# Patient Record
Sex: Female | Born: 1955 | Race: White | Hispanic: No | Smoking: Former smoker
Health system: Southern US, Community
[De-identification: ages and names within clinical notes are randomized; demographics above are authoritative.]

## PROBLEM LIST (undated history)

## (undated) DIAGNOSIS — E079 Disorder of thyroid, unspecified: Secondary | ICD-10-CM

## (undated) DIAGNOSIS — M549 Dorsalgia, unspecified: Secondary | ICD-10-CM

## (undated) DIAGNOSIS — F319 Bipolar disorder, unspecified: Secondary | ICD-10-CM

## (undated) DIAGNOSIS — G2581 Restless legs syndrome: Secondary | ICD-10-CM

## (undated) DIAGNOSIS — F209 Schizophrenia, unspecified: Secondary | ICD-10-CM

## (undated) DIAGNOSIS — T7840XA Allergy, unspecified, initial encounter: Secondary | ICD-10-CM

## (undated) DIAGNOSIS — G8929 Other chronic pain: Secondary | ICD-10-CM

## (undated) DIAGNOSIS — Z9889 Other specified postprocedural states: Secondary | ICD-10-CM

## (undated) DIAGNOSIS — E785 Hyperlipidemia, unspecified: Secondary | ICD-10-CM

## (undated) DIAGNOSIS — G4700 Insomnia, unspecified: Secondary | ICD-10-CM

## (undated) DIAGNOSIS — G709 Myoneural disorder, unspecified: Secondary | ICD-10-CM

## (undated) DIAGNOSIS — R569 Unspecified convulsions: Secondary | ICD-10-CM

## (undated) DIAGNOSIS — F329 Major depressive disorder, single episode, unspecified: Secondary | ICD-10-CM

## (undated) DIAGNOSIS — J189 Pneumonia, unspecified organism: Secondary | ICD-10-CM

## (undated) DIAGNOSIS — I10 Essential (primary) hypertension: Secondary | ICD-10-CM

## (undated) DIAGNOSIS — M1990 Unspecified osteoarthritis, unspecified site: Secondary | ICD-10-CM

## (undated) DIAGNOSIS — R609 Edema, unspecified: Secondary | ICD-10-CM

## (undated) DIAGNOSIS — T4271XA Poisoning by unspecified antiepileptic and sedative-hypnotic drugs, accidental (unintentional), initial encounter: Secondary | ICD-10-CM

## (undated) DIAGNOSIS — F32A Depression, unspecified: Secondary | ICD-10-CM

## (undated) DIAGNOSIS — R0600 Dyspnea, unspecified: Secondary | ICD-10-CM

## (undated) DIAGNOSIS — R112 Nausea with vomiting, unspecified: Secondary | ICD-10-CM

## (undated) DIAGNOSIS — F410 Panic disorder [episodic paroxysmal anxiety] without agoraphobia: Secondary | ICD-10-CM

## (undated) DIAGNOSIS — D649 Anemia, unspecified: Secondary | ICD-10-CM

## (undated) DIAGNOSIS — G4730 Sleep apnea, unspecified: Secondary | ICD-10-CM

## (undated) HISTORY — DX: Poisoning by unspecified antiepileptic and sedative-hypnotic drugs, accidental (unintentional), initial encounter: T42.71XA

## (undated) HISTORY — PX: SPINE SURGERY: SHX786

## (undated) HISTORY — PX: FRACTURE SURGERY: SHX138

## (undated) HISTORY — DX: Hyperlipidemia, unspecified: E78.5

## (undated) HISTORY — DX: Dorsalgia, unspecified: M54.9

## (undated) HISTORY — PX: OTHER SURGICAL HISTORY: SHX169

## (undated) HISTORY — DX: Disorder of thyroid, unspecified: E07.9

## (undated) HISTORY — DX: Edema, unspecified: R60.9

## (undated) HISTORY — DX: Anemia, unspecified: D64.9

## (undated) HISTORY — DX: Morbid (severe) obesity due to excess calories: E66.01

## (undated) HISTORY — DX: Major depressive disorder, single episode, unspecified: F32.9

## (undated) HISTORY — DX: Unspecified convulsions: R56.9

## (undated) HISTORY — PX: MINOR HEMORRHOIDECTOMY: SHX6238

## (undated) HISTORY — DX: Panic disorder (episodic paroxysmal anxiety): F41.0

## (undated) HISTORY — DX: Myoneural disorder, unspecified: G70.9

## (undated) HISTORY — PX: BACK SURGERY: SHX140

## (undated) HISTORY — PX: TUBAL LIGATION: SHX77

## (undated) HISTORY — DX: Restless legs syndrome: G25.81

## (undated) HISTORY — DX: Allergy, unspecified, initial encounter: T78.40XA

## (undated) HISTORY — DX: Essential (primary) hypertension: I10

## (undated) HISTORY — DX: Insomnia, unspecified: G47.00

## (undated) HISTORY — DX: Other chronic pain: G89.29

## (undated) HISTORY — DX: Depression, unspecified: F32.A

---

## 1998-07-31 ENCOUNTER — Other Ambulatory Visit: Admission: RE | Admit: 1998-07-31 | Discharge: 1998-07-31 | Payer: Self-pay

## 2001-03-08 ENCOUNTER — Encounter: Payer: Self-pay | Admitting: Emergency Medicine

## 2001-03-08 ENCOUNTER — Emergency Department (HOSPITAL_COMMUNITY): Admission: EM | Admit: 2001-03-08 | Discharge: 2001-03-08 | Payer: Self-pay | Admitting: Emergency Medicine

## 2001-07-07 ENCOUNTER — Other Ambulatory Visit: Admission: RE | Admit: 2001-07-07 | Discharge: 2001-07-07 | Payer: Self-pay | Admitting: Family Medicine

## 2002-11-16 ENCOUNTER — Other Ambulatory Visit: Admission: RE | Admit: 2002-11-16 | Discharge: 2002-11-16 | Payer: Self-pay | Admitting: Family Medicine

## 2003-11-29 ENCOUNTER — Other Ambulatory Visit: Admission: RE | Admit: 2003-11-29 | Discharge: 2003-11-29 | Payer: Self-pay | Admitting: Family Medicine

## 2004-02-08 ENCOUNTER — Ambulatory Visit (HOSPITAL_COMMUNITY): Admission: RE | Admit: 2004-02-08 | Discharge: 2004-02-08 | Payer: Self-pay | Admitting: Gastroenterology

## 2004-02-08 ENCOUNTER — Encounter (INDEPENDENT_AMBULATORY_CARE_PROVIDER_SITE_OTHER): Payer: Self-pay | Admitting: *Deleted

## 2004-12-25 ENCOUNTER — Ambulatory Visit (HOSPITAL_COMMUNITY): Admission: RE | Admit: 2004-12-25 | Discharge: 2004-12-25 | Payer: Self-pay | Admitting: Family Medicine

## 2005-03-26 ENCOUNTER — Encounter (HOSPITAL_COMMUNITY): Admission: RE | Admit: 2005-03-26 | Discharge: 2005-04-25 | Payer: Self-pay | Admitting: Family Medicine

## 2005-04-02 ENCOUNTER — Other Ambulatory Visit: Admission: RE | Admit: 2005-04-02 | Discharge: 2005-04-02 | Payer: Self-pay | Admitting: Family Medicine

## 2005-06-12 ENCOUNTER — Ambulatory Visit: Payer: Self-pay | Admitting: Cardiology

## 2005-06-20 ENCOUNTER — Ambulatory Visit: Payer: Self-pay | Admitting: Cardiology

## 2006-01-17 ENCOUNTER — Ambulatory Visit: Payer: Self-pay | Admitting: Cardiology

## 2006-02-07 ENCOUNTER — Ambulatory Visit: Payer: Self-pay | Admitting: Cardiology

## 2006-05-27 ENCOUNTER — Ambulatory Visit: Payer: Self-pay

## 2006-05-27 ENCOUNTER — Ambulatory Visit: Payer: Self-pay | Admitting: Nurse Practitioner

## 2006-05-27 ENCOUNTER — Encounter: Payer: Self-pay | Admitting: Cardiology

## 2006-07-21 ENCOUNTER — Ambulatory Visit: Payer: Self-pay | Admitting: Oncology

## 2008-10-13 ENCOUNTER — Emergency Department (HOSPITAL_COMMUNITY): Admission: EM | Admit: 2008-10-13 | Discharge: 2008-10-13 | Payer: Self-pay | Admitting: Emergency Medicine

## 2008-10-17 ENCOUNTER — Ambulatory Visit (HOSPITAL_COMMUNITY): Admission: RE | Admit: 2008-10-17 | Discharge: 2008-10-17 | Payer: Self-pay | Admitting: Family Medicine

## 2008-10-21 ENCOUNTER — Ambulatory Visit (HOSPITAL_COMMUNITY): Admission: RE | Admit: 2008-10-21 | Discharge: 2008-10-21 | Payer: Self-pay | Admitting: Family Medicine

## 2008-10-21 ENCOUNTER — Encounter (INDEPENDENT_AMBULATORY_CARE_PROVIDER_SITE_OTHER): Payer: Self-pay | Admitting: Diagnostic Radiology

## 2008-10-26 ENCOUNTER — Ambulatory Visit (HOSPITAL_COMMUNITY): Admission: RE | Admit: 2008-10-26 | Discharge: 2008-10-26 | Payer: Self-pay | Admitting: Family Medicine

## 2008-11-16 ENCOUNTER — Encounter: Admission: RE | Admit: 2008-11-16 | Discharge: 2008-11-16 | Payer: Self-pay | Admitting: Neurosurgery

## 2009-02-04 HISTORY — PX: COLONOSCOPY: SHX174

## 2009-04-11 ENCOUNTER — Encounter (INDEPENDENT_AMBULATORY_CARE_PROVIDER_SITE_OTHER): Payer: Self-pay | Admitting: *Deleted

## 2009-06-29 ENCOUNTER — Ambulatory Visit: Payer: Self-pay | Admitting: Gastroenterology

## 2009-06-29 ENCOUNTER — Encounter (INDEPENDENT_AMBULATORY_CARE_PROVIDER_SITE_OTHER): Payer: Self-pay | Admitting: *Deleted

## 2009-06-29 DIAGNOSIS — R197 Diarrhea, unspecified: Secondary | ICD-10-CM

## 2009-06-29 DIAGNOSIS — E669 Obesity, unspecified: Secondary | ICD-10-CM

## 2009-06-29 DIAGNOSIS — Z860100 Personal history of colon polyps, unspecified: Secondary | ICD-10-CM | POA: Insufficient documentation

## 2009-07-05 HISTORY — PX: OTHER SURGICAL HISTORY: SHX169

## 2009-07-06 DIAGNOSIS — D519 Vitamin B12 deficiency anemia, unspecified: Secondary | ICD-10-CM

## 2009-07-06 LAB — CONVERTED CEMR LAB
ALT: 38 units/L — ABNORMAL HIGH (ref 0–35)
AST: 44 units/L — ABNORMAL HIGH (ref 0–37)
BUN: 20 mg/dL (ref 6–23)
Basophils Absolute: 0 10*3/uL (ref 0.0–0.1)
Basophils Relative: 0.7 % (ref 0.0–3.0)
Bilirubin, Direct: 0.1 mg/dL (ref 0.0–0.3)
Chloride: 104 meq/L (ref 96–112)
Creatinine, Ser: 0.6 mg/dL (ref 0.4–1.2)
Eosinophils Absolute: 0.1 10*3/uL (ref 0.0–0.7)
Folate: 5.7 ng/mL
GFR calc non Af Amer: 110.12 mL/min (ref >60)
Iron: 108 ug/dL (ref 42–145)
Lymphocytes Relative: 33.7 % (ref 12.0–46.0)
MCHC: 34.2 g/dL (ref 30.0–36.0)
MCV: 91.5 fL (ref 78.0–100.0)
Monocytes Absolute: 0.4 10*3/uL (ref 0.1–1.0)
Neutrophils Relative %: 56.1 % (ref 43.0–77.0)
Potassium: 3.9 meq/L (ref 3.5–5.1)
RDW: 13.3 % (ref 11.5–14.6)
TSH: 1.44 microintl units/mL (ref 0.35–5.50)
Total Bilirubin: 0.6 mg/dL (ref 0.3–1.2)
Transferrin: 226.3 mg/dL (ref 212.0–360.0)
Vitamin B-12: 126 pg/mL — ABNORMAL LOW (ref 211–911)

## 2009-07-07 ENCOUNTER — Telehealth (INDEPENDENT_AMBULATORY_CARE_PROVIDER_SITE_OTHER): Payer: Self-pay | Admitting: *Deleted

## 2009-07-10 ENCOUNTER — Telehealth: Payer: Self-pay | Admitting: Gastroenterology

## 2009-07-12 ENCOUNTER — Ambulatory Visit: Payer: Self-pay | Admitting: Gastroenterology

## 2009-07-14 ENCOUNTER — Encounter: Payer: Self-pay | Admitting: Gastroenterology

## 2010-03-06 NOTE — Procedures (Signed)
Summary: Colonoscopy  Patient: Tamara Mccullough Note: All result statuses are Final unless otherwise noted.  Tests: (1) Colonoscopy (COL)   COL Colonoscopy           DONE     [facility]     [address]     [place], [place]  [address]           COLONOSCOPY PROCEDURE REPORT           PATIENT:  [name], [name]  MR#:  [id number]     BIRTHDATE:  [birth year], 53 yrs. old  GENDER:  female     ENDOSCOPIST:  Vania Rea. Jarold Motto, MD, [facility]     REF. BY:  Paulita Cradle, N.P.     PROCEDURE DATE:  07/12/2009     PROCEDURE:  Colonoscopy with biopsy     ASA CLASS:  Class II     INDICATIONS:  surveillance, history of polyps     MEDICATIONS:   Fentanyl 100 mcg IV, Versed 10 mg IV           DESCRIPTION OF PROCEDURE:   After the risks benefits and     alternatives of the procedure were thoroughly explained, informed     consent was obtained.  Digital rectal exam was performed and     revealed no abnormalities.   The [id number] endoscope was     introduced through the anus and advanced to the cecum, which was     identified by both the appendix and ileocecal valve, without     limitations.  The quality of the prep was excellent, using     MoviPrep.  The instrument was then slowly withdrawn as the colon     was fully examined.     <<PROCEDUREIMAGES>>           FINDINGS:  A diminutive polyp was found in the rectum. COLD BIOPSY     REMOVED.  This was otherwise a normal examination of the colon.     Retroflexed views in the rectum revealed no abnormalities.    The     scope was then withdrawn from the patient and the procedure     completed.           COMPLICATIONS:  None     ENDOSCOPIC IMPRESSION:     1) Diminutive polyp in the rectum     2) Otherwise normal examination     R/O ADENOMA.     RECOMMENDATIONS:     1) Follow up colonoscopy in 5 years     REPEAT EXAM:  No           ______________________________     Vania Rea. Jarold Motto, MD, [facility]           CC:           n.  eSIGNED:   Vania Rea. Patterson at 07/12/2009 09:57 AM           Raynelle Fanning, [id number]  Note: An exclamation mark (!) indicates a result that was not dispersed into the flowsheet. Document Creation Date: 07/12/2009 9:58 AM _______________________________________________________________________  (1) Order result status: Final Collection or observation date-time: 07/12/2009 09:50 Requested date-time:  Receipt date-time:  Reported date-time:  Referring Physician:   Ordering Physician: Sheryn Bison [phone]) Specimen Source:  Source: Launa Grill Order Number: [id number] Lab site:   Appended Document: Colonoscopy followup colonoscopy in 5 years  Appended Document: Colonoscopy     Procedures Next Due Date:    Colonoscopy: 07/2014

## 2010-03-06 NOTE — Progress Notes (Signed)
Summary: Forgot to do Colon Health  Phone Note Call from Patient Call back at Home Phone [phone]   Call For: Dr Jarold Motto Reason for Call: Talk to Nurse Summary of Call: Forgot to do the [facility] colon health 2 times a day is that ok to still have Colon wed 07-12-09.   Initial call taken by: Leanor Kail [facility],  July 10, 2009 11:36 AM  Follow-up for Phone Call        Pt informed that it is OK to have colonoscopy.  She can begin [facility] after proc is done. Follow-up by: Ashok Cordia RN,  July 10, 2009 11:50 AM

## 2010-03-06 NOTE — Assessment & Plan Note (Signed)
Summary: CHANGE IN BM...AS.   History of Present Illness Visit Type: Initial Visit Primary GI MD: Sheryn Bison MD FACP FAGA Primary Provider: Paulita Cradle, MD Requesting Provider: Rudi Heap, MD Chief Complaint: changes in bowels  alternating constipation and diarrhea History of Present Illness:   Pleasant 55 year old Caucasian female with a long history of morbid obesity and recurrent colon polyps. 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 These records are not available for review. She has lost 190 pounds in weight, apparently is stable at this point. 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 She is a very irregular diet with lots of junk food, but denies any specific food intolerances at this time.  Her main complaint today is one of constipation requiring glycerin suppository use. She's had no rectal bleeding, melena, or hepatobiliary complaints. She does not think she had cholecystectomy. She denies a systemic complaints such as fever, chills, or any evidence of nutritional deficiencies, and is on multiple vitamins. She denies reflux symptoms, dysphagia, or history of pancreatitis, but apparently has fatty liver and elevated hepatic enzymes. She also suffers from hypertension, insomnia, and chronic pain syndrome.   GI Review of Systems      Denies abdominal pain, acid reflux, belching, bloating, chest pain, dysphagia with liquids, dysphagia with solids, heartburn, loss of appetite, nausea, vomiting, vomiting blood, weight loss, and  weight gain.      Reports change in bowel habits, constipation, and  diarrhea.     Denies anal fissure, black tarry stools, diverticulosis, fecal incontinence, heme positive stool, hemorrhoids, irritable bowel syndrome, jaundice, light color stool, liver problems, rectal bleeding, and  rectal pain. Preventive Screening-Counseling &  Management  Alcohol-Tobacco     Smoking Status: quit      Drug Use:  no.      Current Medications (verified): 1)  Fluoxetine Hcl 20 Mg Caps (Fluoxetine Hcl) .Marland Kitchen.. 1 By Mouth Once Daily 2)  Hydroxyzine Pamoate 50 Mg Caps (Hydroxyzine Pamoate) .... 2 By Mouth At Bedtime 3)  Zolpidem Tartrate 10 Mg Tabs (Zolpidem Tartrate) .Marland Kitchen.. 1 By Mouth At Bedtime 4)  Lisinopril-Hydrochlorothiazide 10-12.5 Mg Tabs (Lisinopril-Hydrochlorothiazide) .Marland Kitchen.. 1 By Mouth Once Daily 5)  Daily Multiple Vitamins  Tabs (Multiple Vitamin) .Marland Kitchen.. 1 By Mouth Once Daily 6)  Cal-Citrate 150 Mg Caps (Calcium Citrate) .Marland Kitchen.. 1 By Mouth Three Times A Day 7)  Tramadol Hcl 50 Mg Tabs (Tramadol Hcl) .Marland Kitchen.. 1 By Mouth Three Times A Day  Allergies (verified): 1)  ! * Alcohol 2)  ! Codeine  Past History:  Past medical, surgical, family and social histories (including risk factors) reviewed for relevance to current acute and chronic problems.  Past Medical History: Hypertension Chronic Back Pain Hypoglycemia Alcoholism Anxiety Disorder Asthma Adenomatous Colon Polyps Depression Obesity Sleep Apnea  Past Surgical History: Reviewed history from 06/26/2009 and no changes required. Back Surgery Gastric Bypass Tubal Ligation Hemorrhoidectomy  Family History: Reviewed history from 06/26/2009 and no changes required. Family History of Heart Disease: father Family History of Diabetes:  father No FH of Colon Cancer:  Social History: Reviewed history from 06/26/2009 and no changes required. Alcohol Use - no Illicit Drug Use - no Patient is a former smoker.  Retired  Divorced 1 girl Daily Caffeine Use Smoking Status:  quit  Review of Systems       The patient complains of anxiety-new, back pain, depression-new, fatigue, itching, night sweats, and sleeping problems.  The patient denies  allergy/sinus, anemia, arthritis/joint pain, blood in urine, breast changes/lumps, change in vision, confusion, cough, coughing up  blood, fever, headaches-new, hearing problems, heart murmur, heart rhythm changes, menstrual pain, muscle pains/cramps, nosebleeds, pregnancy symptoms, shortness of breath, skin rash, sore throat, swelling of feet/legs, swollen lymph glands, thirst - excessive , urination - excessive , urination changes/pain, urine leakage, vision changes, and voice change.   General:  Complains of fatigue and sleep disorder. Eyes:  Denies blurring, diplopia, irritation, discharge, vision loss, scotoma, eye pain, and photophobia. ENT:  Denies earache, ear discharge, tinnitus, decreased hearing, nasal congestion, loss of smell, nosebleeds, sore throat, hoarseness, and difficulty swallowing. CV:  Denies chest pains, angina, palpitations, syncope, dyspnea on exertion, orthopnea, PND, peripheral edema, and claudication. Resp:  Complains of dyspnea with exercise; denies dyspnea at rest, cough, sputum, wheezing, coughing up blood, and pleurisy. GI:  Complains of gas/bloating, diarrhea, and constipation; denies difficulty swallowing, pain on swallowing, nausea, indigestion/heartburn, vomiting, vomiting blood, abdominal pain, jaundice, change in bowel habits, bloody BM's, black BMs, and fecal incontinence. GU:  Denies urinary burning, blood in urine, nocturnal urination, urinary frequency, urinary incontinence, abnormal vaginal bleeding, amenorrhea, menorrhagia, vaginal discharge, pelvic pain, genital sores, painful intercourse, and decreased libido. MS:  Complains of low back pain; denies joint pain / LOM, joint swelling, joint stiffness, joint deformity, muscle weakness, muscle cramps, muscle atrophy, leg pain at night, leg pain with exertion, and shoulder pain / LOM hand / wrist pain (CTS). Derm:  Complains of itching; denies rash, dry skin, hives, moles, warts, and unhealing ulcers. Neuro:  Denies weakness, paralysis, abnormal sensation, seizures, syncope, tremors, vertigo, transient blindness, frequent falls, frequent  headaches, difficulty walking, headache, sciatica, radiculopathy other:, restless legs, memory loss, and confusion.  Vital Signs:  Patient profile:   55 year old female Height:      65 inches Weight:      196 pounds BMI:     32.73 Pulse rate:   60 / minute Pulse rhythm:   regular BP sitting:   102 / 70  (left arm)  Vitals Entered By: Milford Cage NCMA (Jun 29, 2009 9:26 AM)  Physical Exam  General:  Well developed, well nourished, no acute distress.obese.   Head:  Normocephalic and atraumatic. Eyes:  PERRLA, no icterus.exam deferred to patient's ophthalmologist.   Neck:  Supple; no masses or thyromegaly. Lungs:  Clear throughout to auscultation. Heart:  Regular rate and rhythm; no murmurs, rubs,  or bruits. Abdomen:  Soft, nontender and nondistended. No masses, hepatosplenomegaly or hernias noted. Normal bowel sounds. Rectal:  Normal exam.hemocult negative.   Msk:  Symmetrical with no gross deformities. Normal posture. Extremities:  No clubbing, cyanosis, edema or deformities noted. Neurologic:  Alert and  oriented x4;  grossly normal neurologically. Cervical Nodes:  No significant cervical adenopathy. Psych:  Alert and cooperative. Normal mood and affect.   Impression & Recommendations:  Problem # 1:  DIARRHEA (ICD-787.91) Assessment Unchanged Nonspecific bowel irregularity and obese patient status post gastric bypass and Roux-en-Y biliary-pancreatic diversion. Obviously her gut absorption and digestion is very complex related to these anatomical changes. Actually, I think she is doing extremely well at this point and all that she needs is more fiber in her diet which I have prescribed p.r.n. MiraLax use. Some screening lab parameters have been ordered and we will schedule her for followup colonoscopy because of her history of colon polyps. She appears well-nourished at this time and apparently is on multivitamins which have asked her to bring for review. Orders: TLB-CBC  Platelet - w/Differential (85025-CBCD) TLB-BMP (Basic Metabolic Panel-BMET) (80048-METABOL) TLB-Hepatic/Liver Function Pnl (80076-HEPATIC) TLB-TSH (Thyroid Stimulating Hormone) (84443-TSH) TLB-B12, Serum-Total ONLY (52841-L24) TLB-Ferritin (82728-FER) TLB-Folic Acid (Folate) (82746-FOL) TLB-IBC Pnl (Iron/FE;Transferrin) (83550-IBC) TLB-Magnesium (Mg) (83735-MG) T- * Misc. Laboratory test [id number])  Problem # 2:  OBESITY (ICD-278.00) Assessment: Improved Liver functions ordered to assess possible Nash syndrome.  Problem # 3:  PERSONAL HX COLONIC POLYPS (ICD-V12.72) Assessment: Unchanged Colonoscopy scheduled her convenience.  Patient Instructions: 1)  You are scheduled for a colonoscopy. 2)  Begin [facility] two times a day. 3)  Begin Miralax as needed. 4)  Please go to the basement for lab work. 5)  The medication list was reviewed and reconciled.  All changed / newly prescribed medications were explained.  A complete medication list was provided to the patient / caregiver. 6)  Copy sent to : Albertina Senegal 7)  Please continue current medications.  8)  Constipation and Hemorrhoids brochure given.  9)  Colonoscopy and Flexible Sigmoidoscopy brochure given.  10)  Conscious Sedation brochure given.   Appended Document: CHANGE IN BM...AS.    Clinical Lists Changes  Medications: Added new medication of MOVIPREP 100 GM  SOLR (PEG-KCL-NACL-NASULF-NA ASC-C) As per prep instructions. - Signed Added new medication of PHILLIPS COLON HEALTH  CAPS (PROBIOTIC PRODUCT) two times a day Added new medication of MIRALAX   POWD (POLYETHYLENE GLYCOL 3350) daily Rx of MOVIPREP 100 GM  SOLR (PEG-KCL-NACL-NASULF-NA ASC-C) As per prep instructions.;  #1 x 0;  Signed;  Entered by: Ashok Cordia RN;  Authorized by: Mardella Layman MD [facility];  Method used: Print then Give to Patient Orders: Added new Test order of Colonoscopy (Colon) - Signed    Prescriptions: MOVIPREP 100 GM  SOLR  (PEG-KCL-NACL-NASULF-NA ASC-C) As per prep instructions.  #1 x 0   Entered by:   Ashok Cordia RN   Authorized by:   Mardella Layman MD [facility]   Signed by:   Ashok Cordia RN on 06/29/2009   Method used:   Print then Give to Patient   RxID:   [id number]    Appended Document: CHANGE IN BM...AS.    Clinical Lists Changes  Medications: Rx of MOVIPREP 100 GM  SOLR (PEG-KCL-NACL-NASULF-NA ASC-C) As per prep instructions.;  #1 x 0;  Signed;  Entered by: Ashok Cordia RN;  Authorized by: Mardella Layman MD [facility];  Method used: Faxed to [facility], [phone] [place], [place], [place]  [address], Ph: [phone] or [phone], Fax: [phone]    Prescriptions: MOVIPREP 100 GM  SOLR (PEG-KCL-NACL-NASULF-NA ASC-C) As per prep instructions.  #1 x 0   Entered by:   Ashok Cordia RN   Authorized by:   Mardella Layman MD [facility]   Signed by:   Ashok Cordia RN on 06/29/2009   Method used:   Faxed to ...       Hospital doctor (retail)       [address]       [place], [place]  [address]       Ph: [phone] or [phone]       Fax: [phone]   RxID:   [id number]    Appended Document: CHANGE IN BM...AS. please ask her to come in and check serum copper level. Other labs were unremarkable.  Appended Document: CHANGE IN BM...AS. No answer.  See message re B12.    Appended Document: CHANGE IN BM...AS. Pt notified.  Will come by next week for lab.

## 2010-03-06 NOTE — Letter (Signed)
Summary: New Patient letter  [facility]  [address] [place], [place] [address]   Phone: [phone]  Fax: [phone]       04/11/2009 MRN: [id number]  [facility] [address] [place], [place]  [address]  Dear Ms. Mustard,  Welcome to the Gastroenterology Division at [facility].    You are scheduled to see Dr.  Jarold Motto on 05/09/2009 at 10:00AM on the 3rd floor at [facility], [address]  We ask that you try to arrive at our office 15 minutes prior to your appointment time to allow for check-in.  We would like you to complete the enclosed self-administered evaluation form prior to your visit and bring it with you on the day of your appointment.  We will review it with you.  Also, please bring a complete list of all your medications or, if you prefer, bring the medication bottles and we will list them.  Please bring your insurance card so that we may make a copy of it.  If your insurance requires a referral to see a specialist, please bring your referral form from your primary care physician.  Co-payments are due at the time of your visit and may be paid by cash, check or credit card.     Your office visit will consist of a consult with your physician (includes a physical exam), any laboratory testing he/she may order, scheduling of any necessary diagnostic testing (e.g. x-ray, ultrasound, CT-scan), and scheduling of a procedure (e.g. Endoscopy, Colonoscopy) if required.  Please allow enough time on your schedule to allow for any/all of these possibilities.    If you cannot keep your appointment, please call [phone] to cancel or reschedule prior to your appointment date.  This allows [place] the opportunity to schedule an appointment for another patient in need of care.  If you do not cancel or reschedule by 5 p.m. the business day prior to your appointment date, you will be charged a $50.00 late cancellation/no-show fee.    Thank you for choosing  [facility] for your medical needs.  We appreciate the opportunity to care for you.  Please visit [place] at our website  to learn more about our practice.                     Sincerely,                                                             The Gastroenterology Division

## 2010-03-06 NOTE — Procedures (Signed)
Summary: Colon   Colonoscopy  Procedure date:  02/08/2004  Findings:      Location:  [facility].   NAME:  Tamara Mccullough, Tamara Mccullough             ACCOUNT NO.:  [id number]   MEDICAL RECORD NO.:  [id number]          PATIENT TYPE:  AMB   LOCATION:  ENDO                         FACILITY[facility]  [facility]   PHYSICIAN:  John C. Madilyn Fireman, M.D.    DATE OF BIRTH:  [birth year]   DATE OF PROCEDURE:  02/08/2004  DATE OF DISCHARGE:                                 OPERATIVE REPORT   PROCEDURE:  Colonoscopy.   INDICATIONS FOR PROCEDURE:  Average risk colon cancer screening.   DESCRIPTION OF PROCEDURE:  The patient was placed in the left lateral  decubitus position and placed on the pulse monitor with continuous low-flow  oxygen delivered by nasal cannula.  She was sedated with 125 mcg IV fentanyl  and 10 mg IV Versed and 12.5 mg IV Phenergan.  The Olympus video colonoscope  was inserted into the rectum and advanced to the cecum, confirmed by  transillumination of McBurney's point and visualization of the ileocecal  valve and appendiceal orifice.  Prep was suboptimal.  I could not rule out  small lesions of less than 1 cm in all areas.  Otherwise the cecum,  ascending, transverse, descending, and sigmoid colon all appeared normal  with no masses, polyps, diverticula, or other mucosal abnormalities.  The  rectum likewise appeared normal and retroflexed view of the anus revealed  small internal hemorrhoids.  The scope was then withdrawn and the patient  returned to the recovery room in stable condition.  She tolerated the  procedure well and there were no immediate complications.   IMPRESSION:  Internal hemorrhoids. Otherwise normal colonoscopy.   PLAN:  The next colon screening by sigmoidoscopy in five years.      J[facility]/MEDQ  D:  02/08/2004  T:  02/08/2004  Job:  [id number]   cc:   Ernestina Penna, M.D.  [address] [place]  [place] [address]  Fax: [phone]   This report was created from the  original endoscopy report, which was reviewed and signed by the above listed endoscopist.

## 2010-03-06 NOTE — Progress Notes (Signed)
Summary: B12 injections  Phone Note Call from Patient   Caller: Patient Summary of Call: Pt calling.  Would like to get B12 shots done at [facility],  Dr. Gayleen Orem office.  This is closer to her. Initial call taken by: Ashok Cordia RN,  July 07, 2009 12:11 PM  Follow-up for Phone Call        Note faxed to [facility] re B12 injections.  Pt will have lab drawn when she comes in for procedure. Follow-up by: Ashok Cordia RN,  July 07, 2009 12:53 PM     Appended Document: B12 injections SHE CAN GET HER B12 SHOTS IN [place]...PLEASE CALL THEM....  Appended Document: B12 injections Notes have been faxed to [employer] [facility].  LM for pt to call back if she has not started on the B12 shots.

## 2010-03-06 NOTE — Letter (Signed)
Summary: [facility] Instructions  [facility]  [address] [address] [place], [place] [address]   Phone: [phone]  Fax: [phone]       Tamara Mccullough    1955-10-31    MRN: [id number]        Procedure Day Dorna Bloom: Wednesday, 07/12/09     Arrival Time: 9:00      Procedure Time: 10:00     Location of Procedure:                    _X _  [facility] (4th Floor)                        PREPARATION FOR COLONOSCOPY WITH MOVIPREP   Starting 5 days prior to your procedure 07/07/09 do not eat nuts, seeds, popcorn, corn, beans, peas,  salads, or any raw vegetables.  Do not take any fiber supplements (e.g. Metamucil, Citrucel, and Benefiber).  THE DAY BEFORE YOUR PROCEDURE         DATE: 07/11/09   DAY: Tuesday  1.  Drink clear liquids the entire day-NO SOLID FOOD  2.  Do not drink anything colored red or purple.  Avoid juices with pulp.  No orange juice.  3.  Drink at least 64 oz. (8 glasses) of fluid/clear liquids during the day to prevent dehydration and help the prep work efficiently.  CLEAR LIQUIDS INCLUDE: Water Jello Ice Popsicles Tea (sugar ok, no milk/cream) Powdered fruit flavored drinks Coffee (sugar ok, no milk/cream) Gatorade Juice: apple, white grape, white cranberry  Lemonade Clear bullion, consomm, broth Carbonated beverages (any kind) Strained chicken noodle soup Hard Candy                             4.  In the morning, mix first dose of MoviPrep solution:    Empty 1 Pouch A and 1 Pouch B into the disposable container    Add lukewarm drinking water to the top line of the container. Mix to dissolve    Refrigerate (mixed solution should be used within 24 hrs)  5.  Begin drinking the prep at 5:00 p.m. The MoviPrep container is divided by 4 marks.   Every 15 minutes drink the solution down to the next mark (approximately 8 oz) until the full liter is complete.   6.  Follow completed prep with 16 oz of clear liquid of your choice (Nothing  red or purple).  Continue to drink clear liquids until bedtime.  7.  Before going to bed, mix second dose of MoviPrep solution:    Empty 1 Pouch A and 1 Pouch B into the disposable container    Add lukewarm drinking water to the top line of the container. Mix to dissolve    Refrigerate  THE DAY OF YOUR PROCEDURE      DATE: 07/12/09   DAY: Wednesday  Beginning at 5:00 a.m. (5 hours before procedure):         1. Every 15 minutes, drink the solution down to the next mark (approx 8 oz) until the full liter is complete.  2. Follow completed prep with 16 oz. of clear liquid of your choice.    3. You may drink clear liquids until 8:00 (2 HOURS BEFORE PROCEDURE).   MEDICATION INSTRUCTIONS  Unless otherwise instructed, you should take regular prescription medications with a small sip of water   as early as possible the morning  of your procedure.                OTHER INSTRUCTIONS  You will need a responsible adult at least 57 years of age to accompany you and drive you home.   This person must remain in the waiting room during your procedure.  Wear loose fitting clothing that is easily removed.  Leave jewelry and other valuables at home.  However, you may wish to bring a book to read or  an iPod/MP3 player to listen to music as you wait for your procedure to start.  Remove all body piercing jewelry and leave at home.  Total time from sign-in until discharge is approximately 2-3 hours.  You should go home directly after your procedure and rest.  You can resume normal activities the  day after your procedure.  The day of your procedure you should not:   Drive   Make legal decisions   Operate machinery   Drink alcohol   Return to work  You will receive specific instructions about eating, activities and medications before you leave.    The above instructions have been reviewed and explained to me by   _______________________    I fully understand and can  verbalize these instructions _____________________________ Date _________

## 2010-03-06 NOTE — Letter (Signed)
Summary: Patient Notice- Polyp Results  [facility]  [address] [place], [place] [address]   Phone: [phone]  Fax: [phone]        July 14, 2009 MRN: [id number]    [facility] [address] [place], [place]  [address]    Dear Ms. Outman,  I am pleased to inform you that the colon polyp(s) removed during your recent colonoscopy was (were) found to be benign (no cancer detected) upon pathologic examination.  I recommend you have a repeat colonoscopy examination in 5_ years to look for recurrent polyps, as having colon polyps increases your risk for having recurrent polyps or even colon cancer in the future.  Should you develop new or worsening symptoms of abdominal pain, bowel habit changes or bleeding from the rectum or bowels, please schedule an evaluation with either your primary care physician or with me.  Additional information/recommendations:  __ No further action with gastroenterology is needed at this time. Please      follow-up with your primary care physician for your other healthcare      needs.  __ Please call [phone] to schedule a return visit to review your      situation.  __ Please keep your follow-up visit as already scheduled.  xx__ Continue treatment plan as outlined the day of your exam.  Please call us if you are having persistent problems or have questions about your condition that have not been fully answered at this time.  Sincerely,  Mardella Layman MD [facility]  This letter has been electronically signed by your physician.  Appended Document: Patient Notice- Polyp Results letter mailed.

## 2010-05-11 LAB — CBC
HCT: 41.6 % (ref 36.0–46.0)
MCV: 92.2 fL (ref 78.0–100.0)
Platelets: 163 10*3/uL (ref 150–400)
RDW: 12.9 % (ref 11.5–15.5)

## 2010-05-11 LAB — DIFFERENTIAL
Basophils Absolute: 0 10*3/uL (ref 0.0–0.1)
Basophils Relative: 1 % (ref 0–1)
Eosinophils Absolute: 0.1 10*3/uL (ref 0.0–0.7)
Eosinophils Relative: 2 % (ref 0–5)
Neutrophils Relative %: 69 % (ref 43–77)

## 2010-05-11 LAB — POCT I-STAT, CHEM 8
HCT: 44 % (ref 36.0–46.0)
Hemoglobin: 15 g/dL (ref 12.0–15.0)
Potassium: 3.2 mEq/L — ABNORMAL LOW (ref 3.5–5.1)
Sodium: 141 mEq/L (ref 135–145)
TCO2: 28 mmol/L (ref 0–100)

## 2010-06-22 NOTE — Assessment & Plan Note (Signed)
[facility]                          [facility] OFFICE NOTE   NAME:Tamara Mccullough, Tamara Mccullough                    MRN:          [id number]  DATE:05/27/2006                            DOB:          1955-10-22    Tamara Mccullough returns today for follow-up regarding pending of gastric  bypass to be done at [facility] in the near future.  She was last seen in  December 2007, by Tamara Mccullough who felt patient was stable from a cardiac  perspective at that time to proceed with her bariatric surgery.  Ms.  Mccullough underwent a dobutamine stress echocardiogram  in January of this  year that was negative for ischemia by clinical electrocardiographic and  echocardiographic criteria.  Baseline echo data demonstrated normal LV  size and contraction with no segmental wall motion abnormalities.  Blood  pressure stable at 125/65.  Tamara Mccullough returns today stating that she  needs to have a routine 2-D echocardiogram  done also for preoperative  clearance for her bariatric surgery.  Tamara Mccullough states she has done  quite well since we saw her in December.  No chest discomfort.  She has  chronic dyspnea on exertion which is felt to be mostly secondary to her  morbid obesity. Denies any orthopnea, PND, lightheadedness, dizziness.  She states her blood pressure has been well controlled with her  medication, tolerating medications without problems.  Her primary  complaint is of orthopedic discomfort secondary to decreased mobility  and weightbearing discomfort on her joints.  She is very excited about  the pending bariatric surgery and eager to proceed with the surgery.   PAST MEDICAL HISTORY:  1. Chronic dyspnea on exertion.  2. Well controlled hypertension.  3. Dyslipidemia with intolerance to Statin drug therapy.  4. Abnormal EKG with an incomplete right bundle branch block and left      ventricular hypertrophy unchanged.  5. Diabetes.  6. Morbid obesity.  7. Anemia which is currently  stable.  Hemoglobin noted to be stable at      12 per Tamara Mccullough report.  Patient's anemia is followed by Dr.      Cleone Mccullough.  8. Status post recent dobutamine stress echo showing normal ejection      fraction and no ischemia.   REVIEW OF SYSTEMS:  As stated above.   CURRENT MEDICATIONS:  1. Nexium 40 mg daily.  2. Aspirin 81 mg daily.  3. Lisinopril/hydrochlorothiazide 20/25 mg daily.  4. Hydroxyzine 50 mg b.i.d.  5. Cinnamon 500 mg p.o. b.i.d.  6. Crestor 10 mg daily.  7. Avandia 2 mg daily.  8. Combivent inhaler p.r.n.   ALLERGIES:  CODEINE and intolerance to STATINS.   PHYSICAL EXAMINATION:  GENERAL APPEARANCE:  Tamara Mccullough is in no acute  distress, very pleasant, cooperative 55 year old Caucasian female.  VITAL SIGNS:  Blood pressure today 118/67, heart rate 73, weight 379.4  pounds.  HEENT:  Pupils are equal, round and reactive to light.  Normocephalic  and atraumatic.  NECK:  Supple, no carotid bruits, unable to evaluate for JVD secondary  to body habitus.  LUNGS:  Clear bilaterally.  CARDIOVASCULAR:  S1 and S2, regular rate and rhythm.  ABDOMEN:  Soft and nontender, positive bowel sounds.  EXTREMITIES:  Without clubbing, cyanosis, or edema.  NEUROLOGIC:  Patient alert and oriented x3.  Cranial nerves II-XII  grossly intact.   A 12-lead EKG today shows a normal sinus rhythm with left axis  deviation, incomplete right bundle branch block, LVH, no change from  previous EKG in 2007.   PROBLEM LIST:  1. Ongoing dyspnea most likely secondary to deconditioning and morbid      obesity.  2. Hypertension, well controlled with current medical regimen.  3. Abnormal EKG but unchanged with an incomplete right bundle branch      block, status post recent stress echocardiogram  negative for      ischemia.   PLAN:  Proceed with a 2-D echocardiogram  as requested by [facility] for preoperative clearance with bariatric surgery.  Patient  has no specific cardiovascular  contraindications in reference to  bariatric surgery.      Tamara Mccullough, ACNP  Electronically Signed      Tamara Codding, MD,FACC  Electronically Signed   MB/MedQ  DD: 05/27/2006  DT: 05/27/2006  Job #: [id number]   cc:   Tamara Mccullough, M.D.  Tamara Mccullough  Tamara Mccullough

## 2010-06-22 NOTE — Op Note (Signed)
NAME:  Tamara Mccullough, Tamara Mccullough             ACCOUNT NO.:  [id number]   MEDICAL RECORD NO.:  [id number]          PATIENT TYPE:  AMB   LOCATION:  ENDO                         FACILITY:  [facility]   PHYSICIAN:  John C. Madilyn Fireman, M.D.    DATE OF BIRTH:  09-Mar-1955   DATE OF PROCEDURE:  02/08/2004  DATE OF DISCHARGE:                                 OPERATIVE REPORT   PROCEDURE:  Colonoscopy.   INDICATIONS FOR PROCEDURE:  Average risk colon cancer screening.   DESCRIPTION OF PROCEDURE:  The patient was placed in the left lateral  decubitus position and placed on the pulse monitor with continuous low-flow  oxygen delivered by nasal cannula.  She was sedated with 125 mcg IV fentanyl  and 10 mg IV Versed and 12.5 mg IV Phenergan.  The Olympus video colonoscope  was inserted into the rectum and advanced to the cecum, confirmed by  transillumination of McBurney's point and visualization of the ileocecal  valve and appendiceal orifice.  Prep was suboptimal.  I could not rule out  small lesions of less than 1 cm in all areas.  Otherwise the cecum,  ascending, transverse, descending, and sigmoid colon all appeared normal  with no masses, polyps, diverticula, or other mucosal abnormalities.  The  rectum likewise appeared normal and retroflexed view of the anus revealed  small internal hemorrhoids.  The scope was then withdrawn and the patient  returned to the recovery room in stable condition.  She tolerated the  procedure well and there were no immediate complications.   IMPRESSION:  Internal hemorrhoids. Otherwise normal colonoscopy.   PLAN:  The next colon screening by sigmoidoscopy in five years.      JCH/MEDQ  D:  02/08/2004  T:  02/08/2004  Job:  [id number]   cc:   Ernestina Penna, M.D.  [address] [place]  [place] [address]  Fax: [phone]

## 2010-06-22 NOTE — Assessment & Plan Note (Signed)
[facility]                          [facility] OFFICE NOTE   NAME:Tamara Mccullough, Tamara Mccullough                    MRN:          [id number]  DATE:01/17/2006                            DOB:          November 01, 1955    HISTORY OF PRESENT ILLNESS:  The patient is a 55 year old, divorced  female patient of Dr. Joyce Copa and Dr. Kathi Der.  The patient has a  history of dyspnea on exertion which was felt to be secondary to weight  and deconditioning.  The patient also has a history of hypertension,  dyslipidemia.  She had an echocardiogram study done in May 2007 which  showed a normal ejection fraction estimated at 65% with no wall motion  abnormalities and no valvular lesions.  She was given recommendations  regarding modification of cardiac risk factors.   The patient has now been scheduled for bariatric surgery.  She denies  any substernal chest pain.  She does still report dyspnea on minimal  exertion. She has a history of anemia for which she sees Dr. Cleone Slim.  Her  hemoglobin has not been stable at around 12 and she has been receiving  iron supplementation.  She also had an extensive prior workup according  to her history but no source of bleeding was found.   From a cardiovascular perspective, the patient appears to be stable.   MEDICATIONS:  1. Ambien 10 mg p.o. nightly.  2. Advair 250/50 b.i.d.  3. Aspirin 81 mg p.o. daily.  4. Lisinopril/hydrochlorothiazide 20/25.  5. Hydroxyzine 50 b.i.d.  6. Tramadol 50 mg 2 tablets nightly.  7. Requip 1 mg p.o. nightly.  8. __________ 5 mg p.o. b.i.d.  9. Crestor 10 mg daily.  10.Vitamin E 400 mg p.o. daily.   PHYSICAL EXAMINATION:  VITAL SIGNS:  Blood pressure is 140/70.  Heart  rate is 78 beats per minute.  Weight is 281 pounds.  NECK:  Normal carotid upstroke and no carotid bruits.  LUNGS:  Clear breath sounds bilaterally.  HEART:  Regular rate and rhythm.  Normal S1, S2.  No murmurs, rubs, or  gallops.  ABDOMEN:  Soft, nontender.  No rebound, tenderness, or guarding.  Good  bowel sounds.  EXTREMITIES:  No cyanosis, clubbing, or edema.  NEURO:  The patient is alert, oriented, and grossly nonfocal.   Twelve-lead echocardiogram normal sinus rhythm, incomplete right bundle  branch block, minimal voltage criteria for LVH, unchanged from prior  tracing.   PROBLEM LIST:  1. Dyspnea, chronic.  2. Hypertension, controlled.  3. Dyslipidemia, INTOLERANCE TO STATIN DRUG THERAPY.  4. Abnormal EKG but unchanged, incomplete right bundle branch block      and left ventricular hypertrophy.  5. Diabetes mellitus.  6. Obesity.  7. Anemia, stable.   PLAN:  1. The patient has no specific cardiovascular contraindications to      proceed with bariatric surgery.  2. The patient does have multiple risk factors and, prior to surgery,      we will proceed with dobutamine stress echocardiographic study to      rule out significant heart disease.  3. The patient will followup for review of  her stress test.     Learta Codding, MD,FACC  Electronically Signed    GED/MedQ  DD: 01/17/2006  DT: 01/17/2006  Job #: [id number]   cc:   Ernestina Penna, M.D.  Belva Bertin, MD @ [facility]

## 2010-07-19 ENCOUNTER — Encounter: Payer: Self-pay | Admitting: Nurse Practitioner

## 2011-02-01 ENCOUNTER — Other Ambulatory Visit: Payer: Self-pay | Admitting: Neurosurgery

## 2011-02-01 DIAGNOSIS — M542 Cervicalgia: Secondary | ICD-10-CM

## 2011-02-01 DIAGNOSIS — M5410 Radiculopathy, site unspecified: Secondary | ICD-10-CM

## 2011-02-01 DIAGNOSIS — M549 Dorsalgia, unspecified: Secondary | ICD-10-CM

## 2011-02-06 DIAGNOSIS — M810 Age-related osteoporosis without current pathological fracture: Secondary | ICD-10-CM | POA: Diagnosis not present

## 2011-02-06 DIAGNOSIS — Z79899 Other long term (current) drug therapy: Secondary | ICD-10-CM | POA: Diagnosis not present

## 2011-02-07 ENCOUNTER — Ambulatory Visit
Admission: RE | Admit: 2011-02-07 | Discharge: 2011-02-07 | Disposition: A | Discharge status: Home / Self Care | Payer: Medicare Other | Source: Ambulatory Visit | Attending: Neurosurgery | Admitting: Neurosurgery

## 2011-02-07 DIAGNOSIS — M8448XA Pathological fracture, other site, initial encounter for fracture: Secondary | ICD-10-CM | POA: Diagnosis not present

## 2011-02-07 DIAGNOSIS — M549 Dorsalgia, unspecified: Secondary | ICD-10-CM

## 2011-02-07 DIAGNOSIS — M5137 Other intervertebral disc degeneration, lumbosacral region: Secondary | ICD-10-CM | POA: Diagnosis not present

## 2011-02-07 DIAGNOSIS — M542 Cervicalgia: Secondary | ICD-10-CM

## 2011-02-07 DIAGNOSIS — M5410 Radiculopathy, site unspecified: Secondary | ICD-10-CM

## 2011-02-07 DIAGNOSIS — M5030 Other cervical disc degeneration, unspecified cervical region: Secondary | ICD-10-CM | POA: Diagnosis not present

## 2011-02-07 MED ORDER — ONDANSETRON HCL 4 MG/2ML IJ SOLN
4.0000 mg | Freq: Once | INTRAMUSCULAR | Status: AC
  Administered 2011-02-07: 4 mg via INTRAMUSCULAR

## 2011-02-07 MED ORDER — IOHEXOL 300 MG/ML  SOLN
10.0000 mL | Freq: Once | INTRAMUSCULAR | Status: AC | PRN
  Administered 2011-02-07: 10 mL via INTRATHECAL

## 2011-02-07 MED ORDER — MEPERIDINE HCL 100 MG/ML IJ SOLN
75.0000 mg | Freq: Once | INTRAMUSCULAR | Status: AC
  Administered 2011-02-07: 75 mg via INTRAMUSCULAR

## 2011-02-07 MED ORDER — DIAZEPAM 5 MG PO TABS
10.0000 mg | ORAL_TABLET | Freq: Once | ORAL | Status: AC
  Administered 2011-02-07: 10 mg via ORAL

## 2011-02-07 NOTE — Progress Notes (Signed)
Pt given pain meds at her request, pt asked for verification why she needed to stay off prozac and tramadol for the next 24 hours. Pt has held these 2 meds for the pass 48 hours.

## 2011-02-07 NOTE — Patient Instructions (Signed)
Myelogram  Discharge Instructions  1. Go home and rest quietly for the next 24 hours.  It is important to lie flat for the next 24 hours.  Get up only to go to the restroom.  You may lie in the bed or on a couch on your back, your stomach, your left side or your right side.  You may have one pillow under your head.  You may have pillows between your knees while you are on your side or under your knees while you are on your back.  2. DO NOT drive today.  Recline the seat as far back as it will go, while still wearing your seat belt, on the way home.  3. You may get up to go to the bathroom as needed.  You may sit up for 10 minutes to eat.  You may resume your normal diet and medications unless otherwise indicated.  4. The incidence of headache, nausea, or vomiting is about 5% (one in 20 patients).  If you develop a headache, lie flat and drink plenty of fluids until the headache goes away.  Caffeinated beverages may be helpful.  If you develop severe nausea and vomiting or a headache that does not go away with flat bed rest, call [phone].  5. You may resume normal activities after your 24 hours of bed rest is over; however, do not exert yourself strongly or do any heavy lifting tomorrow.  6. Call your physician for a follow-up appointment.  The results of your myelogram will be sent directly to your physician by the following day.  7. If you have any questions or if complications develop after you arrive home, please call [phone].  Discharge instructions have been explained to the patient.  The patient, or the person responsible for the patient, fully understands these instructions.   Resume tramadol and fluoxetine 02/08/11, after 9:30 am.

## 2011-02-07 NOTE — Progress Notes (Signed)
Patient states she has been off her Tramadol and Fluoxetine for the past two days.  jkl

## 2011-03-07 DIAGNOSIS — M545 Low back pain: Secondary | ICD-10-CM | POA: Diagnosis not present

## 2011-03-07 DIAGNOSIS — M542 Cervicalgia: Secondary | ICD-10-CM | POA: Diagnosis not present

## 2011-03-07 DIAGNOSIS — M5137 Other intervertebral disc degeneration, lumbosacral region: Secondary | ICD-10-CM | POA: Diagnosis not present

## 2011-03-07 DIAGNOSIS — M5030 Other cervical disc degeneration, unspecified cervical region: Secondary | ICD-10-CM | POA: Diagnosis not present

## 2011-03-08 DIAGNOSIS — F411 Generalized anxiety disorder: Secondary | ICD-10-CM | POA: Diagnosis not present

## 2011-03-08 DIAGNOSIS — I10 Essential (primary) hypertension: Secondary | ICD-10-CM | POA: Diagnosis not present

## 2011-03-08 DIAGNOSIS — E538 Deficiency of other specified B group vitamins: Secondary | ICD-10-CM | POA: Diagnosis not present

## 2011-03-08 DIAGNOSIS — F329 Major depressive disorder, single episode, unspecified: Secondary | ICD-10-CM | POA: Diagnosis not present

## 2011-03-08 DIAGNOSIS — E785 Hyperlipidemia, unspecified: Secondary | ICD-10-CM | POA: Diagnosis not present

## 2011-03-12 ENCOUNTER — Other Ambulatory Visit: Payer: Self-pay | Admitting: Family Medicine

## 2011-03-12 ENCOUNTER — Other Ambulatory Visit: Payer: Self-pay | Admitting: Urology

## 2011-03-12 DIAGNOSIS — Z79899 Other long term (current) drug therapy: Secondary | ICD-10-CM | POA: Diagnosis not present

## 2011-03-12 DIAGNOSIS — E079 Disorder of thyroid, unspecified: Secondary | ICD-10-CM

## 2011-03-12 DIAGNOSIS — E039 Hypothyroidism, unspecified: Secondary | ICD-10-CM | POA: Diagnosis not present

## 2011-03-12 DIAGNOSIS — R7989 Other specified abnormal findings of blood chemistry: Secondary | ICD-10-CM | POA: Diagnosis not present

## 2011-03-12 DIAGNOSIS — I10 Essential (primary) hypertension: Secondary | ICD-10-CM | POA: Diagnosis not present

## 2011-03-12 DIAGNOSIS — E785 Hyperlipidemia, unspecified: Secondary | ICD-10-CM | POA: Diagnosis not present

## 2011-03-19 ENCOUNTER — Ambulatory Visit
Admission: RE | Admit: 2011-03-19 | Discharge: 2011-03-19 | Disposition: A | Discharge status: Home / Self Care | Payer: Medicare Other | Source: Ambulatory Visit | Attending: Family Medicine | Admitting: Family Medicine

## 2011-03-19 ENCOUNTER — Other Ambulatory Visit (HOSPITAL_COMMUNITY)
Admission: RE | Admit: 2011-03-19 | Discharge: 2011-03-19 | Disposition: A | Discharge status: Home / Self Care | Payer: Medicare Other | Source: Ambulatory Visit | Attending: Interventional Radiology | Admitting: Interventional Radiology

## 2011-03-19 ENCOUNTER — Other Ambulatory Visit: Payer: Medicare Other

## 2011-03-19 DIAGNOSIS — E049 Nontoxic goiter, unspecified: Secondary | ICD-10-CM | POA: Diagnosis not present

## 2011-03-19 DIAGNOSIS — E079 Disorder of thyroid, unspecified: Secondary | ICD-10-CM

## 2011-03-19 DIAGNOSIS — E0789 Other specified disorders of thyroid: Secondary | ICD-10-CM | POA: Diagnosis not present

## 2011-04-22 DIAGNOSIS — IMO0002 Reserved for concepts with insufficient information to code with codable children: Secondary | ICD-10-CM | POA: Diagnosis not present

## 2011-04-22 DIAGNOSIS — M545 Low back pain, unspecified: Secondary | ICD-10-CM | POA: Diagnosis not present

## 2011-05-28 DIAGNOSIS — F3189 Other bipolar disorder: Secondary | ICD-10-CM | POA: Diagnosis not present

## 2011-05-28 DIAGNOSIS — F0630 Mood disorder due to known physiological condition, unspecified: Secondary | ICD-10-CM | POA: Diagnosis not present

## 2011-06-07 DIAGNOSIS — M545 Low back pain, unspecified: Secondary | ICD-10-CM | POA: Diagnosis not present

## 2011-06-07 DIAGNOSIS — IMO0002 Reserved for concepts with insufficient information to code with codable children: Secondary | ICD-10-CM | POA: Diagnosis not present

## 2011-07-02 DIAGNOSIS — E559 Vitamin D deficiency, unspecified: Secondary | ICD-10-CM | POA: Diagnosis not present

## 2011-07-02 DIAGNOSIS — D649 Anemia, unspecified: Secondary | ICD-10-CM | POA: Diagnosis not present

## 2011-07-02 DIAGNOSIS — I10 Essential (primary) hypertension: Secondary | ICD-10-CM | POA: Diagnosis not present

## 2011-07-02 DIAGNOSIS — E785 Hyperlipidemia, unspecified: Secondary | ICD-10-CM | POA: Diagnosis not present

## 2011-07-02 DIAGNOSIS — F411 Generalized anxiety disorder: Secondary | ICD-10-CM | POA: Diagnosis not present

## 2011-07-10 DIAGNOSIS — IMO0002 Reserved for concepts with insufficient information to code with codable children: Secondary | ICD-10-CM | POA: Diagnosis not present

## 2011-07-10 DIAGNOSIS — M545 Low back pain, unspecified: Secondary | ICD-10-CM | POA: Diagnosis not present

## 2011-08-12 DIAGNOSIS — M545 Low back pain: Secondary | ICD-10-CM | POA: Diagnosis not present

## 2011-08-12 DIAGNOSIS — M542 Cervicalgia: Secondary | ICD-10-CM | POA: Diagnosis not present

## 2011-08-12 DIAGNOSIS — IMO0002 Reserved for concepts with insufficient information to code with codable children: Secondary | ICD-10-CM | POA: Diagnosis not present

## 2011-08-12 DIAGNOSIS — M5412 Radiculopathy, cervical region: Secondary | ICD-10-CM | POA: Diagnosis not present

## 2011-08-14 DIAGNOSIS — E538 Deficiency of other specified B group vitamins: Secondary | ICD-10-CM | POA: Diagnosis not present

## 2011-08-26 DIAGNOSIS — J40 Bronchitis, not specified as acute or chronic: Secondary | ICD-10-CM | POA: Diagnosis not present

## 2011-10-04 DIAGNOSIS — R5383 Other fatigue: Secondary | ICD-10-CM | POA: Diagnosis not present

## 2011-10-04 DIAGNOSIS — E785 Hyperlipidemia, unspecified: Secondary | ICD-10-CM | POA: Diagnosis not present

## 2011-10-04 DIAGNOSIS — R5381 Other malaise: Secondary | ICD-10-CM | POA: Diagnosis not present

## 2011-10-04 DIAGNOSIS — M79609 Pain in unspecified limb: Secondary | ICD-10-CM | POA: Diagnosis not present

## 2011-10-04 DIAGNOSIS — I10 Essential (primary) hypertension: Secondary | ICD-10-CM | POA: Diagnosis not present

## 2011-10-04 DIAGNOSIS — E559 Vitamin D deficiency, unspecified: Secondary | ICD-10-CM | POA: Diagnosis not present

## 2011-10-15 DIAGNOSIS — M542 Cervicalgia: Secondary | ICD-10-CM | POA: Diagnosis not present

## 2011-11-07 DIAGNOSIS — Z23 Encounter for immunization: Secondary | ICD-10-CM | POA: Diagnosis not present

## 2011-11-07 DIAGNOSIS — F411 Generalized anxiety disorder: Secondary | ICD-10-CM | POA: Diagnosis not present

## 2011-11-26 DIAGNOSIS — F0630 Mood disorder due to known physiological condition, unspecified: Secondary | ICD-10-CM | POA: Diagnosis not present

## 2011-11-26 DIAGNOSIS — F3189 Other bipolar disorder: Secondary | ICD-10-CM | POA: Diagnosis not present

## 2011-12-03 DIAGNOSIS — M542 Cervicalgia: Secondary | ICD-10-CM | POA: Diagnosis not present

## 2012-01-06 DIAGNOSIS — M542 Cervicalgia: Secondary | ICD-10-CM | POA: Diagnosis not present

## 2012-01-20 DIAGNOSIS — M5412 Radiculopathy, cervical region: Secondary | ICD-10-CM | POA: Diagnosis not present

## 2012-01-20 DIAGNOSIS — M47812 Spondylosis without myelopathy or radiculopathy, cervical region: Secondary | ICD-10-CM | POA: Diagnosis not present

## 2012-01-20 DIAGNOSIS — M542 Cervicalgia: Secondary | ICD-10-CM | POA: Diagnosis not present

## 2012-01-27 DIAGNOSIS — M4802 Spinal stenosis, cervical region: Secondary | ICD-10-CM | POA: Diagnosis not present

## 2012-01-27 DIAGNOSIS — M5412 Radiculopathy, cervical region: Secondary | ICD-10-CM | POA: Diagnosis not present

## 2012-01-27 DIAGNOSIS — G5600 Carpal tunnel syndrome, unspecified upper limb: Secondary | ICD-10-CM | POA: Diagnosis not present

## 2012-02-14 DIAGNOSIS — E785 Hyperlipidemia, unspecified: Secondary | ICD-10-CM | POA: Diagnosis not present

## 2012-02-14 DIAGNOSIS — G4700 Insomnia, unspecified: Secondary | ICD-10-CM | POA: Diagnosis not present

## 2012-02-14 DIAGNOSIS — Z79899 Other long term (current) drug therapy: Secondary | ICD-10-CM | POA: Diagnosis not present

## 2012-02-14 DIAGNOSIS — I10 Essential (primary) hypertension: Secondary | ICD-10-CM | POA: Diagnosis not present

## 2012-02-14 DIAGNOSIS — F329 Major depressive disorder, single episode, unspecified: Secondary | ICD-10-CM | POA: Diagnosis not present

## 2012-02-20 DIAGNOSIS — E538 Deficiency of other specified B group vitamins: Secondary | ICD-10-CM | POA: Diagnosis not present

## 2012-02-20 DIAGNOSIS — M5412 Radiculopathy, cervical region: Secondary | ICD-10-CM | POA: Diagnosis not present

## 2012-02-27 DIAGNOSIS — E538 Deficiency of other specified B group vitamins: Secondary | ICD-10-CM | POA: Diagnosis not present

## 2012-02-28 DIAGNOSIS — G5600 Carpal tunnel syndrome, unspecified upper limb: Secondary | ICD-10-CM | POA: Diagnosis not present

## 2012-04-03 DIAGNOSIS — E538 Deficiency of other specified B group vitamins: Secondary | ICD-10-CM | POA: Diagnosis not present

## 2012-04-13 DIAGNOSIS — E538 Deficiency of other specified B group vitamins: Secondary | ICD-10-CM | POA: Diagnosis not present

## 2012-05-20 ENCOUNTER — Ambulatory Visit (INDEPENDENT_AMBULATORY_CARE_PROVIDER_SITE_OTHER): Payer: Medicare Other | Admitting: Nurse Practitioner

## 2012-05-20 ENCOUNTER — Encounter: Payer: Self-pay | Admitting: Nurse Practitioner

## 2012-05-20 VITALS — BP 99/73 | HR 70 | Temp 97.8°F | Ht 63.0 in | Wt 227.0 lb

## 2012-05-20 DIAGNOSIS — F331 Major depressive disorder, recurrent, moderate: Secondary | ICD-10-CM | POA: Diagnosis not present

## 2012-05-20 DIAGNOSIS — F411 Generalized anxiety disorder: Secondary | ICD-10-CM

## 2012-05-20 DIAGNOSIS — E538 Deficiency of other specified B group vitamins: Secondary | ICD-10-CM | POA: Diagnosis not present

## 2012-05-20 DIAGNOSIS — I10 Essential (primary) hypertension: Secondary | ICD-10-CM | POA: Insufficient documentation

## 2012-05-20 DIAGNOSIS — E785 Hyperlipidemia, unspecified: Secondary | ICD-10-CM | POA: Diagnosis not present

## 2012-05-20 DIAGNOSIS — F0630 Mood disorder due to known physiological condition, unspecified: Secondary | ICD-10-CM | POA: Insufficient documentation

## 2012-05-20 LAB — ANEMIA PANEL 7
ABS Retic: 88.2 10*3/uL (ref 19.0–186.0)
Iron: 120 ug/dL (ref 42–145)
MCV: 85.3 fL (ref 78.0–100.0)
Platelets: 224 10*3/uL (ref 150–400)
RBC: 4.9 MIL/uL (ref 3.87–5.11)
Retic Ct Pct: 1.8 % (ref 0.4–2.3)
WBC: 5 10*3/uL (ref 4.0–10.5)

## 2012-05-20 LAB — COMPLETE METABOLIC PANEL WITH GFR
AST: 22 U/L (ref 0–37)
Alkaline Phosphatase: 94 U/L (ref 39–117)
GFR, Est Non African American: 77 mL/min
Glucose, Bld: 97 mg/dL (ref 70–99)
Sodium: 137 mEq/L (ref 135–145)
Total Bilirubin: 0.4 mg/dL (ref 0.3–1.2)
Total Protein: 6.6 g/dL (ref 6.0–8.3)

## 2012-05-20 MED ORDER — ALPRAZOLAM 0.5 MG PO TABS: 0.5000 mg | ORAL_TABLET | Freq: Two times a day (BID) | ORAL | Status: DC

## 2012-05-20 MED ORDER — LISINOPRIL 10 MG PO TABS: 10.0000 mg | ORAL_TABLET | Freq: Every day | ORAL | Status: DC

## 2012-05-20 NOTE — Progress Notes (Signed)
  Subjective:    Patient ID: Tamara Mccullough, female    DOB: Feb 17, 1955, 57 y.o.   MRN: [id number]  Hypertension This is a chronic problem. The current episode started more than 1 year ago. The problem has been resolved since onset. The problem is controlled. Associated symptoms include anxiety. Pertinent negatives include no chest pain, peripheral edema or shortness of breath. Neck pain: chronic. Agents associated with hypertension include decongestants. Risk factors for coronary artery disease include dyslipidemia, obesity, post-menopausal state and sedentary lifestyle. Past treatments include ACE inhibitors. The current treatment provides moderate improvement. Compliance problems include diet.  There is no history of heart failure.  Anxiety Presents for follow-up visit. Symptoms include depressed mood, insomnia, malaise and muscle tension. Patient reports no chest pain, shortness of breath or suicidal ideas. Symptoms occur occasionally. The severity of symptoms is moderate. The quality of sleep is poor.   Compliance with medications is 76-100%.  Hyperlipidemia This is a chronic problem. The current episode started more than 1 year ago. Exacerbating diseases include obesity. She has no history of diabetes. Factors aggravating her hyperlipidemia include fatty foods. Associated symptoms include myalgias. Pertinent negatives include no chest pain or shortness of breath. She is currently on no antihyperlipidemic treatment. Compliance problems include adherence to diet.  Risk factors for coronary artery disease include post-menopausal, hypertension, dyslipidemia and obesity.      Review of Systems  HENT: Positive for ear pain, congestion and sinus pressure. Neck pain: chronic.   Respiratory: Positive for cough. Negative for shortness of breath.   Cardiovascular: Negative for chest pain.  Musculoskeletal: Positive for myalgias, back pain (chronic) and joint swelling.  Psychiatric/Behavioral:  Negative for suicidal ideas. The patient has insomnia.   All other systems reviewed and are negative.       Objective:   Physical Exam  Constitutional: She is oriented to person, place, and time. She appears well-developed and well-nourished.  HENT:  Head: Normocephalic.  Right Ear: Hearing, tympanic membrane, external ear and ear canal normal.  Left Ear: Hearing, tympanic membrane, external ear and ear canal normal.  Nose: Nose normal.  Mouth/Throat: Uvula is midline, oropharynx is clear and moist and mucous membranes are normal.  Eyes: Conjunctivae and EOM are normal. Pupils are equal, round, and reactive to light.  Neck: Normal range of motion. Neck supple. No JVD present. Erythema present. No thyromegaly present.  Cardiovascular: Normal rate, normal heart sounds and intact distal pulses.   No murmur heard. Pulmonary/Chest: Effort normal and breath sounds normal. She has no wheezes. She has no rales.  Abdominal: Soft. Bowel sounds are normal. She exhibits no mass.  Musculoskeletal: Normal range of motion.  Neurological: She is alert and oriented to person, place, and time. She has normal reflexes.  Skin: Skin is warm and dry.  Psychiatric: She has a normal mood and affect. Her behavior is normal. Judgment and thought content normal.    BP 99/73  Pulse 70  Temp(Src) 97.8 F (36.6 C) (Oral)  Ht 5\' 3"  (1.6 m)  Wt 227 lb (102.967 kg)  BMI 40.22 kg/m2       Assessment & Plan:

## 2012-05-20 NOTE — Patient Instructions (Signed)

## 2012-05-21 ENCOUNTER — Other Ambulatory Visit: Payer: Self-pay | Admitting: Nurse Practitioner

## 2012-05-21 DIAGNOSIS — E785 Hyperlipidemia, unspecified: Secondary | ICD-10-CM

## 2012-05-21 LAB — NMR LIPOPROFILE WITH LIPIDS
HDL Size: 9 nm — ABNORMAL LOW (ref >=9.2)
HDL-C: 62 mg/dL (ref >=40)
Large HDL-P: 6.4 umol/L (ref >=4.8)
Triglycerides: 96 mg/dL (ref <150)
VLDL Size: 44.1 nm (ref <=46.6)

## 2012-05-21 MED ORDER — ATORVASTATIN CALCIUM 40 MG PO TABS: 40.0000 mg | ORAL_TABLET | Freq: Every day | ORAL | Status: DC

## 2012-05-26 DIAGNOSIS — F3189 Other bipolar disorder: Secondary | ICD-10-CM | POA: Diagnosis not present

## 2012-05-26 DIAGNOSIS — F0630 Mood disorder due to known physiological condition, unspecified: Secondary | ICD-10-CM | POA: Diagnosis not present

## 2012-06-16 ENCOUNTER — Other Ambulatory Visit: Payer: Self-pay | Admitting: Nurse Practitioner

## 2012-06-17 ENCOUNTER — Other Ambulatory Visit: Payer: Self-pay | Admitting: Nurse Practitioner

## 2012-06-18 ENCOUNTER — Telehealth: Payer: Self-pay | Admitting: Nurse Practitioner

## 2012-06-19 NOTE — Telephone Encounter (Signed)
Mmm to address 

## 2012-06-19 NOTE — Telephone Encounter (Signed)
Patient aware.

## 2012-06-19 NOTE — Telephone Encounter (Signed)
Who ever called in meds only called in 60 pills- Refilled today but only for a month suply at a time. I called in #60 with 1 refill.

## 2012-08-04 ENCOUNTER — Other Ambulatory Visit: Payer: Self-pay | Admitting: Nurse Practitioner

## 2012-08-04 NOTE — Telephone Encounter (Signed)
Please call in rx for xanax with 2 refills 

## 2012-08-04 NOTE — Telephone Encounter (Signed)
Last seen 05/20/12   MMM  If approved phone in and have nurse call patient

## 2012-08-05 NOTE — Telephone Encounter (Signed)
Called in.

## 2012-08-17 ENCOUNTER — Other Ambulatory Visit: Payer: Self-pay | Admitting: *Deleted

## 2012-08-17 DIAGNOSIS — E785 Hyperlipidemia, unspecified: Secondary | ICD-10-CM

## 2012-08-17 MED ORDER — ATORVASTATIN CALCIUM 40 MG PO TABS: 40.0000 mg | ORAL_TABLET | Freq: Every day | ORAL | Status: DC

## 2012-08-31 ENCOUNTER — Ambulatory Visit (INDEPENDENT_AMBULATORY_CARE_PROVIDER_SITE_OTHER): Payer: Medicare Other | Admitting: Nurse Practitioner

## 2012-08-31 ENCOUNTER — Ambulatory Visit (INDEPENDENT_AMBULATORY_CARE_PROVIDER_SITE_OTHER): Payer: Medicare Other

## 2012-08-31 ENCOUNTER — Encounter: Payer: Self-pay | Admitting: Nurse Practitioner

## 2012-08-31 VITALS — BP 108/61 | HR 57 | Temp 97.3°F | Ht 66.0 in | Wt 226.0 lb

## 2012-08-31 DIAGNOSIS — R7309 Other abnormal glucose: Secondary | ICD-10-CM

## 2012-08-31 DIAGNOSIS — R5383 Other fatigue: Secondary | ICD-10-CM

## 2012-08-31 DIAGNOSIS — E538 Deficiency of other specified B group vitamins: Secondary | ICD-10-CM

## 2012-08-31 DIAGNOSIS — M533 Sacrococcygeal disorders, not elsewhere classified: Secondary | ICD-10-CM | POA: Diagnosis not present

## 2012-08-31 DIAGNOSIS — E785 Hyperlipidemia, unspecified: Secondary | ICD-10-CM | POA: Diagnosis not present

## 2012-08-31 DIAGNOSIS — I10 Essential (primary) hypertension: Secondary | ICD-10-CM

## 2012-08-31 DIAGNOSIS — D510 Vitamin B12 deficiency anemia due to intrinsic factor deficiency: Secondary | ICD-10-CM

## 2012-08-31 DIAGNOSIS — E559 Vitamin D deficiency, unspecified: Secondary | ICD-10-CM

## 2012-08-31 DIAGNOSIS — G4700 Insomnia, unspecified: Secondary | ICD-10-CM

## 2012-08-31 LAB — POCT GLYCOSYLATED HEMOGLOBIN (HGB A1C): Hemoglobin A1C: 5.5

## 2012-08-31 MED ORDER — ZOLPIDEM TARTRATE 10 MG PO TABS: 10.0000 mg | ORAL_TABLET | Freq: Every evening | ORAL | Status: DC | PRN

## 2012-08-31 MED ORDER — LISINOPRIL-HYDROCHLOROTHIAZIDE 10-12.5 MG PO TABS: 1.0000 | ORAL_TABLET | Freq: Every day | ORAL | Status: DC

## 2012-08-31 MED ORDER — ATORVASTATIN CALCIUM 40 MG PO TABS: 40.0000 mg | ORAL_TABLET | Freq: Every day | ORAL | Status: DC

## 2012-08-31 NOTE — Patient Instructions (Signed)

## 2012-08-31 NOTE — Progress Notes (Signed)
Subjective:    Patient ID: Tamara Mccullough, female    DOB: November 11, 1955, 57 y.o.   MRN: [id number]  Hyperlipidemia This is a chronic problem. The current episode started more than 1 year ago. The problem is uncontrolled. Recent lipid tests were reviewed and are high. Exacerbating diseases include diabetes and obesity. Associated symptoms include leg pain. Pertinent negatives include no chest pain, myalgias or shortness of breath. Current antihyperlipidemic treatment includes statins. Risk factors for coronary artery disease include post-menopausal, hypertension, dyslipidemia, diabetes mellitus, family history and obesity.  Hypertension This is a chronic problem. The current episode started more than 1 year ago. The problem has been resolved since onset. The problem is controlled. Associated symptoms include anxiety. Pertinent negatives include no blurred vision, chest pain, palpitations or shortness of breath. Risk factors for coronary artery disease include diabetes mellitus, dyslipidemia, family history, obesity and post-menopausal state. Past treatments include ACE inhibitors and diuretics. The current treatment provides significant improvement. There is no history of a thyroid problem. Identifiable causes of hypertension include sleep apnea.  Anxiety Presents for follow-up visit. Symptoms include nervous/anxious behavior and panic. Patient reports no chest pain, palpitations or shortness of breath. Symptoms occur occasionally. The most recent episode lasted 20 minutes. The severity of symptoms is moderate. The quality of sleep is poor. Nighttime awakenings: several.     * Patient c/o tail bone pain when she is sitting in certain position- denies any injury   Review of Systems  Eyes: Negative for blurred vision.  Respiratory: Negative for shortness of breath.   Cardiovascular: Negative for chest pain and palpitations.  Musculoskeletal: Negative for myalgias.  Psychiatric/Behavioral: The  patient is nervous/anxious.   All other systems reviewed and are negative.       Objective:   Physical Exam  Constitutional: She is oriented to person, place, and time. She appears well-developed and well-nourished.  HENT:  Head: Normocephalic.  Right Ear: External ear normal.  Left Ear: External ear normal.  Nose: Nose normal.  Mouth/Throat: Oropharynx is clear and moist.  Eyes: Conjunctivae are normal. Pupils are equal, round, and reactive to light.  Neck: Normal range of motion. Neck supple. No thyromegaly present.  Cardiovascular: Normal rate, regular rhythm, normal heart sounds and intact distal pulses.   Pulmonary/Chest: Effort normal and breath sounds normal.  Abdominal: Soft. Bowel sounds are normal. She exhibits no distension. There is no tenderness.  Musculoskeletal: Normal range of motion. She exhibits no edema and no tenderness.  Neurological: She is alert and oriented to person, place, and time.  Skin: Skin is warm and dry.  Psychiatric: She has a normal mood and affect. Her behavior is normal. Judgment and thought content normal.    BP 108/61  Pulse 57  Temp(Src) 97.3 F (36.3 C) (Oral)  Ht 5\' 6"  (1.676 m)  Wt 226 lb (102.513 kg)  BMI 36.49 kg/m2   Results for orders placed in visit on 08/31/12  POCT GLYCOSYLATED HEMOGLOBIN (HGB A1C)      Result Value Range   Hemoglobin A1C 5.5%     Pelvic xray- No acute problems no fracture-Preliminary reading by Paulene Floor, FNP  [facility]     Assessment & Plan:  1. Coccyx pain Moist heat Pillow to sit on Take tramadol as rx - DG Pelvis 1-2 Views; Future  2. Hyperlipidemia LDL goal < 100 Low fat diet and exercise - atorvastatin (LIPITOR) 40 MG tablet; Take 1 tablet (40 mg total) by mouth daily.  Dispense: 90 tablet; Refill: 1 - NMR,  lipoprofile  3. Hypertension Low NA+ diet - lisinopril-hydrochlorothiazide (PRINZIDE,ZESTORETIC) 10-12.5 MG per tablet; Take 1 tablet by mouth daily.  Dispense: 90 tablet; Refill: 1 -  CMP14+EGFR  4. Insomnia Bedtime ritual - zolpidem (AMBIEN) 10 MG tablet; Take 1 tablet (10 mg total) by mouth at bedtime as needed.  Dispense: 30 tablet; Refill: 1   Mary-Margaret Daphine Deutscher, FNP

## 2012-09-02 LAB — NMR, LIPOPROFILE

## 2012-09-02 LAB — CMP14+EGFR
Albumin/Globulin Ratio: 1.7 (ref 1.1–2.5)
Albumin: 4.2 g/dL (ref 3.5–5.5)
BUN: 18 mg/dL (ref 6–24)
Creatinine, Ser: 0.86 mg/dL (ref 0.57–1.00)
GFR calc Af Amer: 86 mL/min/{1.73_m2} (ref >59)
GFR calc non Af Amer: 75 mL/min/{1.73_m2} (ref >59)
Glucose: 128 mg/dL — ABNORMAL HIGH (ref 65–99)
Total Bilirubin: 0.3 mg/dL (ref 0.0–1.2)
Total Protein: 6.7 g/dL (ref 6.0–8.5)

## 2012-09-09 LAB — LIPID PANEL
Chol/HDL Ratio: 2.1 ratio units (ref 0.0–4.4)
VLDL Cholesterol Cal: 13 mg/dL (ref 5–40)

## 2012-09-09 LAB — SPECIMEN STATUS REPORT

## 2012-10-25 ENCOUNTER — Other Ambulatory Visit: Payer: Self-pay | Admitting: Nurse Practitioner

## 2012-10-27 NOTE — Telephone Encounter (Signed)
Pt is requesting a 90 day supply, looks like it is not due till 11/04/12? Call into [facility] if approved

## 2012-10-27 NOTE — Telephone Encounter (Signed)
Too soon- cannot do 90 day supply either- only 1 month at a time

## 2012-11-09 ENCOUNTER — Other Ambulatory Visit: Payer: Self-pay | Admitting: Nurse Practitioner

## 2012-11-10 NOTE — Telephone Encounter (Signed)
Please call in xanax rx with 1 refill 

## 2012-11-10 NOTE — Telephone Encounter (Signed)
Last seen 08/31/12  MMM  If approved route to nurse to phone into [facility]

## 2012-11-11 ENCOUNTER — Other Ambulatory Visit: Payer: Self-pay | Admitting: Nurse Practitioner

## 2012-11-11 NOTE — Telephone Encounter (Signed)
Called in.

## 2012-11-17 ENCOUNTER — Other Ambulatory Visit: Payer: Medicare Other | Admitting: Nurse Practitioner

## 2012-11-24 DIAGNOSIS — F3189 Other bipolar disorder: Secondary | ICD-10-CM | POA: Diagnosis not present

## 2012-12-01 ENCOUNTER — Ambulatory Visit (INDEPENDENT_AMBULATORY_CARE_PROVIDER_SITE_OTHER): Payer: Medicare Other | Admitting: Nurse Practitioner

## 2012-12-01 ENCOUNTER — Encounter: Payer: Self-pay | Admitting: Nurse Practitioner

## 2012-12-01 VITALS — BP 83/48 | HR 67 | Temp 98.5°F | Ht 66.0 in | Wt 225.0 lb

## 2012-12-01 DIAGNOSIS — E785 Hyperlipidemia, unspecified: Secondary | ICD-10-CM

## 2012-12-01 DIAGNOSIS — F331 Major depressive disorder, recurrent, moderate: Secondary | ICD-10-CM

## 2012-12-01 DIAGNOSIS — Z23 Encounter for immunization: Secondary | ICD-10-CM | POA: Diagnosis not present

## 2012-12-01 DIAGNOSIS — I10 Essential (primary) hypertension: Secondary | ICD-10-CM | POA: Diagnosis not present

## 2012-12-01 DIAGNOSIS — F411 Generalized anxiety disorder: Secondary | ICD-10-CM

## 2012-12-01 DIAGNOSIS — E669 Obesity, unspecified: Secondary | ICD-10-CM

## 2012-12-01 MED ORDER — ROSUVASTATIN CALCIUM 20 MG PO TABS: 10.0000 mg | ORAL_TABLET | Freq: Every day | ORAL | Status: DC

## 2012-12-01 MED ORDER — LISINOPRIL-HYDROCHLOROTHIAZIDE 10-12.5 MG PO TABS: 1.0000 | ORAL_TABLET | Freq: Every day | ORAL | Status: DC

## 2012-12-01 NOTE — Patient Instructions (Signed)

## 2012-12-01 NOTE — Progress Notes (Signed)
Subjective:    Patient ID: Tamara Mccullough, female    DOB: 03/17/1955, 56 y.o.   MRN: [id number]  Hypertension This is a chronic problem. The current episode started more than 1 year ago. The problem has been resolved since onset. The problem is controlled. Associated symptoms include anxiety. Pertinent negatives include no chest pain, peripheral edema or shortness of breath. Neck pain: chronic. Agents associated with hypertension include decongestants. Risk factors for coronary artery disease include dyslipidemia, obesity, post-menopausal state and sedentary lifestyle. Past treatments include ACE inhibitors (stopped taking lipitor because it made her hurt.). The current treatment provides moderate improvement. Compliance problems include diet.  There is no history of heart failure.  Anxiety Presents for follow-up visit. Symptoms include depressed mood, insomnia, malaise and muscle tension. Patient reports no chest pain, shortness of breath or suicidal ideas. Symptoms occur occasionally. The severity of symptoms is moderate. The quality of sleep is poor.   Compliance with medications is 76-100%.  Hyperlipidemia This is a chronic problem. The current episode started more than 1 year ago. Exacerbating diseases include obesity. She has no history of diabetes. Factors aggravating her hyperlipidemia include fatty foods. Associated symptoms include myalgias. Pertinent negatives include no chest pain or shortness of breath. She is currently on no antihyperlipidemic treatment. Compliance problems include adherence to diet.  Risk factors for coronary artery disease include post-menopausal, hypertension, dyslipidemia and obesity.  depression lamictal and prozac- combination working well for her- keeps her mood steady- Sees Dr. Betti Cruz Insomnia ambien nightly- bets from Dr Betti Cruz  Review of Systems  HENT: Positive for congestion, ear pain and sinus pressure.   Respiratory: Positive for cough. Negative for  shortness of breath.   Cardiovascular: Negative for chest pain.  Musculoskeletal: Positive for back pain (chronic), joint swelling and myalgias. Neck pain: chronic.  Psychiatric/Behavioral: Negative for suicidal ideas. The patient has insomnia.   All other systems reviewed and are negative.       Objective:   Physical Exam  Constitutional: She is oriented to person, place, and time. She appears well-developed and well-nourished.  HENT:  Head: Normocephalic.  Right Ear: Hearing, tympanic membrane, external ear and ear canal normal.  Left Ear: Hearing, tympanic membrane, external ear and ear canal normal.  Nose: Nose normal.  Mouth/Throat: Uvula is midline, oropharynx is clear and moist and mucous membranes are normal.  Eyes: Conjunctivae and EOM are normal. Pupils are equal, round, and reactive to light.  Neck: Normal range of motion. Neck supple. No JVD present. Erythema present. No thyromegaly present.  Cardiovascular: Normal rate, normal heart sounds and intact distal pulses.   No murmur heard. Pulmonary/Chest: Effort normal and breath sounds normal. She has no wheezes. She has no rales.  Abdominal: Soft. Bowel sounds are normal. She exhibits no mass.  Musculoskeletal: Normal range of motion.  Neurological: She is alert and oriented to person, place, and time. She has normal reflexes.  Skin: Skin is warm and dry.  Psychiatric: She has a normal mood and affect. Her behavior is normal. Judgment and thought content normal.    BP 83/48  Pulse 67  Temp(Src) 98.5 F (36.9 C) (Oral)  Ht 5\' 6"  (1.676 m)  Wt 225 lb (102.059 kg)  BMI 36.33 kg/m2       Assessment & Plan:   1. Other and unspecified hyperlipidemia   2. OBESITY   3. Major depressive disorder, recurrent episode, moderate   4. GAD (generalized anxiety disorder)   5. Hypertension    Orders Placed This  Encounter  Procedures  . CMP14+EGFR  . NMR, lipoprofile   Meds ordered this encounter  Medications  .  lisinopril-hydrochlorothiazide (PRINZIDE,ZESTORETIC) 10-12.5 MG per tablet    Sig: Take 1 tablet by mouth daily.    Dispense:  90 tablet    Refill:  1    Order Specific Question:  Supervising Provider    Answer:  Ernestina Penna [[id number]]  . rosuvastatin (CRESTOR) 20 MG tablet    Sig: Take 0.5 tablets (10 mg total) by mouth daily.    Dispense:  30 tablet    Refill:  0    Order Specific Question:  Supervising Provider    Answer:  Deborra Medina   Changed patinet from lipitor to crestor Continue all meds Labs pending Diet and exercise encouraged Health maintenance reviewed Follow up in 3 months Flu shot today  Mary-Margaret Daphine Deutscher, FNP

## 2012-12-03 LAB — CMP14+EGFR
ALT: 12 IU/L (ref 0–32)
Albumin/Globulin Ratio: 2.4 (ref 1.1–2.5)
CO2: 23 mmol/L (ref 18–29)
Calcium: 9.2 mg/dL (ref 8.7–10.2)
Creatinine, Ser: 0.78 mg/dL (ref 0.57–1.00)
GFR calc non Af Amer: 84 mL/min/{1.73_m2} (ref >59)
Glucose: 111 mg/dL — ABNORMAL HIGH (ref 65–99)
Potassium: 5 mmol/L (ref 3.5–5.2)
Total Protein: 6.4 g/dL (ref 6.0–8.5)

## 2012-12-03 LAB — NMR, LIPOPROFILE
HDL Cholesterol by NMR: 66 mg/dL (ref >=40)
LDL Particle Number: 1701 nmol/L — ABNORMAL HIGH (ref <1000)
LDLC SERPL CALC-MCNC: 135 mg/dL — ABNORMAL HIGH (ref <100)
Small LDL Particle Number: 538 nmol/L — ABNORMAL HIGH (ref <=527)
Triglycerides by NMR: 85 mg/dL (ref <150)

## 2012-12-09 ENCOUNTER — Encounter: Payer: Self-pay | Admitting: *Deleted

## 2013-01-05 ENCOUNTER — Other Ambulatory Visit: Payer: Self-pay | Admitting: Nurse Practitioner

## 2013-01-07 NOTE — Telephone Encounter (Signed)
Last filled 11/09/12, last seen 12/01/12, [facility]

## 2013-01-08 ENCOUNTER — Telehealth: Payer: Self-pay | Admitting: Nurse Practitioner

## 2013-01-08 NOTE — Telephone Encounter (Signed)
[facility] into [facility]

## 2013-01-08 NOTE — Telephone Encounter (Signed)
Please call in xanax rx 

## 2013-01-11 ENCOUNTER — Telehealth: Payer: Self-pay | Admitting: Nurse Practitioner

## 2013-01-12 NOTE — Telephone Encounter (Signed)
Samples ready.

## 2013-03-03 ENCOUNTER — Other Ambulatory Visit: Payer: Self-pay | Admitting: Nurse Practitioner

## 2013-03-04 ENCOUNTER — Encounter (HOSPITAL_COMMUNITY): Payer: Self-pay | Admitting: Emergency Medicine

## 2013-03-04 ENCOUNTER — Emergency Department (HOSPITAL_COMMUNITY)
Admission: EM | Admit: 2013-03-04 | Discharge: 2013-03-04 | Disposition: A | Discharge status: Home / Self Care | Payer: Medicare Other | Attending: Emergency Medicine | Admitting: Emergency Medicine

## 2013-03-04 DIAGNOSIS — J45909 Unspecified asthma, uncomplicated: Secondary | ICD-10-CM | POA: Diagnosis not present

## 2013-03-04 DIAGNOSIS — Z79899 Other long term (current) drug therapy: Secondary | ICD-10-CM | POA: Insufficient documentation

## 2013-03-04 DIAGNOSIS — D649 Anemia, unspecified: Secondary | ICD-10-CM | POA: Insufficient documentation

## 2013-03-04 DIAGNOSIS — G40909 Epilepsy, unspecified, not intractable, without status epilepticus: Secondary | ICD-10-CM | POA: Insufficient documentation

## 2013-03-04 DIAGNOSIS — F410 Panic disorder [episodic paroxysmal anxiety] without agoraphobia: Secondary | ICD-10-CM | POA: Diagnosis not present

## 2013-03-04 DIAGNOSIS — Z87891 Personal history of nicotine dependence: Secondary | ICD-10-CM | POA: Diagnosis not present

## 2013-03-04 DIAGNOSIS — G4700 Insomnia, unspecified: Secondary | ICD-10-CM | POA: Diagnosis not present

## 2013-03-04 DIAGNOSIS — R569 Unspecified convulsions: Secondary | ICD-10-CM | POA: Diagnosis not present

## 2013-03-04 DIAGNOSIS — I10 Essential (primary) hypertension: Secondary | ICD-10-CM | POA: Insufficient documentation

## 2013-03-04 DIAGNOSIS — Z9884 Bariatric surgery status: Secondary | ICD-10-CM | POA: Insufficient documentation

## 2013-03-04 DIAGNOSIS — F329 Major depressive disorder, single episode, unspecified: Secondary | ICD-10-CM | POA: Insufficient documentation

## 2013-03-04 DIAGNOSIS — S6990XA Unspecified injury of unspecified wrist, hand and finger(s), initial encounter: Secondary | ICD-10-CM | POA: Diagnosis not present

## 2013-03-04 DIAGNOSIS — W1809XA Striking against other object with subsequent fall, initial encounter: Secondary | ICD-10-CM | POA: Insufficient documentation

## 2013-03-04 DIAGNOSIS — G8929 Other chronic pain: Secondary | ICD-10-CM | POA: Diagnosis not present

## 2013-03-04 DIAGNOSIS — E785 Hyperlipidemia, unspecified: Secondary | ICD-10-CM | POA: Diagnosis not present

## 2013-03-04 DIAGNOSIS — Y9389 Activity, other specified: Secondary | ICD-10-CM | POA: Insufficient documentation

## 2013-03-04 DIAGNOSIS — G2581 Restless legs syndrome: Secondary | ICD-10-CM | POA: Diagnosis not present

## 2013-03-04 DIAGNOSIS — F3289 Other specified depressive episodes: Secondary | ICD-10-CM | POA: Diagnosis not present

## 2013-03-04 DIAGNOSIS — T4271XA Poisoning by unspecified antiepileptic and sedative-hypnotic drugs, accidental (unintentional), initial encounter: Secondary | ICD-10-CM | POA: Diagnosis not present

## 2013-03-04 DIAGNOSIS — T50901A Poisoning by unspecified drugs, medicaments and biological substances, accidental (unintentional), initial encounter: Secondary | ICD-10-CM

## 2013-03-04 DIAGNOSIS — S6980XA Other specified injuries of unspecified wrist, hand and finger(s), initial encounter: Secondary | ICD-10-CM | POA: Diagnosis not present

## 2013-03-04 DIAGNOSIS — T426X1A Poisoning by other antiepileptic and sedative-hypnotic drugs, accidental (unintentional), initial encounter: Secondary | ICD-10-CM | POA: Diagnosis not present

## 2013-03-04 DIAGNOSIS — Y929 Unspecified place or not applicable: Secondary | ICD-10-CM | POA: Insufficient documentation

## 2013-03-04 LAB — URINALYSIS, ROUTINE W REFLEX MICROSCOPIC
Bilirubin Urine: NEGATIVE
Glucose, UA: NEGATIVE mg/dL
HGB URINE DIPSTICK: NEGATIVE
Ketones, ur: NEGATIVE mg/dL
LEUKOCYTES UA: NEGATIVE
Nitrite: NEGATIVE
PH: 5.5 (ref 5.0–8.0)
Protein, ur: NEGATIVE mg/dL
Specific Gravity, Urine: >1.03 — ABNORMAL HIGH (ref 1.005–1.030)
Urobilinogen, UA: 0.2 mg/dL (ref 0.0–1.0)

## 2013-03-04 LAB — CBC WITH DIFFERENTIAL/PLATELET
Basophils Absolute: 0 10*3/uL (ref 0.0–0.1)
Basophils Relative: 1 % (ref 0–1)
EOS ABS: 0.1 10*3/uL (ref 0.0–0.7)
Eosinophils Relative: 1 % (ref 0–5)
HCT: 39.4 % (ref 36.0–46.0)
HEMOGLOBIN: 13 g/dL (ref 12.0–15.0)
Lymphocytes Relative: 20 % (ref 12–46)
Lymphs Abs: 1.2 10*3/uL (ref 0.7–4.0)
MCH: 28.7 pg (ref 26.0–34.0)
MCHC: 33 g/dL (ref 30.0–36.0)
MCV: 87 fL (ref 78.0–100.0)
MONO ABS: 0.4 10*3/uL (ref 0.1–1.0)
MONOS PCT: 7 % (ref 3–12)
NEUTROS PCT: 72 % (ref 43–77)
Neutro Abs: 4.3 10*3/uL (ref 1.7–7.7)
Platelets: 182 10*3/uL (ref 150–400)
RBC: 4.53 MIL/uL (ref 3.87–5.11)
RDW: 14.4 % (ref 11.5–15.5)
WBC: 6 10*3/uL (ref 4.0–10.5)

## 2013-03-04 LAB — COMPREHENSIVE METABOLIC PANEL
ALT: 59 U/L — ABNORMAL HIGH (ref 0–35)
AST: 46 U/L — AB (ref 0–37)
Albumin: 3.8 g/dL (ref 3.5–5.2)
Alkaline Phosphatase: 99 U/L (ref 39–117)
BUN: 20 mg/dL (ref 6–23)
CO2: 26 mEq/L (ref 19–32)
CREATININE: 0.93 mg/dL (ref 0.50–1.10)
Calcium: 8.7 mg/dL (ref 8.4–10.5)
Chloride: 99 mEq/L (ref 96–112)
GFR calc Af Amer: 76 mL/min — ABNORMAL LOW (ref >90)
GFR calc non Af Amer: 66 mL/min — ABNORMAL LOW (ref >90)
Glucose, Bld: 148 mg/dL — ABNORMAL HIGH (ref 70–99)
POTASSIUM: 4 meq/L (ref 3.7–5.3)
Sodium: 139 mEq/L (ref 137–147)
TOTAL PROTEIN: 6.8 g/dL (ref 6.0–8.3)
Total Bilirubin: 0.4 mg/dL (ref 0.3–1.2)

## 2013-03-04 LAB — RAPID URINE DRUG SCREEN, HOSP PERFORMED
AMPHETAMINES: NOT DETECTED
BARBITURATES: NOT DETECTED
Benzodiazepines: NOT DETECTED
Cocaine: NOT DETECTED
OPIATES: NOT DETECTED
Tetrahydrocannabinol: NOT DETECTED

## 2013-03-04 LAB — ETHANOL: Alcohol, Ethyl (B): <11 mg/dL (ref 0–11)

## 2013-03-04 LAB — SALICYLATE LEVEL: Salicylate Lvl: <2 mg/dL — ABNORMAL LOW (ref 2.8–20.0)

## 2013-03-04 LAB — ACETAMINOPHEN LEVEL

## 2013-03-04 MED ORDER — SODIUM CHLORIDE 0.9 % IV SOLN
1000.0000 mL | INTRAVENOUS | Status: DC
  Administered 2013-03-04 (×2): 1000 mL via INTRAVENOUS

## 2013-03-04 MED ORDER — LORAZEPAM 2 MG/ML IJ SOLN
1.0000 mg | Freq: Once | INTRAMUSCULAR | Status: AC
  Administered 2013-03-04: 1 mg via INTRAVENOUS
  Filled 2013-03-04: qty 1

## 2013-03-04 MED ORDER — ONDANSETRON HCL 4 MG/2ML IJ SOLN
4.0000 mg | Freq: Once | INTRAMUSCULAR | Status: AC
  Administered 2013-03-04: 4 mg via INTRAVENOUS

## 2013-03-04 MED ORDER — SODIUM CHLORIDE 0.9 % IV SOLN: 1000.0000 mL | Freq: Once | INTRAVENOUS | Status: DC

## 2013-03-04 MED ORDER — ONDANSETRON HCL 4 MG/2ML IJ SOLN
INTRAMUSCULAR | Status: AC
  Filled 2013-03-04: qty 2

## 2013-03-04 NOTE — Telephone Encounter (Signed)
Patient last seen in office on 12-01-12. Rx last written on 01-05-13 with 1 refill. Please advise. If approved please route to Pool B so nurse can phone in to pharmacy

## 2013-03-04 NOTE — ED Notes (Signed)
Patient placed on continuous cardiac monitoring, continuous pulse 0x monitoring. Seizure pads in place. Lab at the bedside.

## 2013-03-04 NOTE — ED Notes (Signed)
Anxious, tearful at times. States she wants "something to help her with the [place] withdrawal" talked with her at length re ambien abuse. Pt seems to understand but still asking for some type of "nerve medicine"

## 2013-03-04 NOTE — ED Notes (Signed)
Pt does not remember, states she was talking to sister in law, depressed. Family states she passed out and had a seizure.

## 2013-03-04 NOTE — Telephone Encounter (Signed)
Please review refill 

## 2013-03-04 NOTE — ED Notes (Signed)
MD at the bedside, talking with pt and family.

## 2013-03-04 NOTE — Telephone Encounter (Signed)
Patient last seen in office on

## 2013-03-04 NOTE — Discharge Instructions (Signed)
As discussed, your presentation today is likely due to overuse of your medication, and possible medication interactions.  It is very important that you followup with your physician tomorrow, to initiate the resources provided to you to obtain assistance with substance abuse.  Return here for concerning changes in your condition.    Emergency Department Resource Guide 1) Find a Doctor and Pay Out of Pocket Although you won't have to find out who is covered by your insurance plan, it is a good idea to ask around and get recommendations. You will then need to call the office and see if the doctor you have chosen will accept you as a new patient and what types of options they offer for patients who are self-pay. Some doctors offer discounts or will set up payment plans for their patients who do not have insurance, but you will need to ask so you aren't surprised when you get to your appointment.  2) Contact Your Local Health Department Not all health departments have doctors that can see patients for sick visits, but many do, so it is worth a call to see if yours does. If you don't know where your local health department is, you can check in your phone book. The CDC also has a tool to help you locate your state's health department, and many state websites also have listings of all of their local health departments.  3) Find a [facility] If your illness is not likely to be very severe or complicated, you may want to try a walk in clinic. These are popping up all over the country in pharmacies, drugstores, and shopping centers. They're usually staffed by nurse practitioners or physician assistants that have been trained to treat common illnesses and complaints. They're usually fairly quick and inexpensive. However, if you have serious medical issues or chronic medical problems, these are probably not your best option.  No Primary Care Doctor: - Call Health Connect at  [phone] - they can help you locate  a primary care doctor that  accepts your insurance, provides certain services, etc. - Physician Referral Service- [phone]  Chronic Pain Problems: Organization         Address  Phone   Notes  [facility]  [phone] Patients need to be referred by their primary care doctor.   Medication Assistance: Organization         Address  Phone   Notes  [facility] Medication [facility] [address] [place], [place] [address] [phone] --Must be a resident of [facility] -- Must have NO insurance coverage whatsoever (no Medicaid/ Medicare, etc.) -- The pt. MUST have a primary care doctor that directs their care regularly and follows them in the community   [facility]  [phone]   [employer]  [phone]    Agencies that provide inexpensive medical care: Organization         Address  Phone   Notes  [place]  [phone]   [facility]    [phone]   [facility] [place], [place] [address] [phone]   [place] [address], [place] [phone]   Planned Parenthood    [phone]   [facility]    [phone]   [place] and [place] [address], [place] Phone:  [phone], Fax:  [phone] Hours of Operation:  9 am - 6 pm, M-F.  Also accepts Medicaid/Medicare and self-pay.  [facility] for [address], [address], [place] Phone: [phone], Fax: [phone]. Hours of Operation:  8:30 am - 5:30 pm, M-F.  Also accepts Medicaid and self-pay.  [facility] [place] [address], [place] Phone: [phone]   [place], [place], [place] [phone], Ext. [phone] Mondays & Thursdays: 7-9 AM.  First 15 patients are seen on a first come, first serve basis.    [place]  Providers:  Organization         Address  Phone   Notes  [facility] [address], [place] [phone] Also accepts self-pay patients.  [facility] [address], [place]  [phone]   [place], [address], [place] [phone]   [facility] [address] [place] [phone]   Lucianne Lei [address], [place]   [phone] Only accepts [place] Access [place] patients after they have their name applied to their card.   Self-Pay (no insurance) in [facility]:  Organization         Address  Phone   Notes  Sickle Cell Patients, [facility] [place] [phone]   [facility] [place] [phone]   [facility] [place]  [address], [place], [place] [phone]   [facility]/Dr. Osei-Bonsu  [address], [place] or [address], [place] [phone] Phone number for both [place] and [place] locations is the same.  [facility] [address] [place] [phone]   [facility] [address], [place] or [address] [phone]   [facility] [address], [place] [phone], phone; [phone], fax Sees patients 1st and 3rd Saturday of every month.  Must not qualify for public or private insurance (i.e. Medicaid, Medicare, [place] Health Choice, Veterans' Benefits)  Household income should be no more than 200% of the poverty level The clinic cannot treat you if you are pregnant or think you are pregnant  Sexually transmitted diseases are not treated at the clinic.   Dental Care: Organization         Address  Phone  Notes  [facility] [phone] Accepts children up to age 54 who are enrolled in [place] or [place]; pregnant women with a Medicaid card; and children who have applied for Medicaid or [place] Health Choice, but were declined, whose parents can pay a reduced fee at time of service.  [facility] Department of [facility]  [address], [place] [phone] Accepts children up to age 28 who are enrolled in [place] or [place]; pregnant women with a Medicaid card; and children who have applied for Medicaid or [place] Health Choice, but were declined, whose parents can pay a reduced fee at time of service.  [place] Adult Dental Access PROGRAM  [place] [phone] Patients are seen by appointment only. Walk-ins are not accepted. [place] will see patients 18 years of  age and older. Monday - Tuesday (8am-5pm) Most Wednesdays (8:30-5pm) $30 per visit, cash only  [facility] Adult Dental Access PROGRAM  [address], [facility] [phone] Patients are seen by appointment only. Walk-ins are not accepted. [place] will see patients 67 years of age and older. One Wednesday Evening (Monthly: Volunteer Based).  $30 per visit, cash only  [place]  [phone] for adults; Children under age 56, call Graduate Pediatric Dentistry at [phone]. Children aged 3-14, please call [phone] to request a pediatric application.  Dental services are provided in all areas of dental care including fillings, crowns and bridges, complete and partial dentures, implants, gum treatment, root canals, and extractions. Preventive care is also provided. Treatment is provided to both adults and children. Patients are selected via a lottery and there is often a waiting list.   [facility] [address] [place]  [phone] [email or web address]   [facility]  [address] [place], [place] [phone], Ext. [phone] Second and Fourth Thursday of each month, opens at 6:30 AM; Clinic ends at 9 AM.  Patients are seen on a first-come first-served basis, and a limited number are seen during each clinic.   [facility]  [address] Hillard Danker [place], [place] [phone]   Eligibility Requirements You must have lived in [place], [place], or [place] counties for at least the last three months.   You cannot be eligible for state or federal sponsored [employer], including [employer], [place], or [employer].   You generally cannot be eligible for healthcare insurance through your employer.    How to apply: Eligibility screenings are held every Tuesday and Wednesday afternoon from 1:00 pm until 4:00 pm. You do not need an appointment for the interview!  [facility] [address] [place], [place]   [place]  [place] Department  [place]  [phone]    Behavioral Health Resources in the Community: Intensive Outpatient Programs Organization         Address  Phone  Notes  [place]. [address], [place], [place] [phone]   [facility] Outpatient [address], [place], [place]   ADS: Alcohol & Drug Svcs [address] [place], [place]   [place] [address],  [place], [place] or [phone]   Substance Abuse Resources Organization         Address  Phone  Notes  Alcohol and Drug Services  [phone]   [place]  [phone]   [facility] [place]   Chinita Pester  [phone]   Residential & Outpatient Substance Abuse Program  [phone]   Psychological Services Organization         Address  Phone  Notes  [facility]  [employer]  [phone]   [place] [address], [place] or [phone]    Mobile Crisis Teams Organization         Address  Phone  Notes  [facility], Mobile Crisis Care Unit  [phone]   [facility]  [address] [place], [place]   [facility] [address] [place] [phone]    Self-Help/Support Groups Organization  Address  Phone             Notes  [facility]ental Health Assoc. of [place] - variety of support groups  [phone]- [phone] Call for more information  Narcotics Anonymous (NA), [facility] [address], [place]  2 meetings at this location   Statisticianesidential Treatment Programs Organization         Address  Phone  Notes  [facility] [address],    [place]  [phone]   [facility]  [address], [place] [phone], [place], [place] [phone]   [facility] [address], [place] [phone] Admissions: 8am-3pm M-F  [facility] [address]    [place], [place] [phone]   [facility] [address], [place], [place] [phone]   [facility] [address]  [place], [place] [phone]   [facility] - Intensive Outpatient [address], [place], [place] [phone]   [facility] (Addiction Recovery Care [facility]oc[facility] [address]  [place], [place] [phone] or [phone]   [facility] Services (RTS) [address] [place], [place] [phone] Accepts Medicaid  [facility] [address]  [place] [phone] Substance Abuse/Addiction Treatment   [facility] Behavioral Health Resources Organization         Address  Phone  Notes  [facility]  [phone]) [phone]   Angie FavaJulie Brannon, PhD [address], [address] [place], [place]   [phone] or [phone]) [phone]    [facility]   [address] [place], [place] [phone]) [phone]   [facility] [address], [place], [place] [phone](336) [phone] Insurance/Medicaid/sponsorship through [facility]  [facility]ith and Families [address]                                    [place], [place] [phone](336) [phone] Therapy/tele-psych/case  [facility] [address].   [place], [place] [phone](336) [phone]    Dr. Lolly MustacheArfeen  [phone]   [facility] [place]  [employer] [facility] 1) [address], [place] 2) [address], [place] 3)  [address], [place] [phone](336) [phone]([phone]  [phone](336) [phone]   [facility] [place] Child Abuse Hotline ([phone](336) [phone] or [phone](336) [phone] (After Hours)

## 2013-03-04 NOTE — ED Notes (Signed)
Pt up to bedside commode, had large bowel movement

## 2013-03-04 NOTE — ED Provider Notes (Signed)
CSN: [id number]     Arrival date & time 03/04/13  1535 History  This chart was scribed for Carmin Muskrat, MD by Rolanda Lundborg, ED Scribe. This patient was seen in room APA14/APA14 and the patient's care was started at 3:35 PM.    Chief Complaint  Patient presents with  . Seizures   The history is provided by the patient. No language interpreter was used.   HPI Comments: Tamara Mccullough is a 58 y.o. female who presents to the Emergency Department complaining of having a seizure and falling on the floor. Pt reports she had 180 Ambien filled on 02/13/13 and ran out 2 days ago on 03/02/13. Pt reports she takes more Ambien than is prescribed because she is depressed and wants to sleep. Pt reports feeling overwhelming depression this morning so she called her sister-in-law who came and talked to her. Pt reports right little finger pain from hitting her finger when she fell. She reports nausea currently. She is seeing a psychiatrist but has never been in the hospital for depression. She denies h/o seizures. She denies SI or HI, hitting her head or LOC. She denies new medications.    Past Medical History  Diagnosis Date  . Anemia   . Depression   . Diabetes mellitus, type 2   . Asthma   . Depression   . Morbid obesity   . Edema   . Hypertension   . Chronic back pain   . Peripheral neuropathy   . Seizures   . Hyperlipidemia   . Panic attacks   . Insomnia   . Restless leg syndrome    Past Surgical History  Procedure Laterality Date  . 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 Tubal ligation     Family History  Problem Relation Age of Onset  . Congestive Heart Failure Mother   . Diabetes Mother   . Cancer Mother   . Congestive Heart Failure Father   . Emphysema Father    History  Substance Use Topics  . Smoking status: Former Research scientist (life sciences)  . Smokeless tobacco: Not on file  . Alcohol Use: Not on file   OB History   Grav Para Term Preterm Abortions TAB SAB Ect Mult Living                 Review  of Systems  Constitutional:       Per HPI, otherwise negative  HENT:       Per HPI, otherwise negative  Respiratory:       Per HPI, otherwise negative  Cardiovascular:       Per HPI, otherwise negative  Gastrointestinal: Negative for vomiting.  Endocrine:       Negative aside from HPI  Genitourinary:       Neg aside from HPI   Musculoskeletal:       Per HPI, otherwise negative  Skin: Negative.   Neurological: Negative for syncope.    Allergies  Alcohol-sulfur; Eszopiclone; Lansoprazole; Codeine; Motrin; and Prilosec  Home Medications   Current Outpatient Rx  Name  Route  Sig  Dispense  Refill  . ALPRAZolam (XANAX) 0.5 MG tablet      TAKE 1 TABLET BY MOUTH TWICE DAILY   60 tablet   1   . Cholecalciferol (VITAMIN D-3) 1000 UNITS CAPS   Oral   Take by mouth.         . Cyanocobalamin (B-12 PO)   Oral   Take by mouth.           Marland Kitchen  FLUoxetine (PROZAC) 20 MG tablet   Oral   Take 20 mg by mouth 3 (three) times daily.           . hydrOXYzine (ATARAX) 50 MG tablet   Oral   Take 50 mg by mouth. As needed in the am          . lamoTRIgine (LAMICTAL) 100 MG tablet   Oral   Take 100 mg by mouth daily.         Marland Kitchen lisinopril-hydrochlorothiazide (PRINZIDE,ZESTORETIC) 10-12.5 MG per tablet   Oral   Take 1 tablet by mouth daily.   90 tablet   1   . rosuvastatin (CRESTOR) 20 MG tablet   Oral   Take 0.5 tablets (10 mg total) by mouth daily.   30 tablet   0   . traMADol (ULTRAM) 50 MG tablet   Oral   Take 50 mg by mouth every 6 (six) hours as needed for pain.         Marland Kitchen zolpidem (AMBIEN) 10 MG tablet   Oral   Take 1 tablet (10 mg total) by mouth at bedtime as needed.   30 tablet   1    BP 103/55  Pulse 71  Temp(Src) 98 F (36.7 C) (Oral)  Resp 20  Ht 5\' 4"  (1.626 m)  Wt 230 lb (104.327 kg)  BMI 39.46 kg/m2  SpO2 97% Physical Exam  Nursing note and vitals reviewed. Constitutional: She is oriented to person, place, and time. She appears  well-developed and well-nourished. No distress.  HENT:  Head: Normocephalic and atraumatic.  Eyes: Conjunctivae and EOM are normal.  Cardiovascular: Normal rate and regular rhythm.   Pulmonary/Chest: Effort normal and breath sounds normal. No stridor. No respiratory distress.  Abdominal: She exhibits no distension.  Musculoskeletal: She exhibits no edema.  TTP at the MCP of the 5th digit on the right hand.  Neurological: She is alert and oriented to person, place, and time. No cranial nerve deficit.  Skin: Skin is warm and dry.  Psychiatric: She has a normal mood and affect.    ED Course  Procedures (including critical care time) Medications - No data to display  COORDINATION OF CARE: 3:45 PM- Discussed treatment plan with pt which includes blood work, IV fluids, EKG. Pt agrees to plan.  Update: Patient somnolent  I reviewed the tox info on Ambien overdose and seizures.  Update: Patient anxious - I discussed her presentation with her family She will pursue outpatient addiction counseling.  Labs Review Labs Reviewed  COMPREHENSIVE METABOLIC PANEL - Abnormal; Notable for the following:    Glucose, Bld 148 (*)    AST 46 (*)    ALT 59 (*)    GFR calc non Af Amer 66 (*)    GFR calc Af Amer 76 (*)    All other components within normal limits  SALICYLATE LEVEL - Abnormal; Notable for the following:    Salicylate Lvl <1.6 (*)    All other components within normal limits  ACETAMINOPHEN LEVEL  CBC WITH DIFFERENTIAL  ETHANOL  URINE RAPID DRUG SCREEN (HOSP PERFORMED)  URINALYSIS, ROUTINE W REFLEX MICROSCOPIC   Imaging Review No results found.  EKG Interpretation    Date/Time:  Thursday March 04 2013 16:16:25 EST Ventricular Rate:  73 PR Interval:  172 QRS Duration: 114 QT Interval:  454 QTC Calculation: 500 R Axis:   -38 Text Interpretation:  Normal sinus rhythm Left axis deviation Incomplete right bundle branch block Moderate voltage criteria for LVH,  may be normal  variant Prolonged QT Abnormal ECG No previous ECGs available Sinus rhythm Left axis deviation QT prolonged Abnormal ekg Confirmed by Carmin Muskrat  MD [phone]) on 03/04/2013 5:25:11 PM            Repeat ECG w no new changes  Date: 03/05/2013  Rate: 65  Rhythm: normal sinus rhythm  QRS Axis: left  Intervals: QT prolonged  ST/T Wave abnormalities: nonspecific T wave changes  Conduction Disutrbances:right bundle branch block  Narrative Interpretation:   Old EKG Reviewed: unchanged abnormal   6:34 PM Patient sitting upright, nauseous, but in no distress.  Rhythm strip unremarkable.   MDM   1. Overdose     I personally performed the services described in this documentation, which was scribed in my presence. The recorded information has been reviewed and is accurate.  Patient presents after ingestion of substantial amounts of Ambien with likely seizure, fall. Initially the patient is recovered from her episode, though over the course of her emergency department evaluation she recovers to baseline. With concern for toxic ingestion, patient had labs, empiric fluids, EKG, monitoring. With fluid resuscitation, antiemetics, patient improved substantially.  After approximately 6 hours of monitoring, with no EKG changes throughout, no evidence of distress, no decompensation, she was appropriate for discharge. Patient was discharged into the care of her family members who will assist her with placement into rehabilitation promptly.  CRITICAL CARE Performed by: Carmin Muskrat Total critical care time: 35 Critical care time was exclusive of separately billable procedures and treating other patients. Critical care was necessary to treat or prevent imminent or life-threatening deterioration. Critical care was time spent personally by me on the following activities: development of treatment plan with patient and/or surrogate as well as nursing, discussions with consultants, evaluation of  patient's response to treatment, examination of patient, obtaining history from patient or surrogate, ordering and performing treatments and interventions, ordering and review of laboratory studies, ordering and review of radiographic studies, pulse oximetry and re-evaluation of patient's condition.    Carmin Muskrat, MD 03/05/13 [phone]

## 2013-03-04 NOTE — Telephone Encounter (Signed)
ntbs for xanax refill

## 2013-03-04 NOTE — ED Notes (Signed)
Pt states "I'm so weak I don't know if I can walk" wants to go to bathroom. Trial ambulation with escort. Pt did fine. Was able to ambulate without assistance, gait steady.

## 2013-03-04 NOTE — ED Notes (Signed)
EMS reports pt had seizure today and fell on carpeted floor.  CBG 142. C/o r pinky pain.  Pt reports had 180 ambien filled on Jan 10 and ran out 2 days ago.  Pt states, " I just want to sleep all of the time."  Pt denies SI or HI.

## 2013-03-04 NOTE — ED Notes (Signed)
Pt has bruise and swelling to right hand, pt fell to the floor,

## 2013-03-09 ENCOUNTER — Encounter (HOSPITAL_COMMUNITY): Payer: Self-pay | Admitting: Psychology

## 2013-03-10 ENCOUNTER — Encounter: Payer: Self-pay | Admitting: Nurse Practitioner

## 2013-03-10 ENCOUNTER — Other Ambulatory Visit (HOSPITAL_COMMUNITY): Payer: Medicare Other | Attending: Psychiatry | Admitting: Psychology

## 2013-03-10 ENCOUNTER — Ambulatory Visit (INDEPENDENT_AMBULATORY_CARE_PROVIDER_SITE_OTHER): Payer: Medicare Other | Admitting: Nurse Practitioner

## 2013-03-10 VITALS — BP 115/66 | HR 66 | Temp 97.4°F | Ht 64.0 in | Wt 225.0 lb

## 2013-03-10 DIAGNOSIS — J45909 Unspecified asthma, uncomplicated: Secondary | ICD-10-CM | POA: Insufficient documentation

## 2013-03-10 DIAGNOSIS — E785 Hyperlipidemia, unspecified: Secondary | ICD-10-CM

## 2013-03-10 DIAGNOSIS — T4271XA Poisoning by unspecified antiepileptic and sedative-hypnotic drugs, accidental (unintentional), initial encounter: Secondary | ICD-10-CM

## 2013-03-10 DIAGNOSIS — F3289 Other specified depressive episodes: Secondary | ICD-10-CM | POA: Insufficient documentation

## 2013-03-10 DIAGNOSIS — F410 Panic disorder [episodic paroxysmal anxiety] without agoraphobia: Secondary | ICD-10-CM | POA: Insufficient documentation

## 2013-03-10 DIAGNOSIS — F1020 Alcohol dependence, uncomplicated: Secondary | ICD-10-CM | POA: Diagnosis present

## 2013-03-10 DIAGNOSIS — F1320 Sedative, hypnotic or anxiolytic dependence, uncomplicated: Secondary | ICD-10-CM | POA: Diagnosis not present

## 2013-03-10 DIAGNOSIS — F411 Generalized anxiety disorder: Secondary | ICD-10-CM | POA: Diagnosis not present

## 2013-03-10 DIAGNOSIS — F10220 Alcohol dependence with intoxication, uncomplicated: Secondary | ICD-10-CM

## 2013-03-10 DIAGNOSIS — I10 Essential (primary) hypertension: Secondary | ICD-10-CM | POA: Diagnosis not present

## 2013-03-10 DIAGNOSIS — E119 Type 2 diabetes mellitus without complications: Secondary | ICD-10-CM | POA: Diagnosis not present

## 2013-03-10 DIAGNOSIS — E669 Obesity, unspecified: Secondary | ICD-10-CM

## 2013-03-10 DIAGNOSIS — G2581 Restless legs syndrome: Secondary | ICD-10-CM | POA: Diagnosis not present

## 2013-03-10 DIAGNOSIS — T426X4A Poisoning by other antiepileptic and sedative-hypnotic drugs, undetermined, initial encounter: Secondary | ICD-10-CM

## 2013-03-10 DIAGNOSIS — F331 Major depressive disorder, recurrent, moderate: Secondary | ICD-10-CM | POA: Diagnosis not present

## 2013-03-10 DIAGNOSIS — F329 Major depressive disorder, single episode, unspecified: Secondary | ICD-10-CM | POA: Insufficient documentation

## 2013-03-10 DIAGNOSIS — G8929 Other chronic pain: Secondary | ICD-10-CM | POA: Insufficient documentation

## 2013-03-10 DIAGNOSIS — E538 Deficiency of other specified B group vitamins: Secondary | ICD-10-CM

## 2013-03-10 HISTORY — DX: Poisoning by unspecified antiepileptic and sedative-hypnotic drugs, accidental (unintentional), initial encounter: T42.71XA

## 2013-03-10 MED ORDER — LISINOPRIL-HYDROCHLOROTHIAZIDE 10-12.5 MG PO TABS: 1.0000 | ORAL_TABLET | Freq: Every day | ORAL | Status: DC

## 2013-03-10 NOTE — Progress Notes (Signed)
Subjective:    Patient ID: Tamara Mccullough, female    DOB: [birth year], 58 y.o.   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 SHe has appointment with Dr. Lin Landsman on February 9,2015. Now going to [facility] and she was told cannot take xanax or pain meds right now- She says that she needs something because she is very anxious and jittery.   Hypertension This is a chronic problem. The current episode started more than 1 year ago. The problem has been resolved since onset. The problem is controlled. Associated symptoms include anxiety. Pertinent negatives include no chest pain, peripheral edema or shortness of breath. Neck pain: chronic. Agents associated with hypertension include decongestants. Risk factors for coronary artery disease include dyslipidemia, obesity, post-menopausal state and sedentary lifestyle. Past treatments include ACE inhibitors. The current treatment provides moderate improvement. Compliance problems include diet.  There is no history of heart failure.  Anxiety Presents for follow-up visit. Symptoms include depressed mood, insomnia, malaise and muscle tension. Patient reports no chest pain, shortness of breath or suicidal ideas. Symptoms occur occasionally. The severity of symptoms is moderate. The quality of sleep is poor.   Compliance with medications is 76-100%.  Hyperlipidemia This is a chronic problem. The current episode started more than 1 year ago. Exacerbating diseases include obesity. She has no history of diabetes. Factors aggravating her hyperlipidemia include fatty foods. Associated symptoms  include myalgias. Pertinent negatives include no chest pain or shortness of breath. She is currently on no antihyperlipidemic treatment. Compliance problems include adherence to diet.  Risk factors for coronary artery disease include post-menopausal, hypertension, dyslipidemia and obesity.      Review of Systems  HENT: Positive for congestion, ear pain and sinus pressure.   Respiratory: Positive for cough. Negative for shortness of breath.   Cardiovascular: Negative for chest pain.  Musculoskeletal: Positive for back pain (chronic), joint swelling and myalgias. Neck pain: chronic.  Psychiatric/Behavioral: Negative for suicidal ideas. The patient has insomnia.   All other systems reviewed and are negative.       Objective:   Physical Exam  Constitutional: She is oriented to person, place, and time. She appears well-developed and well-nourished.  HENT:  Head: Normocephalic.  Right Ear: Hearing, tympanic membrane, external ear and ear canal normal.  Left Ear: Hearing, tympanic membrane, external ear and ear canal normal.  Nose: Nose normal.  Mouth/Throat: Uvula is midline, oropharynx is clear and moist and mucous membranes are normal.  Eyes: Conjunctivae and EOM are normal. Pupils are equal, round, and reactive to light.  Neck: Normal range of motion. Neck supple. No JVD present. Erythema present. No thyromegaly present.  Cardiovascular: Normal rate, normal heart sounds and intact distal pulses.   No murmur heard. Pulmonary/Chest: Effort normal and breath sounds normal. She has no wheezes. She has no rales.  Abdominal: Soft. Bowel sounds are normal. She exhibits no mass.  Musculoskeletal: Normal range of motion.  Neurological: She is alert and oriented to person, place, and time. She has normal reflexes.  Skin: Skin is warm  and dry.  Psychiatric: Her behavior is normal. Judgment and thought content normal. Her mood appears anxious.    BP 115/66  Pulse 66  Temp(Src) 97.4 F (36.3  C) (Oral)  Ht _0  (1.626 m)  Wt 225 lb (102.059 kg)  BMI 38.60 kg/m2       Assessment & Plan:   1. Other and unspecified hyperlipidemia   2. OBESITY   3. Major depressive disorder, recurrent episode, moderate   4. GAD (generalized anxiety disorder)   5. Essential hypertension, benign   6. B12 DEFICIENCY   7. Overdose of sleeping tabs   8. Hypertension    Orders Placed This Encounter  Procedures  . CMP14+EGFR  . NMR, lipoprofile     Meds ordered this encounter  Medications  . lisinopril-hydrochlorothiazide (PRINZIDE,ZESTORETIC) 10-12.5 MG per tablet    Sig: Take 1 tablet by mouth daily.    Dispense:  90 tablet    Refill:  1    Order Specific Question:  Supervising Provider    Answer:  Chipper Herb [[id number]]   Keep follow up with Dr. Lin Landsman and with [facility] NO NARCOTICS! Labs pending Health maintenance reviewed Diet and exercise encouraged Continue all meds Follow up  In 3 months   Gardner, FNP

## 2013-03-10 NOTE — Patient Instructions (Signed)

## 2013-03-11 NOTE — Progress Notes (Unsigned)
Patient ID: Tamara Mccullough, female   DOB: [birth year], 58 y.o.   MRN: [id number] CD-IOP: Orientation. The patient is a 58 yo divorced, Caucasian, female seeking treatment to address her chemical dependency. The patient lives by herself in [place], [place]. On Thursday, January 22nd, the patient was taken to [facility] ED after suffering a seizure at her home in [place], [place]. The patient admitted she had taken 180 Ambien over the course of about 20 days in addition to over 30 Xanax. She was also taking Ultram for back pain. The patient reported she had taken all of the pills and was out of everything by the 15th. She had managed to secure a bottle of wine and was drinking that over the course of the next few days. Finally, approximately 7 days after the last of the pills, she suffered a seizure. Fortunately, her sister-in-law had stopped by to check on her and had called EMS. The patient was closely monitored at the hospital, but released by the next day. Her daughter, who lives in [place], had contacted me and collected the information about the program. The patient appeared on Tuesday, the 3rd, for the orientation.  She reported a long history of drug use that began in her teens. She used alcohol, cannabis, shot cocaine, used narcotics, KSA, MDA, and benzodiazepines. The patient reported she had been sober for two years after she got pregnant and delivered a healthy baby girl. She eventually relapsed and continued to use. The patient reported she finally attained sobriety for 24 years. She was very active in the [employer], had sponsees', started 12-step programs, and was well-known throughout [facility]. However, during this time of sobriety from alcohol, she was being prescribed Xanax and various sleep medications. She reported she had been on Ambien for at least 7 years.  She was insistent that prior to December of 2014, she had never abused her medications. 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 She suffered injuries through the years and now has disability due to her mood disorder along with 5 back surgeries. The patient's father was an alcoholic and died from cirrhosis. Her 3 brothers are also alcoholics. The patient has been in 3 residential treatment centers and noted that prior to her relapse in January, her sobriety date had been July 15, 1988. In addition to her chemical dependency, the patient suffers from hypertension, high cholesterol, and Bipolar disorder. She had been diabetic, but after a gastric bypass 3 years ago, she lost over 150 lbs. and the diabetes is no longer detectable. The patient has been receiving treatment for her Bipolar disorder for many years and her medication is managed by Dr. Terrilyn Saver at the [facility] here in [place]. She is no longer being prescribed the medications she had overdosed on, but is currently taking Prozac, Vistaril, and Lamictal. The patient was pleasant and very matter-of-fact about her addiction. The documentation was reviewed, signatures obtained and the orientation completed successfully. She will return tomorrow afternoon and begin the CD-IOP.

## 2013-03-12 ENCOUNTER — Other Ambulatory Visit (HOSPITAL_COMMUNITY): Payer: Medicare Other | Attending: Psychiatry | Admitting: Psychology

## 2013-03-12 DIAGNOSIS — J45909 Unspecified asthma, uncomplicated: Secondary | ICD-10-CM | POA: Insufficient documentation

## 2013-03-12 DIAGNOSIS — E785 Hyperlipidemia, unspecified: Secondary | ICD-10-CM | POA: Insufficient documentation

## 2013-03-12 DIAGNOSIS — G609 Hereditary and idiopathic neuropathy, unspecified: Secondary | ICD-10-CM | POA: Insufficient documentation

## 2013-03-12 DIAGNOSIS — F3289 Other specified depressive episodes: Secondary | ICD-10-CM | POA: Insufficient documentation

## 2013-03-12 DIAGNOSIS — G4700 Insomnia, unspecified: Secondary | ICD-10-CM | POA: Insufficient documentation

## 2013-03-12 DIAGNOSIS — F410 Panic disorder [episodic paroxysmal anxiety] without agoraphobia: Secondary | ICD-10-CM | POA: Insufficient documentation

## 2013-03-12 DIAGNOSIS — F1320 Sedative, hypnotic or anxiolytic dependence, uncomplicated: Secondary | ICD-10-CM | POA: Insufficient documentation

## 2013-03-12 DIAGNOSIS — G2581 Restless legs syndrome: Secondary | ICD-10-CM | POA: Insufficient documentation

## 2013-03-12 DIAGNOSIS — E663 Overweight: Secondary | ICD-10-CM | POA: Insufficient documentation

## 2013-03-12 DIAGNOSIS — F329 Major depressive disorder, single episode, unspecified: Secondary | ICD-10-CM | POA: Insufficient documentation

## 2013-03-12 DIAGNOSIS — E119 Type 2 diabetes mellitus without complications: Secondary | ICD-10-CM | POA: Insufficient documentation

## 2013-03-12 DIAGNOSIS — F1020 Alcohol dependence, uncomplicated: Secondary | ICD-10-CM | POA: Insufficient documentation

## 2013-03-12 DIAGNOSIS — I10 Essential (primary) hypertension: Secondary | ICD-10-CM | POA: Insufficient documentation

## 2013-03-12 LAB — CMP14+EGFR
A/G RATIO: 2.4 (ref 1.1–2.5)
ALT: 26 IU/L (ref 0–32)
AST: 22 IU/L (ref 0–40)
Albumin: 4.5 g/dL (ref 3.5–5.5)
Alkaline Phosphatase: 96 IU/L (ref 39–117)
BILIRUBIN TOTAL: 0.5 mg/dL (ref 0.0–1.2)
BUN/Creatinine Ratio: 21 (ref 9–23)
BUN: 18 mg/dL (ref 6–24)
CALCIUM: 9.2 mg/dL (ref 8.7–10.2)
CO2: 25 mmol/L (ref 18–29)
CREATININE: 0.84 mg/dL (ref 0.57–1.00)
Chloride: 99 mmol/L (ref 97–108)
GFR, EST AFRICAN AMERICAN: 88 mL/min/{1.73_m2} (ref >59)
GFR, EST NON AFRICAN AMERICAN: 76 mL/min/{1.73_m2} (ref >59)
GLOBULIN, TOTAL: 1.9 g/dL (ref 1.5–4.5)
Glucose: 108 mg/dL — ABNORMAL HIGH (ref 65–99)
Potassium: 4.3 mmol/L (ref 3.5–5.2)
SODIUM: 141 mmol/L (ref 134–144)
Total Protein: 6.4 g/dL (ref 6.0–8.5)

## 2013-03-12 LAB — NMR, LIPOPROFILE
Cholesterol: 141 mg/dL (ref <200)
HDL Cholesterol by NMR: 67 mg/dL (ref >=40)
HDL PARTICLE NUMBER: 45.3 umol/L (ref >=30.5)
LDL Particle Number: 673 nmol/L (ref <1000)
LDL Size: 20.4 nm — ABNORMAL LOW (ref >20.5)
LDLC SERPL CALC-MCNC: 61 mg/dL (ref <100)
LP-IR Score: 37 (ref <=45)
SMALL LDL PARTICLE NUMBER: 433 nmol/L (ref <=527)
Triglycerides by NMR: 65 mg/dL (ref <150)

## 2013-03-12 NOTE — Progress Notes (Signed)
    Daily Group Progress Note  Program: CD-IOP   Group Time: 1:00-2:30pm  Participation Level: Active  Behavioral Response: Appropriate and Sharing  Type of Therapy: Process Group  Topic: Group members checked in by sharing their names and sobriety dates.  Counseling intern led 5 minutes of relaxation time using [employer].  Counselor then helped the two group members who relapsed process what led to the relapses and how they could have prevented them.  Two new group members shared their stories.  Other group members shared how they were doing and discussed issues and concerns about early recovery, including having difficulty saying "no" to requests from others.     Group Time: 2:45-4:00pm  Participation Level: Active  Behavioral Response: Appropriate  Type of Therapy: Psycho-education Group  Topic:  Counseling intern presented information about the 4 phases of recovery: bottoming out, ambivalence, commitment, and integration.  She also explained the reasons it is important to understand what to expect during each phase. Group members discussed their own experiences with phases of recovery and completed a checklist to help them determine which phase they are in.  They also discussed what they learned about themselves and their recovery from completing the checklist.   Summary: Patient initially reported a sobriety date of 1/23, but then admitted she had used yesterday and amended it to 2/4.  She reported that she took a Xanax that her brother offered her because she was feeling sick and anxious.  She then shared her story with the group, and the group members expressed concern for her and thanked her for sharing.  During the psychoeducation session, she shared about her previous and current experiences with the phases of recovery.   Family Program: Family present? No   Name of family member(s): n/a  UDS collected: Yes Results: positive for benzodiazepines, prescription drugs and  Ambien  AA/NA attended?: Yes Monday  Sponsor?: No   [employer]
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
## 2013-03-15 ENCOUNTER — Telehealth (HOSPITAL_COMMUNITY): Payer: Self-pay | Admitting: Psychology

## 2013-03-15 ENCOUNTER — Encounter (HOSPITAL_COMMUNITY): Payer: Self-pay | Admitting: Psychology

## 2013-03-15 ENCOUNTER — Other Ambulatory Visit (HOSPITAL_COMMUNITY): Payer: Medicare Other

## 2013-03-15 DIAGNOSIS — F1021 Alcohol dependence, in remission: Secondary | ICD-10-CM | POA: Insufficient documentation

## 2013-03-15 NOTE — Progress Notes (Signed)
    Daily Group Progress Note  Program: CD-IOP   Group Time: 1-2:30 pm  Participation Level: Minimal  Behavioral Response: Appropriate  Type of Therapy: Psycho-education Group  Topic: Guest Speaker: Pharmacist. The first part of group was spent with a guest speaker. Einar Grad, the pharmacist upstairs in the [facility] spent time with the group. She discussed drugs that contribute to great harm and how they affect the body and mind. She also provided education about mediations that are typically prescribed to those in early recovery and what they do to help ease distress and instability so often reported. There was excellent feedback and questions from members. During this time the medical director, Darlyne Russian, PA, met with 2 new patients and current members who were needed to discuss current medications. The group seemed very pleased with the information this visitor had provided to them.    Group Time: 2:45 - 4pm  Participation Level: Minimal  Behavioral Response: Sharing  Type of Therapy: Process Group  Topic: Group Process: The second half of group was spent in process. Members shared about current issues and concerns. One of the newer members shared about what she had done to remain drug-free since the last session. Another member explained what he was going to do differently this time in early recovery. The importance of developing a daily routine that one consistently follows was emphasized during this session. As the session neared an end, members shared about plans for the upcoming weekend.   Summary: the patient met with the medical director for much of the first half of group. When she returned to the session, she pointed out that while Trazadone helps most people, it just makes her wired. The patient admitted she is not sleeping and it is a source of great stress for her. When asked about last night, she admitted that she had stayed at home. [place] but  reported she has no fear of being alone. Her biggest concern is if she were to find some Ambien or Xanax in the house. To her knowledge, there isn't any because her daughter came to her house and went through everything. The patient reported she had gone to an [place] meeting and turned her 24 year sobriety chip in and picked up a starter chip. Everyone had been very accepting towards her and she agreed she will be plugging back into daily meetings. Her sobriety date is 2/4. We will continue to follow closely in the days ahead   Family Program: Family present? No   Name of family member(s):   UDS collected: No Results:  AA/NA attended?: YesThursday  Sponsor?: No   Tamara Mccullough, LCAS

## 2013-03-16 DIAGNOSIS — M79609 Pain in unspecified limb: Secondary | ICD-10-CM | POA: Diagnosis not present

## 2013-03-16 DIAGNOSIS — S6990XA Unspecified injury of unspecified wrist, hand and finger(s), initial encounter: Secondary | ICD-10-CM | POA: Diagnosis not present

## 2013-03-17 ENCOUNTER — Other Ambulatory Visit (HOSPITAL_COMMUNITY): Payer: Medicare Other | Admitting: Psychology

## 2013-03-17 DIAGNOSIS — F1020 Alcohol dependence, uncomplicated: Secondary | ICD-10-CM

## 2013-03-19 ENCOUNTER — Other Ambulatory Visit (HOSPITAL_COMMUNITY): Payer: Medicare Other | Admitting: Psychology

## 2013-03-19 DIAGNOSIS — F1020 Alcohol dependence, uncomplicated: Secondary | ICD-10-CM

## 2013-03-22 ENCOUNTER — Other Ambulatory Visit (HOSPITAL_COMMUNITY): Payer: Medicare Other

## 2013-03-22 ENCOUNTER — Encounter (HOSPITAL_COMMUNITY): Payer: Self-pay | Admitting: Psychology

## 2013-03-22 NOTE — Progress Notes (Signed)
    Daily Group Progress Note  Program: CD-IOP   Group Time: 1-2:30 pm  Participation Level: Active  Behavioral Response: Sharing  Type of Therapy: Process Group  Topic:  Group members checked in by sharing their names and sobriety dates.  Counselors led 5 minutes of HeartMath to promote focus and relaxation.  Several group members shared about difficulties they were experiencing in early recovery, including sleep issues, relationship problems, and selecting sponsors.  Chaplain Jeanella Craze then led a lesson on developing spiritual practices, involving the group members in discussions of their current practices and leading them through examples.    Group Time: 2:45- 4pm  Participation Level: Active  Behavioral Response: Appropriate and Sharing  Type of Therapy: Psycho-education Group  Topic: Counselors led a discussion on the 10 most common dangers in early recovery and had group members complete a worksheet on situations that are triggering for them.  Counselors reviewed each of the common dangers and had the group members discuss the reasons that each one can trigger cravings.  Group members also shared about other situations that are dangerous for them in early recovery, as well as situations that are becoming less dangerous over time as they develop better coping skills.  Summary: Patient's sobriety date remains 2/4.  She reported that she had met with her psychiatrist on Monday and asked him to cancel her refill of Ambien for her own safety.  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  She also participated during the psychoeducation session, sharing that use of prescribed medications led her to relapse.  Patient is making progress in her recovery and shows willingness to share in groups.  Family Program: Family present? No   Name of family member(s):   UDS collected: No  Results:   AA/NA attended?: [place]  Sponsor?: No, but she is looking for a temporary sponsor   Novali Vollman, LCAS

## 2013-03-24 ENCOUNTER — Other Ambulatory Visit (HOSPITAL_COMMUNITY): Payer: Medicare Other

## 2013-03-25 ENCOUNTER — Encounter (HOSPITAL_COMMUNITY): Payer: Self-pay | Admitting: Psychology

## 2013-03-25 NOTE — Progress Notes (Signed)
    Daily Group Progress Note  Program: CD-IOP   Group Time: 1-2:30 pm  Participation Level: Active  Behavioral Response: Appropriate and Sharing  Type of Therapy: Process Group  Topic: Group Process. The first part of group was spent in process. Members shared about the past few days and any obstacles or challenges to their sobriety. A new member was present and he was asked to introduce himself. During this time, the medical director was meeting with new patients and current group members who wanted to discuss any concerns with their medications.   Group Time: 2:45- 4pm  Participation Level: Active  Behavioral Response: Sharing  Type of Therapy: Activity Group  Topic: Group Activity: "Getting to Know You". The second part of group was spent in an activity that would contribute to members knowing more about each other and do it in a funny and pleasurable way. Members were asked to write 3 things about themselves, with only one of the 3 statements actually true. They're notes were placed in a basket and members drew them out of the basket, read them, and tried to guess who the Pryor Curia was. The session proved to be a light-hearted, but very revealing session.    Summary: The patient reported she had had a bad experience at a meeting last night. Some old man had told her she wasn't really an addict and minimized her drug use. She had gotten a little flustered, but let it go. The patient reported she was sleeping better and very relieved about that. She is also 'feeling like my old self" and feeling more hopeful. She reached out to the new group member and could understand some of his struggles. The patient was noted for her 'poker face' and she laughed as members tried to identify which one she was. 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 The patient was funny and a good sport during this session. She is very new in  recovery this time, despite having attained 24 years of sobriety from alcohol. All the while she was taking Xanax and Ambien, but considered these drugs acceptable because they were prescribed. Her sobriety date remains 2/4.    Family Program: Family present? No   Name of family member(s):   UDS collected: No Results:   AA/NA attended?: YesThursday  Sponsor?: No, but she is looking to secure a sponsor   Nanda Bittick, LCAS

## 2013-03-26 ENCOUNTER — Other Ambulatory Visit (HOSPITAL_COMMUNITY): Payer: Medicare Other

## 2013-03-28 ENCOUNTER — Telehealth (HOSPITAL_COMMUNITY): Payer: Self-pay | Admitting: Psychology

## 2013-03-29 ENCOUNTER — Other Ambulatory Visit (HOSPITAL_COMMUNITY): Payer: Medicare Other | Admitting: Psychology

## 2013-03-29 DIAGNOSIS — F1020 Alcohol dependence, uncomplicated: Secondary | ICD-10-CM

## 2013-03-30 ENCOUNTER — Encounter (HOSPITAL_COMMUNITY): Payer: Self-pay | Admitting: Psychology
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
## 2013-03-31 ENCOUNTER — Other Ambulatory Visit (HOSPITAL_COMMUNITY): Payer: Medicare Other

## 2013-03-31 DIAGNOSIS — F1020 Alcohol dependence, uncomplicated: Secondary | ICD-10-CM

## 2013-03-31 NOTE — Progress Notes (Unsigned)
   THERAPIST PROGRESS NOTE  Session Time: ***  Participation Level: {BHH PARTICIPATION LEVEL:22264}  Behavioral Response: {Appearance:22683}{BHH LEVEL OF CONSCIOUSNESS:22305}{BHH MOOD:22306}  Type of Therapy: {CHL AMB BH Type of Therapy:21022741}  Treatment Goals addressed: {CHL AMB BH Treatment Goals Addressed:21022754}  Interventions: {CHL AMB BH Type of Intervention:21022753}  Summary: Tamara Mccullough is a 58 y.o. female who presents with ***.   Suicidal/Homicidal: {BHH YES OR NO:22294}{yes/no/with/without intent/plan:22693}  Therapist Response: ***  Plan: Return again in *** weeks.  Diagnosis: Axis I: {psych axis 1:31909}    Axis II: {psych axis 2:31910}    B[facility] Chem 03/31/2013

## 2013-04-02 ENCOUNTER — Other Ambulatory Visit (HOSPITAL_COMMUNITY): Payer: Medicare Other

## 2013-04-02 NOTE — Progress Notes (Unsigned)
    Daily Group Progress Note  Program: CD-IOP   Group Time: 1:00-2:30pm  Participation Level: Active  Behavioral Response: Agitated and Minimizing  Type of Therapy: Process Group  Topic: The first part of group was spent in process.  Group members shared their names and sobriety dates, and then counselor led 5 minutes of relaxation with HeartMath.  The group then discussed issues related to early recovery, including finding 12-step meetings that meet their needs.  Several group members shared concerns about the results of their recent drug tests, and one group member admitted to a relapse this past Sunday.  The group helped her process the relapse and shared their own experiences with relapse.     Group Time: 2:45-4:00pm  Participation Level: Active  Behavioral Response: Appropriate  Type of Therapy: Psycho-education Group  Topic: Counselor asked the group to revisit the Core Beliefs handout from the previous group session, explained the concept of core beliefs, and where they come from.  Counselor also shared a handout on "Ten Ways to Untwist your Thinking" and reviewed the first several points.  The group discussed methods to challenge negative and unhealthy core beliefs, for example by using compassionate self-talk and examining evidence for and against the core belief.  The session ended with a graduation ceremony for a member who has completed the program.   Summary: Patient initially reported a sobriety date of 2/4, which she later amended to 2/23 after she admitted to a relapse on Sunday, 2/22.  She admitted to the relapse after she was given the results of her recent drug test, which was positive, saying that she had taken a Xanax that her brother offered her.  She became defensive, rationalized her use and made numerous excuses about why she had not attended meetings or attempted to build up a support network.  Group members provided her with feedback and encouraged her to  try different meetings in order to find one that will be helpful to her.  Patient was quiet throughout the second half of the session, but appeared to be more comfortable and less defensive.   Family Program: Family present? No   Name of family member(s): n/a  UDS collected: No Results: n/a  AA/NA attended?: No  Sponsor?: No   [facility]

## 2013-04-04 ENCOUNTER — Encounter (HOSPITAL_COMMUNITY): Payer: Self-pay

## 2013-04-05 ENCOUNTER — Other Ambulatory Visit (HOSPITAL_COMMUNITY): Payer: Medicare Other

## 2013-04-05 ENCOUNTER — Telehealth (HOSPITAL_COMMUNITY): Payer: Self-pay | Admitting: Psychology

## 2013-04-07 ENCOUNTER — Other Ambulatory Visit (HOSPITAL_COMMUNITY): Payer: Medicare Other

## 2013-04-09 ENCOUNTER — Other Ambulatory Visit (HOSPITAL_COMMUNITY): Payer: Medicare Other

## 2013-04-12 ENCOUNTER — Other Ambulatory Visit (HOSPITAL_COMMUNITY): Payer: Medicare Other

## 2013-05-03 ENCOUNTER — Encounter: Payer: Self-pay | Admitting: Psychiatry

## 2013-05-12 ENCOUNTER — Ambulatory Visit (INDEPENDENT_AMBULATORY_CARE_PROVIDER_SITE_OTHER): Payer: Medicare Other | Admitting: Nurse Practitioner

## 2013-05-12 ENCOUNTER — Encounter: Payer: Self-pay | Admitting: Nurse Practitioner

## 2013-05-12 VITALS — BP 84/53 | HR 68 | Temp 98.1°F | Ht 64.0 in | Wt 232.6 lb

## 2013-05-12 DIAGNOSIS — G2581 Restless legs syndrome: Secondary | ICD-10-CM

## 2013-05-12 DIAGNOSIS — F411 Generalized anxiety disorder: Secondary | ICD-10-CM | POA: Diagnosis not present

## 2013-05-12 MED ORDER — PRAMIPEXOLE DIHYDROCHLORIDE 0.5 MG PO TABS: 0.5000 mg | ORAL_TABLET | Freq: Every day | ORAL | Status: DC

## 2013-05-12 NOTE — Patient Instructions (Signed)
Restless Legs Syndrome Restless legs syndrome is a movement disorder. It may also be called a sensori-motor disorder.  CAUSES  No one knows what specifically causes restless legs syndrome, but it tends to run in families. It is also more common in people with low iron, in pregnancy, in people who need dialysis, and those with nerve damage (neuropathy).Some medications may make restless legs syndrome worse.Those medications include drugs to treat high blood pressure, some heart conditions, nausea, colds, allergies, and depression. SYMPTOMS Symptoms include uncomfortable sensations in the legs. These leg sensations are worse during periods of inactivity or rest. They are also worse while sitting or lying down. Individuals that have the disorder describe sensations in the legs that feel like:  Pulling.  Drawing.  Crawling.  Worming.  Boring.  Tingling.  Pins and needles.  Prickling.  Pain. The sensations are usually accompanied by an overwhelming urge to move the legs. Sudden muscle jerks may also occur. Movement provides temporary relief from the discomfort. In rare cases, the arms may also be affected. Symptoms may interfere with going to sleep (sleep onset insomnia). Restless legs syndrome may also be related to periodic limb movement disorder (PLMD). PLMD is another more common motor disorder. It also causes interrupted sleep. The symptoms from PLMD usually occur most often when you are awake. TREATMENT  Treatment for restless legs syndrome is symptomatic. This means that the symptoms are treated.   Massage and cold compresses may provide temporary relief.  Walk, stretch, or take a cold or hot bath.  Get regular exercise and a good night's sleep.  Avoid caffeine, alcohol, nicotine, and medications that can make it worse.  Do activities that provide mental stimulation like discussions, needlework, and video games. These may be helpful if you are not able to walk or  stretch. Some medications are effective in relieving the symptoms. However, many of these medications have side effects. Ask your caregiver about medications that may help your symptoms. Correcting iron deficiency may improve symptoms for some patients. Document Released: 01/11/2002 Document Revised: 04/15/2011 Document Reviewed: 04/19/2010 Exit[employer] Patient Information 2014 [employer].  

## 2013-05-12 NOTE — Progress Notes (Signed)
   Subjective:    Patient ID: Tamara Mccullough, female    DOB: November 12, 1955, 58 y.o.   MRN: [id number]  HPI Patient in today to have disability papers filled out- She is also c/o anxiety- She is currently on Seroquel 150mg  daily- [facility] also on xanax 0.5 mg 2 po qhs to help with muscle twitching all over. Says that seroquel has caused RLS * she overdosed on ambien several months ago and had to be hospitalized.    Review of Systems  Constitutional: Negative.   Respiratory: Negative.   Cardiovascular: Negative.   Psychiatric/Behavioral: Negative.   All other systems reviewed and are negative.      Objective:   Physical Exam  Constitutional: She is oriented to person, place, and time. She appears well-developed and well-nourished.  Cardiovascular: Normal rate, regular rhythm and normal heart sounds.   Pulmonary/Chest: Effort normal and breath sounds normal.  Neurological: She is alert and oriented to person, place, and time.  Skin: Skin is warm.  Psychiatric: She has a normal mood and affect. Her behavior is normal. Judgment and thought content normal.   BP 84/53  Pulse 68  Temp(Src) 98.1 F (36.7 C) (Oral)  Ht 5\' 4"  (1.626 m)  Wt 232 lb 9.6 oz (105.507 kg)  BMI 39.91 kg/m2        Assessment & Plan:   1. GAD (generalized anxiety disorder)   2. RLS (restless legs syndrome)    Meds ordered this encounter  Medications  . SEROQUEL XR 150 MG 24 hr tablet    Sig:   . pramipexole (MIRAPEX) 0.5 MG tablet    Sig: Take 1 tablet (0.5 mg total) by mouth at bedtime.    Dispense:  30 tablet    Refill:  2    Order Specific Question:  Supervising Provider    Answer:  Joycelyn Man   Refused to give patient xanax rx due to hx of overdose of Lorrin Mais- was told she would have to discuss with Dr. Lin Landsman. Follow up prn  Mary-Margaret Hassell Done, FNP

## 2013-05-13 DIAGNOSIS — M4802 Spinal stenosis, cervical region: Secondary | ICD-10-CM | POA: Diagnosis not present

## 2013-05-13 DIAGNOSIS — Z6836 Body mass index (BMI) 36.0-36.9, adult: Secondary | ICD-10-CM | POA: Diagnosis not present

## 2013-05-14 ENCOUNTER — Telehealth (INDEPENDENT_AMBULATORY_CARE_PROVIDER_SITE_OTHER): Payer: Self-pay

## 2013-05-14 NOTE — Telephone Encounter (Signed)
Pt being seen for thyroid goiter by Dr. Zella Richer.  Have questions.  Please ask for Kem Parcher.

## 2013-05-25 DIAGNOSIS — F0630 Mood disorder due to known physiological condition, unspecified: Secondary | ICD-10-CM | POA: Diagnosis not present

## 2013-05-25 DIAGNOSIS — F3189 Other bipolar disorder: Secondary | ICD-10-CM | POA: Diagnosis not present

## 2013-05-31 ENCOUNTER — Telehealth: Payer: Self-pay | Admitting: Nurse Practitioner

## 2013-06-04 ENCOUNTER — Encounter: Payer: Self-pay | Admitting: *Deleted

## 2013-06-07 ENCOUNTER — Ambulatory Visit: Payer: Medicare Other | Admitting: Nurse Practitioner

## 2013-06-09 ENCOUNTER — Encounter (INDEPENDENT_AMBULATORY_CARE_PROVIDER_SITE_OTHER): Payer: Self-pay | Admitting: General Surgery

## 2013-06-09 ENCOUNTER — Other Ambulatory Visit (INDEPENDENT_AMBULATORY_CARE_PROVIDER_SITE_OTHER): Payer: Medicare Other

## 2013-06-09 ENCOUNTER — Ambulatory Visit (INDEPENDENT_AMBULATORY_CARE_PROVIDER_SITE_OTHER): Payer: Medicare Other | Admitting: General Surgery

## 2013-06-09 VITALS — BP 120/70 | HR 80 | Temp 97.4°F | Resp 14 | Ht 65.0 in | Wt 234.2 lb

## 2013-06-09 DIAGNOSIS — E049 Nontoxic goiter, unspecified: Secondary | ICD-10-CM

## 2013-06-09 DIAGNOSIS — E042 Nontoxic multinodular goiter: Secondary | ICD-10-CM | POA: Diagnosis not present

## 2013-06-09 NOTE — Progress Notes (Signed)
Patient came in for labs only.

## 2013-06-09 NOTE — Progress Notes (Signed)
Patient ID: Tamara Mccullough, female   DOB: Aug 14, 1955, 58 y.o.   MRN: [id number]  Chief Complaint  Patient presents with  . New Evaluation    eval goiter    HPI Tamara Mccullough is a 58 y.o. female.   HPI  She is referred by Dr. Jeral Fruit because of a thyroid goiter. This was noted 2 years ago. It is a multinodular goiter. There was a change in the size of one of the nodules. This was biopsied in 2013 and was benign. She's had previous neck surgery for a posterior approach and needs repeat neck surgery. She tells me he wants to do an anterior approach but the goiter is in the way.  She is here to discuss thyroid surgery because of that. She does have some dysphagia at times. No specific compression symptoms except for that. No family history of thyroid disease or cancer. No exposure to ionizing radiation.  Past Medical History  Diagnosis Date  . Anemia   . Depression   . Diabetes mellitus, type 2   . Asthma   . Depression   . Morbid obesity   . Edema   . Hypertension   . Chronic back pain   . Peripheral neuropathy   . Seizures   . Hyperlipidemia   . Panic attacks   . Insomnia   . Restless leg syndrome     Past Surgical History  Procedure Laterality Date  . 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 Tubal ligation    . Back surgery      Family History  Problem Relation Age of Onset  . Congestive Heart Failure Mother   . Diabetes Mother   . Cancer Mother   . Congestive Heart Failure Father   . Emphysema Father   . Alcohol abuse Father   . Alcohol abuse Sister   . Alcohol abuse Brother   . Alcohol abuse Paternal Grandfather   . Alcohol abuse Brother   . Alcohol abuse Brother     Social History History  Substance Use Topics  . Smoking status: Former Games developer  . Smokeless tobacco: Not on file  . Alcohol Use: 7.2 oz/week    12 Cans of beer per week    Allergies  Allergen Reactions  . Alcohol-Sulfur [Sulfur] Other (See Comments)    Recovering alcoholic; takes no meds with alcohol  in it.  . Eszopiclone   . Trazodone And Nefazodone Hypertension  . Codeine Itching, Nausea And Vomiting and Rash  . Lansoprazole Palpitations  . Motrin [Ibuprofen] Nausea And Vomiting  . Prilosec [Omeprazole] Palpitations and Other (See Comments)    Makes heart beat fast.    Current Outpatient Prescriptions  Medication Sig Dispense Refill  . FLUoxetine (PROZAC) 20 MG tablet Take 60 mg by mouth daily.       Marland Kitchen gabapentin (NEURONTIN) 100 MG capsule Take 300 mg by mouth 3 (three) times daily.      . hydrOXYzine (ATARAX) 50 MG tablet Take 25 mg by mouth 4 (four) times daily as needed.       Marland Kitchen lisinopril-hydrochlorothiazide (PRINZIDE,ZESTORETIC) 10-12.5 MG per tablet Take 1 tablet by mouth daily.  90 tablet  1  . rosuvastatin (CRESTOR) 20 MG tablet Take 0.5 tablets (10 mg total) by mouth daily.  30 tablet  0  . temazepam (RESTORIL) 30 MG capsule Take 30 mg by mouth at bedtime.      . traMADol (ULTRAM) 50 MG tablet Take 50 mg by mouth every 6 (six) hours as  needed for pain.      Marland Kitchen lamoTRIgine (LAMICTAL) 100 MG tablet Take 150 mg by mouth daily.       . pramipexole (MIRAPEX) 0.5 MG tablet Take 1 tablet (0.5 mg total) by mouth at bedtime.  30 tablet  2   No current facility-administered medications for this visit.    Review of Systems Review of Systems  Constitutional:       Weight gain since starting gabapentin  Respiratory: Negative.   Cardiovascular: Negative.   Gastrointestinal:       Dumping-type symptoms after eating a lot of sugar  Psychiatric/Behavioral: The patient is nervous/anxious.     Blood pressure 120/70, pulse 80, temperature 97.4 F (36.3 C), temperature source Temporal, resp. rate 14, height 5\' 5"  (1.651 m), weight 234 lb 3.2 oz (106.232 kg).  Physical Exam Physical Exam  Constitutional: No distress.  Morbidly obese  HENT:  Head: Normocephalic and atraumatic.  Eyes: EOM are normal. No scleral icterus.  No exophthalmus  Neck: Thyromegaly (right >>left) present.   Cardiovascular: Normal rate and regular rhythm.   Pulmonary/Chest: Effort normal and breath sounds normal.  Abdominal: Soft. She exhibits no mass. There is no tenderness.  Obese  Musculoskeletal: She exhibits no edema.  Lymphadenopathy:    She has no cervical adenopathy.  Neurological: She is alert.  Skin: Skin is warm and dry.  Psychiatric: She has a normal mood and affect. Her behavior is normal.    Data Reviewed THYROID ULTRASOUND  Technique: Ultrasound examination of the thyroid gland and adjacent  soft tissues was performed.  Comparison: 02/07/2011 and earlier studies  Findings:  Right thyroid lobe: 27 x 32 x 57 mm, inhomogeneous  Left thyroid lobe: 17 x 21 x 52 mm  Isthmus: 3 mm in thickness  Focal nodules: 23 x 36 x 41 mm complex thick-walled cystic, mid-  right (23 x 26 x 37 at time of previous biopsy)  7 x 10 x 12 mm solid, right isthmus  8 x 9 x 10 mm solid, superior left  12 x 15 x 27 mm solid, mid-left  7 x 8 x 9 mm hypoechoic solid, deep inferior left  7 x 7 x 10 mm solid, inferior left  Lymphadenopathy: None visualized.  IMPRESSION:  1. Multiple bilateral thyroid lesions as above.  Biopsy of dominant right nodule in 2013 was benign     Assessment    Multinodular goiter. Previous biopsy in 2013 was benign. She is an anterior approach for cervical spine surgery in the goiter appears to be obstructing that approach     Plan    We'll check TSH level. We'll also discuss this with Dr. Jeral Fruit. We did talk about partial or total thyroidectomy. We went over the procedure, risks, side effects, and need for potential lifelong thyroid replacement treatment. We'll wait for the TSH results and conversation with Dr. Jeral Fruit before making final recommendations  The procedure and risks of thyroid surgery have been discussed.  Risks include but not limited to bleeding, infection, wound healing problems, presence of scar, anesthesia, injury to recurrent laryngeal nerve and  permanent hoarseness, permanent hypocalcemia, voice changes, dysphagia.  She seems to understand.        Jim Desanctis Carman Essick 06/09/2013, 10:21 AM

## 2013-06-09 NOTE — Patient Instructions (Signed)
We will call you regarding your blood test and after we speak with Dr. Joya Salm.

## 2013-06-10 LAB — TSH: TSH: 2.47 u[IU]/mL (ref 0.450–4.500)

## 2013-06-16 ENCOUNTER — Telehealth (INDEPENDENT_AMBULATORY_CARE_PROVIDER_SITE_OTHER): Payer: Self-pay

## 2013-06-16 NOTE — Telephone Encounter (Signed)
Pt made aware of normal TSH.  Dr. Zella Richer has spoken with Dr. Joya Salm about the patient, and will f/u with her with instructions for moving forward.

## 2013-06-23 ENCOUNTER — Other Ambulatory Visit (INDEPENDENT_AMBULATORY_CARE_PROVIDER_SITE_OTHER): Payer: Self-pay | Admitting: General Surgery

## 2013-06-23 DIAGNOSIS — E042 Nontoxic multinodular goiter: Secondary | ICD-10-CM

## 2013-06-30 ENCOUNTER — Telehealth (INDEPENDENT_AMBULATORY_CARE_PROVIDER_SITE_OTHER): Payer: Self-pay | Admitting: *Deleted

## 2013-06-30 ENCOUNTER — Ambulatory Visit
Admission: RE | Admit: 2013-06-30 | Discharge: 2013-06-30 | Disposition: A | Discharge status: Home / Self Care | Payer: 59 | Source: Ambulatory Visit | Attending: General Surgery | Admitting: General Surgery

## 2013-06-30 DIAGNOSIS — E042 Nontoxic multinodular goiter: Secondary | ICD-10-CM

## 2013-06-30 NOTE — Telephone Encounter (Signed)
Error

## 2013-07-01 ENCOUNTER — Telehealth: Payer: Self-pay | Admitting: Nurse Practitioner

## 2013-07-01 MED ORDER — ROSUVASTATIN CALCIUM 20 MG PO TABS: 10.0000 mg | ORAL_TABLET | Freq: Every day | ORAL | Status: DC

## 2013-07-01 NOTE — Telephone Encounter (Signed)
rx sent to pharmacy

## 2013-07-01 NOTE — Telephone Encounter (Signed)
Needs crestor sent to [facility] we are out of samples

## 2013-07-02 ENCOUNTER — Encounter (INDEPENDENT_AMBULATORY_CARE_PROVIDER_SITE_OTHER): Payer: Self-pay | Admitting: General Surgery

## 2013-07-02 ENCOUNTER — Other Ambulatory Visit (INDEPENDENT_AMBULATORY_CARE_PROVIDER_SITE_OTHER): Payer: Self-pay | Admitting: General Surgery

## 2013-07-02 DIAGNOSIS — E041 Nontoxic single thyroid nodule: Secondary | ICD-10-CM

## 2013-07-02 NOTE — Progress Notes (Unsigned)
Patient ID: Tamara Mccullough, female   DOB: 1955/11/05, 58 y.o.   MRN: [id number] FINDINGS:  Right thyroid lobe  Measurements: 6.5 x 3.2 x 3.0 cm. Right mid complex solid and cystic  nodule measuring 4.1 x 2.3 x 3.6 cm. Previously, this measured 2.3 x  3.6 x 4.1 cm. Adjacent medial small nodule is solid with  calcifications measuring 1.1 x 0.6 x 1.1 cm. Previously, this  measured 0.7 x 1.0 x 1.2 cm.  Left thyroid lobe  Measurements: 4.9 x 2.1 x 1.8 cm. Multiple left-sided nodules are  present and most are less than 1.0 cm. Solid left mid nodule  measures 2.7 x 1.5 x 1.4 cm and previously was measured at 2.7 x 1.5  x 1.2 cm  Isthmus  Thickness: 2 mm. No nodules visualized.  Lymphadenopathy  None visualized.  IMPRESSION:  Stable bilateral nodules. Stability over 2 years supports benign  etiology.    The results of her thyroid ultrasound are above. I discussed this with Dr. Joya Salm. I would like to only do a partial thyroidectomy in her so that she would not have to take thyroid hormone replacement and would have a decreased risk of hypocalcemia. Thus we will schedule a fine needle aspiration biopsy of a solid left mid thyroid nodule. If this is benign, would plan on doing a right thyroid lobectomy.  If it is atypical or worrisome, then we will proceed with a total thyroidectomy.  I discussed this with Ms. Berline Lopes and she is in agreement.

## 2013-07-07 ENCOUNTER — Encounter (INDEPENDENT_AMBULATORY_CARE_PROVIDER_SITE_OTHER): Payer: Self-pay

## 2013-07-14 ENCOUNTER — Other Ambulatory Visit (HOSPITAL_COMMUNITY)
Admission: RE | Admit: 2013-07-14 | Discharge: 2013-07-14 | Disposition: A | Discharge status: Home / Self Care | Payer: Medicare Other | Source: Ambulatory Visit | Attending: Interventional Radiology | Admitting: Interventional Radiology

## 2013-07-14 ENCOUNTER — Ambulatory Visit
Admission: RE | Admit: 2013-07-14 | Discharge: 2013-07-14 | Disposition: A | Discharge status: Home / Self Care | Payer: 59 | Source: Ambulatory Visit | Attending: General Surgery | Admitting: General Surgery

## 2013-07-14 DIAGNOSIS — E042 Nontoxic multinodular goiter: Secondary | ICD-10-CM | POA: Diagnosis not present

## 2013-07-14 DIAGNOSIS — E041 Nontoxic single thyroid nodule: Secondary | ICD-10-CM | POA: Insufficient documentation

## 2013-07-16 ENCOUNTER — Encounter (INDEPENDENT_AMBULATORY_CARE_PROVIDER_SITE_OTHER): Payer: Self-pay | Admitting: General Surgery

## 2013-07-16 NOTE — Progress Notes (Signed)
Patient ID: Tamara Mccullough, female   DOB: September 11, 1955, 58 y.o.   MRN: [id number] [facility] pathology on left thyroid lobe nodule is consistent with a non-neoplastic goiter.  Discussed this with her.  Will proceed with scheduling a right thyroid lobectomy.

## 2013-07-26 NOTE — Patient Instructions (Signed)
HELIA HAESE  07/26/2013   Your procedure is scheduled on:  08/12/2013  1123am-1253pm  Report to [facility].  Follow the Signs to [place] at [phone]       am  Call this number if you have problems the morning of surgery: [phone]   Remember:   Do not eat food or drink liquids after midnight.   Take these medicines the morning of surgery with A SIP OF WATER:    Do not wear jewelry, make-up or nail polish.  Do not wear lotions, powders, or perfumes, deodorant .  Do not shave 48 hours prior to surgery.   Do not bring valuables to the hospital.  Contacts, dentures or bridgework may not be worn into surgery.  Leave suitcase in the car. After surgery it may be brought to your room.  For patients admitted to the hospital, checkout time is 11:00 AM the day of  discharge.        Please read over the following fact sheets that you were given: [facility] - Preparing for Surgery Before surgery, you can play an important role.  Because skin is not sterile, your skin needs to be as free of germs as possible.  You can reduce the number of germs on your skin by washing with CHG (chlorahexidine gluconate) soap before surgery.  CHG is an antiseptic cleaner which kills germs and bonds with the skin to continue killing germs even after washing. Please DO NOT use if you have an allergy to CHG or antibacterial soaps.  If your skin becomes reddened/irritated stop using the CHG and inform your nurse when you arrive at Short Stay. Do not shave (including legs and underarms) for at least 48 hours prior to the first CHG shower.  You may shave your face/neck. Please follow these instructions carefully:  1.  Shower with CHG Soap the night before surgery and the  morning of Surgery.  2.  If you choose to wash your hair, wash your hair first as usual with your  normal  shampoo.  3.  After you shampoo, rinse your hair and body thoroughly to remove the  shampoo.                           4.   Use CHG as you would any other liquid soap.  You can apply chg directly  to the skin and wash                       Gently with a scrungie or clean washcloth.  5.  Apply the CHG Soap to your body ONLY FROM THE NECK DOWN.   Do not use on face/ open                           Wound or open sores. Avoid contact with eyes, ears mouth and genitals (private parts).                       Wash face,  Genitals (private parts) with your normal soap.             6.  Wash thoroughly, paying special attention to the area where your surgery  will be performed.  7.  Thoroughly rinse your body with warm water from the neck down.  8.  DO NOT shower/wash with your normal soap after  using and rinsing off  the CHG Soap.                9.  Pat yourself dry with a clean towel.            10.  Wear clean pajamas.            11.  Place clean sheets on your bed the night of your first shower and do not  sleep with pets. Day of Surgery : Do not apply any lotions/deodorants the morning of surgery.  Please wear clean clothes to the hospital/surgery center.  FAILURE TO FOLLOW THESE INSTRUCTIONS MAY RESULT IN THE CANCELLATION OF YOUR SURGERY PATIENT SIGNATURE_________________________________  NURSE SIGNATURE__________________________________  ________________________________________________________________________  coughing and deep breathing exercises, leg exercises

## 2013-07-27 ENCOUNTER — Encounter (HOSPITAL_COMMUNITY): Payer: Self-pay | Admitting: Pharmacy Technician

## 2013-07-28 ENCOUNTER — Ambulatory Visit (HOSPITAL_COMMUNITY)
Admission: RE | Admit: 2013-07-28 | Discharge: 2013-07-28 | Disposition: A | Discharge status: Home / Self Care | Payer: Medicare Other | Source: Ambulatory Visit | Attending: Anesthesiology | Admitting: Anesthesiology

## 2013-07-28 ENCOUNTER — Encounter (HOSPITAL_COMMUNITY)
Admission: RE | Admit: 2013-07-28 | Discharge: 2013-07-28 | Disposition: A | Discharge status: Home / Self Care | Payer: Medicare Other | Source: Ambulatory Visit | Attending: General Surgery | Admitting: General Surgery

## 2013-07-28 ENCOUNTER — Encounter (HOSPITAL_COMMUNITY): Payer: Self-pay

## 2013-07-28 DIAGNOSIS — Z01818 Encounter for other preprocedural examination: Secondary | ICD-10-CM | POA: Diagnosis not present

## 2013-07-28 DIAGNOSIS — Z01812 Encounter for preprocedural laboratory examination: Secondary | ICD-10-CM | POA: Diagnosis not present

## 2013-07-28 HISTORY — DX: Nausea with vomiting, unspecified: R11.2

## 2013-07-28 HISTORY — DX: Unspecified osteoarthritis, unspecified site: M19.90

## 2013-07-28 HISTORY — DX: Other specified postprocedural states: Z98.890

## 2013-07-28 HISTORY — DX: Sleep apnea, unspecified: G47.30

## 2013-07-28 LAB — CBC WITH DIFFERENTIAL/PLATELET
BASOS ABS: 0 10*3/uL (ref 0.0–0.1)
BASOS PCT: 1 % (ref 0–1)
EOS PCT: 4 % (ref 0–5)
Eosinophils Absolute: 0.3 10*3/uL (ref 0.0–0.7)
HEMATOCRIT: 36.6 % (ref 36.0–46.0)
Hemoglobin: 11.5 g/dL — ABNORMAL LOW (ref 12.0–15.0)
Lymphocytes Relative: 37 % (ref 12–46)
Lymphs Abs: 2.4 10*3/uL (ref 0.7–4.0)
MCH: 27.1 pg (ref 26.0–34.0)
MCHC: 31.4 g/dL (ref 30.0–36.0)
MCV: 86.3 fL (ref 78.0–100.0)
MONO ABS: 0.5 10*3/uL (ref 0.1–1.0)
Monocytes Relative: 8 % (ref 3–12)
Neutro Abs: 3.3 10*3/uL (ref 1.7–7.7)
Neutrophils Relative %: 50 % (ref 43–77)
PLATELETS: 224 10*3/uL (ref 150–400)
RBC: 4.24 MIL/uL (ref 3.87–5.11)
RDW: 14.7 % (ref 11.5–15.5)
WBC: 6.5 10*3/uL (ref 4.0–10.5)

## 2013-07-28 LAB — COMPREHENSIVE METABOLIC PANEL
ALBUMIN: 3.7 g/dL (ref 3.5–5.2)
ALT: 27 U/L (ref 0–35)
AST: 31 U/L (ref 0–37)
Alkaline Phosphatase: 98 U/L (ref 39–117)
BUN: 17 mg/dL (ref 6–23)
CALCIUM: 8.7 mg/dL (ref 8.4–10.5)
CO2: 29 mEq/L (ref 19–32)
CREATININE: 0.88 mg/dL (ref 0.50–1.10)
Chloride: 102 mEq/L (ref 96–112)
GFR calc Af Amer: 82 mL/min — ABNORMAL LOW (ref >90)
GFR calc non Af Amer: 71 mL/min — ABNORMAL LOW (ref >90)
Glucose, Bld: 88 mg/dL (ref 70–99)
Potassium: 4.5 mEq/L (ref 3.7–5.3)
SODIUM: 141 meq/L (ref 137–147)
Total Bilirubin: 0.3 mg/dL (ref 0.3–1.2)
Total Protein: 6.5 g/dL (ref 6.0–8.3)

## 2013-07-28 LAB — PROTIME-INR
INR: 0.97 (ref 0.00–1.49)
Prothrombin Time: 12.9 seconds (ref 11.6–15.2)

## 2013-07-28 NOTE — Progress Notes (Signed)
EKG 03/05/13 EPIC

## 2013-07-30 ENCOUNTER — Other Ambulatory Visit: Payer: Self-pay | Admitting: *Deleted

## 2013-07-30 MED ORDER — PRAMIPEXOLE DIHYDROCHLORIDE 0.5 MG PO TABS: 0.5000 mg | ORAL_TABLET | Freq: Every day | ORAL | Status: DC

## 2013-08-09 ENCOUNTER — Ambulatory Visit (INDEPENDENT_AMBULATORY_CARE_PROVIDER_SITE_OTHER): Payer: Medicare Other | Admitting: Nurse Practitioner

## 2013-08-09 ENCOUNTER — Encounter: Payer: Self-pay | Admitting: Nurse Practitioner

## 2013-08-09 VITALS — BP 122/84 | HR 68 | Temp 98.1°F | Ht 64.0 in | Wt 239.0 lb

## 2013-08-09 DIAGNOSIS — F411 Generalized anxiety disorder: Secondary | ICD-10-CM

## 2013-08-09 DIAGNOSIS — E669 Obesity, unspecified: Secondary | ICD-10-CM

## 2013-08-09 DIAGNOSIS — F1020 Alcohol dependence, uncomplicated: Secondary | ICD-10-CM

## 2013-08-09 DIAGNOSIS — E538 Deficiency of other specified B group vitamins: Secondary | ICD-10-CM

## 2013-08-09 DIAGNOSIS — E785 Hyperlipidemia, unspecified: Secondary | ICD-10-CM

## 2013-08-09 DIAGNOSIS — I10 Essential (primary) hypertension: Secondary | ICD-10-CM | POA: Diagnosis not present

## 2013-08-09 DIAGNOSIS — R635 Abnormal weight gain: Secondary | ICD-10-CM

## 2013-08-09 DIAGNOSIS — G2581 Restless legs syndrome: Secondary | ICD-10-CM

## 2013-08-09 LAB — POCT CBC
GRANULOCYTE PERCENT: 67.1 % (ref 37–80)
HCT, POC: 38.4 % (ref 37.7–47.9)
Hemoglobin: 12.2 g/dL (ref 12.2–16.2)
LYMPH, POC: 1.9 (ref 0.6–3.4)
MCH, POC: 26.5 pg — AB (ref 27–31.2)
MCHC: 31.8 g/dL (ref 31.8–35.4)
MCV: 83.3 fL (ref 80–97)
MPV: 9.7 fL (ref 0–99.8)
PLATELET COUNT, POC: 206 10*3/uL (ref 142–424)
POC Granulocyte: 4.1 (ref 2–6.9)
POC LYMPH PERCENT: 31.5 %L (ref 10–50)
RBC: 4.6 M/uL (ref 4.04–5.48)
RDW, POC: 14.9 %
WBC: 6.1 10*3/uL (ref 4.6–10.2)

## 2013-08-09 MED ORDER — PRAMIPEXOLE DIHYDROCHLORIDE 0.5 MG PO TABS: 0.5000 mg | ORAL_TABLET | Freq: Every day | ORAL | Status: DC

## 2013-08-09 NOTE — Progress Notes (Signed)
Subjective:    Patient ID: Tamara Mccullough, female    DOB: Aug 16, 1955, 58 y.o.   MRN: [id number]\  Patient here today for follow up of chronic medical problems.  Hypertension This is a chronic problem. The current episode started more than 1 year ago. The problem has been resolved since onset. The problem is controlled. Associated symptoms include anxiety. Pertinent negatives include no chest pain, peripheral edema or shortness of breath. Neck pain: chronic. Agents associated with hypertension include decongestants. Risk factors for coronary artery disease include dyslipidemia, obesity, post-menopausal state and sedentary lifestyle. Past treatments include ACE inhibitors. The current treatment provides moderate improvement. Compliance problems include diet.  There is no history of heart failure.  Anxiety Presents for follow-up visit. Symptoms include depressed mood, insomnia, malaise and muscle tension. Patient reports no chest pain, shortness of breath or suicidal ideas. Symptoms occur occasionally. The severity of symptoms is moderate. The quality of sleep is poor.   Compliance with medications is 76-100%.  Hyperlipidemia This is a chronic problem. The current episode started more than 1 year ago. Exacerbating diseases include obesity. She has no history of diabetes. Factors aggravating her hyperlipidemia include fatty foods. Associated symptoms include myalgias. Pertinent negatives include no chest pain or shortness of breath. She is currently on no antihyperlipidemic treatment. Compliance problems include adherence to diet.  Risk factors for coronary artery disease include post-menopausal, hypertension, dyslipidemia and obesity.  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 RLS Ptatient on pramipexole- helps some  *Patient having surgery this Thursday for removal of goiter- after that she is going to have a cervical fusion.  Review of Systems  HENT: Positive for congestion, ear  pain and sinus pressure.   Respiratory: Positive for cough. Negative for shortness of breath.   Cardiovascular: Negative for chest pain.  Musculoskeletal: Positive for back pain (chronic), joint swelling and myalgias. Neck pain: chronic.  Psychiatric/Behavioral: Negative for suicidal ideas. The patient has insomnia.   All other systems reviewed and are negative.      Objective:   Physical Exam  Constitutional: She is oriented to person, place, and time. She appears well-developed and well-nourished.  HENT:  Head: Normocephalic.  Right Ear: Hearing, tympanic membrane, external ear and ear canal normal.  Left Ear: Hearing, tympanic membrane, external ear and ear canal normal.  Nose: Nose normal.  Mouth/Throat: Uvula is midline, oropharynx is clear and moist and mucous membranes are normal.  Eyes: Conjunctivae and EOM are normal. Pupils are equal, round, and reactive to light.  Neck: Normal range of motion. Neck supple. No JVD present. Erythema present. No thyromegaly present.  Cardiovascular: Normal rate, normal heart sounds and intact distal pulses.   No murmur heard. Pulmonary/Chest: Effort normal and breath sounds normal. She has no wheezes. She has no rales.  Abdominal: Soft. Bowel sounds are normal. She exhibits no mass.  Musculoskeletal: Normal range of motion.  Neurological: She is alert and oriented to person, place, and time. She has normal reflexes.  Skin: Skin is warm and dry.  Psychiatric: She has a normal mood and affect. Her behavior is normal. Judgment and thought content normal.    BP 122/84  Pulse 68  Temp(Src) 98.1 F (36.7 C) (Oral)  Ht '5\' 4"'  (1.626 m)  Wt 239 lb (108.41 kg)  BMI 41.00 kg/m2       Assessment & Plan:   1. Hyperlipidemia with target LDL less than 100   2. GAD (generalized anxiety disorder)   3. Essential hypertension, benign  4. B12 DEFICIENCY   5. Uncomplicated alcohol dependence   6. OBESITY   7. RLS (restless legs syndrome)     Orders Placed This Encounter  Procedures  . CMP14+EGFR  . NMR, lipoprofile   Meds ordered this encounter  Medications  . pramipexole (MIRAPEX) 0.5 MG tablet    Sig: Take 1 tablet (0.5 mg total) by mouth at bedtime.    Dispense:  90 tablet    Refill:  1    Order Specific Question:  Supervising Provider    Answer:  Chipper Herb [[id number]]   Patient encouraged to get mammo Labs pending Health maintenance reviewed Diet and exercise encouraged Continue all meds Follow up  In 3 months   Nice, FNP

## 2013-08-09 NOTE — Patient Instructions (Signed)

## 2013-08-10 LAB — CMP14+EGFR
A/G RATIO: 2.4 (ref 1.1–2.5)
ALT: 19 IU/L (ref 0–32)
AST: 23 IU/L (ref 0–40)
Albumin: 4.5 g/dL (ref 3.5–5.5)
Alkaline Phosphatase: 92 IU/L (ref 39–117)
BUN/Creatinine Ratio: 22 (ref 9–23)
BUN: 17 mg/dL (ref 6–24)
CO2: 22 mmol/L (ref 18–29)
CREATININE: 0.77 mg/dL (ref 0.57–1.00)
Calcium: 9.1 mg/dL (ref 8.7–10.2)
Chloride: 100 mmol/L (ref 97–108)
GFR calc Af Amer: 98 mL/min/{1.73_m2} (ref >59)
GFR, EST NON AFRICAN AMERICAN: 85 mL/min/{1.73_m2} (ref >59)
GLOBULIN, TOTAL: 1.9 g/dL (ref 1.5–4.5)
GLUCOSE: 100 mg/dL — AB (ref 65–99)
Potassium: 4.3 mmol/L (ref 3.5–5.2)
Sodium: 138 mmol/L (ref 134–144)
TOTAL PROTEIN: 6.4 g/dL (ref 6.0–8.5)
Total Bilirubin: 0.4 mg/dL (ref 0.0–1.2)

## 2013-08-10 LAB — NMR, LIPOPROFILE
Cholesterol: 145 mg/dL (ref 100–199)
HDL CHOLESTEROL BY NMR: 68 mg/dL (ref >39)
HDL PARTICLE NUMBER: 44.4 umol/L (ref >=30.5)
LDL Particle Number: 981 nmol/L (ref <1000)
LDL Size: 20.7 nm (ref >20.5)
LDLC SERPL CALC-MCNC: 61 mg/dL (ref 0–99)
LP-IR Score: <25 (ref <=45)
SMALL LDL PARTICLE NUMBER: 547 nmol/L — AB (ref <=527)
Triglycerides by NMR: 79 mg/dL (ref 0–149)

## 2013-08-12 ENCOUNTER — Encounter (HOSPITAL_COMMUNITY): Payer: Medicare Other | Admitting: Anesthesiology

## 2013-08-12 ENCOUNTER — Encounter (HOSPITAL_COMMUNITY)
Admission: RE | Disposition: A | Discharge status: Home / Self Care | Payer: Self-pay | Source: Ambulatory Visit | Attending: General Surgery

## 2013-08-12 ENCOUNTER — Ambulatory Visit (HOSPITAL_COMMUNITY)
Admission: RE | Admit: 2013-08-12 | Discharge: 2013-08-13 | Disposition: A | Discharge status: Home / Self Care | Payer: Medicare Other | Source: Ambulatory Visit | Attending: General Surgery | Admitting: General Surgery

## 2013-08-12 ENCOUNTER — Encounter (HOSPITAL_COMMUNITY): Payer: Self-pay | Admitting: *Deleted

## 2013-08-12 ENCOUNTER — Ambulatory Visit (HOSPITAL_COMMUNITY): Payer: Medicare Other | Admitting: Anesthesiology

## 2013-08-12 DIAGNOSIS — F1410 Cocaine abuse, uncomplicated: Secondary | ICD-10-CM | POA: Insufficient documentation

## 2013-08-12 DIAGNOSIS — E042 Nontoxic multinodular goiter: Secondary | ICD-10-CM | POA: Diagnosis not present

## 2013-08-12 DIAGNOSIS — Z87891 Personal history of nicotine dependence: Secondary | ICD-10-CM | POA: Insufficient documentation

## 2013-08-12 DIAGNOSIS — E785 Hyperlipidemia, unspecified: Secondary | ICD-10-CM | POA: Diagnosis not present

## 2013-08-12 DIAGNOSIS — I10 Essential (primary) hypertension: Secondary | ICD-10-CM | POA: Insufficient documentation

## 2013-08-12 DIAGNOSIS — D34 Benign neoplasm of thyroid gland: Secondary | ICD-10-CM | POA: Diagnosis not present

## 2013-08-12 DIAGNOSIS — E041 Nontoxic single thyroid nodule: Secondary | ICD-10-CM | POA: Diagnosis not present

## 2013-08-12 DIAGNOSIS — Z9884 Bariatric surgery status: Secondary | ICD-10-CM | POA: Insufficient documentation

## 2013-08-12 DIAGNOSIS — E049 Nontoxic goiter, unspecified: Secondary | ICD-10-CM | POA: Diagnosis present

## 2013-08-12 DIAGNOSIS — M1990 Unspecified osteoarthritis, unspecified site: Secondary | ICD-10-CM | POA: Diagnosis not present

## 2013-08-12 DIAGNOSIS — G4730 Sleep apnea, unspecified: Secondary | ICD-10-CM | POA: Insufficient documentation

## 2013-08-12 DIAGNOSIS — M549 Dorsalgia, unspecified: Secondary | ICD-10-CM | POA: Diagnosis not present

## 2013-08-12 DIAGNOSIS — G40909 Epilepsy, unspecified, not intractable, without status epilepticus: Secondary | ICD-10-CM | POA: Insufficient documentation

## 2013-08-12 DIAGNOSIS — Z79899 Other long term (current) drug therapy: Secondary | ICD-10-CM | POA: Insufficient documentation

## 2013-08-12 HISTORY — PX: THYROID LOBECTOMY: SHX420

## 2013-08-12 SURGERY — LOBECTOMY, THYROID
Anesthesia: General | Site: Neck | Laterality: Right

## 2013-08-12 MED ORDER — MIDAZOLAM HCL 5 MG/5ML IJ SOLN
INTRAMUSCULAR | Status: DC | PRN
  Administered 2013-08-12: 2 mg via INTRAVENOUS

## 2013-08-12 MED ORDER — KETOROLAC TROMETHAMINE 30 MG/ML IJ SOLN
30.0000 mg | Freq: Once | INTRAMUSCULAR | Status: AC
  Administered 2013-08-12: 30 mg via INTRAVENOUS

## 2013-08-12 MED ORDER — HYDROMORPHONE HCL PF 2 MG/ML IJ SOLN
INTRAMUSCULAR | Status: AC
  Filled 2013-08-12: qty 1

## 2013-08-12 MED ORDER — FENTANYL CITRATE 0.05 MG/ML IJ SOLN
25.0000 ug | INTRAMUSCULAR | Status: DC | PRN
Start: 2013-08-12 — End: 2013-08-12
  Administered 2013-08-12: 50 ug via INTRAVENOUS

## 2013-08-12 MED ORDER — KETOROLAC TROMETHAMINE 30 MG/ML IJ SOLN
INTRAMUSCULAR | Status: AC
  Filled 2013-08-12: qty 1

## 2013-08-12 MED ORDER — ONDANSETRON HCL 4 MG/2ML IJ SOLN
INTRAMUSCULAR | Status: AC
  Filled 2013-08-12: qty 2

## 2013-08-12 MED ORDER — DOCUSATE SODIUM 50 MG PO CAPS: 150.0000 mg | ORAL_CAPSULE | Freq: Every day | ORAL | Status: DC

## 2013-08-12 MED ORDER — HYDROMORPHONE HCL PF 1 MG/ML IJ SOLN
INTRAMUSCULAR | Status: DC | PRN
  Administered 2013-08-12: 0.5 mg via INTRAVENOUS

## 2013-08-12 MED ORDER — LIDOCAINE HCL 4 % MT SOLN
OROMUCOSAL | Status: DC | PRN
  Administered 2013-08-12: 4 mL via TOPICAL

## 2013-08-12 MED ORDER — LACTATED RINGERS IV SOLN
INTRAVENOUS | Status: DC
Start: 2013-08-12 — End: 2013-08-12
  Administered 2013-08-12: 1000 mL via INTRAVENOUS
  Administered 2013-08-12: 13:00:00 via INTRAVENOUS

## 2013-08-12 MED ORDER — CEFAZOLIN SODIUM-DEXTROSE 2-3 GM-% IV SOLR
2.0000 g | INTRAVENOUS | Status: AC
  Administered 2013-08-12: 2 g via INTRAVENOUS

## 2013-08-12 MED ORDER — DOCUSATE SODIUM 100 MG PO CAPS
100.0000 mg | ORAL_CAPSULE | Freq: Every day | ORAL | Status: DC
  Administered 2013-08-12: 100 mg via ORAL
  Filled 2013-08-12 (×2): qty 1

## 2013-08-12 MED ORDER — PROPOFOL 10 MG/ML IV BOLUS
INTRAVENOUS | Status: DC | PRN
  Administered 2013-08-12: 200 mg via INTRAVENOUS

## 2013-08-12 MED ORDER — FLUOXETINE HCL 20 MG PO TABS
60.0000 mg | ORAL_TABLET | Freq: Every morning | ORAL | Status: DC
  Filled 2013-08-12: qty 3

## 2013-08-12 MED ORDER — LIDOCAINE HCL (CARDIAC) 20 MG/ML IV SOLN
INTRAVENOUS | Status: DC | PRN
  Administered 2013-08-12: 40 mg via INTRAVENOUS

## 2013-08-12 MED ORDER — GLYCOPYRROLATE 0.2 MG/ML IJ SOLN
INTRAMUSCULAR | Status: DC | PRN
  Administered 2013-08-12: 0.6 mg via INTRAVENOUS

## 2013-08-12 MED ORDER — ONDANSETRON HCL 4 MG PO TABS: 4.0000 mg | ORAL_TABLET | Freq: Four times a day (QID) | ORAL | Status: DC | PRN

## 2013-08-12 MED ORDER — FENTANYL CITRATE 0.05 MG/ML IJ SOLN
INTRAMUSCULAR | Status: AC
  Filled 2013-08-12: qty 2

## 2013-08-12 MED ORDER — ACETAMINOPHEN 10 MG/ML IV SOLN
1000.0000 mg | Freq: Once | INTRAVENOUS | Status: AC
  Administered 2013-08-12: 1000 mg via INTRAVENOUS
  Filled 2013-08-12: qty 100

## 2013-08-12 MED ORDER — LABETALOL HCL 5 MG/ML IV SOLN
INTRAVENOUS | Status: DC | PRN
  Administered 2013-08-12: 5 mg via INTRAVENOUS

## 2013-08-12 MED ORDER — NEOSTIGMINE METHYLSULFATE 10 MG/10ML IV SOLN
INTRAVENOUS | Status: DC | PRN
  Administered 2013-08-12: 4 mg via INTRAVENOUS

## 2013-08-12 MED ORDER — TEMAZEPAM 15 MG PO CAPS
30.0000 mg | ORAL_CAPSULE | Freq: Every day | ORAL | Status: DC
  Administered 2013-08-12: 30 mg via ORAL
  Filled 2013-08-12: qty 2

## 2013-08-12 MED ORDER — ROCURONIUM BROMIDE 100 MG/10ML IV SOLN
INTRAVENOUS | Status: DC | PRN
  Administered 2013-08-12: 50 mg via INTRAVENOUS

## 2013-08-12 MED ORDER — KCL-LACTATED RINGERS-D5W 20 MEQ/L IV SOLN
INTRAVENOUS | Status: DC
  Administered 2013-08-12: 16:00:00 via INTRAVENOUS
  Administered 2013-08-13: 75 mL via INTRAVENOUS
  Filled 2013-08-12 (×3): qty 1000

## 2013-08-12 MED ORDER — OXYCODONE HCL 5 MG PO TABS
5.0000 mg | ORAL_TABLET | ORAL | Status: DC | PRN
  Administered 2013-08-12: 5 mg via ORAL
  Administered 2013-08-12: 10 mg via ORAL
  Filled 2013-08-12: qty 2
  Filled 2013-08-12: qty 1

## 2013-08-12 MED ORDER — LIDOCAINE HCL (CARDIAC) 20 MG/ML IV SOLN
INTRAVENOUS | Status: AC
  Filled 2013-08-12: qty 5

## 2013-08-12 MED ORDER — ONDANSETRON HCL 4 MG/2ML IJ SOLN
INTRAMUSCULAR | Status: DC | PRN
  Administered 2013-08-12: 4 mg via INTRAVENOUS

## 2013-08-12 MED ORDER — FENTANYL CITRATE 0.05 MG/ML IJ SOLN
INTRAMUSCULAR | Status: DC | PRN
  Administered 2013-08-12: 100 ug via INTRAVENOUS
  Administered 2013-08-12 (×3): 50 ug via INTRAVENOUS

## 2013-08-12 MED ORDER — HYDROMORPHONE HCL PF 1 MG/ML IJ SOLN
0.5000 mg | INTRAMUSCULAR | Status: DC | PRN
  Administered 2013-08-12 – 2013-08-13 (×5): 1 mg via INTRAVENOUS
  Filled 2013-08-12 (×5): qty 1

## 2013-08-12 MED ORDER — DEXAMETHASONE SODIUM PHOSPHATE 10 MG/ML IJ SOLN
INTRAMUSCULAR | Status: DC | PRN
  Administered 2013-08-12: 10 mg via INTRAVENOUS

## 2013-08-12 MED ORDER — ONDANSETRON HCL 4 MG/2ML IJ SOLN: 4.0000 mg | INTRAMUSCULAR | Status: DC | PRN

## 2013-08-12 MED ORDER — CEFAZOLIN SODIUM-DEXTROSE 2-3 GM-% IV SOLR
INTRAVENOUS | Status: AC
  Filled 2013-08-12: qty 50

## 2013-08-12 MED ORDER — FENTANYL CITRATE 0.05 MG/ML IJ SOLN
INTRAMUSCULAR | Status: AC
  Filled 2013-08-12: qty 5

## 2013-08-12 MED ORDER — PROMETHAZINE HCL 25 MG/ML IJ SOLN: 6.2500 mg | INTRAMUSCULAR | Status: DC | PRN

## 2013-08-12 MED ORDER — PROPOFOL 10 MG/ML IV BOLUS
INTRAVENOUS | Status: AC
  Filled 2013-08-12: qty 20

## 2013-08-12 MED ORDER — MIDAZOLAM HCL 2 MG/2ML IJ SOLN
INTRAMUSCULAR | Status: AC
  Filled 2013-08-12: qty 2

## 2013-08-12 MED ORDER — HYDROMORPHONE HCL PF 1 MG/ML IJ SOLN
0.2500 mg | INTRAMUSCULAR | Status: DC | PRN
  Administered 2013-08-12: 0.25 mg via INTRAVENOUS
  Administered 2013-08-12 (×3): 0.5 mg via INTRAVENOUS
  Administered 2013-08-12: 0.25 mg via INTRAVENOUS

## 2013-08-12 MED ORDER — HYDROXYZINE HCL 10 MG PO TABS
10.0000 mg | ORAL_TABLET | Freq: Three times a day (TID) | ORAL | Status: DC
  Administered 2013-08-12 (×2): 10 mg via ORAL
  Filled 2013-08-12 (×5): qty 1

## 2013-08-12 MED ORDER — HYDROMORPHONE HCL PF 1 MG/ML IJ SOLN
INTRAMUSCULAR | Status: AC
  Filled 2013-08-12: qty 1

## 2013-08-12 MED ORDER — LAMOTRIGINE 150 MG PO TABS
150.0000 mg | ORAL_TABLET | Freq: Every morning | ORAL | Status: DC
  Filled 2013-08-12: qty 1

## 2013-08-12 SURGICAL SUPPLY — 42 items
ATTRACTOMAT 16X20 MAGNETIC DRP (DRAPES) ×3 IMPLANT
BENZOIN TINCTURE PRP APPL 2/3 (GAUZE/BANDAGES/DRESSINGS) ×3 IMPLANT
BLADE HEX COATED 2.75 (ELECTRODE) ×3 IMPLANT
BLADE SURG 15 STRL LF DISP TIS (BLADE) ×2 IMPLANT
BLADE SURG 15 STRL SS (BLADE) ×4
CANISTER SUCTION 2500CC (MISCELLANEOUS) ×3 IMPLANT
CLIP TI MEDIUM 6 (CLIP) ×9 IMPLANT
CLIP TI WIDE RED SMALL 6 (CLIP) ×9 IMPLANT
CLOSURE WOUND 1/2 X4 (GAUZE/BANDAGES/DRESSINGS) ×1
DISSECTOR ROUND CHERRY 3/8 STR (MISCELLANEOUS) ×3 IMPLANT
DRAIN PENROSE 18X1/4 LTX STRL (WOUND CARE) IMPLANT
DRAPE PED LAPAROTOMY (DRAPES) ×3 IMPLANT
ELECT REM PT RETURN 9FT ADLT (ELECTROSURGICAL) ×3
ELECTRODE REM PT RTRN 9FT ADLT (ELECTROSURGICAL) ×1 IMPLANT
GAUZE SPONGE 4X4 12PLY STRL (GAUZE/BANDAGES/DRESSINGS) ×3 IMPLANT
GAUZE SPONGE 4X4 16PLY XRAY LF (GAUZE/BANDAGES/DRESSINGS) ×6 IMPLANT
GLOVE BIOGEL PI IND STRL 7.0 (GLOVE) ×2 IMPLANT
GLOVE BIOGEL PI INDICATOR 7.0 (GLOVE) ×4
GLOVE ECLIPSE 8.0 STRL XLNG CF (GLOVE) ×3 IMPLANT
GLOVE INDICATOR 8.0 STRL GRN (GLOVE) ×6 IMPLANT
GOWN STRL REUS W/TWL LRG LVL3 (GOWN DISPOSABLE) ×3 IMPLANT
GOWN STRL REUS W/TWL XL LVL3 (GOWN DISPOSABLE) ×6 IMPLANT
HEMOSTAT SURGICEL 2X14 (HEMOSTASIS) IMPLANT
HEMOSTAT SURGICEL 2X4 FIBR (HEMOSTASIS) ×3 IMPLANT
KIT BASIN OR (CUSTOM PROCEDURE TRAY) ×3 IMPLANT
NS IRRIG 1000ML POUR BTL (IV SOLUTION) ×3 IMPLANT
PACK BASIC VI WITH GOWN DISP (CUSTOM PROCEDURE TRAY) ×3 IMPLANT
PENCIL BUTTON HOLSTER BLD 10FT (ELECTRODE) ×3 IMPLANT
SHEARS HARMONIC 9CM CVD (BLADE) IMPLANT
STAPLER VISISTAT 35W (STAPLE) ×3 IMPLANT
STRIP CLOSURE SKIN 1/2X4 (GAUZE/BANDAGES/DRESSINGS) ×2 IMPLANT
SUT MNCRL AB 4-0 PS2 18 (SUTURE) ×3 IMPLANT
SUT SILK 2 0 (SUTURE) ×2
SUT SILK 2-0 18XBRD TIE 12 (SUTURE) ×1 IMPLANT
SUT SILK 3 0 (SUTURE) ×2
SUT SILK 3-0 18XBRD TIE 12 (SUTURE) ×1 IMPLANT
SUT VIC AB 3-0 SH 18 (SUTURE) ×6 IMPLANT
SUT VICRYL 3 0 BR 18  UND (SUTURE)
SUT VICRYL 3 0 BR 18 UND (SUTURE) IMPLANT
SYR BULB IRRIGATION 50ML (SYRINGE) ×3 IMPLANT
TOWEL OR 17X26 10 PK STRL BLUE (TOWEL DISPOSABLE) ×3 IMPLANT
YANKAUER SUCT BULB TIP 10FT TU (MISCELLANEOUS) ×3 IMPLANT

## 2013-08-12 NOTE — H&P (Addendum)
Tamara Mccullough is an 58 y.o. female.   Chief Complaint:   Here for elective partial thyroidectomy HPI:   She has a multinodular goiter with the largest nodule being on the right side.  She has some mild compression type symptoms.  FNA of the large right nodule in 2013 demonstrated benign findings.  This nodule has enlarged.  There are also multiple left sided nodules the largest of which demonstrated benign findings on a recent FNA.  She also needs to undergo and redo cervical spine procedure via an anterior approach and the right sided goiter area is in the way of that approach.    Past Medical History  Diagnosis Date  . Anemia   . Depression   . Depression   . Morbid obesity   . Edema   . Hypertension   . Chronic back pain   . Peripheral neuropathy   . Hyperlipidemia   . Panic attacks   . Insomnia   . Restless leg syndrome   . PONV (postoperative nausea and vomiting)   . Sleep apnea     has not used CPAP in 3 years   . Asthma     slight  . Seizures     hx of seizure 05/2013   . Arthritis     back     Past Surgical History  Procedure Laterality Date  . 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 Tubal ligation    . Back surgery      Family History  Problem Relation Age of Onset  . Congestive Heart Failure Mother   . Diabetes Mother   . Cancer Mother   . Congestive Heart Failure Father   . Emphysema Father   . Alcohol abuse Father   . Alcohol abuse Sister   . Alcohol abuse Brother   . Alcohol abuse Paternal Grandfather   . Alcohol abuse Brother   . Alcohol abuse Brother    Social History:  reports that she quit smoking about 17 years ago. She has never used smokeless tobacco. She reports that she drinks about 7.2 ounces of alcohol per week. 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  Allergies:  Allergies  Allergen Reactions  . 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  .  Gabapentin Other (See Comments)    Blisters in mouth   . Trazodone And Nefazodone Hypertension  . Codeine Itching, Nausea And Vomiting and Rash  . Eszopiclone Rash  . Lansoprazole Palpitations  . Motrin [Ibuprofen] Nausea And Vomiting  . Prilosec [Omeprazole] Palpitations and Other (See Comments)    Makes heart beat fast.    Medications Prior to Admission  Medication Sig Dispense Refill  . docusate sodium (COLACE) 50 MG capsule Take 150 mg by mouth daily.      Marland Kitchen FLUoxetine (PROZAC) 20 MG tablet Take 60 mg by mouth every morning.       . hydrOXYzine (ATARAX/VISTARIL) 10 MG tablet Take 10 mg by mouth 3 (three) times daily.      Marland Kitchen lamoTRIgine (LAMICTAL) 150 MG tablet Take 150 mg by mouth every morning.      Marland Kitchen lisinopril-hydrochlorothiazide (PRINZIDE,ZESTORETIC) 10-12.5 MG per tablet Take 1 tablet by mouth every morning.      . Multiple Vitamin (MULTIVITAMIN WITH MINERALS) TABS tablet Take 1 tablet by mouth daily.      . rosuvastatin (CRESTOR) 20 MG tablet Take 0.5 tablets (10 mg total) by mouth  daily.  30 tablet  2  . temazepam (RESTORIL) 30 MG capsule Take 30 mg by mouth at bedtime.      . traMADol (ULTRAM) 50 MG tablet Take 100 mg by mouth 3 (three) times daily as needed (Pain).       . vitamin B-12 (CYANOCOBALAMIN) 1000 MCG tablet Take 2,000 mcg by mouth every morning.      . pramipexole (MIRAPEX) 0.5 MG tablet Take 1 tablet (0.5 mg total) by mouth at bedtime.  90 tablet  1    No results found for this or any previous visit (from the past 48 hour(s)). No results found.  Review of Systems  Constitutional: Negative.   Respiratory: Negative.   Cardiovascular: Negative.   Gastrointestinal: Negative.     Blood pressure 123/64, pulse 58, temperature 97.7 F (36.5 C), temperature source Oral, resp. rate 18, height 5\' 4"  (1.626 m), weight 239 lb (108.41 kg), SpO2 99.00%. Physical Exam  Constitutional: No distress.  Overweight   HENT:  Head: Normocephalic and atraumatic.  Eyes: No  scleral icterus.  Neck: Thyromegaly (Right side) present.  Cardiovascular: Normal rate and regular rhythm.   Respiratory: Effort normal and breath sounds normal.  GI: Soft. There is no tenderness.  Lymphadenopathy:    She has no cervical adenopathy.  Neurological: She is alert.  Skin: Skin is warm and dry.  Psychiatric: She has a normal mood and affect. Her behavior is normal.     Assessment/Plan Thyroid goiter with enlarging right sided nodule which is also obstructing an anterior approach for redo cervical spine surgery.  Plan:  Right thyroid lobectomy.  Procedure and risks have been discussed with her.  Tamara Mccullough J 08/12/2013, 10:44 AM

## 2013-08-12 NOTE — Transfer of Care (Signed)
Immediate Anesthesia Transfer of Care Note  Patient: Tamara Mccullough  Procedure(s) Performed: Procedure(s): RIGHT THYROID LOBECTOMY (Right)  Patient Location: PACU  Anesthesia Type:General  Level of Consciousness: awake, alert  and oriented  Airway & Oxygen Therapy: Patient Spontanous Breathing and Patient connected to face mask oxygen  Post-op Assessment: Report given to PACU RN and Post -op Vital signs reviewed and stable  Post vital signs: Reviewed and stable  Complications: No apparent anesthesia complications

## 2013-08-12 NOTE — Anesthesia Postprocedure Evaluation (Signed)
  Anesthesia Post-op Note  Patient: Tamara Mccullough  Procedure(s) Performed: Procedure(s) (LRB): RIGHT THYROID LOBECTOMY (Right)  Patient Location: PACU  Anesthesia Type: General  Level of Consciousness: awake and alert   Airway and Oxygen Therapy: Patient Spontanous Breathing  Post-op Pain: mild  Post-op Assessment: Post-op Vital signs reviewed, Patient's Cardiovascular Status Stable, Respiratory Function Stable, Patent Airway and No signs of Nausea or vomiting  Last Vitals:  Filed Vitals:   08/12/13 1330  BP: 145/72  Pulse: 68  Temp:   Resp: 12    Post-op Vital Signs: stable   Complications: No apparent anesthesia complications

## 2013-08-12 NOTE — Op Note (Signed)
Operative Note  Tamara Mccullough female 58 y.o. 08/12/2013  PREOPERATIVE DX:  Thyroid goiter at the enlarging right-sided nodule  POSTOPERATIVE DX:  Same  PROCEDURE:  Right thyroid lobectomy         Surgeon: Odis Hollingshead   Assistants: Armandina Gemma M.D.  Anesthesia: General endotracheal anesthesia  Indications: this is a 58 year old female with a nodular thyroid artery. She has a right-sided goiter that has been enlarging. It was biopsied 2013 is benign. She also has a smaller left-sided nodule that was recently biopsied and is benign. Also, she has cervical spine disease and needs an anterior approach for redo cervical spine operation. The right-sided goiter area is in the way. She now presents for right thyroidectomy lobectomy    Procedure Detail:  She was seen in the holding area in the right lower neck mark my initials. She was brought to the operating room placed supine on the operating table and general anesthetic was given. Her head was placed in slight extension. The neck and upper chest areas were sterilely prepped and draped.  A transverse lower neck incision was made through the skin, subcutaneous tissues, and platysma muscle. Subplatysmal flaps were raised superiorly to the thyroid cartilage and inferiorly to the suprasternal notch. The fascia between the strap muscles was divided. The strap muscles were dissected free of the right lobe of the thyroid gland. Beginning at the superior pole and using dissection on the thyroid capsule, the superior pole vessels were identified, isolated, clipped, and divided with the harmonic scalpel. This allowed for mobilization of the superior pole. Next, the inferior pole was approached. Staying on the thyroid capsule, vessels of the inferior pole of the thyroid gland were identified. They were isolated, clipped, and divided with the harmonic scalpel.  The middle portion of the thyroid gland was then approached. The thyroid gland was rolled  up into the wound. He was carefully mobilized. There was a tubercle extending down toward the esophagus. Using blunt dissection this was carefully brought up toward the thyroid gland. The underlying recurrent laryngeal nerve and the parathyroid glands were identified. These were both preserved. Once this tubercle and the medial aspect of the thyroid gland was mobilized, it was then dissected free from the trachea. The isthmus was then divided with a Harmonic Scalpel. The superior pole of the right lobe of the thyroid gland was marked with a suture. The speciman was passed off the field.  There was some bleeding from a small vessel which is controlled with small clips. The parathyroid gland and the recurrent laryngeal nerve were again identified and were intact.  The wound was then irrigated and there is no evidence of bleeding. Surgicel was then placed in the wound. The strap muscles were reapproximated with interrupted 3-0 Vicryl sutures. The platysma muscle was approximated with interrupted 3-0 Vicryl sutures. The skin was closed with a running 4 Monocryl subcuticular stitch. Steri-Strips and a sterile dressing were applied.  She tolerated the procedure well without any apparent complications and was taken to recovery in satisfactory condition.   Estimated Blood Loss:  150 ml         Drains: none  Blood Given: none          Specimens: right lobe of thyroid gland and part of isthmus.        Complications:  * No complications entered in OR log *         Disposition: PACU - hemodynamically stable.  Condition: stable

## 2013-08-12 NOTE — Discharge Instructions (Addendum)
[facility], [place] [phone]  THYROID/ PARATHYROID SURGERY: POST OP INSTRUCTIONS  Always review your discharge instruction sheet given to you by the facility where your surgery was performed.  IF YOU HAVE DISABILITY OR FAMILY LEAVE FORMS, YOU MUST BRING THEM TO THE OFFICE FOR PROCESSING.  PLEASE DO NOT GIVE THEM TO YOUR DOCTOR.  1. A prescription for pain medication may be given to you upon discharge.  Take your pain medication as prescribed, if needed.  If narcotic pain medicine is not needed, then you may take acetaminophen (Tylenol) or ibuprofen (Advil) as needed. 2. Take your usually prescribed medications unless otherwise directed. 3. If you need a refill on your pain medication, please contact your pharmacy. They will contact our office to request authorization.  Prescriptions will not be filled after 5pm or on week-ends. 4. You should follow a light diet the first 24 hours after arrival home, such as soup and crackers, etc.  Be sure to include lots of fluids daily.  Resume your normal diet the day after surgery. 5. Most patients will experience some swelling and bruising on the chest and neck area.  Ice packs will help.  Swelling and bruising can take several days to resolve.  6. It is common to experience some constipation if taking pain medication after surgery.  Increasing fluid intake and taking a stool softener will usually help or prevent this problem from occurring.  A mild laxative (Milk of Magnesia or Miralax) should be taken according to package directions if there are no bowel movements after 48 hours. 7. Unless discharge instructions indicate otherwise, you may remove your bandages 48 hours after surgery, and you may shower at that time.  You may have steri-strips (small skin tapes) in place directly over the incision.  These strips should be left on the skin for.  If your surgeon used skin glue on the incision, you may shower in 24 hours.  The glue will flake  off over the next 2-3 weeks.  Any sutures or staples will be removed at the office during your follow-up visit. 8. ACTIVITIES:  You may resume regular (light) daily activities beginning the next day--such as daily self-care, walking, climbing stairs--gradually increasing activities as tolerated.  You may have sexual intercourse when it is comfortable.  Refrain from any heavy lifting or straining for one week. a. You may drive when you no longer are taking prescription pain medication, you can comfortably wear a seatbelt, and you can safely maneuver your car and apply brakes b. RETURN TO WORK:  __________________________________________________________ 9. You should see your doctor in the office for a follow-up appointment approximately two weeks after your surgery.  Make sure that you call for this appointment within a day or two after you arrive home to insure a convenient appointment time. 10. OTHER INSTRUCTIONS: ____________________________________________________________________________ _________________________________________________________________________________________________________________ _________________________________________________________________________________________________________________   WHEN TO CALL YOUR DOCTOR: 1. Fever over 101.0 2. Inability to urinate 3. Nausea and/or vomiting 4. Extreme swelling or bruising 5. Continued bleeding from incision. 6. Increased pain, redness, or drainage from the incision. 7. Difficulty swallowing or breathing 8. Muscle cramping or spasms. 9. Numbness or tingling in hands or feet or around lips.  The clinic staff is available to answer your questions during regular business hours.  Please dont hesitate to call and ask to speak to one of the nurses if you have concerns.  For further questions, please visit [email or web address]

## 2013-08-12 NOTE — Anesthesia Preprocedure Evaluation (Addendum)
Anesthesia Evaluation  Patient identified by MRN, date of birth, ID band Patient awake    Reviewed: Allergy & Precautions, H&P , NPO status , Patient's Chart, lab work & pertinent test results  Airway Mallampati: III TM Distance: <3 FB Neck ROM: Full    Dental no notable dental hx.    Pulmonary sleep apnea , former smoker,  breath sounds clear to auscultation  Pulmonary exam normal       Cardiovascular hypertension, Rhythm:Regular Rate:Normal     Neuro/Psych Seizures -,  negative psych ROS   GI/Hepatic negative GI ROS, (+)     substance abuse  alcohol use and cocaine use,   Endo/Other  Morbid obesity  Renal/GU negative Renal ROS  negative genitourinary   Musculoskeletal negative musculoskeletal ROS (+)   Abdominal   Peds negative pediatric ROS (+)  Hematology negative hematology ROS (+)   Anesthesia Other Findings   Reproductive/Obstetrics negative OB ROS                         Anesthesia Physical Anesthesia Plan  ASA: III  Anesthesia Plan: General   Post-op Pain Management:    Induction: Intravenous  Airway Management Planned: Oral ETT  Additional Equipment:   Intra-op Plan:   Post-operative Plan: Extubation in OR  Informed Consent: I have reviewed the patients History and Physical, chart, labs and discussed the procedure including the risks, benefits and alternatives for the proposed anesthesia with the patient or authorized representative who has indicated his/her understanding and acceptance.   Dental advisory given  Plan Discussed with: CRNA and Surgeon  Anesthesia Plan Comments:         Anesthesia Quick Evaluation

## 2013-08-13 ENCOUNTER — Telehealth (INDEPENDENT_AMBULATORY_CARE_PROVIDER_SITE_OTHER): Payer: Self-pay | Admitting: General Surgery

## 2013-08-13 ENCOUNTER — Encounter (HOSPITAL_COMMUNITY): Payer: Self-pay | Admitting: General Surgery

## 2013-08-13 MED ORDER — HYDROMORPHONE HCL 2 MG PO TABS: 2.0000 mg | ORAL_TABLET | ORAL | Status: DC | PRN

## 2013-08-13 MED ORDER — DIPHENHYDRAMINE HCL 25 MG PO CAPS
25.0000 mg | ORAL_CAPSULE | ORAL | Status: DC | PRN
  Administered 2013-08-13 (×2): 25 mg via ORAL
  Filled 2013-08-13 (×2): qty 1

## 2013-08-13 MED ORDER — HYDROMORPHONE HCL 2 MG PO TABS
2.0000 mg | ORAL_TABLET | Freq: Once | ORAL | Status: AC
  Administered 2013-08-13: 2 mg via ORAL
  Filled 2013-08-13: qty 1

## 2013-08-13 NOTE — Progress Notes (Signed)
Patient itching and scratching and states she thinks  it's the oxy ir that's making her itch and states I don't want that oxy any more,but does want to continue th iv dilaudid.Brien Mates, MD notified and orders received.

## 2013-08-13 NOTE — Progress Notes (Signed)
Notified MD Rosenbower that patient would like something for pain prior to be discharged, order given for dilaudid 2 mg once, order entered Neta Mends RN 08-13-2013 8:41am

## 2013-08-13 NOTE — Progress Notes (Addendum)
1 Day Post-Op  Subjective: Doing well.  Swallowing well.  Ready to go home.  Oxy IR caused itching.  Objective: Vital signs in last 24 hours: Temp:  [97.4 F (36.3 C)-98.5 F (36.9 C)] 97.9 F (36.6 C) (07/10 0556) Pulse Rate:  [58-82] 62 (07/10 0556) Resp:  [12-18] 18 (07/10 0556) BP: (109-153)/(50-80) 109/72 mmHg (07/10 0556) SpO2:  [95 %-100 %] 96 % (07/10 0500) Weight:  [239 lb (108.41 kg)] 239 lb (108.41 kg) (07/09 0944) Last BM Date: 08/12/13  Intake/Output from previous day: 07/09 0701 - 07/10 0700 In: 4867.5 [P.O.:2400; I.V.:2467.5] Out: 1655 [Urine:1650; Blood:5] Intake/Output this shift:    PE: General- In NAD Neck-dressing dry, no significant swelling.  Voice strong  Lab Results:  No results found for this basename: WBC, HGB, HCT, PLT,  in the last 72 hours BMET No results found for this basename: NA, K, CL, CO2, GLUCOSE, BUN, CREATININE, CALCIUM,  in the last 72 hours PT/INR No results found for this basename: LABPROT, INR,  in the last 72 hours Comprehensive Metabolic Panel:    Component Value Date/Time   NA 138 08/09/2013 1243   NA 141 07/28/2013 1120   NA 141 03/10/2013 0912   NA 139 03/04/2013 1552   K 4.3 08/09/2013 1243   K 4.5 07/28/2013 1120   CL 100 08/09/2013 1243   CL 102 07/28/2013 1120   CO2 22 08/09/2013 1243   CO2 29 07/28/2013 1120   BUN 17 08/09/2013 1243   BUN 17 07/28/2013 1120   BUN 18 03/10/2013 0912   BUN 20 03/04/2013 1552   CREATININE 0.77 08/09/2013 1243   CREATININE 0.88 07/28/2013 1120   CREATININE 0.84 05/20/2012 1113   GLUCOSE 100* 08/09/2013 1243   GLUCOSE 88 07/28/2013 1120   GLUCOSE 108* 03/10/2013 0912   GLUCOSE 148* 03/04/2013 1552   CALCIUM 9.1 08/09/2013 1243   CALCIUM 8.7 07/28/2013 1120   AST 23 08/09/2013 1243   AST 31 07/28/2013 1120   ALT 19 08/09/2013 1243   ALT 27 07/28/2013 1120   ALKPHOS 92 08/09/2013 1243   ALKPHOS 98 07/28/2013 1120   BILITOT 0.4 08/09/2013 1243   BILITOT 0.3 07/28/2013 1120   PROT 6.4 08/09/2013 1243   PROT 6.5  07/28/2013 1120   PROT 6.4 03/10/2013 0912   PROT 6.8 03/04/2013 1552   ALBUMIN 3.7 07/28/2013 1120   ALBUMIN 3.8 03/04/2013 1552     Studies/Results: No results found.  Anti-infectives: Anti-infectives   Start     Dose/Rate Route Frequency Ordered Stop   08/12/13 0914  ceFAZolin (ANCEF) IVPB 2 g/50 mL premix     2 g 100 mL/hr over 30 Minutes Intravenous On call to O.R. 08/12/13 0914 08/12/13 1139      Assessment Principal Problem:   Thyroid goiter s/p right thyroid lobectomy-doing well    LOS: 1 day   Plan: Discharge today.  Instructions given.   Tamara Mccullough J 08/13/2013

## 2013-08-13 NOTE — Telephone Encounter (Signed)
Patient called in to let us know that she just got a new Rx from her psychiatrist.  She wanted to make sure that the new medication was not going to interfere with her dilaudid due to her just being d/c from the hospital from her surgery.  Informed her that her new medication, buspirone, does not interact with the dilaudid but it could make some of the side effects enhanced; including:  Dizziness, drowsiness, etc.  Patient verbalized understanding.

## 2013-08-23 ENCOUNTER — Encounter (INDEPENDENT_AMBULATORY_CARE_PROVIDER_SITE_OTHER): Payer: Self-pay | Admitting: General Surgery

## 2013-08-23 ENCOUNTER — Ambulatory Visit (INDEPENDENT_AMBULATORY_CARE_PROVIDER_SITE_OTHER): Payer: Medicare Other | Admitting: General Surgery

## 2013-08-23 VITALS — BP 114/68 | HR 72 | Temp 97.8°F | Resp 16 | Ht 67.0 in | Wt 236.4 lb

## 2013-08-23 DIAGNOSIS — Z4889 Encounter for other specified surgical aftercare: Secondary | ICD-10-CM

## 2013-08-23 NOTE — Progress Notes (Signed)
Procedure:  Right thyroid lobectomy  Date:  08/12/2013  Pathology:  Benign hyperplastic nodules  History:  She is here for her first postoperative visit. She is having minimal pain. Swallowing is okay.  Exam: General- Is in NAD. Neck-incision is clean and intact. There is minimal swelling.  Assessment:  Doing well following right thyroid lobectomy. Still has a nodule on the left side that will need to be followed over time and we discussed this.  Plan:  May drive. Activities as tolerated. Continue soft diet. May use Mederma on incision. Return visit 4 weeks.

## 2013-08-23 NOTE — Patient Instructions (Signed)
May drive.  May use Mederma on incision.  Keep incision out of direct sunlight for 3 months.  Activities as tolerated.  Continue soft diet.

## 2013-08-24 DIAGNOSIS — F39 Unspecified mood [affective] disorder: Secondary | ICD-10-CM | POA: Diagnosis not present

## 2013-09-16 ENCOUNTER — Encounter (INDEPENDENT_AMBULATORY_CARE_PROVIDER_SITE_OTHER): Payer: Medicare Other | Admitting: General Surgery

## 2013-10-04 ENCOUNTER — Other Ambulatory Visit: Payer: Self-pay | Admitting: *Deleted

## 2013-10-04 MED ORDER — ROSUVASTATIN CALCIUM 10 MG PO TABS: 10.0000 mg | ORAL_TABLET | Freq: Every day | ORAL | Status: DC

## 2013-10-04 NOTE — Telephone Encounter (Signed)
Last lipids 7/15. Epic had Crestor 20mg  take .5 daily and fax from [facility] was for 10mg  qd. I called [facility] and Crestor 10mg  was called in on 07/07/13.

## 2013-10-14 DIAGNOSIS — M4802 Spinal stenosis, cervical region: Secondary | ICD-10-CM | POA: Diagnosis not present

## 2013-10-14 DIAGNOSIS — Z6838 Body mass index (BMI) 38.0-38.9, adult: Secondary | ICD-10-CM | POA: Diagnosis not present

## 2013-10-20 DIAGNOSIS — Z9889 Other specified postprocedural states: Secondary | ICD-10-CM | POA: Diagnosis not present

## 2013-10-23 DIAGNOSIS — M542 Cervicalgia: Secondary | ICD-10-CM | POA: Diagnosis not present

## 2013-10-23 DIAGNOSIS — M4802 Spinal stenosis, cervical region: Secondary | ICD-10-CM | POA: Diagnosis not present

## 2013-10-26 ENCOUNTER — Other Ambulatory Visit: Payer: Self-pay | Admitting: Neurosurgery

## 2013-10-26 DIAGNOSIS — M4802 Spinal stenosis, cervical region: Secondary | ICD-10-CM | POA: Diagnosis not present

## 2013-10-26 DIAGNOSIS — Z6839 Body mass index (BMI) 39.0-39.9, adult: Secondary | ICD-10-CM | POA: Diagnosis not present

## 2013-10-27 ENCOUNTER — Telehealth: Payer: Self-pay | Admitting: Nurse Practitioner

## 2013-10-27 NOTE — Telephone Encounter (Signed)
appt rescheduled.

## 2013-10-29 ENCOUNTER — Encounter (HOSPITAL_COMMUNITY): Payer: Self-pay

## 2013-10-30 NOTE — Pre-Procedure Instructions (Signed)
AHLIYA GLATT  10/30/2013   Your procedure is scheduled on:  Tues, Oct 6 @ 1:30 PM  Report to [facility]  at 10:30 AM.  Call this number if you have problems the morning of surgery: [phone]   Remember:   Do not eat food or drink liquids after midnight.   Take these medicines the morning of surgery with A SIP OF WATER: Buspar(Buspirone),Prozac(Fluoxetine),Lamictal(Lamotrigine),and Tramadol(Ultram-if needed)              No Goody's,BC's,Aleve,Aspirin,Ibuprofen,Fish Oil,or any Herbal Medications   Do not wear jewelry, make-up or nail polish.  Do not wear lotions, powders, or perfumes. You may wear deodorant.  Do not shave 48 hours prior to surgery.   Do not bring valuables to the hospital.  [facility] is not responsible                  for any belongings or valuables.               Contacts, dentures or bridgework may not be worn into surgery.  Leave suitcase in the car. After surgery it may be brought to your room.  For patients admitted to the hospital, discharge time is determined by your                treatment team.                 Special Instructions:  [facility] - Preparing for Surgery  Before surgery, you can play an important role.  Because skin is not sterile, your skin needs to be as free of germs as possible.  You can reduce the number of germs on you skin by washing with CHG (chlorahexidine gluconate) soap before surgery.  CHG is an antiseptic cleaner which kills germs and bonds with the skin to continue killing germs even after washing.  Please DO NOT use if you have an allergy to CHG or antibacterial soaps.  If your skin becomes reddened/irritated stop using the CHG and inform your nurse when you arrive at Short Stay.  Do not shave (including legs and underarms) for at least 48 hours prior to the first CHG shower.  You may shave your face.  Please follow these instructions carefully:   1.  Shower with CHG Soap the night before surgery and the                                 morning of Surgery.  2.  If you choose to wash your hair, wash your hair first as usual with your       normal shampoo.  3.  After you shampoo, rinse your hair and body thoroughly to remove the                      Shampoo.  4.  Use CHG as you would any other liquid soap.  You can apply chg directly       to the skin and wash gently with scrungie or a clean washcloth.  5.  Apply the CHG Soap to your body ONLY FROM THE NECK DOWN.        Do not use on open wounds or open sores.  Avoid contact with your eyes,       ears, mouth and genitals (private parts).  Wash genitals (private parts)       with your normal soap.  6.  Wash thoroughly, paying special attention to the area where your surgery        will be performed.  7.  Thoroughly rinse your body with warm water from the neck down.  8.  DO NOT shower/wash with your normal soap after using and rinsing off       the CHG Soap.  9.  Pat yourself dry with a clean towel.            10.  Wear clean pajamas.            11.  Place clean sheets on your bed the night of your first shower and do not        sleep with pets.  Day of Surgery  Do not apply any lotions/deoderants the morning of surgery.  Please wear clean clothes to the hospital/surgery center.     Please read over the following fact sheets that you were given: Pain Booklet, Coughing and Deep Breathing, MRSA Information and Surgical Site Infection Prevention

## 2013-11-01 ENCOUNTER — Encounter (HOSPITAL_COMMUNITY)
Admission: RE | Admit: 2013-11-01 | Discharge: 2013-11-01 | Disposition: A | Discharge status: Home / Self Care | Payer: Medicare Other | Source: Ambulatory Visit | Attending: Neurosurgery | Admitting: Neurosurgery

## 2013-11-01 ENCOUNTER — Encounter (HOSPITAL_COMMUNITY): Payer: Self-pay

## 2013-11-01 DIAGNOSIS — M4802 Spinal stenosis, cervical region: Secondary | ICD-10-CM | POA: Insufficient documentation

## 2013-11-01 DIAGNOSIS — Z01812 Encounter for preprocedural laboratory examination: Secondary | ICD-10-CM | POA: Diagnosis not present

## 2013-11-01 LAB — CBC
HEMATOCRIT: 37.5 % (ref 36.0–46.0)
Hemoglobin: 12 g/dL (ref 12.0–15.0)
MCH: 25.8 pg — ABNORMAL LOW (ref 26.0–34.0)
MCHC: 32 g/dL (ref 30.0–36.0)
MCV: 80.6 fL (ref 78.0–100.0)
Platelets: 232 10*3/uL (ref 150–400)
RBC: 4.65 MIL/uL (ref 3.87–5.11)
RDW: 16.3 % — ABNORMAL HIGH (ref 11.5–15.5)
WBC: 7 10*3/uL (ref 4.0–10.5)

## 2013-11-01 LAB — SURGICAL PCR SCREEN
MRSA, PCR: NEGATIVE
Staphylococcus aureus: NEGATIVE

## 2013-11-01 LAB — BASIC METABOLIC PANEL
Anion gap: 13 (ref 5–15)
BUN: 15 mg/dL (ref 6–23)
CALCIUM: 8.4 mg/dL (ref 8.4–10.5)
CO2: 24 mEq/L (ref 19–32)
Chloride: 99 mEq/L (ref 96–112)
Creatinine, Ser: 0.71 mg/dL (ref 0.50–1.10)
GFR calc Af Amer: >90 mL/min (ref >90)
GFR calc non Af Amer: >90 mL/min (ref >90)
GLUCOSE: 90 mg/dL (ref 70–99)
POTASSIUM: 3.9 meq/L (ref 3.7–5.3)
Sodium: 136 mEq/L — ABNORMAL LOW (ref 137–147)

## 2013-11-01 NOTE — Progress Notes (Addendum)
DENIES ANY CARDIAC ISSUES.  Did have some heart tests done prior to her gastric bypass, which were normal.  DA

## 2013-11-02 ENCOUNTER — Other Ambulatory Visit (HOSPITAL_COMMUNITY): Payer: Self-pay

## 2013-11-05 ENCOUNTER — Other Ambulatory Visit: Payer: Self-pay | Admitting: Nurse Practitioner

## 2013-11-08 MED ORDER — CEFAZOLIN SODIUM-DEXTROSE 2-3 GM-% IV SOLR
2.0000 g | INTRAVENOUS | Status: AC
  Administered 2013-11-09: 2 g via INTRAVENOUS
  Filled 2013-11-08: qty 50

## 2013-11-08 NOTE — H&P (Signed)
Tamara Mccullough is an 58 y.o. female.   Chief Complaint: neck and arm pain HPI: patien who has been complaining of neck and arm pain associated with sensory changes. It was found she had a large thyroid which was taken care surgically. The arm pain and weakness is getting worse.   Past Medical History  Diagnosis Date  . Anemia   . Depression   . Depression   . Morbid obesity   . Edema   . Hypertension   . Chronic back pain   . Peripheral neuropathy   . Hyperlipidemia   . Panic attacks   . Insomnia   . Restless leg syndrome   . PONV (postoperative nausea and vomiting)   . Sleep apnea     has not used CPAP in 3 years   . Asthma     slight  . Arthritis     back   . Seizures     hx of seizure 05/2013     Past Surgical History  Procedure Laterality Date  . 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 Tubal ligation    . Back surgery    . Thyroid lobectomy Right 08/12/2013    Procedure: RIGHT THYROID LOBECTOMY;  Surgeon: Odis Hollingshead, MD;  Location: [facility];  Service: General;  Laterality: Right;  . Minor hemorrhoidectomy    . Ctr      Family History  Problem Relation Age of Onset  . Congestive Heart Failure Mother   . Diabetes Mother   . Cancer Mother     cervical  . Congestive Heart Failure Father   . Emphysema Father   . Alcohol abuse Father   . Alcohol abuse Sister   . Alcohol abuse Brother   . Alcohol abuse Paternal Grandfather   . Alcohol abuse Brother   . Alcohol abuse Brother    Social History:  reports that she quit smoking about 17 years ago. She has never used smokeless tobacco. She reports that she drinks about 7.2 ounces of alcohol per week. She reports that she uses illicit drugs (Benzodiazepines).  Allergies:  Allergies  Allergen Reactions  . 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  . Gabapentin Other (See Comments)    Blisters in mouth   . Trazodone And Nefazodone Hypertension  . Codeine Itching,  Nausea And Vomiting and Rash  . Eszopiclone Rash  . Lansoprazole Palpitations  . Motrin [Ibuprofen] Nausea And Vomiting  . Prilosec [Omeprazole] Palpitations and Other (See Comments)    Makes heart beat fast.    No prescriptions prior to admission    No results found for this or any previous visit (from the past 48 hour(s)). No results found.  Review of Systems  Constitutional: Negative.   Eyes: Negative.   Respiratory: Negative.   Cardiovascular: Negative.   Genitourinary: Negative.   Musculoskeletal: Positive for neck pain.  Skin: Negative.   Neurological: Positive for tingling, sensory change and focal weakness.  Endo/Heme/Allergies: Negative.   Psychiatric/Behavioral: Negative.     There were no vitals taken for this visit. Physical Exam hent, nl. Neck,anterior scar for thyroid removal. The mass was on the left. Cv, nl. Lungs, clear. Abdomen, soft. Extremities, scar in wrist for CTS. Neuro weakness of deltoids and biceps. dtr one plus. Cervical spine mri shows stenosis at 4-5,5-6,6-7.  Assessment/Plan Patient was seen by ENT  Whom did not find vocal chord palsy. Patient to go ahead with decompression and  fusion from c4 to c7. She is aware of risks and benefits including the possibility of vocal cord damage. i witl try to go from the left side  Tamara Mccullough M 11/08/2013, 5:05 PM

## 2013-11-09 ENCOUNTER — Encounter (HOSPITAL_COMMUNITY): Payer: Medicare Other | Admitting: Certified Registered Nurse Anesthetist

## 2013-11-09 ENCOUNTER — Inpatient Hospital Stay (HOSPITAL_COMMUNITY): Payer: Medicare Other

## 2013-11-09 ENCOUNTER — Inpatient Hospital Stay (HOSPITAL_COMMUNITY): Payer: Medicare Other | Admitting: Certified Registered Nurse Anesthetist

## 2013-11-09 ENCOUNTER — Encounter (HOSPITAL_COMMUNITY): Payer: Self-pay | Admitting: Certified Registered Nurse Anesthetist

## 2013-11-09 ENCOUNTER — Encounter (HOSPITAL_COMMUNITY)
Admission: RE | Disposition: A | Discharge status: Home / Self Care | Payer: Self-pay | Source: Ambulatory Visit | Attending: Neurosurgery

## 2013-11-09 ENCOUNTER — Inpatient Hospital Stay (HOSPITAL_COMMUNITY)
Admission: RE | Admit: 2013-11-09 | Discharge: 2013-11-11 | DRG: 472 | Disposition: A | Discharge status: Home / Self Care | Payer: Medicare Other | Source: Ambulatory Visit | Attending: Neurosurgery | Admitting: Neurosurgery

## 2013-11-09 DIAGNOSIS — F329 Major depressive disorder, single episode, unspecified: Secondary | ICD-10-CM | POA: Diagnosis present

## 2013-11-09 DIAGNOSIS — E785 Hyperlipidemia, unspecified: Secondary | ICD-10-CM | POA: Diagnosis present

## 2013-11-09 DIAGNOSIS — I10 Essential (primary) hypertension: Secondary | ICD-10-CM | POA: Diagnosis present

## 2013-11-09 DIAGNOSIS — G2581 Restless legs syndrome: Secondary | ICD-10-CM | POA: Diagnosis present

## 2013-11-09 DIAGNOSIS — M549 Dorsalgia, unspecified: Secondary | ICD-10-CM | POA: Diagnosis not present

## 2013-11-09 DIAGNOSIS — Z6841 Body Mass Index (BMI) 40.0 and over, adult: Secondary | ICD-10-CM

## 2013-11-09 DIAGNOSIS — M5412 Radiculopathy, cervical region: Secondary | ICD-10-CM | POA: Diagnosis present

## 2013-11-09 DIAGNOSIS — Z87891 Personal history of nicotine dependence: Secondary | ICD-10-CM

## 2013-11-09 DIAGNOSIS — M4802 Spinal stenosis, cervical region: Principal | ICD-10-CM | POA: Diagnosis present

## 2013-11-09 DIAGNOSIS — G4730 Sleep apnea, unspecified: Secondary | ICD-10-CM | POA: Diagnosis present

## 2013-11-09 DIAGNOSIS — D649 Anemia, unspecified: Secondary | ICD-10-CM | POA: Diagnosis not present

## 2013-11-09 DIAGNOSIS — M1990 Unspecified osteoarthritis, unspecified site: Secondary | ICD-10-CM | POA: Diagnosis not present

## 2013-11-09 DIAGNOSIS — M4322 Fusion of spine, cervical region: Secondary | ICD-10-CM | POA: Diagnosis not present

## 2013-11-09 HISTORY — PX: ANTERIOR CERVICAL DECOMP/DISCECTOMY FUSION: SHX1161

## 2013-11-09 SURGERY — ANTERIOR CERVICAL DECOMPRESSION/DISCECTOMY FUSION 3 LEVELS
Anesthesia: General

## 2013-11-09 MED ORDER — GLYCOPYRROLATE 0.2 MG/ML IJ SOLN
INTRAMUSCULAR | Status: AC
  Filled 2013-11-09: qty 3

## 2013-11-09 MED ORDER — GLYCOPYRROLATE 0.2 MG/ML IJ SOLN
INTRAMUSCULAR | Status: DC | PRN
  Administered 2013-11-09: 0.6 mg via INTRAVENOUS

## 2013-11-09 MED ORDER — DEXAMETHASONE SODIUM PHOSPHATE 10 MG/ML IJ SOLN
INTRAMUSCULAR | Status: DC | PRN
  Administered 2013-11-09: 10 mg via INTRAVENOUS

## 2013-11-09 MED ORDER — OXYCODONE HCL 5 MG/5ML PO SOLN: 5.0000 mg | Freq: Once | ORAL | Status: DC | PRN

## 2013-11-09 MED ORDER — MIDAZOLAM HCL 5 MG/5ML IJ SOLN
INTRAMUSCULAR | Status: DC | PRN
  Administered 2013-11-09: 2 mg via INTRAVENOUS

## 2013-11-09 MED ORDER — HYDROXYZINE HCL 10 MG PO TABS
10.0000 mg | ORAL_TABLET | Freq: Three times a day (TID) | ORAL | Status: DC
  Administered 2013-11-09 – 2013-11-11 (×3): 10 mg via ORAL
  Filled 2013-11-09 (×7): qty 1

## 2013-11-09 MED ORDER — PROMETHAZINE HCL 25 MG/ML IJ SOLN
INTRAMUSCULAR | Status: AC
  Filled 2013-11-09: qty 1

## 2013-11-09 MED ORDER — ONDANSETRON HCL 4 MG/2ML IJ SOLN: 4.0000 mg | INTRAMUSCULAR | Status: DC | PRN

## 2013-11-09 MED ORDER — SCOPOLAMINE 1 MG/3DAYS TD PT72
MEDICATED_PATCH | TRANSDERMAL | Status: AC
  Filled 2013-11-09: qty 1

## 2013-11-09 MED ORDER — DEXAMETHASONE SODIUM PHOSPHATE 4 MG/ML IJ SOLN
4.0000 mg | Freq: Four times a day (QID) | INTRAMUSCULAR | Status: DC
  Filled 2013-11-09 (×4): qty 1

## 2013-11-09 MED ORDER — VECURONIUM BROMIDE 10 MG IV SOLR
INTRAVENOUS | Status: DC | PRN
  Administered 2013-11-09: 3 mg via INTRAVENOUS
  Administered 2013-11-09: 1 mg via INTRAVENOUS

## 2013-11-09 MED ORDER — ZOLPIDEM TARTRATE 5 MG PO TABS
5.0000 mg | ORAL_TABLET | Freq: Every evening | ORAL | Status: DC | PRN
  Administered 2013-11-09 – 2013-11-10 (×2): 5 mg via ORAL
  Filled 2013-11-09 (×2): qty 1

## 2013-11-09 MED ORDER — BUSPIRONE HCL 10 MG PO TABS
10.0000 mg | ORAL_TABLET | Freq: Three times a day (TID) | ORAL | Status: DC
  Administered 2013-11-09 – 2013-11-11 (×5): 10 mg via ORAL
  Filled 2013-11-09 (×7): qty 1

## 2013-11-09 MED ORDER — LACTATED RINGERS IV SOLN
INTRAVENOUS | Status: DC
  Administered 2013-11-09: 11:00:00 via INTRAVENOUS

## 2013-11-09 MED ORDER — ACETAMINOPHEN 650 MG RE SUPP: 650.0000 mg | RECTAL | Status: DC | PRN

## 2013-11-09 MED ORDER — LAMOTRIGINE 150 MG PO TABS
150.0000 mg | ORAL_TABLET | Freq: Every morning | ORAL | Status: DC
  Administered 2013-11-10 – 2013-11-11 (×2): 150 mg via ORAL
  Filled 2013-11-09 (×2): qty 1

## 2013-11-09 MED ORDER — ROCURONIUM BROMIDE 100 MG/10ML IV SOLN
INTRAVENOUS | Status: DC | PRN
  Administered 2013-11-09: 50 mg via INTRAVENOUS

## 2013-11-09 MED ORDER — CEFAZOLIN SODIUM 1-5 GM-% IV SOLN
1.0000 g | Freq: Three times a day (TID) | INTRAVENOUS | Status: AC
  Administered 2013-11-09 – 2013-11-10 (×2): 1 g via INTRAVENOUS
  Filled 2013-11-09 (×2): qty 50

## 2013-11-09 MED ORDER — LISINOPRIL-HYDROCHLOROTHIAZIDE 10-12.5 MG PO TABS: 1.0000 | ORAL_TABLET | Freq: Every day | ORAL | Status: DC

## 2013-11-09 MED ORDER — LACTATED RINGERS IV SOLN
INTRAVENOUS | Status: DC | PRN
  Administered 2013-11-09 (×2): via INTRAVENOUS

## 2013-11-09 MED ORDER — MORPHINE SULFATE 2 MG/ML IJ SOLN
1.0000 mg | INTRAMUSCULAR | Status: DC | PRN
  Administered 2013-11-09: 2 mg via INTRAVENOUS
  Administered 2013-11-10: 4 mg via INTRAVENOUS
  Filled 2013-11-09: qty 2
  Filled 2013-11-09: qty 1

## 2013-11-09 MED ORDER — INFLUENZA VAC SPLIT QUAD 0.5 ML IM SUSY
0.5000 mL | PREFILLED_SYRINGE | INTRAMUSCULAR | Status: AC
  Administered 2013-11-10: 0.5 mL via INTRAMUSCULAR
  Filled 2013-11-09: qty 0.5

## 2013-11-09 MED ORDER — PROMETHAZINE HCL 25 MG/ML IJ SOLN
6.2500 mg | INTRAMUSCULAR | Status: DC | PRN
  Administered 2013-11-09: 6.25 mg via INTRAVENOUS

## 2013-11-09 MED ORDER — PROPOFOL 10 MG/ML IV BOLUS
INTRAVENOUS | Status: AC
  Filled 2013-11-09: qty 20

## 2013-11-09 MED ORDER — VECURONIUM BROMIDE 10 MG IV SOLR
INTRAVENOUS | Status: AC
  Filled 2013-11-09: qty 10

## 2013-11-09 MED ORDER — SODIUM CHLORIDE 0.9 % IJ SOLN
3.0000 mL | Freq: Two times a day (BID) | INTRAMUSCULAR | Status: DC
  Administered 2013-11-10 (×2): 3 mL via INTRAVENOUS

## 2013-11-09 MED ORDER — ONDANSETRON HCL 4 MG/2ML IJ SOLN
INTRAMUSCULAR | Status: DC | PRN
Start: 2013-11-09 — End: 2013-11-09
  Administered 2013-11-09: 4 mg via INTRAVENOUS

## 2013-11-09 MED ORDER — PHENOL 1.4 % MT LIQD: 1.0000 | OROMUCOSAL | Status: DC | PRN

## 2013-11-09 MED ORDER — DEXAMETHASONE 4 MG PO TABS
4.0000 mg | ORAL_TABLET | Freq: Four times a day (QID) | ORAL | Status: DC
  Administered 2013-11-09 – 2013-11-10 (×3): 4 mg via ORAL
  Filled 2013-11-09 (×5): qty 1

## 2013-11-09 MED ORDER — DIAZEPAM 5 MG PO TABS
ORAL_TABLET | ORAL | Status: AC
  Filled 2013-11-09: qty 1

## 2013-11-09 MED ORDER — THROMBIN 20000 UNITS EX SOLR
CUTANEOUS | Status: DC | PRN
  Administered 2013-11-09: 15:00:00 via TOPICAL

## 2013-11-09 MED ORDER — PRAMIPEXOLE DIHYDROCHLORIDE 0.25 MG PO TABS
0.5000 mg | ORAL_TABLET | Freq: Every day | ORAL | Status: DC
  Administered 2013-11-09 – 2013-11-10 (×2): 0.5 mg via ORAL
  Filled 2013-11-09 (×3): qty 2

## 2013-11-09 MED ORDER — HYDROMORPHONE HCL 1 MG/ML IJ SOLN
0.2500 mg | INTRAMUSCULAR | Status: DC | PRN
  Administered 2013-11-09 (×2): 0.5 mg via INTRAVENOUS

## 2013-11-09 MED ORDER — ROSUVASTATIN CALCIUM 10 MG PO TABS
10.0000 mg | ORAL_TABLET | Freq: Every day | ORAL | Status: DC
  Administered 2013-11-10: 10 mg via ORAL
  Filled 2013-11-09 (×2): qty 1

## 2013-11-09 MED ORDER — FENTANYL CITRATE 0.05 MG/ML IJ SOLN
INTRAMUSCULAR | Status: AC
  Filled 2013-11-09: qty 5

## 2013-11-09 MED ORDER — NEOSTIGMINE METHYLSULFATE 10 MG/10ML IV SOLN
INTRAVENOUS | Status: DC | PRN
  Administered 2013-11-09: 4 mg via INTRAVENOUS

## 2013-11-09 MED ORDER — DEXAMETHASONE SODIUM PHOSPHATE 10 MG/ML IJ SOLN
INTRAMUSCULAR | Status: AC
  Filled 2013-11-09: qty 1

## 2013-11-09 MED ORDER — OXYCODONE-ACETAMINOPHEN 5-325 MG PO TABS
1.0000 | ORAL_TABLET | ORAL | Status: DC | PRN
  Administered 2013-11-09 – 2013-11-11 (×9): 2 via ORAL
  Filled 2013-11-09 (×9): qty 2

## 2013-11-09 MED ORDER — SODIUM CHLORIDE 0.9 % IV SOLN: INTRAVENOUS | Status: DC

## 2013-11-09 MED ORDER — MIDAZOLAM HCL 2 MG/2ML IJ SOLN
INTRAMUSCULAR | Status: AC
  Filled 2013-11-09: qty 2

## 2013-11-09 MED ORDER — LISINOPRIL-HYDROCHLOROTHIAZIDE 10-12.5 MG PO TABS: 1.0000 | ORAL_TABLET | Freq: Every morning | ORAL | Status: DC

## 2013-11-09 MED ORDER — PROPOFOL 10 MG/ML IV BOLUS
INTRAVENOUS | Status: DC | PRN
  Administered 2013-11-09: 10 mg via INTRAVENOUS
  Administered 2013-11-09: 20 mg via INTRAVENOUS
  Administered 2013-11-09: 150 mg via INTRAVENOUS
  Administered 2013-11-09: 10 mg via INTRAVENOUS
  Administered 2013-11-09: 20 mg via INTRAVENOUS

## 2013-11-09 MED ORDER — NEOSTIGMINE METHYLSULFATE 10 MG/10ML IV SOLN
INTRAVENOUS | Status: AC
Start: 2013-11-09 — End: 2013-11-09
  Filled 2013-11-09: qty 1

## 2013-11-09 MED ORDER — ROCURONIUM BROMIDE 50 MG/5ML IV SOLN
INTRAVENOUS | Status: AC
  Filled 2013-11-09: qty 1

## 2013-11-09 MED ORDER — ARTIFICIAL TEARS OP OINT
TOPICAL_OINTMENT | OPHTHALMIC | Status: AC
  Filled 2013-11-09: qty 3.5

## 2013-11-09 MED ORDER — THROMBIN 5000 UNITS EX SOLR
OROMUCOSAL | Status: DC | PRN
  Administered 2013-11-09: 15:00:00 via TOPICAL

## 2013-11-09 MED ORDER — MENTHOL 3 MG MT LOZG: 1.0000 | LOZENGE | OROMUCOSAL | Status: DC | PRN

## 2013-11-09 MED ORDER — HYDROCHLOROTHIAZIDE 12.5 MG PO CAPS
12.5000 mg | ORAL_CAPSULE | Freq: Every day | ORAL | Status: DC
  Administered 2013-11-09 – 2013-11-11 (×2): 12.5 mg via ORAL
  Filled 2013-11-09 (×3): qty 1

## 2013-11-09 MED ORDER — HYDROMORPHONE HCL 1 MG/ML IJ SOLN
INTRAMUSCULAR | Status: AC
  Filled 2013-11-09: qty 1

## 2013-11-09 MED ORDER — ACETAMINOPHEN 325 MG PO TABS: 650.0000 mg | ORAL_TABLET | ORAL | Status: DC | PRN

## 2013-11-09 MED ORDER — LIDOCAINE HCL (CARDIAC) 20 MG/ML IV SOLN
INTRAVENOUS | Status: AC
  Filled 2013-11-09: qty 5

## 2013-11-09 MED ORDER — SCOPOLAMINE 1 MG/3DAYS TD PT72
1.0000 | MEDICATED_PATCH | TRANSDERMAL | Status: DC
  Administered 2013-11-09 (×2): 1.5 mg via TRANSDERMAL

## 2013-11-09 MED ORDER — OXYCODONE HCL 5 MG PO TABS: 5.0000 mg | ORAL_TABLET | Freq: Once | ORAL | Status: DC | PRN

## 2013-11-09 MED ORDER — DIAZEPAM 5 MG PO TABS
5.0000 mg | ORAL_TABLET | Freq: Four times a day (QID) | ORAL | Status: DC | PRN
  Administered 2013-11-09 – 2013-11-10 (×3): 5 mg via ORAL
  Filled 2013-11-09 (×2): qty 1

## 2013-11-09 MED ORDER — LISINOPRIL 10 MG PO TABS
10.0000 mg | ORAL_TABLET | Freq: Every day | ORAL | Status: DC
  Administered 2013-11-09 – 2013-11-11 (×2): 10 mg via ORAL
  Filled 2013-11-09 (×3): qty 1

## 2013-11-09 MED ORDER — ARTIFICIAL TEARS OP OINT
TOPICAL_OINTMENT | OPHTHALMIC | Status: DC | PRN
  Administered 2013-11-09: 1 via OPHTHALMIC

## 2013-11-09 MED ORDER — SODIUM CHLORIDE 0.9 % IV SOLN: 250.0000 mL | INTRAVENOUS | Status: DC

## 2013-11-09 MED ORDER — LIDOCAINE HCL (CARDIAC) 20 MG/ML IV SOLN
INTRAVENOUS | Status: DC | PRN
  Administered 2013-11-09: 80 mg via INTRAVENOUS
  Administered 2013-11-09: 10 mg via INTRAVENOUS

## 2013-11-09 MED ORDER — SODIUM CHLORIDE 0.9 % IJ SOLN: 3.0000 mL | INTRAMUSCULAR | Status: DC | PRN

## 2013-11-09 MED ORDER — 0.9 % SODIUM CHLORIDE (POUR BTL) OPTIME
TOPICAL | Status: DC | PRN
  Administered 2013-11-09: 1000 mL

## 2013-11-09 MED ORDER — STERILE WATER FOR INJECTION IJ SOLN
INTRAMUSCULAR | Status: AC
  Filled 2013-11-09: qty 10

## 2013-11-09 MED ORDER — ONDANSETRON HCL 4 MG/2ML IJ SOLN
INTRAMUSCULAR | Status: AC
  Filled 2013-11-09: qty 2

## 2013-11-09 MED ORDER — FLUOXETINE HCL 20 MG PO TABS
60.0000 mg | ORAL_TABLET | Freq: Every morning | ORAL | Status: DC
  Administered 2013-11-10 – 2013-11-11 (×2): 60 mg via ORAL
  Filled 2013-11-09 (×2): qty 3

## 2013-11-09 MED ORDER — FENTANYL CITRATE 0.05 MG/ML IJ SOLN
INTRAMUSCULAR | Status: DC | PRN
  Administered 2013-11-09: 100 ug via INTRAVENOUS
  Administered 2013-11-09 (×4): 50 ug via INTRAVENOUS
  Administered 2013-11-09 (×2): 100 ug via INTRAVENOUS
  Administered 2013-11-09: 50 ug via INTRAVENOUS

## 2013-11-09 SURGICAL SUPPLY — 75 items
BENZOIN TINCTURE PRP APPL 2/3 (GAUZE/BANDAGES/DRESSINGS) ×3 IMPLANT
BIT DRILL SM SPINE QC 12 (BIT) ×3 IMPLANT
BLADE ULTRA TIP 2M (BLADE) ×3 IMPLANT
BNDG GAUZE ELAST 4 BULKY (GAUZE/BANDAGES/DRESSINGS) ×6 IMPLANT
BUR BARREL STRAIGHT FLUTE 4.0 (BURR) IMPLANT
BUR MATCHSTICK NEURO 3.0 LAGG (BURR) ×3 IMPLANT
CANISTER SUCT 3000ML (MISCELLANEOUS) ×3 IMPLANT
CLOSURE WOUND 1/2 X4 (GAUZE/BANDAGES/DRESSINGS) ×1
CONT SPEC 4OZ CLIKSEAL STRL BL (MISCELLANEOUS) ×6 IMPLANT
COVER MAYO STAND STRL (DRAPES) ×3 IMPLANT
DRAIN JACKSON PRATT 10MM FLAT (MISCELLANEOUS) ×3 IMPLANT
DRAPE C-ARM 42X72 X-RAY (DRAPES) IMPLANT
DRAPE LAPAROTOMY 100X72 PEDS (DRAPES) ×3 IMPLANT
DRAPE MICROSCOPE LEICA (MISCELLANEOUS) ×3 IMPLANT
DRAPE POUCH INSTRU U-SHP 10X18 (DRAPES) ×3 IMPLANT
DRAPE PROXIMA HALF (DRAPES) IMPLANT
DRSG OPSITE POSTOP 4X6 (GAUZE/BANDAGES/DRESSINGS) ×3 IMPLANT
DURAPREP 6ML APPLICATOR 50/CS (WOUND CARE) ×3 IMPLANT
ELECT REM PT RETURN 9FT ADLT (ELECTROSURGICAL) ×3
ELECTRODE REM PT RTRN 9FT ADLT (ELECTROSURGICAL) ×1 IMPLANT
EVACUATOR SILICONE 100CC (DRAIN) ×3 IMPLANT
GAUZE SPONGE 4X4 12PLY STRL (GAUZE/BANDAGES/DRESSINGS) ×3 IMPLANT
GAUZE SPONGE 4X4 16PLY XRAY LF (GAUZE/BANDAGES/DRESSINGS) IMPLANT
GLOVE BIO SURGEON STRL SZ 6.5 (GLOVE) IMPLANT
GLOVE BIO SURGEON STRL SZ7 (GLOVE) IMPLANT
GLOVE BIO SURGEON STRL SZ7.5 (GLOVE) IMPLANT
GLOVE BIO SURGEON STRL SZ8 (GLOVE) ×3 IMPLANT
GLOVE BIO SURGEON STRL SZ8.5 (GLOVE) IMPLANT
GLOVE BIO SURGEONS STRL SZ 6.5 (GLOVE)
GLOVE BIOGEL M 8.0 STRL (GLOVE) ×3 IMPLANT
GLOVE ECLIPSE 6.5 STRL STRAW (GLOVE) IMPLANT
GLOVE ECLIPSE 7.0 STRL STRAW (GLOVE) IMPLANT
GLOVE ECLIPSE 7.5 STRL STRAW (GLOVE) ×9 IMPLANT
GLOVE ECLIPSE 8.0 STRL XLNG CF (GLOVE) IMPLANT
GLOVE ECLIPSE 8.5 STRL (GLOVE) IMPLANT
GLOVE EXAM NITRILE LRG STRL (GLOVE) IMPLANT
GLOVE EXAM NITRILE MD LF STRL (GLOVE) IMPLANT
GLOVE EXAM NITRILE XL STR (GLOVE) IMPLANT
GLOVE EXAM NITRILE XS STR PU (GLOVE) IMPLANT
GLOVE INDICATOR 6.5 STRL GRN (GLOVE) IMPLANT
GLOVE INDICATOR 7.0 STRL GRN (GLOVE) IMPLANT
GLOVE INDICATOR 7.5 STRL GRN (GLOVE) ×3 IMPLANT
GLOVE INDICATOR 8.0 STRL GRN (GLOVE) IMPLANT
GLOVE INDICATOR 8.5 STRL (GLOVE) ×3 IMPLANT
GLOVE OPTIFIT SS 8.0 STRL (GLOVE) IMPLANT
GLOVE SURG SS PI 6.5 STRL IVOR (GLOVE) IMPLANT
GOWN STRL REUS W/ TWL LRG LVL3 (GOWN DISPOSABLE) ×1 IMPLANT
GOWN STRL REUS W/ TWL XL LVL3 (GOWN DISPOSABLE) ×2 IMPLANT
GOWN STRL REUS W/TWL 2XL LVL3 (GOWN DISPOSABLE) IMPLANT
GOWN STRL REUS W/TWL LRG LVL3 (GOWN DISPOSABLE) ×2
GOWN STRL REUS W/TWL XL LVL3 (GOWN DISPOSABLE) ×4
HALTER HD/CHIN CERV TRACTION D (MISCELLANEOUS) ×3 IMPLANT
HEMOSTAT POWDER KIT SURGIFOAM (HEMOSTASIS) ×3 IMPLANT
KIT BASIN OR (CUSTOM PROCEDURE TRAY) ×3 IMPLANT
KIT ROOM TURNOVER OR (KITS) ×3 IMPLANT
NEEDLE SPNL 22GX3.5 QUINCKE BK (NEEDLE) ×3 IMPLANT
NS IRRIG 1000ML POUR BTL (IV SOLUTION) ×3 IMPLANT
PACK LAMINECTOMY NEURO (CUSTOM PROCEDURE TRAY) ×3 IMPLANT
PATTIES SURGICAL .5 X1 (DISPOSABLE) ×3 IMPLANT
PLATE ANT CERV XTEND 3 LV 48 (Plate) ×3 IMPLANT
PUTTY DBX 1CC (Putty) ×3 IMPLANT
PUTTY DBX 1CC DEPUY (Putty) ×1 IMPLANT
RUBBERBAND STERILE (MISCELLANEOUS) ×6 IMPLANT
SCREW XTD VAR 4.2 SELF TAP 12 (Screw) ×24 IMPLANT
SPACER ACDF SM LORDOTIC 7 (Spacer) ×3 IMPLANT
SPACER COLONIAL SMALL 8MM (Spacer) ×3 IMPLANT
SPACER COLONIAL SZ 6-7 (Spacer) ×3 IMPLANT
SPONGE INTESTINAL PEANUT (DISPOSABLE) ×6 IMPLANT
SPONGE SURGIFOAM ABS GEL 100 (HEMOSTASIS) ×3 IMPLANT
STRIP CLOSURE SKIN 1/2X4 (GAUZE/BANDAGES/DRESSINGS) ×2 IMPLANT
SUT VIC AB 3-0 SH 8-18 (SUTURE) ×3 IMPLANT
SYR 20ML ECCENTRIC (SYRINGE) ×3 IMPLANT
TOWEL OR 17X24 6PK STRL BLUE (TOWEL DISPOSABLE) ×3 IMPLANT
TOWEL OR 17X26 10 PK STRL BLUE (TOWEL DISPOSABLE) ×3 IMPLANT
WATER STERILE IRR 1000ML POUR (IV SOLUTION) ×3 IMPLANT

## 2013-11-09 NOTE — Progress Notes (Signed)
Tamara Mccullough in neuro OR notified that pt unable to obtain neck brace prior to admission due to no order.

## 2013-11-09 NOTE — Progress Notes (Signed)
Orthopedic Tech Progress Note Patient Details:  Tamara Mccullough 1955-04-02 [id number]  Ortho Devices Type of Ortho Device: Soft collar Ortho Device/Splint Interventions: Criss Alvine 11/09/2013, 5:04 PM

## 2013-11-09 NOTE — Anesthesia Preprocedure Evaluation (Addendum)
Anesthesia Evaluation  Patient identified by MRN, date of birth, ID band Patient awake    History of Anesthesia Complications (+) PONV  Airway Mallampati: I  Neck ROM: Full    Dental  (+) Teeth Intact   Pulmonary asthma , sleep apnea , former smoker,  breath sounds clear to auscultation        Cardiovascular hypertension, Rate:Normal     Neuro/Psych Seizures -,  Anxiety    GI/Hepatic Gastric bypass 3 years ago, lost 160 lbs   Endo/Other  Morbid obesity  Renal/GU      Musculoskeletal  (+) Arthritis -,   Abdominal (+) + obese,   Peds  Hematology   Anesthesia Other Findings   Reproductive/Obstetrics                         Anesthesia Physical Anesthesia Plan  ASA: II  Anesthesia Plan: General   Post-op Pain Management:    Induction: Intravenous  Airway Management Planned: Oral ETT  Additional Equipment:   Intra-op Plan:   Post-operative Plan: Extubation in OR  Informed Consent: I have reviewed the patients History and Physical, chart, labs and discussed the procedure including the risks, benefits and alternatives for the proposed anesthesia with the patient or authorized representative who has indicated his/her understanding and acceptance.   Dental advisory given  Plan Discussed with: CRNA and Surgeon  Anesthesia Plan Comments:         Anesthesia Quick Evaluation

## 2013-11-09 NOTE — Progress Notes (Signed)
No changes in the last 17 hours. For acdf c4 to c7

## 2013-11-09 NOTE — Transfer of Care (Signed)
Immediate Anesthesia Transfer of Care Note  Patient: Tamara Mccullough  Procedure(s) Performed: Procedure(s):  Anterior cervical decompression/diskectomy/fusion cervical four-five ,cervical five-six,cervical six-seven (N/A)  Patient Location: PACU  Anesthesia Type:General  Level of Consciousness: awake, oriented, patient cooperative and responds to stimulation  Airway & Oxygen Therapy: Patient Spontanous Breathing and Patient connected to nasal cannula oxygen  Post-op Assessment: Report given to PACU RN, Post -op Vital signs reviewed and stable and Patient moving all extremities X 4  Post vital signs: Reviewed and stable  Complications: No apparent anesthesia complications

## 2013-11-09 NOTE — Progress Notes (Signed)
Pt. Removed first scop patch, applied another one

## 2013-11-10 ENCOUNTER — Ambulatory Visit: Payer: Medicare Other | Admitting: Nurse Practitioner

## 2013-11-10 DIAGNOSIS — M4802 Spinal stenosis, cervical region: Secondary | ICD-10-CM | POA: Diagnosis not present

## 2013-11-10 MED ORDER — DOCUSATE SODIUM 100 MG PO CAPS
100.0000 mg | ORAL_CAPSULE | Freq: Two times a day (BID) | ORAL | Status: DC
  Administered 2013-11-10 – 2013-11-11 (×3): 100 mg via ORAL
  Filled 2013-11-10 (×3): qty 1

## 2013-11-10 MED ORDER — DIAZEPAM 5 MG PO TABS
5.0000 mg | ORAL_TABLET | Freq: Four times a day (QID) | ORAL | Status: DC | PRN
  Administered 2013-11-10 – 2013-11-11 (×3): 10 mg via ORAL
  Filled 2013-11-10 (×3): qty 2

## 2013-11-10 NOTE — Progress Notes (Signed)
Patient ID: Tamara Mccullough, female   DOB: [birth year], 58 y.o.   MRN: [id number] Stable, no weakness. C/o pain left ear. Decrease of drainage. See orders

## 2013-11-10 NOTE — Progress Notes (Signed)
Utilization review completed.  

## 2013-11-10 NOTE — Progress Notes (Signed)
PT Cancellation Note  Patient Details Name: Tamara Mccullough MRN: [id number] DOB: 1955/02/13   Cancelled Treatment:    Reason Eval/Treat Not Completed: PT screened, no needs identified, will sign off. 11/10/2013  Donnella Sham, [place] [phone]  (pager)   Christopher Glasscock, Tessie Fass 11/10/2013, 12:09 PM

## 2013-11-10 NOTE — Op Note (Signed)
NAME:  RENAD, JENNIGES NO.:  [id number]  MEDICAL RECORD NO.:  [id number]  LOCATION:  3C05C                        FACILITY:  [facility]  PHYSICIAN:  Leeroy Cha, M.D.   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  PROCEDURE:  Anterior 4-5, 5-6, 6-7 diskectomy; decompression of spinal cord; bilateral foraminotomy; interbody fusion with cages; plate microscope.  SURGEON:  Leeroy Cha, M.D.  ASSISTANT:  Marchia Meiers. Vertell Limber, M.D.  CLINICAL HISTORY:  Ms. Reddix is a lady who had been followed by me because of neck pain ___with radiation o both arms_______ extremity.  Clinically, she had weakness of the biceps, deltoid, and triceps.  She also __has some________ burning pain in the hands.  X-ray showed stenosis at the L4-5, 5-6, 6-7.  Surgery was advised after failure with conservative treatment__________ .  Patient knew the risk of the surgery including possibility of hematoma.  This lady, several months ago, underwent thyroidectomy after I found that she has a large thyroid.  PROCEDURE IN DETAIL:  Patient was taken to the OR.  After intubation, we inspected the vocal cord.  This was done prior to intubation. Nevertheless, ENT has seen her before and the vocal cords were normal. Because of that, we decided to proceed with the left side.  The left side of the neck was cleaned with DuraPrep.  Drapes were applied. Transverse incision was made through the skin, subcutaneous tissue.  We found quite a bit of adhesion.  Lysis was accomplished.  We opened the platysma and we dissected all the way posterior to the cervical area. The x-ray showed that we were right at the level of  L4-5.  Patient had quite a bit of narrowing, and we had to bring the microscope and started using the drill to be able to remove the calcified anterior ligament. We entered disk space.  Diskectomy was achieved.  Patient had quite a bit of degenerative changes in the disk.  The posterior ligament which was calcified was opened and decompressed medially and laterally, was done to decompress the C5 nerve root.  The same procedure was done at L5- 6 with the same finding.  At the level of C6-7, it was quite more difficulty.  Patient had a large osteophyte with calcification of the ligament.  We had to dissect all the way posterior and decompress the spinal cord and the C7 nerve root.  Then, the endplate were drilled at those 3 levels.  A cage of 8 mm lordotic was inserted at the level of C6- 7, at L5-6 it was 7 mm height, and at the level of 4-5 was 6.  We had DBX and autograft inside the cage.  This was followed with a plate using 8 screws.  Lateral cervical spine showed good position of the plate in the upper part.  Because of the shoulder, we were unable to see the lower part.  The area was irrigated.  Hemostasis was achieved again with the bipolar.  At this time i waited__________ 20 minutes just to be sure that we had good hemostasis. Nevertheless, a drain was left in the precervical area and the wound was closed with Vicryl and Steri-Strips.          ______________________________ Leeroy Cha, M.D.     EB/MEDQ  D:  11/09/2013  T:  11/10/2013  Job:  [id number]

## 2013-11-10 NOTE — Evaluation (Signed)
Occupational Therapy Evaluation Patient Details Name: Tamara Mccullough MRN: [id number] DOB: 02/19/1955 Today's Date: 11/10/2013    History of Present Illness 58 y.o. s/p Anterior 4-5, 5-6, 6-7 diskectomy; decompression of spinal cord; bilateral foraminotomy; interbody fusion with cages.   Clinical Impression   Pt s/p above. Education provided during session and no further OT needs. Theraputty given to pt and handout.     Follow Up Recommendations  No OT follow up    Equipment Recommendations  Other (comment) (pt to work out Civil engineer, contracting)    Recommendations for Other Services       Precautions / Restrictions Precautions Precautions: Cervical Precaution Comments: educated on precautions Required Braces or Orthoses: Cervical Brace Cervical Brace: Soft collar Restrictions Weight Bearing Restrictions: No      Mobility Bed Mobility Overal bed mobility: Needs Assistance Bed Mobility: Rolling;Sidelying to Sit;Sit to Sidelying Rolling: Supervision Sidelying to sit: Supervision     Sit to sidelying: Supervision General bed mobility comments: cues for technique.  Transfers Overall transfer level: Needs assistance   Transfers: Sit to/from Stand Sit to Stand: Supervision         General transfer comment: cues for technique.    Balance                                            ADL Overall ADL's : Needs assistance/impaired                     Lower Body Dressing: Set up;Supervision/safety;Sit to/from stand   Toilet Transfer: Supervision/safety;Ambulation;Modified Independent;Regular Toilet;Grab bars (supervision for transfer; Mod I for ambulation)           Functional mobility during ADLs: Modified independent General ADL Comments: Educated on what pt could use for toilet aide for hygiene as she reports this is difficult. Also, discussed what she could use to wash bottom. Educated on use of cup/straw. Reviewed technique for LB ADLs.  Discussed functional activities and ways to try to maintain cervical precautions. Discussed UB clothing.  Performed theraputty exercises with hands. Recommended having daughter with her for shower transfer.     Vision                     Perception     Praxis      Pertinent Vitals/Pain Pain Assessment: 0-10 Pain Score: 5  Pain Location: neck Pain Intervention(s): Monitored during session     Hand Dominance Right   Extremity/Trunk Assessment Upper Extremity Assessment Upper Extremity Assessment: RUE deficits/detail;LUE deficits/detail RUE Deficits / Details: weak grasp; h/o carpal tunnel LUE Deficits / Details: weak grasp   Lower Extremity Assessment Lower Extremity Assessment: Overall WFL for tasks assessed       Communication Communication Communication: No difficulties   Cognition Arousal/Alertness: Awake/alert Behavior During Therapy: WFL for tasks assessed/performed Overall Cognitive Status: Within Functional Limits for tasks assessed                     General Comments       Exercises Exercises: Other exercises Other Exercises Other Exercises: theraputty exercises for hands   Shoulder Instructions      Home Living Family/patient expects to be discharged to:: Private residence Living Arrangements: Alone Available Help at Discharge: Family;Available 24 hours/day (daughter staying with her for a few days) Type of Home: Mobile home Home Access: Stairs to  enter Entrance Stairs-Number of Steps: 3 Entrance Stairs-Rails:  (neighbor putting up rail) Home Layout: One level     Bathroom Shower/Tub: Occupational psychologist: Standard (sink close)     Home Equipment: Hand held shower head;Adaptive equipment (thinks she can get shower chair) Adaptive Equipment: Reacher        Prior Functioning/Environment               OT Diagnosis: Acute pain   OT Problem List:     OT Treatment/Interventions:      OT Goals(Current  goals can be found in the care plan section)    OT Frequency:     Barriers to D/C:            Co-evaluation              End of Session Equipment Utilized During Treatment: Cervical collar  Activity Tolerance: Patient tolerated treatment well Patient left: in bed   Time:  ([phone] and [phone])- time to go get AE and theraputty OT Time Calculation (min): 32 min Charges:  OT General Charges $OT Visit: 1 Procedure OT Evaluation $Initial OT Evaluation Tier I: 1 Procedure OT Treatments $Self Care/Home Management : 8-22 mins G-CodesBenito Mccreedy OTR/L [phone] 11/10/2013, 9:50 AM

## 2013-11-11 ENCOUNTER — Encounter (HOSPITAL_COMMUNITY): Payer: Self-pay | Admitting: Neurosurgery

## 2013-11-11 MED ORDER — BISACODYL 10 MG RE SUPP
10.0000 mg | Freq: Every day | RECTAL | Status: DC | PRN
  Administered 2013-11-11: 10 mg via RECTAL
  Filled 2013-11-11: qty 1

## 2013-11-11 MED ORDER — POLYETHYLENE GLYCOL 3350 17 G PO PACK
17.0000 g | PACK | Freq: Every day | ORAL | Status: DC | PRN
  Filled 2013-11-11: qty 1

## 2013-11-11 NOTE — Progress Notes (Signed)
Patient alert and oriented, mae's well, voiding adequate amount of urine, swallowing without difficulty, no c/o pain. Patient discharged home with family. Script and discharged instructions given to patient. Patient and family stated understanding of d/c instructions given and has an appointment with MD. 

## 2013-11-11 NOTE — Discharge Summary (Signed)
Physician Discharge Summary  Patient ID: Tamara Mccullough MRN: [id number] DOB/AGE: [birth year] 58 y.o.  Admit date: 11/09/2013 Discharge date: 11/11/2013  Admission Diagnoses:cervical stenosis  Discharge Diagnoses:  Active Problems:   Cervical stenosis of spinal canal   Discharged Condition: ambulating  Hospital Course: surgery  Consults: none  Significant Diagnostic Studies: myelogram  Treatments: cervical fusion  Discharge Exam: Blood pressure 101/67, pulse 68, temperature 97.9 F (36.6 C), temperature source Oral, resp. rate 18, height 5' 1.81" (1.57 m), weight 111.131 kg (245 lb), SpO2 97.00%. No weakness  Disposition: home . To see me in 3 weeks    Medication List    ASK your doctor about these medications       busPIRone 10 MG tablet  Commonly known as:  BUSPAR  Take 10 mg by mouth 3 (three) times daily.     diclofenac sodium 1 % Gel  Commonly known as:  VOLTAREN  Apply 2 g topically 3 (three) times daily.     docusate sodium 50 MG capsule  Commonly known as:  COLACE  Take 150 mg by mouth daily.     FLUoxetine 20 MG tablet  Commonly known as:  PROZAC  Take 60 mg by mouth every morning.     hydrOXYzine 10 MG tablet  Commonly known as:  ATARAX/VISTARIL  Take 10 mg by mouth 3 (three) times daily.     lamoTRIgine 150 MG tablet  Commonly known as:  LAMICTAL  Take 150 mg by mouth every morning.     lisinopril-hydrochlorothiazide 10-12.5 MG per tablet  Commonly known as:  PRINZIDE,ZESTORETIC  Take 1 tablet by mouth every morning.     lisinopril-hydrochlorothiazide 10-12.5 MG per tablet  Commonly known as:  PRINZIDE,ZESTORETIC  TAKE 1 TABLET BY MOUTH DAILY.     multivitamin with minerals Tabs tablet  Take 1 tablet by mouth daily.     pramipexole 0.5 MG tablet  Commonly known as:  MIRAPEX  Take 1 tablet (0.5 mg total) by mouth at bedtime.     rosuvastatin 10 MG tablet  Commonly known as:  CRESTOR  Take 1 tablet (10 mg total) by mouth daily.      temazepam 30 MG capsule  Commonly known as:  RESTORIL  Take 30 mg by mouth at bedtime.     traMADol 50 MG tablet  Commonly known as:  ULTRAM  Take 100 mg by mouth 3 (three) times daily as needed (Pain).     vitamin B-12 1000 MCG tablet  Commonly known as:  CYANOCOBALAMIN  Take 2,000 mcg by mouth every morning.     zolpidem 10 MG tablet  Commonly known as:  AMBIEN  Take 10 mg by mouth at bedtime as needed for sleep.         Signed: Floyce Stakes 11/11/2013, 9:58 AM

## 2013-11-11 NOTE — Discharge Instructions (Signed)
Wound Care Leave incision open to air after 3 days. Do not scrub directly on incision.  Leave steri-strips on neck.  They will fall off themselves. Do not put any creams, lotions, or ointments on incision. Activity Walk each and every day, increasing distance each day. No lifting greater than 5 lbs.  Avoid excessive neck motion. No driving for 2 weeks; may ride as a passenger locally. Wear neck brace at all times except when showering.  If provided soft collar, may wear for comfort unless otherwise instructed. Diet Resume your normal diet.  Return to Work Will be discussed at you follow up appointment. Call Your Doctor If Any of These Occur Redness, drainage, or swelling at the wound.  Temperature greater than 101 degrees. Severe pain not relieved by pain medication. Increased difficulty swallowing. Incision starts to come apart. Follow Up Appt Call today for appointment in 3-4 weeks ([phone]) or for problems.  If you have any hardware placed in your spine, you will need an x-ray before your appointment.

## 2013-11-12 NOTE — Anesthesia Postprocedure Evaluation (Signed)
  Anesthesia Post-op Note  Patient: Tamara Mccullough  Procedure(s) Performed: Procedure(s):  Anterior cervical decompression/diskectomy/fusion cervical four-five ,cervical five-six,cervical six-seven (N/A)  Patient Location: PACU  Anesthesia Type:General  Level of Consciousness: awake and alert   Airway and Oxygen Therapy: Patient Spontanous Breathing  Post-op Pain: mild  Post-op Assessment: Post-op Vital signs reviewed  Post-op Vital Signs: stable  Last Vitals:  Filed Vitals:   11/11/13 1159  BP: 125/56  Pulse: 76  Temp: 36.7 C  Resp: 20    Complications: No apparent anesthesia complications

## 2013-12-02 DIAGNOSIS — M4802 Spinal stenosis, cervical region: Secondary | ICD-10-CM | POA: Diagnosis not present

## 2013-12-02 DIAGNOSIS — Z6839 Body mass index (BMI) 39.0-39.9, adult: Secondary | ICD-10-CM | POA: Diagnosis not present

## 2013-12-13 ENCOUNTER — Ambulatory Visit: Payer: Medicare Other | Admitting: Nurse Practitioner

## 2013-12-20 ENCOUNTER — Ambulatory Visit (INDEPENDENT_AMBULATORY_CARE_PROVIDER_SITE_OTHER): Payer: Medicare Other | Admitting: Nurse Practitioner

## 2013-12-20 ENCOUNTER — Encounter: Payer: Self-pay | Admitting: Nurse Practitioner

## 2013-12-20 VITALS — BP 134/88 | HR 70 | Temp 98.1°F | Ht 62.0 in | Wt 244.0 lb

## 2013-12-20 DIAGNOSIS — I10 Essential (primary) hypertension: Secondary | ICD-10-CM

## 2013-12-20 DIAGNOSIS — F411 Generalized anxiety disorder: Secondary | ICD-10-CM | POA: Diagnosis not present

## 2013-12-20 DIAGNOSIS — F5105 Insomnia due to other mental disorder: Secondary | ICD-10-CM | POA: Diagnosis not present

## 2013-12-20 DIAGNOSIS — F409 Phobic anxiety disorder, unspecified: Secondary | ICD-10-CM

## 2013-12-20 DIAGNOSIS — G2581 Restless legs syndrome: Secondary | ICD-10-CM

## 2013-12-20 DIAGNOSIS — E785 Hyperlipidemia, unspecified: Secondary | ICD-10-CM

## 2013-12-20 DIAGNOSIS — E669 Obesity, unspecified: Secondary | ICD-10-CM

## 2013-12-20 DIAGNOSIS — F331 Major depressive disorder, recurrent, moderate: Secondary | ICD-10-CM

## 2013-12-20 MED ORDER — LISINOPRIL-HYDROCHLOROTHIAZIDE 10-12.5 MG PO TABS: 1.0000 | ORAL_TABLET | Freq: Every morning | ORAL | Status: DC

## 2013-12-20 NOTE — Progress Notes (Signed)
Subjective:    Patient ID: Tamara Mccullough, female    DOB: 1955/09/23, 58 y.o.   MRN: [id number]\  Patient here today for follow up of chronic medical problems.  Hypertension This is a chronic problem. The current episode started more than 1 year ago. The problem is controlled. Neck pain: chronic. Risk factors for coronary artery disease include dyslipidemia, family history, obesity, post-menopausal state and stress. Past treatments include ACE inhibitors and diuretics. The current treatment provides significant improvement. Compliance problems include diet and exercise.   Hyperlipidemia This is a chronic problem. The current episode started more than 1 year ago. The problem is controlled. Recent lipid tests were reviewed and are normal. Exacerbating diseases include obesity. She has no history of diabetes or hypothyroidism. Associated symptoms include myalgias. Current antihyperlipidemic treatment includes statins. The current treatment provides moderate improvement of lipids. Compliance problems include adherence to diet and adherence to exercise.  Risk factors for coronary artery disease include dyslipidemia, family history, hypertension, obesity and post-menopausal.  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 SHe is also on prozac daily without side effects. bipolar See's dr. redding and is on lamictal daily- she is doing quite well right now.  * Had cervical fusion Oct 6,2015 and had goiter removed august 2015. Review of Systems  HENT: Positive for congestion, ear pain and sinus pressure.   Respiratory: Positive for cough.   Musculoskeletal: Positive for myalgias, back pain (chronic) and joint swelling. Neck pain: chronic.  All other systems reviewed and are negative.      Objective:   Physical Exam   Constitutional: She is oriented to person, place, and time. She appears well-developed and well-nourished.  HENT:  Nose: Nose normal.  Mouth/Throat: Oropharynx is clear and moist.  Eyes: EOM are normal.  Neck: Trachea normal, normal range of motion and full passive range of motion without pain. Neck supple. No JVD present. Carotid bruit is not present. No thyromegaly present.  Cardiovascular: Normal rate, regular rhythm, normal heart sounds and intact distal pulses.  Exam reveals no gallop and no friction rub.   No murmur heard. Pulmonary/Chest: Effort normal and breath sounds normal.  Abdominal: Soft. Bowel sounds are normal. She exhibits no distension and no mass. There is no tenderness.  Musculoskeletal: Normal range of motion.  Soft cervical collar in place  Lymphadenopathy:    She has no cervical adenopathy.  Neurological: She is alert and oriented to person, place, and time. She has normal reflexes.  Skin: Skin is warm and dry.  Psychiatric: She has a normal mood and affect. Her behavior is normal. Judgment and thought content normal.    BP 134/88 mmHg  Pulse 70  Temp(Src) 98.1 F (36.7 C) (Oral)  Ht '5\' 2"'  (1.575 m)  Wt 244 lb (110.678 kg)  BMI 44.62 kg/m2       Assessment & Plan:   1. Essential hypertension, benign Low NA diet - lisinopril-hydrochlorothiazide (PRINZIDE,ZESTORETIC) 10-12.5 MG per tablet; Take 1 tablet by mouth every morning.  Dispense: 90 tablet; Refill: 1 - CMP14+EGFR  2. GAD (generalized anxiety disorder) Continue to see DR. Redding  3. Major depressive disorder, recurrent episode, moderate Continue to see Dr. Lin Landsman  4. Obesity Discussed diet and exercise for person with BMI >25 Will recheck weight in 3-6 months   5. RLS (restless legs syndrome) Keeps legs warm at night  6. Insomnia due to anxiety and fear Bedtime ritual  7. Hyperlipidemia with target LDL less than 100 Low fat diet - NMR, lipoprofile   Patient to schedule  mammo Labs pending Health maintenance reviewed Diet and exercise encouraged Continue all meds Follow up  In 3 months   [place], FNP

## 2013-12-20 NOTE — Patient Instructions (Signed)

## 2013-12-21 LAB — CMP14+EGFR
A/G RATIO: 2 (ref 1.1–2.5)
ALK PHOS: 117 IU/L (ref 39–117)
ALT: 15 IU/L (ref 0–32)
AST: 20 IU/L (ref 0–40)
Albumin: 4.7 g/dL (ref 3.5–5.5)
BILIRUBIN TOTAL: 0.4 mg/dL (ref 0.0–1.2)
BUN/Creatinine Ratio: 15 (ref 9–23)
BUN: 15 mg/dL (ref 6–24)
CO2: 21 mmol/L (ref 18–29)
CREATININE: 1.03 mg/dL — AB (ref 0.57–1.00)
Calcium: 8.7 mg/dL (ref 8.7–10.2)
Chloride: 102 mmol/L (ref 97–108)
GFR calc Af Amer: 69 mL/min/{1.73_m2} (ref >59)
GFR, EST NON AFRICAN AMERICAN: 60 mL/min/{1.73_m2} (ref >59)
GLOBULIN, TOTAL: 2.4 g/dL (ref 1.5–4.5)
Glucose: 102 mg/dL — ABNORMAL HIGH (ref 65–99)
Potassium: 4.7 mmol/L (ref 3.5–5.2)
SODIUM: 143 mmol/L (ref 134–144)
Total Protein: 7.1 g/dL (ref 6.0–8.5)

## 2013-12-21 LAB — NMR, LIPOPROFILE
Cholesterol: 159 mg/dL (ref 100–199)
HDL Cholesterol by NMR: 72 mg/dL (ref >39)
HDL Particle Number: 44.1 umol/L (ref >=30.5)
LDL Particle Number: 1020 nmol/L — ABNORMAL HIGH (ref <1000)
LDL SIZE: 21.2 nm (ref >20.5)
LDL-C: 73 mg/dL (ref 0–99)
SMALL LDL PARTICLE NUMBER: 474 nmol/L (ref <=527)
Triglycerides by NMR: 70 mg/dL (ref 0–149)

## 2013-12-24 ENCOUNTER — Encounter: Payer: Self-pay | Admitting: *Deleted

## 2013-12-28 ENCOUNTER — Other Ambulatory Visit: Payer: Self-pay | Admitting: Nurse Practitioner

## 2013-12-29 MED ORDER — ROSUVASTATIN CALCIUM 10 MG PO TABS: 10.0000 mg | ORAL_TABLET | Freq: Every day | ORAL | Status: DC

## 2013-12-29 NOTE — Telephone Encounter (Signed)
done

## 2014-01-13 ENCOUNTER — Other Ambulatory Visit: Payer: Self-pay | Admitting: Family Medicine

## 2014-01-13 DIAGNOSIS — Z6837 Body mass index (BMI) 37.0-37.9, adult: Secondary | ICD-10-CM | POA: Diagnosis not present

## 2014-01-13 DIAGNOSIS — F3181 Bipolar II disorder: Secondary | ICD-10-CM | POA: Diagnosis not present

## 2014-01-13 DIAGNOSIS — M4802 Spinal stenosis, cervical region: Secondary | ICD-10-CM | POA: Diagnosis not present

## 2014-01-18 ENCOUNTER — Ambulatory Visit (INDEPENDENT_AMBULATORY_CARE_PROVIDER_SITE_OTHER): Payer: Medicare Other | Admitting: Family Medicine

## 2014-01-18 ENCOUNTER — Telehealth: Payer: Self-pay | Admitting: Nurse Practitioner

## 2014-01-18 ENCOUNTER — Encounter: Payer: Self-pay | Admitting: Family Medicine

## 2014-01-18 VITALS — BP 130/68 | HR 75 | Temp 97.9°F | Ht 62.0 in | Wt 243.0 lb

## 2014-01-18 DIAGNOSIS — K5732 Diverticulitis of large intestine without perforation or abscess without bleeding: Secondary | ICD-10-CM | POA: Diagnosis not present

## 2014-01-18 MED ORDER — CIPROFLOXACIN HCL 500 MG PO TABS: 500.0000 mg | ORAL_TABLET | Freq: Two times a day (BID) | ORAL | Status: DC

## 2014-01-18 NOTE — Progress Notes (Signed)
   Subjective:    Patient ID: Tamara Mccullough, female    DOB: May 04, 1955, 58 y.o.   MRN: [id number]  HPI Patient is having some LUQ abdominal pain and she is concerned it could be shingles.  She does not have rash or pain in the dermatone area but she has persistent LUQ abdominal discomfort for 2 days.  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  She has hx of IDA and she states she had cbc recently that was normal.  Review of Systems  Constitutional: Negative for fever.  HENT: Negative for ear pain.   Eyes: Negative for discharge.  Respiratory: Negative for cough.   Cardiovascular: Negative for chest pain.  Gastrointestinal: Positive for abdominal pain. Negative for abdominal distention.  Endocrine: Negative for polyuria.  Genitourinary: Negative for difficulty urinating.  Musculoskeletal: Negative for gait problem and neck pain.  Skin: Negative for color change and rash.  Neurological: Negative for speech difficulty and headaches.  Psychiatric/Behavioral: Negative for agitation.       Objective:    BP 130/68 mmHg  Pulse 75  Temp(Src) 97.9 F (36.6 C) (Oral)  Ht 5\' 2"  (1.575 m)  Wt 243 lb (110.224 kg)  BMI 44.43 kg/m2   Physical Exam  Constitutional: She is oriented to person, place, and time. She appears well-developed and well-nourished.  HENT:  Head: Normocephalic and atraumatic.  Mouth/Throat: Oropharynx is clear and moist.  Eyes: Pupils are equal, round, and reactive to light.  Neck: Normal range of motion. Neck supple.  Cardiovascular: Normal rate and regular rhythm.   No murmur heard. Pulmonary/Chest: Effort normal and breath sounds normal.  Abdominal: Soft. Bowel sounds are normal. There is tenderness.  TTP left upper quadrant of abdomen w/o guarding.  Neurological: She is alert and oriented to person, place, and time.  Skin: Skin is warm and dry.  Psychiatric: She has a normal mood and affect.          Assessment & Plan:     ICD-9-CM ICD-10-CM   1.  Diverticulitis of colon 562.11 K57.32 ciprofloxacin (CIPRO) 500 MG tablet     No Follow-up on file.  Lysbeth Penner FNP

## 2014-01-18 NOTE — Telephone Encounter (Signed)
Appointment given for today at 5:30 with Tamara Mccullough.

## 2014-01-31 ENCOUNTER — Ambulatory Visit: Payer: Medicare Other | Admitting: Family Medicine

## 2014-02-16 ENCOUNTER — Encounter: Payer: Self-pay | Admitting: Nurse Practitioner

## 2014-02-16 ENCOUNTER — Ambulatory Visit (INDEPENDENT_AMBULATORY_CARE_PROVIDER_SITE_OTHER): Payer: Medicare Other | Admitting: Nurse Practitioner

## 2014-02-16 VITALS — BP 143/84 | HR 72 | Temp 97.1°F | Ht 62.0 in | Wt 239.0 lb

## 2014-02-16 DIAGNOSIS — B372 Candidiasis of skin and nail: Secondary | ICD-10-CM | POA: Diagnosis not present

## 2014-02-16 DIAGNOSIS — M25521 Pain in right elbow: Secondary | ICD-10-CM | POA: Diagnosis not present

## 2014-02-16 MED ORDER — METHYLPREDNISOLONE ACETATE 80 MG/ML IJ SUSP
80.0000 mg | Freq: Once | INTRAMUSCULAR | Status: AC
  Administered 2014-02-16: 80 mg via INTRAMUSCULAR

## 2014-02-16 MED ORDER — NYSTATIN 100000 UNIT/GM EX CREA: 1.0000 "application " | TOPICAL_CREAM | Freq: Two times a day (BID) | CUTANEOUS | Status: DC

## 2014-02-16 NOTE — Progress Notes (Signed)
   Subjective:    Patient ID: Tamara Mccullough, female    DOB: 09/23/1955, 59 y.o.   MRN: [id number]  HPI Patient in today c/o rash in between legs- started about 2 weeks ago and is very itchy- Has tried vitamin e oil and corn starch- no better. She is also c/o right elbow pain that is causing numbness in right 4th and fifth finger. This started about the same time as the rash showed up. Rates pain 5/10 . Tylenol OC no relief.    Review of Systems  Constitutional: Negative.   HENT: Negative.   Respiratory: Negative.   Cardiovascular: Negative.   Genitourinary: Negative.   Psychiatric/Behavioral: Negative.   All other systems reviewed and are negative.      Objective:   Physical Exam  Constitutional: She appears well-developed and well-nourished.  Cardiovascular: Normal rate, regular rhythm and normal heart sounds.   Pulmonary/Chest: Effort normal and breath sounds normal.  Musculoskeletal:  Pain on palpation with shock sensation down to fifth finger when palpating medial elbow. Grips equal bil sensation intact all fingers on right  Skin:  Erythematous moist appearing rash under pendulous abdomen at top of bil thighs   BP 143/84 mmHg  Pulse 72  Temp(Src) 97.1 F (36.2 C) (Oral)  Ht 5\' 2"  (1.575 m)  Wt 239 lb (108.41 kg)  BMI 43.70 kg/m2        Assessment & Plan:  1. Cutaneous candidiasis Keep area as dry as possible Do  Not scratch or rub - nystatin cream (MYCOSTATIN); Apply 1 application topically 2 (two) times daily.  Dispense: 30 g; Refill: 11  2. Right elbow pain Rest elbow Moist heat if helps Tylenol OTC as needed Rto if not improving - methylPREDNISolone acetate (DEPO-MEDROL) injection 80 mg; Inject 1 mL (80 mg total) into the muscle once.   Mary-Margaret Hassell Done, FNP

## 2014-02-16 NOTE — Patient Instructions (Signed)
Medial Epicondylitis (Golfer's Elbow) with Rehab Medial epicondylitis involves inflammation and pain around the inner (medial) portion of the elbow. This pain is caused by inflammation of the tendons in the forearm that flex (bring down) the wrist. Medial epicondylitis is also called golfer's elbow, because it is common among golfers. However, it may occur in any individual who flexes the wrist regularly. If medial epicondylitis is left untreated, it may become a chronic problem. SYMPTOMS   Pain, tenderness, or inflammation over the inner (medial) side of the elbow.  Pain or weakness with gripping activities.  Pain that increases with wrist twisting motions (using a screwdriver, playing golf, bowling). CAUSES  Medial epicondylitis is caused by inflammation of the tendons that flex the wrist. Causes of injury may include:  Chronic, repetitive stress and strain to the tendons that run from the wrist and forearm to the elbow.  Sudden strain on the forearm, including wrist snap when serving balls with racquet sports, or throwing a baseball. RISK INCREASES WITH:  Sports or occupations that require repetitive and/or strenuous forearm and wrist movements (pitching a baseball, golfing, carpentry).  Poor wrist and forearm strength and flexibility.  Failure to warm up properly before activity.  Resuming activity before healing, rehabilitation, and conditioning are complete. PREVENTION   Warm up and stretch properly before activity.  Maintain physical fitness:  Strength, flexibility, and endurance.  Cardiovascular fitness.  Wear and use properly fitted equipment.  Learn and use proper technique and have a coach correct improper technique.  Wear a tennis elbow (counterforce) brace. PROGNOSIS  The course of this condition depends on the degree of the injury. If treated properly, acute cases (symptoms lasting less than 4 weeks) are often resolved in 2 to 6 weeks. Chronic (longer lasting  cases) often resolve in 3 to 6 months, but may require physical therapy. RELATED COMPLICATIONS   Frequently recurring symptoms, resulting in a chronic problem. Properly treating the problem the first time decreases frequency of recurrence.  Chronic inflammation, scarring, and partial tendon tear, requiring surgery.  Delayed healing or resolution of symptoms. TREATMENT  Treatment first involves the use of ice and medicine, to reduce pain and inflammation. Strengthening and stretching exercises may reduce discomfort, if performed regularly. These exercises may be performed at home, if the condition is an acute injury. Chronic cases may require a referral to a physical therapist for evaluation and treatment. Your caregiver may advise a corticosteroid injection to help reduce inflammation. Rarely, surgery is needed. MEDICATION  If pain medicine is needed, nonsteroidal anti-inflammatory medicines (aspirin and ibuprofen), or other minor pain relievers (acetaminophen), are often advised.  Do not take pain medicine for 7 days before surgery.  Prescription pain relievers may be given, if your caregiver thinks they are needed. Use only as directed and only as much as you need.  Corticosteroid injections may be recommended. These injections should be reserved only for the most severe cases, because they can only be given a certain number of times. HEAT AND COLD  Cold treatment (icing) should be applied for 10 to 15 minutes every 2 to 3 hours for inflammation and pain, and immediately after activity that aggravates your symptoms. Use ice packs or an ice massage.  Heat treatment may be used before performing stretching and strengthening activities prescribed by your caregiver, physical therapist, or athletic trainer. Use a heat pack or a warm water soak. SEEK MEDICAL CARE IF: Symptoms get worse or do not improve in 2 weeks, despite treatment. EXERCISES  RANGE OF MOTION (  ROM) AND STRETCHING EXERCISES -  Epicondylitis, Medial (Golfer's Elbow) These exercises may help you when beginning to rehabilitate your injury. Your symptoms may go away with or without further involvement from your physician, physical therapist or athletic trainer. While completing these exercises, remember:   Restoring tissue flexibility helps normal motion to return to the joints. This allows healthier, less painful movement and activity.  An effective stretch should be held for at least 30 seconds.  A stretch should never be painful. You should only feel a gentle lengthening or release in the stretched tissue. RANGE OF MOTION - Wrist Flexion, Active-Assisted  Extend your right / left elbow with your fingers pointing down.*  Gently pull the back of your hand towards you, until you feel a gentle stretch on the top of your forearm.  Hold this position for __________ seconds. Repeat __________ times. Complete this exercise __________ times per day.  *If directed by your physician, physical therapist or athletic trainer, complete this stretch with your elbow bent, rather than extended. RANGE OF MOTION - Wrist Extension, Active-Assisted  Extend your right / left elbow and turn your palm upwards.*  Gently pull your palm and fingertips back, so your wrist extends and your fingers point more toward the ground.  You should feel a gentle stretch on the inside of your forearm.  Hold this position for __________ seconds. Repeat __________ times. Complete this exercise __________ times per day. *If directed by your physician, physical therapist or athletic trainer, complete this stretch with your elbow bent, rather than extended. STRETCH - Wrist Extension   Place your right / left fingertips on a tabletop leaving your elbow slightly bent. Your fingers should point backwards.  Gently press your fingers and palm down onto the table, by straightening your elbow. You should feel a stretch on the inside of your forearm.  Hold  this position for __________ seconds. Repeat __________ times. Complete this stretch __________ times per day.  STRENGTHENING EXERCISES - Epicondylitis, Medial (Golfer's Elbow) These exercises may help you when beginning to rehabilitate your injury. They may resolve your symptoms with or without further involvement from your physician, physical therapist or athletic trainer. While completing these exercises, remember:   Muscles can gain both the endurance and the strength needed for everyday activities through controlled exercises.  Complete these exercises as instructed by your physician, physical therapist or athletic trainer. Increase the resistance and repetitions only as guided.  You may experience muscle soreness or fatigue, but the pain or discomfort you are trying to eliminate should never worsen during these exercises. If this pain does get worse, stop and make sure you are following the directions exactly. If the pain is still present after adjustments, discontinue the exercise until you can discuss the trouble with your caregiver. STRENGTH - Wrist Flexors  Sit with your right / left forearm palm-up, and fully supported on a table or countertop. Your elbow should be resting below the height of your shoulder. Allow your wrist to extend over the edge of the surface.  Loosely holding a __________ weight, or a piece of rubber exercise band or tubing, slowly curl your hand up toward your forearm.  Hold this position for __________ seconds. Slowly lower the wrist back to the starting position in a controlled manner. Repeat __________ times. Complete this exercise __________ times per day.  STRENGTH - Wrist Extensors  Sit with your right / left forearm palm-down and fully supported. Your elbow should be resting below the height of your shoulder.   Allow your wrist to extend over the edge of the surface.  Loosely holding a __________ weight, or a piece of rubber exercise band or tubing, slowly  curl your hand up toward your forearm.  Hold this position for __________ seconds. Slowly lower the wrist back to the starting position in a controlled manner. Repeat __________ times. Complete this exercise __________ times per day.  STRENGTH - Ulnar Deviators  Stand with a ____________________ weight in your right / left hand, or sit while holding a rubber exercise band or tubing, with your healthy arm supported on a table or countertop.  Move your wrist so that your pinkie travels toward your forearm and your thumb moves away from your forearm.  Hold this position for __________ seconds and then slowly lower the wrist back to the starting position. Repeat __________ times. Complete this exercise __________ times per day STRENGTH - Grip   Grasp a tennis ball, a dense sponge, or a large, rolled sock in your hand.  Squeeze as hard as you can, without increasing any pain.  Hold this position for __________ seconds. Release your grip slowly. Repeat __________ times. Complete this exercise __________ times per day.  STRENGTH - Forearm Supinators   Sit with your right / left forearm supported on a table, keeping your elbow below shoulder height. Rest your hand over the edge, palm down.  Gently grip a hammer or a soup ladle.  Without moving your elbow, slowly turn your palm and hand upward to a "thumbs-up" position.  Hold this position for __________ seconds. Slowly return to the starting position. Repeat __________ times. Complete this exercise __________ times per day.  STRENGTH - Forearm Pronators  Sit with your right / left forearm supported on a table, keeping your elbow below shoulder height. Rest your hand over the edge, palm up.  Gently grip a hammer or a soup ladle.  Without moving your elbow, slowly turn your palm and hand upward to a "thumbs-up" position.  Hold this position for __________ seconds. Slowly return to the starting position. Repeat __________ times. Complete  this exercise __________ times per day.  Document Released: 01/21/2005 Document Revised: 04/15/2011 Document Reviewed: 05/05/2008 [employer] Patient Information 2015 [employer]. This information is not intended to replace advice given to you by your health care provider. Make sure you discuss any questions you have with your health care provider.  

## 2014-02-23 ENCOUNTER — Telehealth: Payer: Self-pay | Admitting: Nurse Practitioner

## 2014-02-23 ENCOUNTER — Encounter: Payer: Self-pay | Admitting: Family Medicine

## 2014-02-23 ENCOUNTER — Ambulatory Visit (INDEPENDENT_AMBULATORY_CARE_PROVIDER_SITE_OTHER): Payer: Medicare Other | Admitting: Family Medicine

## 2014-02-23 ENCOUNTER — Ambulatory Visit (INDEPENDENT_AMBULATORY_CARE_PROVIDER_SITE_OTHER): Payer: Medicare Other

## 2014-02-23 VITALS — BP 81/46 | HR 50 | Temp 96.6°F

## 2014-02-23 DIAGNOSIS — M25521 Pain in right elbow: Secondary | ICD-10-CM | POA: Diagnosis not present

## 2014-02-23 DIAGNOSIS — R001 Bradycardia, unspecified: Secondary | ICD-10-CM | POA: Diagnosis not present

## 2014-02-23 NOTE — Telephone Encounter (Signed)
Pt given appt today with Bill Oxford at 2:30.

## 2014-02-23 NOTE — Progress Notes (Signed)
Subjective:    Patient ID: Tamara Mccullough, female    DOB: 09-26-1955, 59 y.o.   MRN: [id number]  HPI Patient has right medial olecranon bursitis and right 3rd, 4th, 5th finger numbness.  She states she saw her neurosurgeon to follow up with her DDD of the cervical spine and she states she was told that the numbness was from the right elbow. She had an injection of her right elbow and she states she has been eating a lot. She has low heart rate and she states she is not tired and gets around and does ADL's with SOB or fatigue.    Review of Systems  Constitutional: Negative for fever.  HENT: Negative for ear pain.   Eyes: Negative for discharge.  Respiratory: Negative for cough.   Cardiovascular: Negative for chest pain.  Gastrointestinal: Negative for abdominal distention.  Endocrine: Negative for polyuria.  Genitourinary: Negative for difficulty urinating.  Musculoskeletal: Negative for gait problem and neck pain.  Skin: Negative for color change and rash.  Neurological: Negative for speech difficulty and headaches.  Psychiatric/Behavioral: Negative for agitation.       Objective:    BP 81/46 mmHg  Pulse 50  Temp(Src) 96.6 F (35.9 C) (Oral)  Wt  Physical Exam MS Right elbow - Medial epicondyle tenderness Right hand - Weakness in 3rd, 4th, and 5th fingers and numbness.  Neg Phalens or Tinnels. Equal strength bilateral arms.  Right grips are weak.   Xray right elbow - no fracture Prelimnary reading by Chrissie Noa Rector [place]onshire,FNP  EKG - SB rate 48.     Assessment & Plan:     ICD-9-CM ICD-10-CM   1. Right elbow pain 719.42 M25.521 DG Elbow 2 Views Right     EKG 12-Lead   2. Bradycardia - Discussed she may need cardiology consult and need 24 hour holter monitor.  She wants to wait for now.  Discussed F/u ASAP with SOB or fatigue or go to ED if worse.   Return if symptoms worsen or fail to improve.  Deatra Canter FNP

## 2014-03-01 ENCOUNTER — Telehealth: Payer: Self-pay | Admitting: Nurse Practitioner

## 2014-03-01 DIAGNOSIS — M7702 Medial epicondylitis, left elbow: Secondary | ICD-10-CM

## 2014-03-01 NOTE — Telephone Encounter (Signed)
Need to know what needs PT for.

## 2014-03-01 NOTE — Telephone Encounter (Signed)
Patient has been doing exercises as directed and she says that they are not helping and wants to know if she can be referred for physical therapy next door.

## 2014-03-01 NOTE — Telephone Encounter (Signed)
Referral made for PT

## 2014-03-01 NOTE — Telephone Encounter (Signed)
For Golfers elbow

## 2014-03-02 NOTE — Telephone Encounter (Signed)
Patient aware that referral has been made.  

## 2014-03-07 ENCOUNTER — Ambulatory Visit (INDEPENDENT_AMBULATORY_CARE_PROVIDER_SITE_OTHER): Payer: Medicare Other | Admitting: Nurse Practitioner

## 2014-03-07 ENCOUNTER — Encounter: Payer: Self-pay | Admitting: Nurse Practitioner

## 2014-03-07 VITALS — BP 147/80 | HR 65 | Temp 98.5°F | Ht 62.0 in | Wt 244.0 lb

## 2014-03-07 DIAGNOSIS — Z0000 Encounter for general adult medical examination without abnormal findings: Secondary | ICD-10-CM

## 2014-03-07 DIAGNOSIS — G2581 Restless legs syndrome: Secondary | ICD-10-CM | POA: Diagnosis not present

## 2014-03-07 DIAGNOSIS — F5105 Insomnia due to other mental disorder: Secondary | ICD-10-CM | POA: Diagnosis not present

## 2014-03-07 DIAGNOSIS — F409 Phobic anxiety disorder, unspecified: Secondary | ICD-10-CM

## 2014-03-07 DIAGNOSIS — I10 Essential (primary) hypertension: Secondary | ICD-10-CM | POA: Diagnosis not present

## 2014-03-07 DIAGNOSIS — Z01419 Encounter for gynecological examination (general) (routine) without abnormal findings: Secondary | ICD-10-CM | POA: Diagnosis not present

## 2014-03-07 DIAGNOSIS — F331 Major depressive disorder, recurrent, moderate: Secondary | ICD-10-CM

## 2014-03-07 DIAGNOSIS — E785 Hyperlipidemia, unspecified: Secondary | ICD-10-CM

## 2014-03-07 DIAGNOSIS — F411 Generalized anxiety disorder: Secondary | ICD-10-CM | POA: Diagnosis not present

## 2014-03-07 LAB — POCT URINALYSIS DIPSTICK
Bilirubin, UA: NEGATIVE
Glucose, UA: NEGATIVE
Ketones, UA: NEGATIVE
Leukocytes, UA: NEGATIVE
Nitrite, UA: NEGATIVE
PH UA: 6
RBC UA: NEGATIVE
Spec Grav, UA: >=1.03
Urobilinogen, UA: NEGATIVE

## 2014-03-07 LAB — POCT UA - MICROSCOPIC ONLY
Bacteria, U Microscopic: NEGATIVE
CASTS, UR, LPF, POC: NEGATIVE
Crystals, Ur, HPF, POC: NEGATIVE
RBC, urine, microscopic: NEGATIVE
YEAST UA: NEGATIVE

## 2014-03-07 MED ORDER — PRAMIPEXOLE DIHYDROCHLORIDE 0.5 MG PO TABS: 0.5000 mg | ORAL_TABLET | Freq: Every day | ORAL | Status: DC

## 2014-03-07 MED ORDER — ROSUVASTATIN CALCIUM 10 MG PO TABS: 10.0000 mg | ORAL_TABLET | Freq: Every day | ORAL | Status: DC

## 2014-03-07 NOTE — Patient Instructions (Signed)

## 2014-03-07 NOTE — Progress Notes (Signed)
Subjective:    Patient ID: Tamara Mccullough, female    DOB: 09/11/1955, 58 y.o.   MRN: [id number]\  Patient is here today for CPE with PAP.  No acute complaint other then still having right elbow pain with numbness in 5 th digit. Ordered pt   Hypertension This is a chronic problem. The current episode started more than 1 year ago. Associated symptoms include anxiety. Pertinent negatives include no chest pain or shortness of breath. Neck pain: chronic. Risk factors for coronary artery disease include post-menopausal state. Past treatments include diuretics and ACE inhibitors.  Anxiety Presents for follow-up visit. Onset was 1 to 6 months ago. Patient reports no chest pain, shortness of breath or suicidal ideas.    Hyperlipidemia This is a chronic problem. The current episode started more than 1 year ago. Pertinent negatives include no chest pain or shortness of breath. There are no compliance problems.  Risk factors for coronary artery disease include hypertension, post-menopausal and obesity.  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 RLS Ptatient on pramipexole- helps some  *pt reports tremors on her right 4th and 5th finger. She has been seen for this complaint. A referral has been made. She is awaiting appointment.   Review of Systems  HENT: Negative.   Eyes: Negative.   Respiratory: Negative.  Negative for shortness of breath.   Cardiovascular: Negative for chest pain.  Genitourinary: Negative.   Musculoskeletal: Negative.  Neck pain: chronic.  Skin: Negative.   Allergic/Immunologic: Negative.   Neurological: Positive for tremors (Tremors on the 4th and 5th fingers.) and numbness.  Psychiatric/Behavioral: Negative.  Negative for suicidal ideas.  All other systems reviewed and are negative.      Objective:   Physical Exam  Constitutional: She is oriented to person, place, and time. She appears well-developed and well-nourished.  HENT:  Head: Normocephalic.   Right Ear: Hearing, tympanic membrane, external ear and ear canal normal.  Left Ear: Hearing, tympanic membrane, external ear and ear canal normal.  Nose: Nose normal.  Mouth/Throat: Uvula is midline, oropharynx is clear and moist and mucous membranes are normal.  Eyes: Conjunctivae and EOM are normal. Pupils are equal, round, and reactive to light.  Neck: Normal range of motion. Neck supple. No JVD present. No thyromegaly present.  Cardiovascular: Normal rate, normal heart sounds and intact distal pulses.   No murmur heard. Pulmonary/Chest: Effort normal and breath sounds normal. She has no wheezes. She has no rales.  Abdominal: Soft. Bowel sounds are normal. She exhibits no mass.  Musculoskeletal: Normal range of motion.  Neurological: She is alert and oriented to person, place, and time. She has normal reflexes.  Skin: Skin is warm and dry.  Psychiatric: She has a normal mood and affect. Her behavior is normal. Judgment and thought content normal.    BP 147/80 mmHg  Pulse 65  Temp(Src) 98.5 F (36.9 C) (Oral)  Ht 5' 2" (1.575 m)  Wt 244 lb (110.678 kg)  BMI 44.62 kg/m2        Assessment & Plan:   1. Annual physical exam - POCT urinalysis dipstick - POCT UA - Microscopic Only - Thyroid Panel With TSH - Vitamin B12  2. Essential hypertension, benign Low sodium diet - CMP14+EGFR  3. GAD (generalized anxiety disorder) Continue prescribed medication  4. Major depressive disorder, recurrent episode, moderate Stress relief encouraged Continue prescribe medicine  5. Insomnia due to anxiety and fear Continue Ambien as prescribe.   6. RLS (restless legs syndrome) - pramipexole (  MIRAPEX) 0.5 MG tablet; Take 1 tablet (0.5 mg total) by mouth at bedtime.  Dispense: 90 tablet; Refill: 1  7. Encounter for routine gynecological examination - Pap IG w/ reflex to HPV when ASC-U  8. Hyperlipidemia with target LDL less than 100 -Low fat diet.  - NMR, lipoprofile -  rosuvastatin (CRESTOR) 10 MG tablet; Take 1 tablet (10 mg total) by mouth daily.  Dispense: 90 tablet; Refill: 1   Patient stated she will not do mammogram Labs pending Health maintenance reviewed Diet and exercise encouraged Continue all meds Follow up  In [place], FNP

## 2014-03-08 ENCOUNTER — Other Ambulatory Visit: Payer: Medicare Other | Admitting: Nurse Practitioner

## 2014-03-08 LAB — NMR, LIPOPROFILE
Cholesterol: 155 mg/dL (ref 100–199)
HDL Cholesterol by NMR: 64 mg/dL (ref >39)
HDL Particle Number: 42.7 umol/L (ref >=30.5)
LDL Particle Number: 969 nmol/L (ref <1000)
LDL Size: 21.1 nm (ref >20.5)
LDL-C: 74 mg/dL (ref 0–99)
LP-IR SCORE: 41 (ref <=45)
Small LDL Particle Number: 514 nmol/L (ref <=527)
Triglycerides by NMR: 87 mg/dL (ref 0–149)

## 2014-03-08 LAB — CMP14+EGFR
ALBUMIN: 4 g/dL (ref 3.6–4.8)
ALT: 42 IU/L — ABNORMAL HIGH (ref 0–32)
AST: 66 IU/L — AB (ref 0–40)
Albumin/Globulin Ratio: 1.8 (ref 1.1–2.5)
Alkaline Phosphatase: 117 IU/L (ref 39–117)
BUN/Creatinine Ratio: 16 (ref 11–26)
BUN: 14 mg/dL (ref 8–27)
CO2: 24 mmol/L (ref 18–29)
CREATININE: 0.89 mg/dL (ref 0.57–1.00)
Calcium: 8.3 mg/dL — ABNORMAL LOW (ref 8.7–10.3)
Chloride: 103 mmol/L (ref 97–108)
GFR calc Af Amer: 81 mL/min/{1.73_m2} (ref >59)
GFR calc non Af Amer: 71 mL/min/{1.73_m2} (ref >59)
Globulin, Total: 2.2 g/dL (ref 1.5–4.5)
Glucose: 116 mg/dL — ABNORMAL HIGH (ref 65–99)
Potassium: 4.7 mmol/L (ref 3.5–5.2)
SODIUM: 142 mmol/L (ref 134–144)
Total Bilirubin: 0.2 mg/dL (ref 0.0–1.2)
Total Protein: 6.2 g/dL (ref 6.0–8.5)

## 2014-03-08 LAB — THYROID PANEL WITH TSH
FREE THYROXINE INDEX: 2.3 (ref 1.2–4.9)
T3 UPTAKE RATIO: 27 % (ref 24–39)
T4 TOTAL: 8.4 ug/dL (ref 4.5–12.0)
TSH: 6.43 u[IU]/mL — ABNORMAL HIGH (ref 0.450–4.500)

## 2014-03-08 LAB — VITAMIN B12

## 2014-03-09 LAB — PAP IG W/ RFLX HPV ASCU: PAP SMEAR COMMENT: 0

## 2014-03-11 ENCOUNTER — Other Ambulatory Visit: Payer: Self-pay | Admitting: *Deleted

## 2014-03-11 ENCOUNTER — Telehealth: Payer: Self-pay | Admitting: Nurse Practitioner

## 2014-03-11 DIAGNOSIS — R7989 Other specified abnormal findings of blood chemistry: Secondary | ICD-10-CM

## 2014-03-11 NOTE — Telephone Encounter (Signed)
Message given to medical records to fax

## 2014-03-18 DIAGNOSIS — G5602 Carpal tunnel syndrome, left upper limb: Secondary | ICD-10-CM | POA: Diagnosis not present

## 2014-03-18 DIAGNOSIS — G5621 Lesion of ulnar nerve, right upper limb: Secondary | ICD-10-CM | POA: Diagnosis not present

## 2014-03-18 DIAGNOSIS — M4802 Spinal stenosis, cervical region: Secondary | ICD-10-CM | POA: Diagnosis not present

## 2014-04-04 DIAGNOSIS — Z6838 Body mass index (BMI) 38.0-38.9, adult: Secondary | ICD-10-CM | POA: Diagnosis not present

## 2014-04-04 DIAGNOSIS — I10 Essential (primary) hypertension: Secondary | ICD-10-CM | POA: Diagnosis not present

## 2014-04-04 DIAGNOSIS — G5621 Lesion of ulnar nerve, right upper limb: Secondary | ICD-10-CM | POA: Diagnosis not present

## 2014-04-04 DIAGNOSIS — Z6833 Body mass index (BMI) 33.0-33.9, adult: Secondary | ICD-10-CM | POA: Diagnosis not present

## 2014-04-08 DIAGNOSIS — G5621 Lesion of ulnar nerve, right upper limb: Secondary | ICD-10-CM | POA: Diagnosis not present

## 2014-06-06 ENCOUNTER — Ambulatory Visit: Payer: Medicare Other | Admitting: Nurse Practitioner

## 2014-06-14 DIAGNOSIS — F3181 Bipolar II disorder: Secondary | ICD-10-CM | POA: Diagnosis not present

## 2014-06-22 ENCOUNTER — Ambulatory Visit (INDEPENDENT_AMBULATORY_CARE_PROVIDER_SITE_OTHER): Payer: Medicare Other | Admitting: Nurse Practitioner

## 2014-06-22 ENCOUNTER — Encounter: Payer: Self-pay | Admitting: Nurse Practitioner

## 2014-06-22 VITALS — BP 134/82 | HR 74 | Temp 96.9°F | Ht 62.0 in | Wt 245.0 lb

## 2014-06-22 DIAGNOSIS — E785 Hyperlipidemia, unspecified: Secondary | ICD-10-CM

## 2014-06-22 DIAGNOSIS — F409 Phobic anxiety disorder, unspecified: Secondary | ICD-10-CM | POA: Diagnosis not present

## 2014-06-22 DIAGNOSIS — F1020 Alcohol dependence, uncomplicated: Secondary | ICD-10-CM

## 2014-06-22 DIAGNOSIS — F5105 Insomnia due to other mental disorder: Secondary | ICD-10-CM

## 2014-06-22 DIAGNOSIS — G2581 Restless legs syndrome: Secondary | ICD-10-CM

## 2014-06-22 DIAGNOSIS — I10 Essential (primary) hypertension: Secondary | ICD-10-CM

## 2014-06-22 DIAGNOSIS — F411 Generalized anxiety disorder: Secondary | ICD-10-CM | POA: Diagnosis not present

## 2014-06-22 DIAGNOSIS — E669 Obesity, unspecified: Secondary | ICD-10-CM

## 2014-06-22 DIAGNOSIS — F3177 Bipolar disorder, in partial remission, most recent episode mixed: Secondary | ICD-10-CM

## 2014-06-22 DIAGNOSIS — D519 Vitamin B12 deficiency anemia, unspecified: Secondary | ICD-10-CM | POA: Diagnosis not present

## 2014-06-22 MED ORDER — ROSUVASTATIN CALCIUM 10 MG PO TABS: 10.0000 mg | ORAL_TABLET | Freq: Every day | ORAL | Status: DC

## 2014-06-22 MED ORDER — PRAMIPEXOLE DIHYDROCHLORIDE 0.5 MG PO TABS: 0.5000 mg | ORAL_TABLET | Freq: Every day | ORAL | Status: DC

## 2014-06-22 MED ORDER — LISINOPRIL-HYDROCHLOROTHIAZIDE 10-12.5 MG PO TABS: 1.0000 | ORAL_TABLET | Freq: Every morning | ORAL | Status: DC

## 2014-06-22 NOTE — Progress Notes (Signed)
Subjective:    Patient ID: Tamara Mccullough, female    DOB: [birth year], 59 y.o.   MRN: [id number]\  Patient here today for follow up of chronic medical problems. No complaints today.  Hypertension This is a chronic problem. The current episode started more than 1 year ago. Neck pain: chronic. Risk factors for coronary artery disease include post-menopausal state. Past treatments include diuretics and ACE inhibitors.  Hyperlipidemia This is a chronic problem. The current episode started more than 1 year ago. There are no compliance problems.  Risk factors for coronary artery disease include hypertension, post-menopausal and obesity.  alcohlism Patient has not drank since 2014 RLS Ptatient on pramipexole- helps some b12 def No longer getting shots - b12 has been normal lately so patient stopped taking GAD Currently taking buspar- and still going to counseling insomnia Still on ambien nightly Bipolar curently on lamictal which is working well without side effects    Review of Systems  HENT: Negative.   Eyes: Negative.   Respiratory: Negative.   Genitourinary: Negative.   Musculoskeletal: Negative.  Neck pain: chronic.  Skin: Negative.   Allergic/Immunologic: Negative.   Neurological: Positive for tremors (Tremors on the 4th and 5th fingers.) and numbness.  Psychiatric/Behavioral: Negative.   All other systems reviewed and are negative.      Objective:   Physical Exam  Constitutional: She is oriented to person, place, and time. She appears well-developed and well-nourished.  HENT:  Head: Normocephalic.  Right Ear: Hearing, tympanic membrane, external ear and ear canal normal.  Left Ear: Hearing, tympanic membrane, external ear and ear canal normal.  Nose: Nose normal.  Mouth/Throat: Uvula is midline, oropharynx is clear and moist and mucous membranes are normal.  Eyes: Conjunctivae and EOM are normal. Pupils are equal, round, and reactive to light.  Neck: Normal range of  motion. Neck supple. No JVD present. No thyromegaly present.  Cardiovascular: Normal rate, normal heart sounds and intact distal pulses.   No murmur heard. Pulmonary/Chest: Effort normal and breath sounds normal. She has no wheezes. She has no rales.  Abdominal: Soft. Bowel sounds are normal. She exhibits no mass.  Musculoskeletal: Normal range of motion.  Neurological: She is alert and oriented to person, place, and time. She has normal reflexes.  Skin: Skin is warm and dry.  Psychiatric: She has a normal mood and affect. Her behavior is normal. Judgment and thought content normal.    BP 134/82 mmHg  Pulse 74  Temp(Src) 96.9 F (36.1 C) (Oral)  Ht _0  (1.575 m)  Wt 245 lb (111.131 kg)  BMI 44.80 kg/m2        Assessment & Plan:   1. Essential hypertension, benign Do not add salt to diet - lisinopril-hydrochlorothiazide (PRINZIDE,ZESTORETIC) 10-12.5 MG per tablet; Take 1 tablet by mouth every morning.  Dispense: 90 tablet; Refill: 1 - CMP14+EGFR  2. RLS (restless legs syndrome) Keeps legs wram at night - pramipexole (MIRAPEX) 0.5 MG tablet; Take 1 tablet (0.5 mg total) by mouth at bedtime.  Dispense: 90 tablet; Refill: 1  3. Obesity Discussed diet and exercise for person with BMI >25 Will recheck weight in 3-6 months   4. Insomnia due to anxiety and fear Bedtime ritual  5. Hyperlipidemia with target LDL less than 100 Low fat diet - rosuvastatin (CRESTOR) 10 MG tablet; Take 1 tablet (10 mg total) by mouth daily.  Dispense: 90 tablet; Refill: 1 - NMR, lipoprofile  6. GAD (generalized anxiety disorder) Stress management Continue to see counselor  7. Bipolar disorder, in partial remission, most recent episode mixed cpontinue with psych  8. Anemia, vitamin B12 deficiency - Anemia Profile B  9. Uncomplicated alcohol dependence Continue to avoid alcohol    Labs pending Health maintenance reviewed Diet and exercise encouraged Continue all meds Follow up  In  3 month   [place], FNP

## 2014-06-22 NOTE — Patient Instructions (Signed)
Exercise to Stay Healthy Exercise helps you become and stay healthy. EXERCISE IDEAS AND TIPS Choose exercises that:  You enjoy.  Fit into your day. You do not need to exercise really hard to be healthy. You can do exercises at a slow or medium level and stay healthy. You can:  Stretch before and after working out.  Try yoga, Pilates, or tai chi.  Lift weights.  Walk fast, swim, jog, run, climb stairs, bicycle, dance, or rollerskate.  Take aerobic classes. Exercises that burn about 150 calories:  Running 1  miles in 15 minutes.  Playing volleyball for 45 to 60 minutes.  Washing and waxing a car for 45 to 60 minutes.  Playing touch football for 45 minutes.  Walking 1  miles in 35 minutes.  Pushing a stroller 1  miles in 30 minutes.  Playing basketball for 30 minutes.  Raking leaves for 30 minutes.  Bicycling 5 miles in 30 minutes.  Walking 2 miles in 30 minutes.  Dancing for 30 minutes.  Shoveling snow for 15 minutes.  Swimming laps for 20 minutes.  Walking up stairs for 15 minutes.  Bicycling 4 miles in 15 minutes.  Gardening for 30 to 45 minutes.  Jumping rope for 15 minutes.  Washing windows or floors for 45 to 60 minutes. Document Released: 02/23/2010 Document Revised: 04/15/2011 Document Reviewed: 02/23/2010 [employer] Patient Information 2015 [employer]. This information is not intended to replace advice given to you by your health care provider. Make sure you discuss any questions you have with your health care provider.  

## 2014-06-24 ENCOUNTER — Encounter: Payer: Self-pay | Admitting: Gastroenterology

## 2014-06-29 ENCOUNTER — Other Ambulatory Visit (INDEPENDENT_AMBULATORY_CARE_PROVIDER_SITE_OTHER): Payer: Medicare Other

## 2014-06-29 DIAGNOSIS — I10 Essential (primary) hypertension: Secondary | ICD-10-CM | POA: Diagnosis not present

## 2014-06-29 DIAGNOSIS — D519 Vitamin B12 deficiency anemia, unspecified: Secondary | ICD-10-CM | POA: Diagnosis not present

## 2014-06-29 DIAGNOSIS — E785 Hyperlipidemia, unspecified: Secondary | ICD-10-CM | POA: Diagnosis not present

## 2014-06-29 NOTE — Progress Notes (Signed)
Lab work for 06-22-2014

## 2014-06-30 LAB — ANEMIA PROFILE B
BASOS: 1 %
Basophils Absolute: 0 10*3/uL (ref 0.0–0.2)
EOS (ABSOLUTE): 0.1 10*3/uL (ref 0.0–0.4)
Eos: 1 %
Ferritin: 9 ng/mL — ABNORMAL LOW (ref 15–150)
Folate: 13.2 ng/mL (ref >3.0)
Hematocrit: 33.1 % — ABNORMAL LOW (ref 34.0–46.6)
Hemoglobin: 10.2 g/dL — ABNORMAL LOW (ref 11.1–15.9)
IMMATURE GRANS (ABS): 0 10*3/uL (ref 0.0–0.1)
Immature Granulocytes: 0 %
Iron Saturation: 7 % — CL (ref 15–55)
Iron: 29 ug/dL (ref 27–159)
Lymphocytes Absolute: 2 10*3/uL (ref 0.7–3.1)
Lymphs: 35 %
MCH: 23 pg — ABNORMAL LOW (ref 26.6–33.0)
MCHC: 30.8 g/dL — ABNORMAL LOW (ref 31.5–35.7)
MCV: 75 fL — AB (ref 79–97)
MONOS ABS: 0.4 10*3/uL (ref 0.1–0.9)
Monocytes: 7 %
Neutrophils Absolute: 3.2 10*3/uL (ref 1.4–7.0)
Neutrophils: 56 %
PLATELETS: 262 10*3/uL (ref 150–379)
RBC: 4.44 x10E6/uL (ref 3.77–5.28)
RDW: 17.1 % — ABNORMAL HIGH (ref 12.3–15.4)
RETIC CT PCT: 1.5 % (ref 0.6–2.6)
Total Iron Binding Capacity: 401 ug/dL (ref 250–450)
UIBC: 372 ug/dL (ref 131–425)
WBC: 5.7 10*3/uL (ref 3.4–10.8)

## 2014-06-30 LAB — CMP14+EGFR
ALT: 15 IU/L (ref 0–32)
AST: 20 IU/L (ref 0–40)
Albumin/Globulin Ratio: 2.1 (ref 1.1–2.5)
Albumin: 4.4 g/dL (ref 3.6–4.8)
Alkaline Phosphatase: 85 IU/L (ref 39–117)
BUN/Creatinine Ratio: 17 (ref 11–26)
BUN: 15 mg/dL (ref 8–27)
Bilirubin Total: 0.5 mg/dL (ref 0.0–1.2)
CALCIUM: 8.3 mg/dL — AB (ref 8.7–10.3)
CHLORIDE: 99 mmol/L (ref 97–108)
CO2: 21 mmol/L (ref 18–29)
CREATININE: 0.86 mg/dL (ref 0.57–1.00)
GFR calc non Af Amer: 74 mL/min/{1.73_m2} (ref >59)
GFR, EST AFRICAN AMERICAN: 85 mL/min/{1.73_m2} (ref >59)
GLUCOSE: 111 mg/dL — AB (ref 65–99)
Globulin, Total: 2.1 g/dL (ref 1.5–4.5)
POTASSIUM: 4.1 mmol/L (ref 3.5–5.2)
Sodium: 137 mmol/L (ref 134–144)
TOTAL PROTEIN: 6.5 g/dL (ref 6.0–8.5)

## 2014-06-30 LAB — NMR, LIPOPROFILE
Cholesterol: 158 mg/dL (ref 100–199)
HDL Cholesterol by NMR: 67 mg/dL (ref >39)
HDL Particle Number: 45.1 umol/L (ref >=30.5)
LDL Particle Number: 991 nmol/L (ref <1000)
LDL SIZE: 21.1 nm (ref >20.5)
LDL-C: 73 mg/dL (ref 0–99)
SMALL LDL PARTICLE NUMBER: 573 nmol/L — AB (ref <=527)
TRIGLYCERIDES BY NMR: 88 mg/dL (ref 0–149)

## 2014-07-13 ENCOUNTER — Telehealth: Payer: Self-pay | Admitting: Family Medicine

## 2014-07-13 DIAGNOSIS — D519 Vitamin B12 deficiency anemia, unspecified: Secondary | ICD-10-CM

## 2014-07-13 DIAGNOSIS — R739 Hyperglycemia, unspecified: Secondary | ICD-10-CM

## 2014-07-13 NOTE — Telephone Encounter (Signed)
Please review labs from last month and address iron. Please route to pool B

## 2014-07-13 NOTE — Telephone Encounter (Signed)
Patient notified of lab results and instructed to take Iron and follow up with just labwork in 2 weeks to recheck iron.Patient also would like HgBA1C checked due to history of blood sugar problems. Orders placed

## 2014-07-13 NOTE — Telephone Encounter (Signed)
Please call pt with lab results on 06/29/14- PT needs to be on ferrous sulfate 325 mcg- 2 tabs BID and have follow up

## 2014-07-14 ENCOUNTER — Ambulatory Visit: Payer: Medicare Other | Admitting: Family

## 2014-07-15 ENCOUNTER — Encounter: Payer: Self-pay | Admitting: Nurse Practitioner

## 2014-09-06 DIAGNOSIS — L03032 Cellulitis of left toe: Secondary | ICD-10-CM | POA: Diagnosis not present

## 2014-09-12 ENCOUNTER — Telehealth: Payer: Self-pay | Admitting: Nurse Practitioner

## 2014-09-12 NOTE — Telephone Encounter (Signed)
Patient called stating she came in two weeks ago for labs and wanted her results.   Informed patient i did not see any recent lab work in her chart and lab did not have any records of her coming in to have blood work done.  Patient will come in to have blood redrawn.

## 2014-09-29 ENCOUNTER — Ambulatory Visit: Payer: Medicare Other | Admitting: Nurse Practitioner

## 2014-09-30 ENCOUNTER — Encounter: Payer: Self-pay | Admitting: Family Medicine

## 2014-09-30 ENCOUNTER — Ambulatory Visit (INDEPENDENT_AMBULATORY_CARE_PROVIDER_SITE_OTHER): Payer: Medicare Other | Admitting: Family Medicine

## 2014-09-30 ENCOUNTER — Ambulatory Visit: Payer: Medicare Other | Admitting: Podiatry

## 2014-09-30 VITALS — BP 99/62 | HR 70 | Temp 97.3°F | Ht 62.0 in | Wt 240.6 lb

## 2014-09-30 DIAGNOSIS — E042 Nontoxic multinodular goiter: Secondary | ICD-10-CM | POA: Diagnosis not present

## 2014-09-30 DIAGNOSIS — G2581 Restless legs syndrome: Secondary | ICD-10-CM | POA: Diagnosis not present

## 2014-09-30 DIAGNOSIS — D519 Vitamin B12 deficiency anemia, unspecified: Secondary | ICD-10-CM

## 2014-09-30 DIAGNOSIS — R739 Hyperglycemia, unspecified: Secondary | ICD-10-CM | POA: Diagnosis not present

## 2014-09-30 DIAGNOSIS — E785 Hyperlipidemia, unspecified: Secondary | ICD-10-CM | POA: Diagnosis not present

## 2014-09-30 DIAGNOSIS — I10 Essential (primary) hypertension: Secondary | ICD-10-CM | POA: Diagnosis not present

## 2014-09-30 LAB — POCT GLYCOSYLATED HEMOGLOBIN (HGB A1C): Hemoglobin A1C: 5.2

## 2014-09-30 MED ORDER — ROSUVASTATIN CALCIUM 5 MG PO TABS
5.0000 mg | ORAL_TABLET | Freq: Every day | ORAL | Status: DC
Start: 2014-09-30 — End: 2015-03-28

## 2014-09-30 MED ORDER — LISINOPRIL-HYDROCHLOROTHIAZIDE 10-12.5 MG PO TABS: 1.0000 | ORAL_TABLET | Freq: Every morning | ORAL | Status: DC

## 2014-09-30 MED ORDER — ROSUVASTATIN CALCIUM 10 MG PO TABS: 10.0000 mg | ORAL_TABLET | Freq: Every day | ORAL | Status: DC

## 2014-09-30 MED ORDER — PRAMIPEXOLE DIHYDROCHLORIDE 0.5 MG PO TABS: 0.5000 mg | ORAL_TABLET | Freq: Every day | ORAL | Status: DC

## 2014-09-30 NOTE — Progress Notes (Signed)
Subjective:  Patient ID: Tamara Mccullough, female    DOB: 1955/07/04  Age: 59 y.o. MRN: [id number]  CC: Anemia; Hyperlipidemia; GAD; and Hypertension   HPI Tamara Mccullough presents for  follow-up of hypertension. Patient has no history of headache chest pain or shortness of breath or recent cough. Patient also denies symptoms of TIA such as numbness weakness lateralizing. Patient checks  blood pressure at home and has not had any elevated readings recently. Patient denies side effects from his medication. States taking it regularly.  Patient also  in for follow-up of elevated cholesterol. Doing well without complaints on current medication. Denies side effects of statin as long as she keeps her dose of Crestor at 5 mg (1/2 of a 10) including myalgia and arthralgia and nausea. Also in today for liver function testing. Currently no chest pain, shortness of breath or other cardiovascular related symptoms noted.  Pt. 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 Off DM meds now for 4 years. Glucose runs around 100 at home. Never over 110.  Disabled since 1998 for back pain, depression and morbid obesity. Dr. Joya Salm,  tramadol. Dr. Lawernce Keas psychiatric meds    History Tamara Mccullough has a past medical history of Anemia; Depression; Depression; Morbid obesity; Edema; Hypertension; Chronic back pain; Peripheral neuropathy; Hyperlipidemia; Panic attacks; Insomnia; Restless leg syndrome; PONV (postoperative nausea and vomiting); Sleep apnea; Asthma; Arthritis; and Seizures.  . She has past surgical history that includes gastric by pass (6/11); Tubal ligation; Back surgery; Thyroid lobectomy (Right, 08/12/2013); Minor hemorrhoidectomy; CTR; and Anterior cervical decomp/discectomy fusion (N/A, 11/09/2013).   Her family history includes Alcohol abuse in her brother, brother, brother, father, paternal grandfather, and sister; Cancer in her mother; Congestive Heart Failure in her father and mother; Diabetes  in her mother; Emphysema in her father.She reports that she quit smoking about 18 years ago. She has never used smokeless tobacco. She reports that she drinks about 7.2 oz of alcohol per week. She reports that she uses illicit drugs (Benzodiazepines).  Current Outpatient Prescriptions on File Prior to Visit  Medication Sig Dispense Refill  . busPIRone (BUSPAR) 10 MG tablet Take 10 mg by mouth 3 (three) times daily.    . diclofenac sodium (VOLTAREN) 1 % GEL Apply 2 g topically 3 (three) times daily.    Marland Kitchen FLUoxetine (PROZAC) 20 MG tablet Take 60 mg by mouth every morning.     . hydrOXYzine (ATARAX/VISTARIL) 10 MG tablet Take 10 mg by mouth 3 (three) times daily.    Marland Kitchen lamoTRIgine (LAMICTAL) 150 MG tablet Take 150 mg by mouth every morning.    . zolpidem (AMBIEN) 10 MG tablet Take 10 mg by mouth at bedtime as needed for sleep.     No current facility-administered medications on file prior to visit.    ROS Review of Systems  Constitutional: Negative for fever, chills, diaphoresis, appetite change, fatigue and unexpected weight change.  HENT: Negative for congestion, ear pain, hearing loss, postnasal drip, rhinorrhea, sneezing, sore throat and trouble swallowing.   Eyes: Negative for pain.  Respiratory: Negative for cough, chest tightness and shortness of breath.   Cardiovascular: Negative for chest pain and palpitations.  Gastrointestinal: Negative for nausea, vomiting, abdominal pain, diarrhea and constipation.  Genitourinary: Negative for dysuria, frequency and menstrual problem.  Musculoskeletal: Positive for myalgias, back pain, arthralgias and neck pain.  Skin: Negative for rash.  Neurological: Negative for dizziness, weakness, numbness and headaches.  Psychiatric/Behavioral: Negative for dysphoric mood and agitation.  Objective:  BP 99/62 mmHg  Pulse 70  Temp(Src) 97.3 F (36.3 C) (Oral)  Ht _0  (1.575 m)  Wt 240 lb 9.6 oz (109.135 kg)  BMI 43.99 kg/m2  BP Readings from  Last 3 Encounters:  09/30/14 99/62  06/22/14 134/82  03/07/14 147/80    Wt Readings from Last 3 Encounters:  09/30/14 240 lb 9.6 oz (109.135 kg)  06/22/14 245 lb (111.131 kg)  03/07/14 244 lb (110.678 kg)     Physical Exam  Constitutional: She is oriented to person, place, and time. She appears well-developed and well-nourished. No distress.  HENT:  Head: Normocephalic and atraumatic.  Right Ear: External ear normal.  Left Ear: External ear normal.  Nose: Nose normal.  Mouth/Throat: Oropharynx is clear and moist.  Eyes: Conjunctivae and EOM are normal. Pupils are equal, round, and reactive to light.  Neck: Normal range of motion. Neck supple. Thyromegaly (Small right-sided goiter) present.  Cardiovascular: Normal rate, regular rhythm and normal heart sounds.   No murmur heard. Pulmonary/Chest: Effort normal and breath sounds normal. No respiratory distress. She has no wheezes. She has no rales.  Abdominal: Soft. Bowel sounds are normal. She exhibits no distension. There is no tenderness.  Musculoskeletal: Normal range of motion. She exhibits no edema.  Lymphadenopathy:    She has no cervical adenopathy.  Neurological: She is alert and oriented to person, place, and time. She has normal reflexes.  Skin: Skin is warm and dry.  Psychiatric: She has a normal mood and affect. Her behavior is normal. Judgment and thought content normal.    Lab Results  Component Value Date   HGBA1C 5.2 09/30/2014   HGBA1C 5.5% 08/31/2012    Lab Results  Component Value Date   WBC 6.9 09/30/2014   HGB 12.0 11/01/2013   HCT 42.4 09/30/2014   PLT 232 11/01/2013   GLUCOSE 113* 09/30/2014   CHOL 155 09/30/2014   TRIG 93 09/30/2014   HDL 74 09/30/2014   LDLCALC 62 09/30/2014   ALT 18 09/30/2014   AST 18 09/30/2014   NA 140 09/30/2014   K 4.6 09/30/2014   CL 100 09/30/2014   CREATININE 1.17* 09/30/2014   BUN 27 09/30/2014   CO2 19 09/30/2014   TSH 6.430* 03/07/2014   INR 0.97  07/28/2013   HGBA1C 5.2 09/30/2014    No results found.  Assessment & Plan:   Tamara Mccullough was seen today for anemia, hyperlipidemia, gad and hypertension.  Diagnoses and all orders for this visit:  Essential hypertension, benign -     CMP14+EGFR -     lisinopril-hydrochlorothiazide (PRINZIDE,ZESTORETIC) 10-12.5 MG per tablet; Take 1 tablet by mouth every morning.  Anemia, vitamin B12 deficiency -     Anemia Profile B  Hyperlipidemia with target LDL less than 100 -     CMP14+EGFR -     Lipid panel -     POCT glycosylated hemoglobin (Hb A1C) -     Discontinue: rosuvastatin (CRESTOR) 10 MG tablet; Take 1 tablet (10 mg total) by mouth daily.  Multinodular goiter  Blood glucose elevated -     POCT glycosylated hemoglobin (Hb A1C)  RLS (restless legs syndrome) -     pramipexole (MIRAPEX) 0.5 MG tablet; Take 1 tablet (0.5 mg total) by mouth at bedtime.  Other orders -     rosuvastatin (CRESTOR) 5 MG tablet; Take 1 tablet (5 mg total) by mouth daily.   I have discontinued Ms. Stukes's multivitamin with minerals, nystatin cream, rosuvastatin, diazepam, and rosuvastatin.  I am also having her start on rosuvastatin. Additionally, I am having her maintain her FLUoxetine, lamoTRIgine, hydrOXYzine, busPIRone, diclofenac sodium, zolpidem, traMADol, pramipexole, and lisinopril-hydrochlorothiazide.  Meds ordered this encounter  Medications  . DISCONTD: diazepam (VALIUM) 5 MG tablet    Sig: Take 1 tablet by mouth every 8 (eight) hours as needed.     Refill:  0  . traMADol (ULTRAM) 50 MG tablet    Sig: TAKE 1 TABLET EVERY 6 TO 8 HOURS    Refill:  0  . pramipexole (MIRAPEX) 0.5 MG tablet    Sig: Take 1 tablet (0.5 mg total) by mouth at bedtime.    Dispense:  90 tablet    Refill:  1  . DISCONTD: rosuvastatin (CRESTOR) 10 MG tablet    Sig: Take 1 tablet (10 mg total) by mouth daily.    Dispense:  90 tablet    Refill:  1  . lisinopril-hydrochlorothiazide (PRINZIDE,ZESTORETIC) 10-12.5 MG  per tablet    Sig: Take 1 tablet by mouth every morning.    Dispense:  90 tablet    Refill:  1  . rosuvastatin (CRESTOR) 5 MG tablet    Sig: Take 1 tablet (5 mg total) by mouth daily.    Dispense:  90 tablet    Refill:  3     Follow-up: Return in about 3 months (around 12/31/2014).  Claretta Fraise, M.D.

## 2014-10-01 LAB — ANEMIA PROFILE B
Basophils Absolute: 0 10*3/uL (ref 0.0–0.2)
Basos: 1 %
EOS (ABSOLUTE): 0.2 10*3/uL (ref 0.0–0.4)
EOS: 3 %
Ferritin: 57 ng/mL (ref 15–150)
Folate: 11 ng/mL (ref >3.0)
HEMOGLOBIN: 13.9 g/dL (ref 11.1–15.9)
Hematocrit: 42.4 % (ref 34.0–46.6)
IMMATURE GRANULOCYTES: 0 %
IRON: 159 ug/dL (ref 27–159)
Immature Grans (Abs): 0 10*3/uL (ref 0.0–0.1)
Iron Saturation: 50 % (ref 15–55)
Lymphocytes Absolute: 2.2 10*3/uL (ref 0.7–3.1)
Lymphs: 31 %
MCH: 28.7 pg (ref 26.6–33.0)
MCHC: 32.8 g/dL (ref 31.5–35.7)
MCV: 88 fL (ref 79–97)
Monocytes Absolute: 0.5 10*3/uL (ref 0.1–0.9)
Monocytes: 7 %
NEUTROS ABS: 4 10*3/uL (ref 1.4–7.0)
Neutrophils: 58 %
Platelets: 184 10*3/uL (ref 150–379)
RBC: 4.84 x10E6/uL (ref 3.77–5.28)
RDW: 19.9 % — ABNORMAL HIGH (ref 12.3–15.4)
RETIC CT PCT: 1.4 % (ref 0.6–2.6)
TIBC: 321 ug/dL (ref 250–450)
UIBC: 162 ug/dL (ref 131–425)
WBC: 6.9 10*3/uL (ref 3.4–10.8)

## 2014-10-01 LAB — CMP14+EGFR
A/G RATIO: 2.2 (ref 1.1–2.5)
ALT: 18 IU/L (ref 0–32)
AST: 18 IU/L (ref 0–40)
Albumin: 4.6 g/dL (ref 3.6–4.8)
Alkaline Phosphatase: 91 IU/L (ref 39–117)
BUN / CREAT RATIO: 23 (ref 11–26)
BUN: 27 mg/dL (ref 8–27)
Bilirubin Total: 0.3 mg/dL (ref 0.0–1.2)
CALCIUM: 9.2 mg/dL (ref 8.7–10.3)
CO2: 19 mmol/L (ref 18–29)
Chloride: 100 mmol/L (ref 97–108)
Creatinine, Ser: 1.17 mg/dL — ABNORMAL HIGH (ref 0.57–1.00)
GFR calc Af Amer: 59 mL/min/{1.73_m2} — ABNORMAL LOW (ref >59)
GFR, EST NON AFRICAN AMERICAN: 51 mL/min/{1.73_m2} — AB (ref >59)
GLOBULIN, TOTAL: 2.1 g/dL (ref 1.5–4.5)
Glucose: 113 mg/dL — ABNORMAL HIGH (ref 65–99)
Potassium: 4.6 mmol/L (ref 3.5–5.2)
SODIUM: 140 mmol/L (ref 134–144)
TOTAL PROTEIN: 6.7 g/dL (ref 6.0–8.5)

## 2014-10-01 LAB — LIPID PANEL
CHOL/HDL RATIO: 2.1 ratio (ref 0.0–4.4)
Cholesterol, Total: 155 mg/dL (ref 100–199)
HDL: 74 mg/dL (ref >39)
LDL Calculated: 62 mg/dL (ref 0–99)
Triglycerides: 93 mg/dL (ref 0–149)
VLDL Cholesterol Cal: 19 mg/dL (ref 5–40)

## 2014-10-31 ENCOUNTER — Ambulatory Visit (INDEPENDENT_AMBULATORY_CARE_PROVIDER_SITE_OTHER): Payer: Medicare Other | Admitting: Family Medicine

## 2014-10-31 ENCOUNTER — Encounter: Payer: Self-pay | Admitting: Family Medicine

## 2014-10-31 ENCOUNTER — Ambulatory Visit (INDEPENDENT_AMBULATORY_CARE_PROVIDER_SITE_OTHER): Payer: Medicare Other

## 2014-10-31 VITALS — BP 123/74 | HR 66 | Temp 96.7°F | Ht 62.0 in | Wt 223.6 lb

## 2014-10-31 DIAGNOSIS — M25562 Pain in left knee: Secondary | ICD-10-CM

## 2014-10-31 MED ORDER — METHYLPREDNISOLONE ACETATE 80 MG/ML IJ SUSP
80.0000 mg | Freq: Once | INTRAMUSCULAR | Status: AC
  Administered 2014-10-31: 80 mg via INTRAMUSCULAR

## 2014-10-31 MED ORDER — BUPIVACAINE HCL 0.25 % IJ SOLN
3.0000 mL | Freq: Once | INTRAMUSCULAR | Status: AC
  Administered 2014-10-31: 3 mL

## 2014-10-31 NOTE — Addendum Note (Signed)
Addended by: Timmothy Euler on: 10/31/2014 02:50 PM   Modules accepted: Miquel Dunn

## 2014-10-31 NOTE — Addendum Note (Signed)
Addended by: Nigel Berthold C on: 10/31/2014 12:56 PM   Modules accepted: Orders

## 2014-10-31 NOTE — Patient Instructions (Signed)
Great to meet you!  Joint Injection Care After Refer to this sheet in the next few days. These instructions provide you with information on caring for yourself after you have had a joint injection. Your caregiver also may give you more specific instructions. Your treatment has been planned according to current medical practices, but problems sometimes occur. Call your caregiver if you have any problems or questions after your procedure. After any type of joint injection, it is not uncommon to experience:  Soreness, swelling, or bruising around the injection site.  Mild numbness, tingling, or weakness around the injection site caused by the numbing medicine used before or with the injection. It also is possible to experience the following effects associated with the specific agent after injection:  Iodine-based contrast agents:  Allergic reaction (itching, hives, widespread redness, and swelling beyond the injection site).  Corticosteroids (These effects are rare.):  Allergic reaction.  Increased blood sugar levels (If you have diabetes and you notice that your blood sugar levels have increased, notify your caregiver).  Increased blood pressure levels.  Mood swings.  Hyaluronic acid in the use of viscosupplementation.  Temporary heat or redness.  Temporary rash and itching.  Increased fluid accumulation in the injected joint. These effects all should resolve within a day after your procedure.  HOME CARE INSTRUCTIONS  Limit yourself to light activity the day of your procedure. Avoid lifting heavy objects, bending, stooping, or twisting.  Take prescription or over-the-counter pain medication as directed by your caregiver.  You may apply ice to your injection site to reduce pain and swelling the day of your procedure. Ice may be applied 03-04 times:  Put ice in a plastic bag.  Place a towel between your skin and the bag.  Leave the ice on for no longer than 15-20 minutes  each time. SEEK IMMEDIATE MEDICAL CARE IF:   Pain and swelling get worse rather than better or extend beyond the injection site.  Numbness does not go away.  Blood or fluid continues to leak from the injection site.  You have chest pain.  You have swelling of your face or tongue.  You have trouble breathing or you become dizzy.  You develop a fever, chills, or severe tenderness at the injection site that last longer than 1 day. MAKE SURE YOU:  Understand these instructions.  Watch your condition.  Get help right away if you are not doing well or if you get worse. Document Released: 10/04/2010 Document Revised: 04/15/2011 Document Reviewed: 10/04/2010 [facility] Patient Information 2015 [place], [place]. This information is not intended to replace advice given to you by your health care provider. Make sure you discuss any questions you have with your health care provider.

## 2014-10-31 NOTE — Progress Notes (Signed)
   HPI  Patient presents today ear for evaluation of left knee pain  Patient exp/lains that she's had left hip pain for about one year intermittently also had a little bit less severe left knee pain w/hich has gradually worsened over the last 2-3 months. She describes it as a dull lateral knee pain worse with walking and resolved partially with tramadol and ibuprofen.  States that she notices swelling intermittently in the knee and it is generally worse at night.  He denies traumatic injury, any discrete injury to her knee, fever, chills, sweats, redness of knee.   She states that today her pain is most severe in her knee and that's what she would like to focus on.   Left hip pain is described as lateral left hip pain worse over the greater trochanter with tenderness to palpation of the area over the trochanter. She agrees that her neurosurgeon has called this bursitis.  She denies groin pain  PMH: Smoking status noted ROS: Per HPI  Objective: BP 123/74 mmHg  Pulse 66  Temp(Src) 96.7 F (35.9 C) (Oral)  Ht 5\' 2"  (1.575 m)  Wt 223 lb 9.6 oz (101.424 kg)  BMI 40.89 kg/m2 Gen: NAD, alert, cooperative with exam HEENT: NCAT Ext: No edema, warm Neuro: Alert and oriented, walks with a cane MSK: L knee without erythema, effusion, bruising, or gross deformity Medial joint line tenderness Mild crepitus ligamentously intact to Lachman's and with varus and valgus stress.  Negative McMurray's test  L hip with tenderness to palp over the greater troch on the L   DG L knee 10/31/2014: preserved joint space, mild OA findings with slight sclerosis    Procedure   Time out performed.  Area cleaned with iodine x 2 and wiped clear with alcohol swab.  Using 22 1 1/2 gauge needle 1 cc depo medrol and 4 cc's 1% Lidocaine were injected in the knee via the medial approach..  Sterile bandage placed.  Patient tolerated procedure well.  No complications.      Assessment and plan:  # L knee  pain Acute on chronic, with exam and Hx likely this is meniscal injury Offered conservative therapy vs injection, +/- ortho referral She would like injection which was performed after informed consent was discussed Ortho referral XR without significant OA findings   Orders Placed This Encounter  Procedures  . DG Knee 1-2 Views Left    Standing Status: Future     Number of Occurrences:      Standing Expiration Date: 12/31/2015    Order Specific Question:  Reason for Exam (SYMPTOM  OR DIAGNOSIS REQUIRED)    Answer:  knee pain, eval for OA    Order Specific Question:  Is the patient pregnant?    Answer:  No    Order Specific Question:  Preferred imaging location?    Answer:  Internal     Laroy Apple, MD [facility] 10/31/2014, 10:49 AM

## 2014-12-01 DIAGNOSIS — S86912A Strain of unspecified muscle(s) and tendon(s) at lower leg level, left leg, initial encounter: Secondary | ICD-10-CM | POA: Diagnosis not present

## 2015-01-03 ENCOUNTER — Encounter: Payer: Self-pay | Admitting: Family Medicine

## 2015-01-03 ENCOUNTER — Ambulatory Visit (INDEPENDENT_AMBULATORY_CARE_PROVIDER_SITE_OTHER): Payer: Medicare Other | Admitting: Family Medicine

## 2015-01-03 VITALS — BP 127/82 | HR 71 | Temp 97.0°F | Ht 62.0 in | Wt 239.0 lb

## 2015-01-03 DIAGNOSIS — F3177 Bipolar disorder, in partial remission, most recent episode mixed: Secondary | ICD-10-CM | POA: Diagnosis not present

## 2015-01-03 DIAGNOSIS — IMO0001 Reserved for inherently not codable concepts without codable children: Secondary | ICD-10-CM

## 2015-01-03 DIAGNOSIS — E162 Hypoglycemia, unspecified: Secondary | ICD-10-CM | POA: Diagnosis not present

## 2015-01-03 DIAGNOSIS — Z23 Encounter for immunization: Secondary | ICD-10-CM | POA: Diagnosis not present

## 2015-01-03 DIAGNOSIS — F411 Generalized anxiety disorder: Secondary | ICD-10-CM

## 2015-01-03 DIAGNOSIS — R251 Tremor, unspecified: Secondary | ICD-10-CM

## 2015-01-03 DIAGNOSIS — I10 Essential (primary) hypertension: Secondary | ICD-10-CM | POA: Diagnosis not present

## 2015-01-03 LAB — GLUCOSE, POCT (MANUAL RESULT ENTRY): POC Glucose: 84 mg/dl (ref 70–99)

## 2015-01-03 LAB — POCT GLYCOSYLATED HEMOGLOBIN (HGB A1C): HEMOGLOBIN A1C: 5

## 2015-01-03 MED ORDER — BUSPIRONE HCL 10 MG PO TABS: 20.0000 mg | ORAL_TABLET | Freq: Two times a day (BID) | ORAL | Status: DC

## 2015-01-03 NOTE — Progress Notes (Signed)
Subjective:  Patient ID: Tamara Mccullough, female    DOB: 13-Dec-1955  Age: 59 y.o. MRN: [id number]  CC: 3 month follow up   HPI Tamara Mccullough presents for Shaky inside since getting up this morning. Had to attend best friend's funeral this AM. Feels llike panic or low blood sugar. Struggling to keep control. No longer taking diazepam X 3-4 months. Had gastric bypass and lost 140 pounds. Now has gained 40 back. Can't tolerate sweets. Eats about 1/4 of her plate.     History Tamara Mccullough has a past medical history of Anemia; Depression; Depression; Morbid obesity ([place]); Edema; Hypertension; Chronic back pain; Peripheral neuropathy ([place]); Hyperlipidemia; Panic attacks; Insomnia; Restless leg syndrome; PONV (postoperative nausea and vomiting); Sleep apnea; Asthma; Arthritis; and Seizures ([place]).   She has past surgical history that includes gastric by pass (6/11); Tubal ligation; Back surgery; Thyroid lobectomy (Right, 08/12/2013); Minor hemorrhoidectomy; CTR; and Anterior cervical decomp/discectomy fusion (N/A, 11/09/2013).   Her family history includes Alcohol abuse in her brother, brother, brother, father, paternal grandfather, and sister; Cancer in her mother; Congestive Heart Failure in her father and mother; Diabetes in her mother; Emphysema in her father.She reports that she quit smoking about 18 years ago. She has never used smokeless tobacco. She reports that she drinks about 7.2 oz of alcohol per week. She reports that she uses illicit drugs (Benzodiazepines).  Outpatient Prescriptions Prior to Visit  Medication Sig Dispense Refill  . diclofenac sodium (VOLTAREN) 1 % GEL Apply 2 g topically 3 (three) times daily.    Marland Kitchen FLUoxetine (PROZAC) 20 MG tablet Take 60 mg by mouth every morning.     . hydrOXYzine (ATARAX/VISTARIL) 10 MG tablet Take 10 mg by mouth 3 (three) times daily.    Marland Kitchen lamoTRIgine (LAMICTAL) 150 MG tablet Take 150 mg by mouth every morning.    Marland Kitchen lisinopril-hydrochlorothiazide  (PRINZIDE,ZESTORETIC) 10-12.5 MG per tablet Take 1 tablet by mouth every morning. 90 tablet 1  . rosuvastatin (CRESTOR) 5 MG tablet Take 1 tablet (5 mg total) by mouth daily. 90 tablet 3  . traMADol (ULTRAM) 50 MG tablet TAKE 1 TABLET EVERY 6 TO 8 HOURS  0  . zolpidem (AMBIEN) 10 MG tablet Take 10 mg by mouth at bedtime as needed for sleep.    . busPIRone (BUSPAR) 10 MG tablet Take 10 mg by mouth 3 (three) times daily.    . pramipexole (MIRAPEX) 0.5 MG tablet Take 1 tablet (0.5 mg total) by mouth at bedtime. 90 tablet 1  . diazepam (VALIUM) 5 MG tablet TAKE 1 TABLET EVERY 6 TO 8 HOURS  0   No facility-administered medications prior to visit.    ROS Review of Systems  Constitutional: Negative for fever, activity change and appetite change.  HENT: Negative for congestion, rhinorrhea and sore throat.   Eyes: Negative for visual disturbance.  Respiratory: Negative for cough and shortness of breath.   Cardiovascular: Negative for chest pain and palpitations.  Gastrointestinal: Negative for nausea, abdominal pain and diarrhea.  Genitourinary: Negative for dysuria.  Musculoskeletal: Negative for myalgias and arthralgias.  Psychiatric/Behavioral: Positive for decreased concentration and agitation. Negative for behavioral problems and confusion. The patient is nervous/anxious.     Objective:  BP 127/82 mmHg  Pulse 71  Temp(Src) 97 F (36.1 C) (Oral)  Ht 5\' 2"  (1.575 m)  Wt 239 lb (108.41 kg)  BMI 43.70 kg/m2  BP Readings from Last 3 Encounters:  01/05/15 111/68  01/03/15 127/82  10/31/14 123/74    Wt  Readings from Last 3 Encounters:  01/05/15 239 lb (108.41 kg)  01/03/15 239 lb (108.41 kg)  10/31/14 223 lb 9.6 oz (101.424 kg)     Physical Exam  Constitutional: She is oriented to person, place, and time. She appears well-developed and well-nourished. No distress.  HENT:  Head: Normocephalic and atraumatic.  Right Ear: External ear normal.  Left Ear: External ear normal.    Nose: Nose normal.  Mouth/Throat: Oropharynx is clear and moist.  Eyes: Conjunctivae and EOM are normal. Pupils are equal, round, and reactive to light.  Neck: Normal range of motion. Neck supple. No thyromegaly present.  Cardiovascular: Normal rate, regular rhythm and normal heart sounds.   No murmur heard. Pulmonary/Chest: Effort normal and breath sounds normal. No respiratory distress. She has no wheezes. She has no rales.  Abdominal: Soft. Bowel sounds are normal. She exhibits no distension. There is no tenderness.  Lymphadenopathy:    She has no cervical adenopathy.  Neurological: She is alert and oriented to person, place, and time. She has normal reflexes.  Skin: Skin is warm and dry.  Psychiatric: Her speech is normal. Judgment and thought content normal. Her mood appears anxious. Her affect is labile. Her affect is not angry. She is agitated. She is not aggressive, not hyperactive, not slowed and not combative. Cognition and memory are normal. She is attentive.    Lab Results  Component Value Date   HGBA1C 5.0 01/03/2015   HGBA1C 5.2 09/30/2014   HGBA1C 5.5% 08/31/2012    Lab Results  Component Value Date   WBC 7.1 01/03/2015   HGB 12.0 11/01/2013   HCT 44.2 01/03/2015   PLT 232 11/01/2013   GLUCOSE 113* 09/30/2014   CHOL 155 09/30/2014   TRIG 93 09/30/2014   HDL 74 09/30/2014   LDLCALC 62 09/30/2014   ALT 18 09/30/2014   AST 18 09/30/2014   NA 140 09/30/2014   K 4.6 09/30/2014   CL 100 09/30/2014   CREATININE 1.17* 09/30/2014   BUN 27 09/30/2014   CO2 19 09/30/2014   TSH 6.430* 03/07/2014   INR 0.97 07/28/2013   HGBA1C 5.0 01/03/2015    No results found.  Assessment & Plan:   Tamara Mccullough was seen today for 3 month follow up.  Diagnoses and all orders for this visit:  GAD (generalized anxiety disorder) -     POCT glucose (manual entry) -     POCT glycosylated hemoglobin (Hb A1C) -     Cancel: D-dimer, quantitative (not at [facility]) -     CBC with  Differential/Platelet  Shaking spells -     POCT glucose (manual entry) -     POCT glycosylated hemoglobin (Hb A1C) -     Cancel: D-dimer, quantitative (not at [facility]) -     CBC with Differential/Platelet -     EKG 12-Lead  Essential hypertension, benign -     POCT glucose (manual entry) -     POCT glycosylated hemoglobin (Hb A1C) -     Cancel: D-dimer, quantitative (not at [facility]) -     CBC with Differential/Platelet -     EKG 12-Lead  Bipolar disorder, in partial remission, most recent episode mixed (HCC) -     POCT glucose (manual entry) -     POCT glycosylated hemoglobin (Hb A1C) -     Cancel: D-dimer, quantitative (not at [facility]) -     CBC with Differential/Platelet  Hypoglycemia -     POCT glucose (manual entry) -  POCT glycosylated hemoglobin (Hb A1C) -     Cancel: D-dimer, quantitative (not at [facility]) -     CBC with Differential/Platelet -     EKG 12-Lead  Encounter for immunization  Other orders -     busPIRone (BUSPAR) 10 MG tablet; Take 2 tablets (20 mg total) by mouth 2 (two) times daily. -     Flu Vaccine QUAD 36+ mos IM  I have discontinued Ms. Chriswell's diazepam. I have also changed her busPIRone. Additionally, I am having her maintain her FLUoxetine, lamoTRIgine, hydrOXYzine, diclofenac sodium, zolpidem, traMADol, lisinopril-hydrochlorothiazide, and rosuvastatin.  Meds ordered this encounter  Medications  . busPIRone (BUSPAR) 10 MG tablet    Sig: Take 2 tablets (20 mg total) by mouth 2 (two) times daily.    Dispense:  120 tablet    Refill:  2   Approximately 45 minutes were spent with this patient over half of which was spent in counseling regarding anxiety and grieving over her friend. Additionally relating this back to her bipolar disorder. We discussed referral to psychology and psychiatric management.  Follow-up: Return in about 3 months (around 04/04/2015) for hypertension, Arthritis.  Claretta Fraise, M.D.

## 2015-01-04 LAB — CBC WITH DIFFERENTIAL/PLATELET
BASOS: 0 %
Basophils Absolute: 0 10*3/uL (ref 0.0–0.2)
EOS (ABSOLUTE): 0.1 10*3/uL (ref 0.0–0.4)
EOS: 2 %
HEMATOCRIT: 44.2 % (ref 34.0–46.6)
HEMOGLOBIN: 14.3 g/dL (ref 11.1–15.9)
Immature Grans (Abs): 0 10*3/uL (ref 0.0–0.1)
Immature Granulocytes: 0 %
LYMPHS ABS: 1.7 10*3/uL (ref 0.7–3.1)
Lymphs: 24 %
MCH: 30.1 pg (ref 26.6–33.0)
MCHC: 32.4 g/dL (ref 31.5–35.7)
MCV: 93 fL (ref 79–97)
MONOCYTES: 7 %
Monocytes Absolute: 0.5 10*3/uL (ref 0.1–0.9)
NEUTROS ABS: 4.8 10*3/uL (ref 1.4–7.0)
Neutrophils: 67 %
Platelets: 198 10*3/uL (ref 150–379)
RBC: 4.75 x10E6/uL (ref 3.77–5.28)
RDW: 13.9 % (ref 12.3–15.4)
WBC: 7.1 10*3/uL (ref 3.4–10.8)

## 2015-01-05 ENCOUNTER — Telehealth: Payer: Self-pay | Admitting: Family Medicine

## 2015-01-05 ENCOUNTER — Ambulatory Visit (INDEPENDENT_AMBULATORY_CARE_PROVIDER_SITE_OTHER): Payer: Medicare Other | Admitting: Family Medicine

## 2015-01-05 ENCOUNTER — Ambulatory Visit: Payer: Medicare Other | Admitting: Family Medicine

## 2015-01-05 VITALS — BP 111/68 | HR 65 | Ht 62.0 in | Wt 239.0 lb

## 2015-01-05 DIAGNOSIS — S0191XA Laceration without foreign body of unspecified part of head, initial encounter: Secondary | ICD-10-CM | POA: Diagnosis not present

## 2015-01-05 DIAGNOSIS — G2581 Restless legs syndrome: Secondary | ICD-10-CM

## 2015-01-05 MED ORDER — PRAMIPEXOLE DIHYDROCHLORIDE 0.5 MG PO TABS: 0.5000 mg | ORAL_TABLET | Freq: Every day | ORAL | Status: DC

## 2015-01-05 NOTE — Progress Notes (Addendum)
   HPI  Patient presents today here with a head laceration. Patient explains that she was cleaning this morning when a glass lamp fell and hit her in the head She had heavy bleeding which stopped with pressure. She comes in for getting it addressed and states that she is not worried about scarring.  She denies any pain, fever, chills, sweats.   PMH: Smoking status noted ROS: Per HPI  Objective: BP 111/68 mmHg  Pulse 65  Ht 5\' 2"  (1.575 m)  Wt 239 lb (108.41 kg)  BMI 43.70 kg/m2 Gen: NAD, alert, cooperative with exam HEENT: NCAT CV: RRR, good S1/S2, no murmur Resp: CTABL, no wheezes, non-labored  Skin: One and a half centimeter laceration on left side of her for head, edges are well approximated and there is hemostasis   Area was flushed and cleaned with saline, then cleaned with an alcohol pad. The edges of the wound were pinched together and Dermabond applied in 3-4 layers, there was minimal rebleeding during the application and she tolerated it well. There is hemostasis before she left.   Assessment and plan:  # Head laceration After discussion we have used Dermabond to close her wound which was practically closed already Discussed signs and symptoms of infection and welcomed her to come back if he should develop   Laroy Apple, MD [facility] 01/05/2015, 10:57 AM    She has returned to clinic with some bleeding. She explained that she took a nap and believes that she may have stressed the wound.  On exam the bleeding has stopped, however whenever I cleaned it with gauze it oozes very slightly.  Again I offered her sutures, she declines this. She would like me to place Steri-Strips which I did. I explained that her scar formation will be a little bit more severe using only Steri-Strips as the wound has opened back up slightly. I also recommended applying pressure for future bleeding if it occurs again today. I have provided the signs of  infection and she will follow-up if any of these develop.  Laroy Apple, MD [facility] 01/05/2015, 3:24 PM

## 2015-01-05 NOTE — Progress Notes (Signed)
Patient aware.

## 2015-01-05 NOTE — Telephone Encounter (Signed)
done

## 2015-01-05 NOTE — Patient Instructions (Signed)
Great to meet you!  Se ethe handout for information specific to dermabond  If you develop signs of infection (rednes sheat or worsening pain) then please come back.

## 2015-01-12 DIAGNOSIS — F3181 Bipolar II disorder: Secondary | ICD-10-CM | POA: Diagnosis not present

## 2015-03-05 ENCOUNTER — Other Ambulatory Visit: Payer: Self-pay | Admitting: Nurse Practitioner

## 2015-03-06 ENCOUNTER — Telehealth: Payer: Self-pay | Admitting: Family Medicine

## 2015-03-24 ENCOUNTER — Encounter (HOSPITAL_COMMUNITY): Payer: Self-pay | Admitting: Emergency Medicine

## 2015-03-24 ENCOUNTER — Emergency Department (HOSPITAL_COMMUNITY)
Admission: EM | Admit: 2015-03-24 | Discharge: 2015-03-24 | Disposition: A | Discharge status: Other Acute Inpatient Hospital | Payer: Medicare Other | Attending: Emergency Medicine | Admitting: Emergency Medicine

## 2015-03-24 ENCOUNTER — Encounter (HOSPITAL_COMMUNITY): Payer: Self-pay | Admitting: *Deleted

## 2015-03-24 ENCOUNTER — Inpatient Hospital Stay (HOSPITAL_COMMUNITY)
Admission: AD | Admit: 2015-03-24 | Discharge: 2015-03-28 | DRG: 885 | Disposition: A | Discharge status: Home / Self Care | Payer: Medicare Other | Source: Intra-hospital | Attending: Psychiatry | Admitting: Psychiatry

## 2015-03-24 DIAGNOSIS — F419 Anxiety disorder, unspecified: Secondary | ICD-10-CM | POA: Diagnosis present

## 2015-03-24 DIAGNOSIS — G2581 Restless legs syndrome: Secondary | ICD-10-CM | POA: Diagnosis present

## 2015-03-24 DIAGNOSIS — Z811 Family history of alcohol abuse and dependence: Secondary | ICD-10-CM

## 2015-03-24 DIAGNOSIS — Z791 Long term (current) use of non-steroidal anti-inflammatories (NSAID): Secondary | ICD-10-CM | POA: Diagnosis not present

## 2015-03-24 DIAGNOSIS — I10 Essential (primary) hypertension: Secondary | ICD-10-CM | POA: Diagnosis present

## 2015-03-24 DIAGNOSIS — Z825 Family history of asthma and other chronic lower respiratory diseases: Secondary | ICD-10-CM | POA: Diagnosis not present

## 2015-03-24 DIAGNOSIS — F410 Panic disorder [episodic paroxysmal anxiety] without agoraphobia: Secondary | ICD-10-CM | POA: Diagnosis present

## 2015-03-24 DIAGNOSIS — M47819 Spondylosis without myelopathy or radiculopathy, site unspecified: Secondary | ICD-10-CM | POA: Diagnosis not present

## 2015-03-24 DIAGNOSIS — E785 Hyperlipidemia, unspecified: Secondary | ICD-10-CM | POA: Diagnosis present

## 2015-03-24 DIAGNOSIS — Z8249 Family history of ischemic heart disease and other diseases of the circulatory system: Secondary | ICD-10-CM | POA: Diagnosis not present

## 2015-03-24 DIAGNOSIS — F1310 Sedative, hypnotic or anxiolytic abuse, uncomplicated: Secondary | ICD-10-CM | POA: Insufficient documentation

## 2015-03-24 DIAGNOSIS — Z809 Family history of malignant neoplasm, unspecified: Secondary | ICD-10-CM | POA: Diagnosis not present

## 2015-03-24 DIAGNOSIS — Z87891 Personal history of nicotine dependence: Secondary | ICD-10-CM | POA: Diagnosis not present

## 2015-03-24 DIAGNOSIS — F10239 Alcohol dependence with withdrawal, unspecified: Secondary | ICD-10-CM | POA: Insufficient documentation

## 2015-03-24 DIAGNOSIS — M1990 Unspecified osteoarthritis, unspecified site: Secondary | ICD-10-CM | POA: Diagnosis not present

## 2015-03-24 DIAGNOSIS — J45909 Unspecified asthma, uncomplicated: Secondary | ICD-10-CM | POA: Diagnosis present

## 2015-03-24 DIAGNOSIS — Z833 Family history of diabetes mellitus: Secondary | ICD-10-CM | POA: Diagnosis not present

## 2015-03-24 DIAGNOSIS — G4700 Insomnia, unspecified: Secondary | ICD-10-CM | POA: Diagnosis present

## 2015-03-24 DIAGNOSIS — Z79899 Other long term (current) drug therapy: Secondary | ICD-10-CM | POA: Diagnosis not present

## 2015-03-24 DIAGNOSIS — F1021 Alcohol dependence, in remission: Secondary | ICD-10-CM | POA: Diagnosis present

## 2015-03-24 DIAGNOSIS — R251 Tremor, unspecified: Secondary | ICD-10-CM | POA: Insufficient documentation

## 2015-03-24 DIAGNOSIS — G8929 Other chronic pain: Secondary | ICD-10-CM | POA: Insufficient documentation

## 2015-03-24 DIAGNOSIS — F10939 Alcohol use, unspecified with withdrawal, unspecified: Secondary | ICD-10-CM

## 2015-03-24 DIAGNOSIS — Z862 Personal history of diseases of the blood and blood-forming organs and certain disorders involving the immune mechanism: Secondary | ICD-10-CM | POA: Diagnosis not present

## 2015-03-24 DIAGNOSIS — F314 Bipolar disorder, current episode depressed, severe, without psychotic features: Principal | ICD-10-CM | POA: Diagnosis present

## 2015-03-24 DIAGNOSIS — F411 Generalized anxiety disorder: Secondary | ICD-10-CM | POA: Diagnosis present

## 2015-03-24 DIAGNOSIS — F329 Major depressive disorder, single episode, unspecified: Secondary | ICD-10-CM | POA: Diagnosis not present

## 2015-03-24 DIAGNOSIS — F1020 Alcohol dependence, uncomplicated: Secondary | ICD-10-CM | POA: Insufficient documentation

## 2015-03-24 LAB — COMPREHENSIVE METABOLIC PANEL
ALT: 24 U/L (ref 14–54)
AST: 31 U/L (ref 15–41)
Albumin: 4 g/dL (ref 3.5–5.0)
Alkaline Phosphatase: 92 U/L (ref 38–126)
Anion gap: 16 — ABNORMAL HIGH (ref 5–15)
BUN: 14 mg/dL (ref 6–20)
CHLORIDE: 102 mmol/L (ref 101–111)
CO2: 23 mmol/L (ref 22–32)
CREATININE: 0.84 mg/dL (ref 0.44–1.00)
Calcium: 8.6 mg/dL — ABNORMAL LOW (ref 8.9–10.3)
GFR calc non Af Amer: >60 mL/min (ref >60)
Glucose, Bld: 115 mg/dL — ABNORMAL HIGH (ref 65–99)
Potassium: 3.7 mmol/L (ref 3.5–5.1)
SODIUM: 141 mmol/L (ref 135–145)
Total Bilirubin: 1.3 mg/dL — ABNORMAL HIGH (ref 0.3–1.2)
Total Protein: 6.7 g/dL (ref 6.5–8.1)

## 2015-03-24 LAB — CBC WITH DIFFERENTIAL/PLATELET
BASOS ABS: 0 10*3/uL (ref 0.0–0.1)
BASOS PCT: 1 %
Eosinophils Absolute: 0 10*3/uL (ref 0.0–0.7)
Eosinophils Relative: 0 %
HCT: 45.9 % (ref 36.0–46.0)
Hemoglobin: 15 g/dL (ref 12.0–15.0)
LYMPHS PCT: 16 %
Lymphs Abs: 1.2 10*3/uL (ref 0.7–4.0)
MCH: 29.9 pg (ref 26.0–34.0)
MCHC: 32.7 g/dL (ref 30.0–36.0)
MCV: 91.6 fL (ref 78.0–100.0)
Monocytes Absolute: 0.6 10*3/uL (ref 0.1–1.0)
Monocytes Relative: 8 %
NEUTROS ABS: 5.6 10*3/uL (ref 1.7–7.7)
NEUTROS PCT: 75 %
Platelets: 202 10*3/uL (ref 150–400)
RBC: 5.01 MIL/uL (ref 3.87–5.11)
RDW: 15.5 % (ref 11.5–15.5)
WBC: 7.4 10*3/uL (ref 4.0–10.5)

## 2015-03-24 LAB — RAPID URINE DRUG SCREEN, HOSP PERFORMED
Amphetamines: NOT DETECTED
BARBITURATES: NOT DETECTED
Benzodiazepines: POSITIVE — AB
Cocaine: NOT DETECTED
OPIATES: NOT DETECTED
TETRAHYDROCANNABINOL: NOT DETECTED

## 2015-03-24 LAB — ETHANOL

## 2015-03-24 MED ORDER — MAGNESIUM HYDROXIDE 400 MG/5ML PO SUSP: 30.0000 mL | Freq: Every day | ORAL | Status: DC | PRN

## 2015-03-24 MED ORDER — PRAMIPEXOLE DIHYDROCHLORIDE 0.25 MG PO TABS: 0.5000 mg | ORAL_TABLET | Freq: Every day | ORAL | Status: DC

## 2015-03-24 MED ORDER — LISINOPRIL-HYDROCHLOROTHIAZIDE 10-12.5 MG PO TABS: 1.0000 | ORAL_TABLET | Freq: Every morning | ORAL | Status: DC

## 2015-03-24 MED ORDER — ALUM & MAG HYDROXIDE-SIMETH 200-200-20 MG/5ML PO SUSP: 30.0000 mL | ORAL | Status: DC | PRN

## 2015-03-24 MED ORDER — LORAZEPAM 1 MG PO TABS
1.0000 mg | ORAL_TABLET | ORAL | Status: AC
  Administered 2015-03-24: 1 mg via ORAL

## 2015-03-24 MED ORDER — QUETIAPINE FUMARATE 100 MG PO TABS
100.0000 mg | ORAL_TABLET | Freq: Once | ORAL | Status: AC
  Administered 2015-03-24: 100 mg via ORAL
  Filled 2015-03-24 (×2): qty 1

## 2015-03-24 MED ORDER — HYDROCHLOROTHIAZIDE 12.5 MG PO CAPS
12.5000 mg | ORAL_CAPSULE | Freq: Every day | ORAL | Status: DC
  Administered 2015-03-25 – 2015-03-28 (×4): 12.5 mg via ORAL
  Filled 2015-03-24 (×6): qty 1

## 2015-03-24 MED ORDER — LAMOTRIGINE 100 MG PO TABS
150.0000 mg | ORAL_TABLET | Freq: Every morning | ORAL | Status: DC
  Administered 2015-03-25 – 2015-03-28 (×4): 150 mg via ORAL
  Filled 2015-03-24 (×4): qty 1
  Filled 2015-03-24: qty 11
  Filled 2015-03-24: qty 1

## 2015-03-24 MED ORDER — FLUOXETINE HCL 20 MG PO CAPS: 60.0000 mg | ORAL_CAPSULE | Freq: Every morning | ORAL | Status: DC

## 2015-03-24 MED ORDER — LORAZEPAM 1 MG PO TABS
1.0000 mg | ORAL_TABLET | Freq: Once | ORAL | Status: AC
  Administered 2015-03-24: 1 mg via ORAL
  Filled 2015-03-24: qty 1

## 2015-03-24 MED ORDER — LORAZEPAM 1 MG PO TABS: 0.0000 mg | ORAL_TABLET | Freq: Two times a day (BID) | ORAL | Status: DC

## 2015-03-24 MED ORDER — LORAZEPAM 1 MG PO TABS
0.0000 mg | ORAL_TABLET | Freq: Four times a day (QID) | ORAL | Status: DC
  Administered 2015-03-24: 1 mg via ORAL
  Filled 2015-03-24: qty 1

## 2015-03-24 MED ORDER — TRAMADOL HCL 50 MG PO TABS: 50.0000 mg | ORAL_TABLET | Freq: Two times a day (BID) | ORAL | Status: DC | PRN

## 2015-03-24 MED ORDER — HYDROXYZINE HCL 10 MG PO TABS
10.0000 mg | ORAL_TABLET | Freq: Three times a day (TID) | ORAL | Status: DC
  Filled 2015-03-24: qty 1

## 2015-03-24 MED ORDER — LISINOPRIL 10 MG PO TABS: 10.0000 mg | ORAL_TABLET | Freq: Every day | ORAL | Status: DC

## 2015-03-24 MED ORDER — HYDROXYZINE HCL 25 MG PO TABS
25.0000 mg | ORAL_TABLET | Freq: Three times a day (TID) | ORAL | Status: DC | PRN
  Administered 2015-03-25: 25 mg via ORAL
  Filled 2015-03-24: qty 1

## 2015-03-24 MED ORDER — FLUOXETINE HCL 20 MG PO CAPS
60.0000 mg | ORAL_CAPSULE | Freq: Every morning | ORAL | Status: DC
  Administered 2015-03-25 – 2015-03-28 (×4): 60 mg via ORAL
  Filled 2015-03-24: qty 21
  Filled 2015-03-24 (×5): qty 3

## 2015-03-24 MED ORDER — ADULT MULTIVITAMIN W/MINERALS CH
1.0000 | ORAL_TABLET | Freq: Every day | ORAL | Status: DC
  Administered 2015-03-24: 1 via ORAL
  Filled 2015-03-24: qty 1

## 2015-03-24 MED ORDER — ACETAMINOPHEN 325 MG PO TABS
650.0000 mg | ORAL_TABLET | Freq: Four times a day (QID) | ORAL | Status: DC | PRN
  Administered 2015-03-25 – 2015-03-27 (×2): 650 mg via ORAL
  Filled 2015-03-24 (×2): qty 2

## 2015-03-24 MED ORDER — THIAMINE HCL 100 MG/ML IJ SOLN: 100.0000 mg | Freq: Every day | INTRAMUSCULAR | Status: DC

## 2015-03-24 MED ORDER — ZOLPIDEM TARTRATE 5 MG PO TABS: 10.0000 mg | ORAL_TABLET | Freq: Every evening | ORAL | Status: DC | PRN

## 2015-03-24 MED ORDER — LISINOPRIL 10 MG PO TABS
10.0000 mg | ORAL_TABLET | Freq: Every day | ORAL | Status: DC
  Administered 2015-03-25 – 2015-03-28 (×4): 10 mg via ORAL
  Filled 2015-03-24 (×6): qty 1

## 2015-03-24 MED ORDER — FOLIC ACID 1 MG PO TABS
1.0000 mg | ORAL_TABLET | Freq: Every day | ORAL | Status: DC
  Administered 2015-03-24: 1 mg via ORAL
  Filled 2015-03-24: qty 1

## 2015-03-24 MED ORDER — LORAZEPAM 1 MG PO TABS
ORAL_TABLET | ORAL | Status: AC
  Filled 2015-03-24: qty 1

## 2015-03-24 MED ORDER — BUSPIRONE HCL 5 MG PO TABS
20.0000 mg | ORAL_TABLET | Freq: Two times a day (BID) | ORAL | Status: DC
  Administered 2015-03-25 – 2015-03-28 (×7): 20 mg via ORAL
  Filled 2015-03-24 (×2): qty 2
  Filled 2015-03-24: qty 56
  Filled 2015-03-24 (×2): qty 2
  Filled 2015-03-24: qty 4
  Filled 2015-03-24 (×4): qty 2
  Filled 2015-03-24: qty 56
  Filled 2015-03-24: qty 2

## 2015-03-24 MED ORDER — BUSPIRONE HCL 10 MG PO TABS: 20.0000 mg | ORAL_TABLET | Freq: Two times a day (BID) | ORAL | Status: DC

## 2015-03-24 MED ORDER — HYDROCHLOROTHIAZIDE 12.5 MG PO CAPS: 12.5000 mg | ORAL_CAPSULE | Freq: Every day | ORAL | Status: DC

## 2015-03-24 MED ORDER — LAMOTRIGINE 25 MG PO TABS: 150.0000 mg | ORAL_TABLET | Freq: Every morning | ORAL | Status: DC

## 2015-03-24 MED ORDER — LORAZEPAM 2 MG/ML IJ SOLN: 1.0000 mg | Freq: Four times a day (QID) | INTRAMUSCULAR | Status: DC | PRN

## 2015-03-24 MED ORDER — VITAMIN B-1 100 MG PO TABS
100.0000 mg | ORAL_TABLET | Freq: Every day | ORAL | Status: DC
  Administered 2015-03-24: 100 mg via ORAL
  Filled 2015-03-24: qty 1

## 2015-03-24 MED ORDER — ZOLPIDEM TARTRATE 10 MG PO TABS: 20.0000 mg | ORAL_TABLET | Freq: Every day | ORAL | Status: DC

## 2015-03-24 MED ORDER — LORAZEPAM 1 MG PO TABS: 1.0000 mg | ORAL_TABLET | Freq: Four times a day (QID) | ORAL | Status: DC | PRN

## 2015-03-24 NOTE — ED Provider Notes (Signed)
CSN: [id number]     Arrival date & time 03/24/15  1224 History   First MD Initiated Contact with Patient 03/24/15 1635     Chief Complaint  Patient presents with  . Anxiety   HPI Patient presents to the emergency room for trouble with alcohol and drug withdrawal. The patient is an alcoholic and states she's been drinking about a fifth of alcohol daily. Her last drink was last evening. She also has history of abusing Ambien. Patient has been seeing a psychiatrist. She called them today and was told to come to the emergency room for medical clearance and possible admission for her since abuse problems. She states this morning she was feeling very anxious. She is feeling clammy and started to hyperventilate. She felt numbness and tingling in her hands and around her mouth. She feels like she is going to jump out of her skin. Her hands have been trembling. He trouble with chest pain or shortness of breath. She has had alcohol withdrawal seizures in the past. Past Medical History  Diagnosis Date  . Anemia   . Depression   . Depression   . Morbid obesity ([place])   . Edema   . Hypertension   . Chronic back pain   . Peripheral neuropathy ([place])   . Hyperlipidemia   . Panic attacks   . Insomnia   . Restless leg syndrome   . PONV (postoperative nausea and vomiting)   . Sleep apnea     has not used CPAP in 3 years   . Asthma     slight  . Arthritis     back   . Seizures ([place])     hx of seizure 05/2013    Past Surgical History  Procedure Laterality Date  . 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 Tubal ligation    . Back surgery    . Thyroid lobectomy Right 08/12/2013    Procedure: RIGHT THYROID LOBECTOMY;  Surgeon: Odis Hollingshead, MD;  Location: [facility]S;  Service: General;  Laterality: Right;  . Minor hemorrhoidectomy    . Ctr    . Anterior cervical decomp/discectomy fusion N/A 11/09/2013    Procedure:  Anterior cervical decompression/diskectomy/fusion cervical four-five ,cervical five-six,cervical  six-seven;  Surgeon: Floyce Stakes, MD;  Location: [facility];  Service: Neurosurgery;  Laterality: N/A;   Family History  Problem Relation Age of Onset  . Congestive Heart Failure Mother   . Diabetes Mother   . Cancer Mother     cervical  . Congestive Heart Failure Father   . Emphysema Father   . Alcohol abuse Father   . Alcohol abuse Sister   . Alcohol abuse Brother   . Alcohol abuse Paternal Grandfather   . Alcohol abuse Brother   . Alcohol abuse Brother    Social History  Substance Use Topics  . Smoking status: Former Smoker    Quit date: 02/05/1996  . Smokeless tobacco: Never Used  . Alcohol Use: 7.2 oz/week    12 Cans of beer per week     Comment: hx of etoh use none since 1990   OB History    No data available     Review of Systems  All other systems reviewed and are negative.     Allergies  Alcohol-sulfur; Gabapentin; Trazodone and nefazodone; Codeine; Eszopiclone; Lansoprazole; Motrin; and Prilosec  Home Medications   Prior to Admission medications   Medication Sig Start Date End Date Taking? Authorizing Provider  busPIRone (BUSPAR) 10 MG  tablet Take 2 tablets (20 mg total) by mouth 2 (two) times daily. 01/03/15   Claretta Fraise, MD  diclofenac sodium (VOLTAREN) 1 % GEL Apply 2 g topically 3 (three) times daily.    Historical Provider, MD  FLUoxetine (PROZAC) 20 MG tablet Take 60 mg by mouth every morning.     Historical Provider, MD  hydrOXYzine (ATARAX/VISTARIL) 10 MG tablet Take 10 mg by mouth 3 (three) times daily.    Historical Provider, MD  lamoTRIgine (LAMICTAL) 150 MG tablet Take 150 mg by mouth every morning.    Historical Provider, MD  lisinopril-hydrochlorothiazide (PRINZIDE,ZESTORETIC) 10-12.5 MG per tablet Take 1 tablet by mouth every morning. 09/30/14   Claretta Fraise, MD  nystatin cream (MYCOSTATIN) APPLY 1 APPLICATION TOPICALLY 2 (TWO) TIMES DAILY. 03/06/15   Timmothy Euler, MD  pramipexole (MIRAPEX) 0.5 MG tablet Take 1 tablet (0.5 mg  total) by mouth at bedtime. 01/05/15   Claretta Fraise, MD  rosuvastatin (CRESTOR) 5 MG tablet Take 1 tablet (5 mg total) by mouth daily. 09/30/14   Claretta Fraise, MD  traMADol (ULTRAM) 50 MG tablet TAKE 1 TABLET EVERY 6 TO 8 HOURS 07/13/14   Historical Provider, MD  zolpidem (AMBIEN) 10 MG tablet Take 10 mg by mouth at bedtime as needed for sleep.    Historical Provider, MD   BP 115/67 mmHg  Pulse 88  Temp(Src) 98.1 F (36.7 C) (Oral)  Resp 20  SpO2 98% Physical Exam  Constitutional: She appears well-developed and well-nourished. No distress.  HENT:  Head: Normocephalic and atraumatic.  Right Ear: External ear normal.  Left Ear: External ear normal.  Eyes: Conjunctivae are normal. Right eye exhibits no discharge. Left eye exhibits no discharge. No scleral icterus.  Neck: Neck supple. No tracheal deviation present.  Cardiovascular: Normal rate, regular rhythm and intact distal pulses.   Pulmonary/Chest: Effort normal and breath sounds normal. No stridor. No respiratory distress. She has no wheezes. She has no rales.  Abdominal: Soft. Bowel sounds are normal. She exhibits no distension. There is no tenderness. There is no rebound and no guarding.  Musculoskeletal: She exhibits no edema or tenderness.  Neurological: She is alert. She has normal strength. She displays tremor. No cranial nerve deficit (no facial droop, extraocular movements intact, no slurred speech) or sensory deficit. She exhibits normal muscle tone. She displays no seizure activity. Coordination normal.  Skin: Skin is warm and dry. No rash noted.  Psychiatric: She has a normal mood and affect.  Nursing note and vitals reviewed.   ED Course  Procedures (including critical care time) Labs Review Labs Reviewed  COMPREHENSIVE METABOLIC PANEL - Abnormal; Notable for the following:    Glucose, Bld 115 (*)    Calcium 8.6 (*)    Total Bilirubin 1.3 (*)    Anion gap 16 (*)    All other components within normal limits  ETHANOL   CBC WITH DIFFERENTIAL/PLATELET  URINE RAPID DRUG SCREEN, HOSP PERFORMED      EKG Interpretation   Date/Time:  Friday March 24 2015 17:21:38 EST Ventricular Rate:  77 PR Interval:  158 QRS Duration: 108 QT Interval:  454 QTC Calculation: 513 R Axis:   -40 Text Interpretation:   Poor data quality, interpretation may be  adversely affected Normal sinus rhythm Left axis deviation Minimal voltage  criteria for LVH, may be normal variant T wave abnormality, consider  anterior ischemia Prolonged QT , increased since last tracing Abnormal ECG  Confirmed by Ngoc Detjen  MD-J, Makaila Windle [id number]) on 03/24/2015 6:00:13  PM      MDM   Final diagnoses:  Alcohol withdrawal, with unspecified complication ([place])  Anxiety    Pt appears medically stable.  Labs are normal.  Pt is not intoxicated.  She is having withdrawal symptoms.  CIWA score is 8. But she is not vomiting and is improving with oral medications.  Pt states he psychiatrist sent her here for psychiatric evaluation.  She does not feel that she will be safe at home. Will consult with TTS      Dorie Rank, MD 03/24/15 [phone]

## 2015-03-24 NOTE — Progress Notes (Signed)
Patient ID: Tamara Mccullough, female   DOB: 13-Jun-1955, 60 y.o.   MRN: [id number]  Per State regulations 482.30 this chart was reviewed for medical necessity with respect to the patient's admission/duration of stay.    Next review date: 03/28/15  Debarah Crape, BSN, RN-BC  Case Manager

## 2015-03-24 NOTE — ED Notes (Signed)
Pt reports anxious feeling all morning. Pt states feet feel clammy and states she wants to jump out of her skin. PT denies SI/HI. Anxious appearance. VSS.

## 2015-03-24 NOTE — BH Assessment (Addendum)
Tele Assessment Note   Tamara Mccullough is a divorced 60 y.o. Caucasian female who presents unaccompanied to [facility] after being referred for assessment by her psychiatrist, Dr. Launa Grill. Pt reports she has a diagnosis of bipolar disorder and anxiety. She says she had a panic attack today and contacted Dr. Reece Levy, who said she needs inpatient treatment. She says she is severely depressed and anxious and is abusing alcohol and Ambien. Pt reports she was sober from alcohol twenty years and relapsed six months ago. She reports she is drinking one half a fifth of liquor daily. She reports abusing Ambien for the past 4-5 years and is taking 20-60 mg nightly. She reports she accidentally overdosed three months ago and was monitored at [facility] ED after having a seizure. She reports withdrawal symptoms including sweats, tremors and severe anxiety. She also has a history of blackout and reports she fell last night. Pt reports symptoms including crying spells, social withdrawal, loss of interest in usual pleasures, decreased concentration and feelings of guilt and hopelessness. She reports suicidal ideation with no intent but says she would like to drink, take Ambien and not wake up. She denies any previous suicide attempts. She state her sister attempted suicide. She denies any intentional self-injurious behavior. She denies homicidal ideation or history of violence. She denies any auditory or visual hallucinations. She says she sometimes believes there are people in her house or hears knocking on her door when no one is there.   Pt identifies consequences of her substance use and her mental health symptoms as her primary stressor. She reports her daughter and two grandchildren live in [place] and it is difficult being away from them. She reports she is retired and lives alone. She identified two friends as her primary supports. She says there is an extensive history of mental health problems, primarily bipolar  disorder, on both sides of her family, that her father was an alcoholic and her three brothers have history of substance abuse.   Pt is currently receiving outpatient medication management with Dr. Reece Levy. She denies any history of inpatient psychiatric or substance abuse treatment. Pt was in chemical dependency IOP at [facility] in February 2015. Pt reports she had gastric bypass surgery in 2005.  Pt is casually dressed, alert, oriented x4 with normal speech and normal motor behavior. Eye contact is good. Pt's mood is depressed and anxious; affect is congruent with mood. Thought process is coherent and relevant. There is no indication Pt is currently responding to internal stimuli or experiencing delusional thought content. Pt was cooperative throughout assessment. She says she is seeking inpatient treatment to address her substance abuse and mental health symptoms.    Diagnosis: Bipolar I Disorder, Current Episode Depressed, Severe; Generalized Anxiety Disorder; Alcohol Use Disorder, Severe; Ambien Use Disorder, Severe  Past Medical History:  Past Medical History  Diagnosis Date  . Anemia   . Depression   . Depression   . Morbid obesity ([place])   . Edema   . Hypertension   . Chronic back pain   . Peripheral neuropathy ([place])   . Hyperlipidemia   . Panic attacks   . Insomnia   . Restless leg syndrome   . PONV (postoperative nausea and vomiting)   . Sleep apnea     has not used CPAP in 3 years   . Asthma     slight  . Arthritis     back   . Seizures ([place])  hx of seizure 05/2013     Past Surgical History  Procedure Laterality Date  . 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 Tubal ligation    . Back surgery    . Thyroid lobectomy Right 08/12/2013    Procedure: RIGHT THYROID LOBECTOMY;  Surgeon: Odis Hollingshead, MD;  Location: [facility] ORS;  Service: General;  Laterality: Right;  . Minor hemorrhoidectomy    . Ctr    . Anterior cervical decomp/discectomy fusion N/A 11/09/2013    Procedure:  Anterior  cervical decompression/diskectomy/fusion cervical four-five ,cervical five-six,cervical six-seven;  Surgeon: Floyce Stakes, MD;  Location: [facility];  Service: Neurosurgery;  Laterality: N/A;    Family History:  Family History  Problem Relation Age of Onset  . Congestive Heart Failure Mother   . Diabetes Mother   . Cancer Mother     cervical  . Congestive Heart Failure Father   . Emphysema Father   . Alcohol abuse Father   . Alcohol abuse Sister   . Alcohol abuse Brother   . Alcohol abuse Paternal Grandfather   . Alcohol abuse Brother   . Alcohol abuse Brother     Social History:  reports that she quit smoking about 19 years ago. She has never used smokeless tobacco. She reports that she drinks about 7.2 oz of alcohol per week. She reports that she uses illicit drugs (Benzodiazepines).  Additional Social History:  Alcohol / Drug Use Pain Medications: Denies abuse Prescriptions: Pt reports abusing Ambien Over the Counter: Denies abuse History of alcohol / drug use?: Yes Longest period of sobriety (when/how long): Twenty years Negative Consequences of Use: Personal relationships Withdrawal Symptoms: Blackouts, Tremors, Seizures, Sweats Onset of Seizures: 01/2015 Date of most recent seizure: 01/2015 Substance #1 Name of Substance 1: Alcohol 1 - Age of First Use: 14 1 - Amount (size/oz): One half a fifth of liquor 1 - Frequency: Daily 1 - Duration: Relapsed six months ago 1 - Last Use / Amount: 03/24/15, two shots rum Substance #2 Name of Substance 2: Ambien 2 - Age of First Use: 49 2 - Amount (size/oz): 20-60 mg 2 - Frequency: Daily 2 - Duration: Abusing 4-5 years 2 - Last Use / Amount: 03/23/15  CIWA: CIWA-Ar BP: 115/67 mmHg Pulse Rate: 88 Nausea and Vomiting: no nausea and no vomiting Tactile Disturbances: none Tremor: two Auditory Disturbances: not present Paroxysmal Sweats: no sweat visible Visual Disturbances: not present Anxiety: two Headache,  Fullness in Head: none present Agitation: normal activity Orientation and Clouding of Sensorium: oriented and can do serial additions CIWA-Ar Total: 4 COWS:    PATIENT STRENGTHS: (choose at least two) Ability for insight Average or above average intelligence Capable of independent living Communication skills Financial means General fund of knowledge Motivation for treatment/growth Physical Health Supportive family/friends  Allergies:  Allergies  Allergen Reactions  . 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  . Gabapentin Other (See Comments)    Blisters in mouth   . Trazodone And Nefazodone Hypertension  . Codeine Itching, Nausea And Vomiting and Rash  . Eszopiclone Rash  . Lansoprazole Palpitations  . Motrin [Ibuprofen] Nausea And Vomiting  . Prilosec [Omeprazole] Palpitations and Other (See Comments)    Makes heart beat fast.    Home Medications:  (Not in a hospital admission)  OB/GYN Status:  No LMP recorded. Patient is postmenopausal.  General Assessment Data Location of Assessment: [facility] ED TTS Assessment: In system Is this a  Tele or Face-to-Face Assessment?: Tele Assessment Is this an Initial Assessment or a Re-assessment for this encounter?: Initial Assessment Marital status: Divorced [place] name: White Is patient pregnant?: No Pregnancy Status: No Living Arrangements: Alone Can pt return to current living arrangement?: Yes Admission Status: Voluntary Is patient capable of signing voluntary admission?: Yes Referral Source: Psychiatrist (Dr. Launa Grill) Insurance type: Medicare and [facility]     Crisis Care Plan Living Arrangements: Alone Legal Guardian: Other: (None) Name of Psychiatrist: Dr. Launa Grill Name of Therapist: None  Education Status Is patient currently in school?: No Current Grade: NA Highest grade of school patient has completed: 10 Name of school: NA Contact person: NA  Risk to  self with the past 6 months Suicidal Ideation: Yes-Currently Present Has patient been a risk to self within the past 6 months prior to admission? : Yes Suicidal Intent: No Has patient had any suicidal intent within the past 6 months prior to admission? : No Is patient at risk for suicide?: Yes Suicidal Plan?: No Has patient had any suicidal plan within the past 6 months prior to admission? : Yes (Pt has wished she could take alcohol and Ambien and not wake) Access to Means: Yes Specify Access to Suicidal Means: Pt has access to medications What has been your use of drugs/alcohol within the last 12 months?: Pt is abusing alcohol and Ambien Previous Attempts/Gestures: No How many times?: 0 Other Self Harm Risks: None identified Triggers for Past Attempts: None known Intentional Self Injurious Behavior: None Family Suicide History: Yes (Sister attempted suicide) Recent stressful life event(s): Other (Comment) (Distance from daughter and two grandchildren) Persecutory voices/beliefs?: No Depression: Yes Depression Symptoms: Despondent, Tearfulness, Isolating, Guilt, Loss of interest in usual pleasures, Feeling worthless/self pity Substance abuse history and/or treatment for substance abuse?: Yes Suicide prevention information given to non-admitted patients: Not applicable  Risk to Others within the past 6 months Homicidal Ideation: No Does patient have any lifetime risk of violence toward others beyond the six months prior to admission? : No Thoughts of Harm to Others: No Current Homicidal Intent: No Current Homicidal Plan: No Access to Homicidal Means: No Identified Victim: None History of harm to others?: No Assessment of Violence: None Noted Violent Behavior Description: Pt denies history of violence Does patient have access to weapons?: No Criminal Charges Pending?: No Does patient have a court date: No Is patient on probation?: No  Psychosis Hallucinations: None  noted Delusions: Persecutory (See assessment note)  Mental Status Report Appearance/Hygiene: Other (Comment) (Casually dressed) Eye Contact: Good Motor Activity: Unremarkable Speech: Logical/coherent Level of Consciousness: Alert Mood: Depressed, Anxious Affect: Depressed, Anxious Anxiety Level: Severe Thought Processes: Coherent, Relevant Judgement: Unimpaired Orientation: Person, Place, Time, Situation, Appropriate for developmental age Obsessive Compulsive Thoughts/Behaviors: None  Cognitive Functioning Concentration: Fair Memory: Recent Intact, Remote Intact IQ: Average Insight: Good Impulse Control: Fair Appetite: Good Weight Loss: 0 Weight Gain: 0 Sleep: No Change Total Hours of Sleep: 5 Vegetative Symptoms: None  ADLScreening Assurance [facility] Services) Patient's cognitive ability adequate to safely complete daily activities?: Yes Patient able to express need for assistance with ADLs?: Yes Independently performs ADLs?: Yes (appropriate for developmental age)  Prior Inpatient Therapy Prior Inpatient Therapy: No Prior Therapy Dates: NA Prior Therapy Facilty/Provider(s): NA Reason for Treatment: NA  Prior Outpatient Therapy Prior Outpatient Therapy: Yes Prior Therapy Dates: Current Prior Therapy Facilty/Provider(s): Dr. Launa Grill Reason for Treatment: Bipolar disorder, anxiety Does patient have an ACCT team?: No Does patient have Intensive In-House Services?  :  No Does patient have [facility] services? : No Does patient have P4CC services?: No  ADL Screening (condition at time of admission) Patient's cognitive ability adequate to safely complete daily activities?: Yes Is the patient deaf or have difficulty hearing?: No Does the patient have difficulty seeing, even when wearing glasses/contacts?: No Does the patient have difficulty concentrating, remembering, or making decisions?: No Patient able to express need for assistance with ADLs?: Yes Does the patient  have difficulty dressing or bathing?: No Independently performs ADLs?: Yes (appropriate for developmental age) Does the patient have difficulty walking or climbing stairs?: No Weakness of Legs: None Weakness of Arms/Hands: None  Home Assistive Devices/Equipment Home Assistive Devices/Equipment: Eyeglasses    Abuse/Neglect Assessment (Assessment to be complete while patient is alone) Physical Abuse: Denies Verbal Abuse: Denies Sexual Abuse: Denies Exploitation of patient/patient's resources: Denies Self-Neglect: Denies     Regulatory affairs officer (For Healthcare) Does patient have an advance directive?: No Would patient like information on creating an advanced directive?: No - patient declined information    Additional Information 1:1 In Past 12 Months?: No CIRT Risk: No Elopement Risk: No Does patient have medical clearance?: Yes     Disposition: Lavell Luster, AC at [facility], confirms bed availability. Gave clinical report to Arlester Marker, NP who said Pt meets criteria for inpatient psychiatric treatment and accepted Pt to the service of Dr. Carlton Adam, room 305-1. Notified Dr. Dorie Rank and RN at [facility] of acceptance.  Disposition Initial Assessment Completed for this Encounter: Yes Disposition of Patient: Inpatient treatment program Type of inpatient treatment program: Adult   Evelena Peat, [facility], [facility], [facility] Triage Specialist [phone]   Evelena Peat 03/24/2015 8:14 PM

## 2015-03-24 NOTE — Tx Team (Signed)
Initial Interdisciplinary Treatment Plan   PATIENT STRESSORS: Loss of best friend in 2016 Substance abuse   PATIENT STRENGTHS: Ability for insight Average or above average intelligence Capable of independent living Communication skills General fund of knowledge Motivation for treatment/growth Physical Health Religious Affiliation Special hobby/interest Supportive family/friends Work skills   PROBLEM LIST: Problem List/Patient Goals Date to be addressed Date deferred Reason deferred Estimated date of resolution  "I want detox" 03/24/15     "get healthier" 03/24/15           Substance abuse 03/24/15     Increased risk for suicide 03/24/15     Depression 03/24/15     Concurrent medical issues 03/24/15                  DISCHARGE CRITERIA:  Ability to meet basic life and health needs Adequate post-discharge living arrangements Improved stabilization in mood, thinking, and/or behavior Medical problems require only outpatient monitoring Motivation to continue treatment in a less acute level of care Need for constant or close observation no longer present Reduction of life-threatening or endangering symptoms to within safe limits Safe-care adequate arrangements made Verbal commitment to aftercare and medication compliance Withdrawal symptoms are absent or subacute and managed without 24-hour nursing intervention  PRELIMINARY DISCHARGE PLAN: Attend 12-step recovery group Participate in family therapy Return to previous living arrangement  PATIENT/FAMIILY INVOLVEMENT: This treatment plan has been presented to and reviewed with the patient, MATI GRISWELL, and/or family member.  The patient and family have been given the opportunity to ask questions and make suggestions.  Wynonia Hazard Laverne 03/24/2015, 11:03 PM

## 2015-03-24 NOTE — ED Notes (Signed)
TTS in progress at this time.  

## 2015-03-24 NOTE — BH Assessment (Signed)
Called to arrange tele-assessment. Per [facility] staff, Pt is currently in a hallway and will be moved to a room in Pod C in approximately 20 minutes.  Orpah Greek Anson Fret, LPC, [facility], [facility] Triage Specialist [phone]

## 2015-03-25 DIAGNOSIS — F411 Generalized anxiety disorder: Secondary | ICD-10-CM

## 2015-03-25 DIAGNOSIS — F10239 Alcohol dependence with withdrawal, unspecified: Secondary | ICD-10-CM

## 2015-03-25 DIAGNOSIS — F314 Bipolar disorder, current episode depressed, severe, without psychotic features: Principal | ICD-10-CM

## 2015-03-25 MED ORDER — CHLORDIAZEPOXIDE HCL 25 MG PO CAPS
ORAL_CAPSULE | ORAL | Status: AC
  Administered 2015-03-25: 09:00:00
  Filled 2015-03-25: qty 1

## 2015-03-25 MED ORDER — RAMELTEON 8 MG PO TABS
8.0000 mg | ORAL_TABLET | Freq: Once | ORAL | Status: AC
  Administered 2015-03-25: 8 mg via ORAL
  Filled 2015-03-25 (×2): qty 1

## 2015-03-25 MED ORDER — ONDANSETRON 4 MG PO TBDP
4.0000 mg | ORAL_TABLET | Freq: Four times a day (QID) | ORAL | Status: DC | PRN
  Administered 2015-03-25 (×2): 4 mg via ORAL
  Filled 2015-03-25 (×2): qty 1

## 2015-03-25 MED ORDER — ONDANSETRON 4 MG PO TBDP
ORAL_TABLET | ORAL | Status: AC
  Administered 2015-03-25: 4 mg
  Filled 2015-03-25: qty 1

## 2015-03-25 MED ORDER — ONDANSETRON 8 MG PO TBDP
8.0000 mg | ORAL_TABLET | Freq: Once | ORAL | Status: AC
  Administered 2015-03-25: 8 mg via ORAL
  Filled 2015-03-25: qty 1

## 2015-03-25 MED ORDER — CHLORDIAZEPOXIDE HCL 25 MG PO CAPS
25.0000 mg | ORAL_CAPSULE | Freq: Four times a day (QID) | ORAL | Status: AC | PRN
  Administered 2015-03-25: 25 mg via ORAL

## 2015-03-25 MED ORDER — VITAMIN B-1 100 MG PO TABS
100.0000 mg | ORAL_TABLET | Freq: Every day | ORAL | Status: DC
  Administered 2015-03-26 – 2015-03-28 (×3): 100 mg via ORAL
  Filled 2015-03-25 (×6): qty 1

## 2015-03-25 MED ORDER — CHLORDIAZEPOXIDE HCL 25 MG PO CAPS
25.0000 mg | ORAL_CAPSULE | ORAL | Status: AC
  Administered 2015-03-27 (×2): 25 mg via ORAL
  Filled 2015-03-25 (×2): qty 1

## 2015-03-25 MED ORDER — LOPERAMIDE HCL 2 MG PO CAPS: 2.0000 mg | ORAL_CAPSULE | ORAL | Status: AC | PRN

## 2015-03-25 MED ORDER — SUCRALFATE 1 GM/10ML PO SUSP
1.0000 g | Freq: Three times a day (TID) | ORAL | Status: AC
  Administered 2015-03-25 – 2015-03-26 (×7): 1 g via ORAL
  Filled 2015-03-25 (×7): qty 10

## 2015-03-25 MED ORDER — HYDROXYZINE HCL 25 MG PO TABS
25.0000 mg | ORAL_TABLET | Freq: Four times a day (QID) | ORAL | Status: AC | PRN
  Filled 2015-03-25: qty 1
  Filled 2015-03-25: qty 10

## 2015-03-25 MED ORDER — CHLORDIAZEPOXIDE HCL 25 MG PO CAPS
25.0000 mg | ORAL_CAPSULE | Freq: Four times a day (QID) | ORAL | Status: AC
  Administered 2015-03-25 (×3): 25 mg via ORAL
  Filled 2015-03-25 (×4): qty 1

## 2015-03-25 MED ORDER — ADULT MULTIVITAMIN W/MINERALS CH
1.0000 | ORAL_TABLET | Freq: Every day | ORAL | Status: DC
  Administered 2015-03-26 – 2015-03-28 (×3): 1 via ORAL
  Filled 2015-03-25 (×6): qty 1

## 2015-03-25 MED ORDER — CHLORDIAZEPOXIDE HCL 25 MG PO CAPS
25.0000 mg | ORAL_CAPSULE | Freq: Every day | ORAL | Status: AC
  Administered 2015-03-28: 25 mg via ORAL
  Filled 2015-03-25: qty 1

## 2015-03-25 MED ORDER — ONDANSETRON 4 MG PO TBDP
4.0000 mg | ORAL_TABLET | Freq: Four times a day (QID) | ORAL | Status: AC | PRN
  Administered 2015-03-26: 4 mg via ORAL
  Filled 2015-03-25: qty 1

## 2015-03-25 MED ORDER — CHLORDIAZEPOXIDE HCL 25 MG PO CAPS
25.0000 mg | ORAL_CAPSULE | Freq: Three times a day (TID) | ORAL | Status: AC
  Administered 2015-03-26 (×3): 25 mg via ORAL
  Filled 2015-03-25 (×3): qty 1

## 2015-03-25 NOTE — BHH Group Notes (Signed)
[facility] Group Notes:  (Nursing/MHT/Case Management/Adjunct)  Date:  03/25/2015  Time:  1315  Type of Therapy:  Nurse Education  /  Life Skills The group focuses on teaching patients how to identify their needs as well as how to develop healthy coping skills...used to get their needs met.  Participation Level:  Did Not Attend  Participation Quality:    Affect:    Cognitive:    Insight:    Engagement in Group:   Modes of Intervention:    Summary of Progress/Problems:  Tamara Mccullough, Limb 03/25/2015, 6:02 PM

## 2015-03-25 NOTE — Progress Notes (Signed)
D) pt having difficulty with detox. Has been having nausea and has been dry heaving. Affect is flat and mood depressed. Pt moved to tears easily. Rates her depression at a 10, hopelessness at a 10 and anxiety at an 8. States, "I feel terrible". Denies SI and HI. Reports feelings of depression  A) Given support, and reassurance along with praise. Encouragement given. R) Pt denies SI and HI.
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
## 2015-03-25 NOTE — BHH Suicide Risk Assessment (Signed)
[facility] Admission Suicide Risk Assessment   Nursing information obtained from:  Patient Demographic factors:  Divorced or widowed, Caucasian, Living alone, Unemployed Current Mental Status:  NA Loss Factors:  Loss of significant relationship, Decline in physical health Historical Factors:  Family history of mental illness or substance abuse Risk Reduction Factors:  Sense of responsibility to family, Positive social support  Total Time spent with patient: 1.5 hours Principal Problem: Bipolar disorder with severe depression ([place]) Diagnosis:   Patient Active Problem List   Diagnosis Date Noted  . Bipolar disorder with severe depression ([place]) [F31.4] 03/24/2015  . Left knee pain [M25.562] 10/31/2014  . Insomnia due to anxiety and fear [F51.05, F40.9] 12/20/2013  . Cervical stenosis of spinal canal [M48.02] 11/09/2013  . RLS (restless legs syndrome) [G25.81] 08/09/2013  . Multinodular goiter [E04.2] 06/09/2013  . Alcohol dependence ([place]) [F10.20] 03/15/2013  . Overdose of sleeping tabs [T42.71XA] 03/10/2013  . Essential hypertension, benign [I10] 05/20/2012  . GAD (generalized anxiety disorder) [F41.1] 05/20/2012  . Hyperlipidemia with target LDL less than 100 [E78.5] 05/20/2012  . Bipolar disorder ([place]) [F31.9] 05/20/2012  . Anemia, vitamin B12 deficiency [D51.9] 07/06/2009  . Obesity [E66.9] 06/29/2009  . PERSONAL HX COLONIC POLYPS [Z86.010] 06/29/2009   Subjective Data: Anxious and has nausea. Slept poorly. Mood dysphoric . Patient otherwise not aggressive but remains depressed.  Continued Clinical Symptoms:  Alcohol Use Disorder Identification Test Final Score (AUDIT): 36 The "Alcohol Use Disorders Identification Test", Guidelines for Use in Primary Care, Second Edition.  World Pharmacologist [facility]). Score between 0-7:  no or low risk or alcohol related problems. Score between 8-15:  moderate risk of alcohol related problems. Score between 16-19:  high risk of alcohol related  problems. Score 20 or above:  warrants further diagnostic evaluation for alcohol dependence and treatment.   CLINICAL FACTORS:   Severe Anxiety and/or Agitation Bipolar Disorder:   Depressive phase Alcohol/Substance Abuse/Dependencies More than one psychiatric diagnosis Unstable or Poor Therapeutic Relationship Previous Psychiatric Diagnoses and Treatments   Musculoskeletal: Strength & Muscle Tone: within normal limits Gait & Station: normal Patient leans: no lean  Psychiatric Specialty Exam: Review of Systems  Constitutional: Negative for fever.  Cardiovascular: Negative for chest pain.  Skin: Negative for rash.  Psychiatric/Behavioral: Positive for substance abuse. The patient is nervous/anxious and has insomnia.     Blood pressure 118/70, pulse 87, temperature 98.2 F (36.8 C), temperature source Oral, resp. rate 18, height 5' 2.5" (1.588 m), weight 104.327 kg (230 lb).Body mass index is 41.37 kg/(m^2).  General Appearance: Casual  Eye Contact::  Fair  Speech:  Slow  Volume:  Decreased  Mood:  Anxious  Affect:  Congruent  Thought Process:  Coherent  Orientation:  Full (Time, Place, and Person)  Thought Content:  Rumination  Suicidal Thoughts:  No  Homicidal Thoughts:  No  Memory:  Immediate;   Fair Recent;   Fair  Judgement:  Fair  Insight:  Shallow  Psychomotor Activity:  Normal  Concentration:  Fair  Recall:  [place]: Fair  Akathisia:  NA  Handed:  Right  AIMS (if indicated):     Assets:  Desire for Improvement  Sleep:  Number of Hours: 4.5  Cognition: WNL  ADL's:  Intact    COGNITIVE FEATURES THAT CONTRIBUTE TO RISK:  Closed-mindedness    SUICIDE RISK:   Mild:  Suicidal ideation of limited frequency, intensity, duration, and specificity.  There are no identifiable plans, no associated intent, mild dysphoria and  related symptoms, good self-control (both objective and subjective assessment), few other risk factors, and  identifiable protective factors, including available and accessible social support.  PLAN OF CARE: Admit for stabilization and detox. Supportive therapy and medication management.  I certify that inpatient services furnished can reasonably be expected to improve the patient's condition.   Merian Capron, MD 03/25/2015, 12:04 PM

## 2015-03-25 NOTE — H&P (Signed)
Psychiatric Admission Assessment Adult  Patient Identification: Tamara Mccullough MRN:  [id number] Date of Evaluation:  03/25/2015 Chief Complaint:  Bipolar Disorder Generalized Anxiety Disorder ETOH Use Disorder Ambien Use Disorder Severe Principal Diagnosis: Bipolar disorder with severe depression ([place]) Diagnosis:   Patient Active Problem List   Diagnosis Date Noted  . Bipolar disorder with severe depression ([place]) [F31.4] 03/24/2015  . Left knee pain [M25.562] 10/31/2014  . Insomnia due to anxiety and fear [F51.05, F40.9] 12/20/2013  . Cervical stenosis of spinal canal [M48.02] 11/09/2013  . RLS (restless legs syndrome) [G25.81] 08/09/2013  . Multinodular goiter [E04.2] 06/09/2013  . Alcohol dependence ([place]) [F10.20] 03/15/2013  . Overdose of sleeping tabs [T42.71XA] 03/10/2013  . Essential hypertension, benign [I10] 05/20/2012  . GAD (generalized anxiety disorder) [F41.1] 05/20/2012  . Hyperlipidemia with target LDL less than 100 [E78.5] 05/20/2012  . Bipolar disorder ([place]) [F31.9] 05/20/2012  . Anemia, vitamin B12 deficiency [D51.9] 07/06/2009  . Obesity [E66.9] 06/29/2009  . PERSONAL HX COLONIC POLYPS [Z86.010] 06/29/2009   History of Present Illness:   Tamara Mccullough is a divorced 60 y.o. female who presented unaccompanied to [facility] ED after being referred for assessment by her psychiatrist, Dr. Launa Grill.  Patient stated that she had hx of bipolar disorder and anxiety. She says she had a panic attack today stating to Dr Reece Levy that her depression worsened and that she was overusing Ambien and liquor.  She reports being at [place] [facility] after an overdose of Ambien.  Patient stated that her primary stressor was being "mentally ill, retired, living alone and my daughter is far living in [place]."  Per chart review, patient is currently receiving outpatient medication management with Dr. Reece Levy. She denies any history of inpatient psychiatric or substance abuse treatment. Pt was in  chemical dependency IOP at [place] in February 2015.   Today patient was seen, cooperative and pleasant.  Denies SI HI AVH.  States she is detoxing here at [facility].  Reports that she is sleeping ok.  Rates her depression 8/10.    Associated Signs/Symptoms: Depression Symptoms:  depressed mood, (Hypo) Manic Symptoms:  Irritable Mood, Labiality of Mood, Anxiety Symptoms:  Excessive Worry, Psychotic Symptoms:  NA PTSD Symptoms: NA Total Time spent with patient: 30 minutes  Past Psychiatric History: see above noted  Is the patient at risk to self? Yes.    Has the patient been a risk to self in the past 6 months? Yes.    Has the patient been a risk to self within the distant past? Yes.    Is the patient a risk to others? Yes.    Has the patient been a risk to others in the past 6 months? Yes.    Has the patient been a risk to others within the distant past? Yes.     Prior Inpatient Therapy:   Prior Outpatient Therapy:    Alcohol Screening: 1. How often do you have a drink containing alcohol?: 4 or more times a week 2. How many drinks containing alcohol do you have on a typical day when you are drinking?: 10 or more 3. How often do you have six or more drinks on one occasion?: Daily or almost daily Preliminary Score: 8 4. How often during the last year have you found that you were not able to stop drinking once you had started?: Daily or almost daily 5. How often during the last year have you failed to do what was normally expected from you becasue of drinking?:  Daily or almost daily 6. How often during the last year have you needed a first drink in the morning to get yourself going after a heavy drinking session?: Daily or almost daily 7. How often during the last year have you had a feeling of guilt of remorse after drinking?: Daily or almost daily 8. How often during the last year have you been unable to remember what happened the night before because you had been drinking?: Daily or  almost daily 9. Have you or someone else been injured as a result of your drinking?: No 10. Has a relative or friend or a doctor or another health worker been concerned about your drinking or suggested you cut down?: Yes, during the last year Alcohol Use Disorder Identification Test Final Score (AUDIT): 36 Substance Abuse History in the last 12 months:  Yes.   Consequences of Substance Abuse: NA Previous Psychotropic Medications: Yes  Psychological Evaluations: Yes  Past Medical History:  Past Medical History  Diagnosis Date  . Anemia   . Depression   . Depression   . Morbid obesity ([place])   . Edema   . Hypertension   . Chronic back pain   . Peripheral neuropathy ([place])   . Hyperlipidemia   . Panic attacks   . Insomnia   . Restless leg syndrome   . PONV (postoperative nausea and vomiting)   . Sleep apnea     has not used CPAP in 3 years   . Asthma     slight  . Arthritis     back   . Seizures ([place])     hx of seizure 05/2013     Past Surgical History  Procedure Laterality Date  . 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 Tubal ligation    . Back surgery    . Thyroid lobectomy Right 08/12/2013    Procedure: RIGHT THYROID LOBECTOMY;  Surgeon: Odis Hollingshead, MD;  Location: [facility] ORS;  Service: General;  Laterality: Right;  . Minor hemorrhoidectomy    . Ctr    . Anterior cervical decomp/discectomy fusion N/A 11/09/2013    Procedure:  Anterior cervical decompression/diskectomy/fusion cervical four-five ,cervical five-six,cervical six-seven;  Surgeon: Floyce Stakes, MD;  Location: [facility] ORS;  Service: Neurosurgery;  Laterality: N/A;   Family History:  Family History  Problem Relation Age of Onset  . Congestive Heart Failure Mother   . Diabetes Mother   . Cancer Mother     cervical  . Congestive Heart Failure Father   . Emphysema Father   . Alcohol abuse Father   . Alcohol abuse Sister   . Alcohol abuse Brother   . Alcohol abuse Paternal Grandfather   . Alcohol abuse Brother   .  Alcohol abuse Brother    Family Psychiatric  History: Denied Tobacco Screening: smokes daily Social History:  History  Alcohol Use  . Yes    Comment: 3/4 of 5th liquor daily     History  Drug Use  . Yes  . Special: Benzodiazepines    Additional Social History:      Pain Medications: see mar Prescriptions: see mar Over the Counter: see mar History of alcohol / drug use?: Yes Longest period of sobriety (when/how long): 20 yrs from 1996 to 2015 Negative Consequences of Use: Personal relationships Withdrawal Symptoms: Tremors, Diarrhea, Nausea / Vomiting, Anorexia Name of Substance 1: same as below Name of Substance 2: same as below  Allergies:   Allergies  Allergen Reactions  . 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  . Gabapentin Other (See Comments)    Blisters in mouth   . Trazodone And Nefazodone Hypertension  . Codeine Itching, Nausea And Vomiting and Rash  . Eszopiclone Rash  . Lansoprazole Palpitations  . Motrin [Ibuprofen] Nausea And Vomiting  . Prilosec [Omeprazole] Palpitations and Other (See Comments)    Makes heart beat fast.   Lab Results:  Results for orders placed or performed during the hospital encounter of 03/24/15 (from the past 48 hour(s))  Comprehensive metabolic panel     Status: Abnormal   Collection Time: 03/24/15  5:14 PM  Result Value Ref Range   Sodium 141 135 - 145 mmol/L   Potassium 3.7 3.5 - 5.1 mmol/L   Chloride 102 101 - 111 mmol/L   CO2 23 22 - 32 mmol/L   Glucose, Bld 115 (H) 65 - 99 mg/dL   BUN 14 6 - 20 mg/dL   Creatinine, Ser 0.84 0.44 - 1.00 mg/dL   Calcium 8.6 (L) 8.9 - 10.3 mg/dL   Total Protein 6.7 6.5 - 8.1 g/dL   Albumin 4.0 3.5 - 5.0 g/dL   AST 31 15 - 41 U/L   ALT 24 14 - 54 U/L   Alkaline Phosphatase 92 38 - 126 U/L   Total Bilirubin 1.3 (H) 0.3 - 1.2 mg/dL   GFR calc non Af Amer >60 >60 mL/min   GFR calc Af Amer >60 >60 mL/min     Comment: (NOTE) The eGFR has been calculated using the CKD EPI equation. This calculation has not been validated in all clinical situations. eGFR's persistently <60 mL/min signify possible Chronic Kidney Disease.    Anion gap 16 (H) 5 - 15  Ethanol     Status: None   Collection Time: 03/24/15  5:14 PM  Result Value Ref Range   Alcohol, Ethyl (B) <5 <5 mg/dL    Comment:        LOWEST DETECTABLE LIMIT FOR SERUM ALCOHOL IS 5 mg/dL FOR MEDICAL PURPOSES ONLY   CBC with Diff     Status: None   Collection Time: 03/24/15  5:14 PM  Result Value Ref Range   WBC 7.4 4.0 - 10.5 K/uL   RBC 5.01 3.87 - 5.11 MIL/uL   Hemoglobin 15.0 12.0 - 15.0 g/dL   HCT 45.9 36.0 - 46.0 %   MCV 91.6 78.0 - 100.0 fL   MCH 29.9 26.0 - 34.0 pg   MCHC 32.7 30.0 - 36.0 g/dL   RDW 15.5 11.5 - 15.5 %   Platelets 202 150 - 400 K/uL   Neutrophils Relative % 75 %   Neutro Abs 5.6 1.7 - 7.7 K/uL   Lymphocytes Relative 16 %   Lymphs Abs 1.2 0.7 - 4.0 K/uL   Monocytes Relative 8 %   Monocytes Absolute 0.6 0.1 - 1.0 K/uL   Eosinophils Relative 0 %   Eosinophils Absolute 0.0 0.0 - 0.7 K/uL   Basophils Relative 1 %   Basophils Absolute 0.0 0.0 - 0.1 K/uL  Urine rapid drug screen (hosp performed)not at [facility]     Status: Abnormal   Collection Time: 03/24/15  7:27 PM  Result Value Ref Range   Opiates NONE DETECTED NONE DETECTED   Cocaine NONE DETECTED NONE DETECTED   Benzodiazepines POSITIVE (A) NONE DETECTED   Amphetamines NONE DETECTED NONE DETECTED   Tetrahydrocannabinol NONE DETECTED  NONE DETECTED   Barbiturates NONE DETECTED NONE DETECTED    Comment:        DRUG SCREEN FOR MEDICAL PURPOSES ONLY.  IF CONFIRMATION IS NEEDED FOR ANY PURPOSE, NOTIFY LAB WITHIN 5 DAYS.        LOWEST DETECTABLE LIMITS FOR URINE DRUG SCREEN Drug Class       Cutoff (ng/mL) Amphetamine      1000 Barbiturate      200 Benzodiazepine   161 Tricyclics       096 Opiates          300 Cocaine          300 THC              50      Blood Alcohol level:  Lab Results  Component Value Date   [facility] <5 03/24/2015   ETH <11 04/54/0981    Metabolic Disorder Labs:  Lab Results  Component Value Date   HGBA1C 5.0 01/03/2015   No results found for: PROLACTIN Lab Results  Component Value Date   CHOL 155 09/30/2014   TRIG 93 09/30/2014   HDL 74 09/30/2014   CHOLHDL 2.1 09/30/2014   LDLCALC 62 09/30/2014   LDLCALC 61 08/09/2013    Current Medications: Current Facility-Administered Medications  Medication Dose Route Frequency Provider Last Rate Last Dose  . acetaminophen (TYLENOL) tablet 650 mg  650 mg Oral Q6H PRN Harriet Butte, NP   650 mg at 03/25/15 1114  . alum & mag hydroxide-simeth (MAALOX/MYLANTA) 200-200-20 MG/5ML suspension 30 mL  30 mL Oral Q4H PRN Harriet Butte, NP      . busPIRone (BUSPAR) tablet 20 mg  20 mg Oral BID Harriet Butte, NP   20 mg at 03/25/15 0636  . chlordiazePOXIDE (LIBRIUM) capsule 25 mg  25 mg Oral Q6H PRN Merian Capron, MD   25 mg at 03/25/15 1114  . chlordiazePOXIDE (LIBRIUM) capsule 25 mg  25 mg Oral QID Merian Capron, MD   25 mg at 03/25/15 1507   Followed by  . [START ON 03/26/2015] chlordiazePOXIDE (LIBRIUM) capsule 25 mg  25 mg Oral TID Merian Capron, MD       Followed by  . [START ON 03/27/2015] chlordiazePOXIDE (LIBRIUM) capsule 25 mg  25 mg Oral BH-qamhs Merian Capron, MD       Followed by  . [START ON 03/28/2015] chlordiazePOXIDE (LIBRIUM) capsule 25 mg  25 mg Oral Daily Merian Capron, MD      . FLUoxetine (PROZAC) capsule 60 mg  60 mg Oral q morning - 10a Harriet Butte, NP   60 mg at 03/25/15 0854  . hydrochlorothiazide (MICROZIDE) capsule 12.5 mg  12.5 mg Oral Daily Nicholaus Bloom, MD   12.5 mg at 03/25/15 0855  . hydrOXYzine (ATARAX/VISTARIL) tablet 25 mg  25 mg Oral Q6H PRN Merian Capron, MD      . lamoTRIgine (LAMICTAL) tablet 150 mg  150 mg Oral q morning - 10a Harriet Butte, NP   150 mg at 03/25/15 0854  . lisinopril (PRINIVIL,ZESTRIL) tablet 10 mg  10 mg Oral  Daily Nicholaus Bloom, MD   10 mg at 03/25/15 0855  . loperamide (IMODIUM) capsule 2-4 mg  2-4 mg Oral PRN Merian Capron, MD      . magnesium hydroxide (MILK OF MAGNESIA) suspension 30 mL  30 mL Oral Daily PRN Harriet Butte, NP      . multivitamin with minerals tablet 1 tablet  1 tablet Oral  Daily Merian Capron, MD   1 tablet at 03/25/15 0900  . ondansetron (ZOFRAN-ODT) disintegrating tablet 4 mg  4 mg Oral Q6H PRN Merian Capron, MD      . sucralfate (CARAFATE) 1 GM/10ML suspension 1 g  1 g Oral TID WC & HS Kerrie Buffalo, NP   1 g at 03/25/15 1138  . thiamine (VITAMIN B-1) tablet 100 mg  100 mg Oral Daily Merian Capron, MD   100 mg at 03/25/15 0900   PTA Medications: Prescriptions prior to admission  Medication Sig Dispense Refill Last Dose  . busPIRone (BUSPAR) 10 MG tablet Take 2 tablets (20 mg total) by mouth 2 (two) times daily. 120 tablet 2 03/24/2015 at Unknown time  . diclofenac sodium (VOLTAREN) 1 % GEL Apply 2 g topically 3 (three) times daily.   Past Week at Unknown time  . FLUoxetine (PROZAC) 20 MG tablet Take 60 mg by mouth every morning.    03/24/2015 at Unknown time  . hydrOXYzine (ATARAX/VISTARIL) 10 MG tablet Take 10 mg by mouth 3 (three) times daily.   03/24/2015 at Unknown time  . lamoTRIgine (LAMICTAL) 150 MG tablet Take 150 mg by mouth every morning.   03/24/2015 at Unknown time  . lisinopril-hydrochlorothiazide (PRINZIDE,ZESTORETIC) 10-12.5 MG per tablet Take 1 tablet by mouth every morning. 90 tablet 1 03/24/2015 at Unknown time  . zolpidem (AMBIEN) 10 MG tablet Take 20 mg by mouth at bedtime. Take every night per patient   03/23/2015 at Unknown time  . nystatin cream (MYCOSTATIN) APPLY 1 APPLICATION TOPICALLY 2 (TWO) TIMES DAILY. (Patient not taking: Reported on 03/24/2015) 30 g 1 Not Taking at Unknown time  . rosuvastatin (CRESTOR) 5 MG tablet Take 1 tablet (5 mg total) by mouth daily. 90 tablet 3 03/23/2015 at Unknown time  . traMADol (ULTRAM) 50 MG tablet Reported on 03/24/2015   0 Not Taking at Unknown time    Musculoskeletal: Strength & Muscle Tone: within normal limits Gait & Station: normal Patient leans: N/A  Psychiatric Specialty Exam: Physical Exam  Vitals reviewed.   Review of Systems  All other systems reviewed and are negative.   Blood pressure 129/66, pulse 64, temperature 98.2 F (36.8 C), temperature source Oral, resp. rate 18, height 5' 2.5" (1.588 m), weight 104.327 kg (230 lb).Body mass index is 41.37 kg/(m^2).  General Appearance: Disheveled  Eye Contact::  Good  Speech:  Clear and Coherent  Volume:  Normal  Mood:  Depressed  Affect:  Flat  Thought Process:  Coherent  Orientation:  Full (Time, Place, and Person)  Thought Content:  Rumination  Suicidal Thoughts:  No  Homicidal Thoughts:  No  Memory:  Immediate;   Fair Recent;   Fair Remote;   Fair  Judgement:  Fair  Insight:  Good  Psychomotor Activity:  Normal  Concentration:  Good  Recall:  Good  Fund of Knowledge:Good  Language: Good  Akathisia:  Negative  Handed:  Right  AIMS (if indicated):     Assets:  Communication Skills  ADL's:  Intact  Cognition: WNL  Sleep:  Number of Hours: 4.5   Treatment Plan Summary: Admit for crisis management and mood stabilization. Medication management to re-stabilize current mood symptoms.  Buspar 20 mg BID anxiety, Librium CIWA protocol, Prozac 60 mg daily depression, Lamictal 150 mg daily mood stabilization. Group counseling sessions for coping skills Medical consults as needed Review and reinstate any pertinent home medications for other health problems  Observation Level/Precautions:  15 minute checks  Laboratory:  per  ED  Psychotherapy:  Group milieu  Medications:  As per medlist  Consultations:  As needed  Discharge Concerns:  safety  Estimated LOS:  5-7 days  Other:     I certify that inpatient services furnished can reasonably be expected to improve the patient's condition.    Janett Labella, NP [facility] 2/18/20174:22  PM I have examined the patient and agreed with the findings of H&P and treatment plan. I have also done suicide assessment on this patient.

## 2015-03-25 NOTE — Progress Notes (Signed)
Pt was pleasant and cooperative during the adm process. When asked the circumstances surrounding her adm pt stated, "get help with my drinking and drug use", referring to [place]. Pt states she drinks 3/4 litre of liquor daily for the past few months. States she was sober 20 yrs prior to 6 months ago. States her last drink was 0700 this morning, however, bal was 5<.  the patient complained of nausea and tremors. Pt has no questions or concerns.

## 2015-03-25 NOTE — BHH Group Notes (Signed)
[place] Group Notes:  (Clinical Social Work)   12/03/2014     10:00-11:00AM  Summary of Progress/Problems:   In today's process group patients listed needs that human beings have, then listed healthy and unhealthy coping techniques, determining that unhealthy coping techniques work initially, but eventually become harmful.  There was then a discussion of various ideas of healthy coping techniques and how to go about switching from unhealthy to healthy choices.  Motivational Interviewing and the whiteboard were utilized for the exercises.  The patient expressed that the unhealthy coping she often uses is alcohol to deal with her loneliness.  She cried throughout group and did not participate further.  Type of Therapy:  Group Therapy - Process   Participation Level:  Minimal  Participation Quality:  Present  Affect:  Depressed, Flat and Tearful  Cognitive:  Disorganized  Insight:  Limited  Engagement in Therapy:  Limited  Modes of Intervention:  Education, Motivational Interviewing  Selmer Dominion, LCSW 03/25/2015, 12:17 PM

## 2015-03-26 DIAGNOSIS — F1020 Alcohol dependence, uncomplicated: Secondary | ICD-10-CM | POA: Insufficient documentation

## 2015-03-26 MED ORDER — HYDROXYZINE HCL 50 MG PO TABS
50.0000 mg | ORAL_TABLET | Freq: Every evening | ORAL | Status: DC | PRN
  Administered 2015-03-26 – 2015-03-27 (×3): 50 mg via ORAL
  Filled 2015-03-26 (×9): qty 1

## 2015-03-26 NOTE — Plan of Care (Signed)
Problem: Alteration in mood & ability to function due to Goal: STG-Pt will be introduced to the 12-step program of recovery (Patient will be introduced to the 12-step program of recovery and disease concept of addiction)  Outcome: Progressing Patient attended [place] meeting tonight.

## 2015-03-26 NOTE — Progress Notes (Addendum)
[facility] MD Progress Note  03/26/2015 12:24 PM Tamara Mccullough  MRN:  [id number] Subjective:  Tamara Mccullough is a divorced 60 y.o. female who initially  presented unaccompanied to [facility] after being referred for assessment by her psychiatrist, Dr. Launa Grill. Patient stated that she had hx of bipolar disorder and anxiety. She says she had a panic attack today stating to Dr Reece Levy that her depression worsened and that she was overusing Ambien and liquor. She reports being at [facility] after an overdose of Ambien.  On evaluation today mood is better, tolerating medication. Less nausea . Affect improved and more attending the groups  Principal Problem: Bipolar disorder with severe depression ([place]) Diagnosis:   Patient Active Problem List   Diagnosis Date Noted  . Bipolar disorder with severe depression ([place]) [F31.4] 03/24/2015  . Left knee pain [M25.562] 10/31/2014  . Insomnia due to anxiety and fear [F51.05, F40.9] 12/20/2013  . Cervical stenosis of spinal canal [M48.02] 11/09/2013  . RLS (restless legs syndrome) [G25.81] 08/09/2013  . Multinodular goiter [E04.2] 06/09/2013  . Alcohol dependence ([place]) [F10.20] 03/15/2013  . Overdose of sleeping tabs [T42.71XA] 03/10/2013  . Essential hypertension, benign [I10] 05/20/2012  . GAD (generalized anxiety disorder) [F41.1] 05/20/2012  . Hyperlipidemia with target LDL less than 100 [E78.5] 05/20/2012  . Bipolar disorder ([place]) [F31.9] 05/20/2012  . Anemia, vitamin B12 deficiency [D51.9] 07/06/2009  . Obesity [E66.9] 06/29/2009  . PERSONAL HX COLONIC POLYPS [Z86.010] 06/29/2009   Total Time spent with patient: 30 minutes  Past Psychiatric History: depression   Past Medical History:  Past Medical History  Diagnosis Date  . Anemia   . Depression   . Depression   . Morbid obesity ([place])   . Edema   . Hypertension   . Chronic back pain   . Peripheral neuropathy ([place])   . Hyperlipidemia   . Panic attacks   . Insomnia   . Restless leg  syndrome   . PONV (postoperative nausea and vomiting)   . Sleep apnea     has not used CPAP in 3 years   . Asthma     slight  . Arthritis     back   . Seizures ([place])     hx of seizure 05/2013     Past Surgical History  Procedure Laterality Date  . 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 Tubal ligation    . Back surgery    . Thyroid lobectomy Right 08/12/2013    Procedure: RIGHT THYROID LOBECTOMY;  Surgeon: Odis Hollingshead, MD;  Location: [facility] ORS;  Service: General;  Laterality: Right;  . Minor hemorrhoidectomy    . Ctr    . Anterior cervical decomp/discectomy fusion N/A 11/09/2013    Procedure:  Anterior cervical decompression/diskectomy/fusion cervical four-five ,cervical five-six,cervical six-seven;  Surgeon: Floyce Stakes, MD;  Location: [facility];  Service: Neurosurgery;  Laterality: N/A;   Family History:  Family History  Problem Relation Age of Onset  . Congestive Heart Failure Mother   . Diabetes Mother   . Cancer Mother     cervical  . Congestive Heart Failure Father   . Emphysema Father   . Alcohol abuse Father   . Alcohol abuse Sister   . Alcohol abuse Brother   . Alcohol abuse Paternal Grandfather   . Alcohol abuse Brother   . Alcohol abuse Brother    Family Psychiatric  History:  Social History:  History  Alcohol Use  . Yes    Comment:  3/4 of 5th liquor daily     History  Drug Use  . Yes  . Special: Benzodiazepines    Social History   Social History  . Marital Status: Divorced    Spouse Name: N/A  . Number of Children: N/A  . Years of Education: N/A   Social History Main Topics  . Smoking status: Former Smoker    Quit date: 02/05/1996  . Smokeless tobacco: Never Used  . Alcohol Use: Yes     Comment: 3/4 of 5th liquor daily  . Drug Use: Yes    Special: Benzodiazepines  . Sexual Activity: No   Other Topics Concern  . None   Social History Narrative   Additional Social History:    Pain Medications: see mar Prescriptions: see mar Over the  Counter: see mar History of alcohol / drug use?: Yes Longest period of sobriety (when/how long): 20 yrs from 1996 to 2015 Negative Consequences of Use: Personal relationships Withdrawal Symptoms: Tremors, Diarrhea, Nausea / Vomiting, Anorexia Name of Substance 1: same as below Name of Substance 2: same as below                Sleep: Fair  Appetite:  Fair  Current Medications: Current Facility-Administered Medications  Medication Dose Route Frequency Provider Last Rate Last Dose  . acetaminophen (TYLENOL) tablet 650 mg  650 mg Oral Q6H PRN Harriet Butte, NP   650 mg at 03/25/15 1114  . alum & mag hydroxide-simeth (MAALOX/MYLANTA) 200-200-20 MG/5ML suspension 30 mL  30 mL Oral Q4H PRN Harriet Butte, NP      . busPIRone (BUSPAR) tablet 20 mg  20 mg Oral BID Harriet Butte, NP   20 mg at 03/26/15 0900  . chlordiazePOXIDE (LIBRIUM) capsule 25 mg  25 mg Oral Q6H PRN Merian Capron, MD   25 mg at 03/25/15 1114  . chlordiazePOXIDE (LIBRIUM) capsule 25 mg  25 mg Oral TID Merian Capron, MD   25 mg at 03/26/15 1210   Followed by  . [START ON 03/27/2015] chlordiazePOXIDE (LIBRIUM) capsule 25 mg  25 mg Oral BH-qamhs Merian Capron, MD       Followed by  . [START ON 03/28/2015] chlordiazePOXIDE (LIBRIUM) capsule 25 mg  25 mg Oral Daily Merian Capron, MD      . FLUoxetine (PROZAC) capsule 60 mg  60 mg Oral q morning - 10a Harriet Butte, NP   60 mg at 03/26/15 0859  . hydrochlorothiazide (MICROZIDE) capsule 12.5 mg  12.5 mg Oral Daily Nicholaus Bloom, MD   12.5 mg at 03/26/15 0900  . hydrOXYzine (ATARAX/VISTARIL) tablet 25 mg  25 mg Oral Q6H PRN Merian Capron, MD      . lamoTRIgine (LAMICTAL) tablet 150 mg  150 mg Oral q morning - 10a Harriet Butte, NP   150 mg at 03/26/15 0900  . lisinopril (PRINIVIL,ZESTRIL) tablet 10 mg  10 mg Oral Daily Nicholaus Bloom, MD   10 mg at 03/26/15 0900  . loperamide (IMODIUM) capsule 2-4 mg  2-4 mg Oral PRN Merian Capron, MD      . magnesium hydroxide (MILK OF  MAGNESIA) suspension 30 mL  30 mL Oral Daily PRN Harriet Butte, NP      . multivitamin with minerals tablet 1 tablet  1 tablet Oral Daily Merian Capron, MD   1 tablet at 03/26/15 0900  . ondansetron (ZOFRAN-ODT) disintegrating tablet 4 mg  4 mg Oral Q6H PRN Merian Capron, MD   4 mg  at 03/26/15 0616  . sucralfate (CARAFATE) 1 GM/10ML suspension 1 g  1 g Oral TID WC & HS Kerrie Buffalo, NP   1 g at 03/26/15 1210  . thiamine (VITAMIN B-1) tablet 100 mg  100 mg Oral Daily Merian Capron, MD   100 mg at 03/26/15 0859    Lab Results:  Results for orders placed or performed during the hospital encounter of 03/24/15 (from the past 48 hour(s))  Comprehensive metabolic panel     Status: Abnormal   Collection Time: 03/24/15  5:14 PM  Result Value Ref Range   Sodium 141 135 - 145 mmol/L   Potassium 3.7 3.5 - 5.1 mmol/L   Chloride 102 101 - 111 mmol/L   CO2 23 22 - 32 mmol/L   Glucose, Bld 115 (H) 65 - 99 mg/dL   BUN 14 6 - 20 mg/dL   Creatinine, Ser 0.84 0.44 - 1.00 mg/dL   Calcium 8.6 (L) 8.9 - 10.3 mg/dL   Total Protein 6.7 6.5 - 8.1 g/dL   Albumin 4.0 3.5 - 5.0 g/dL   AST 31 15 - 41 U/L   ALT 24 14 - 54 U/L   Alkaline Phosphatase 92 38 - 126 U/L   Total Bilirubin 1.3 (H) 0.3 - 1.2 mg/dL   GFR calc non Af Amer >60 >60 mL/min   GFR calc Af Amer >60 >60 mL/min    Comment: (NOTE) The eGFR has been calculated using the CKD EPI equation. This calculation has not been validated in all clinical situations. eGFR's persistently <60 mL/min signify possible Chronic Kidney Disease.    Anion gap 16 (H) 5 - 15  Ethanol     Status: None   Collection Time: 03/24/15  5:14 PM  Result Value Ref Range   Alcohol, Ethyl (B) <5 <5 mg/dL    Comment:        LOWEST DETECTABLE LIMIT FOR SERUM ALCOHOL IS 5 mg/dL FOR MEDICAL PURPOSES ONLY   CBC with Diff     Status: None   Collection Time: 03/24/15  5:14 PM  Result Value Ref Range   WBC 7.4 4.0 - 10.5 K/uL   RBC 5.01 3.87 - 5.11 MIL/uL   Hemoglobin 15.0  12.0 - 15.0 g/dL   HCT 45.9 36.0 - 46.0 %   MCV 91.6 78.0 - 100.0 fL   MCH 29.9 26.0 - 34.0 pg   MCHC 32.7 30.0 - 36.0 g/dL   RDW 15.5 11.5 - 15.5 %   Platelets 202 150 - 400 K/uL   Neutrophils Relative % 75 %   Neutro Abs 5.6 1.7 - 7.7 K/uL   Lymphocytes Relative 16 %   Lymphs Abs 1.2 0.7 - 4.0 K/uL   Monocytes Relative 8 %   Monocytes Absolute 0.6 0.1 - 1.0 K/uL   Eosinophils Relative 0 %   Eosinophils Absolute 0.0 0.0 - 0.7 K/uL   Basophils Relative 1 %   Basophils Absolute 0.0 0.0 - 0.1 K/uL  Urine rapid drug screen (hosp performed)not at [facility]     Status: Abnormal   Collection Time: 03/24/15  7:27 PM  Result Value Ref Range   Opiates NONE DETECTED NONE DETECTED   Cocaine NONE DETECTED NONE DETECTED   Benzodiazepines POSITIVE (A) NONE DETECTED   Amphetamines NONE DETECTED NONE DETECTED   Tetrahydrocannabinol NONE DETECTED NONE DETECTED   Barbiturates NONE DETECTED NONE DETECTED    Comment:        DRUG SCREEN FOR MEDICAL PURPOSES ONLY.  IF CONFIRMATION IS NEEDED  FOR ANY PURPOSE, NOTIFY LAB WITHIN 5 DAYS.        LOWEST DETECTABLE LIMITS FOR URINE DRUG SCREEN Drug Class       Cutoff (ng/mL) Amphetamine      1000 Barbiturate      200 Benzodiazepine   272 Tricyclics       536 Opiates          300 Cocaine          300 THC              50     Blood Alcohol level:  Lab Results  Component Value Date   ETH <5 03/24/2015   ETH <11 03/04/2013    Physical Findings: AIMS: Facial and Oral Movements Muscles of Facial Expression: None, normal Lips and Perioral Area: None, normal Jaw: None, normal Tongue: None, normal,Extremity Movements Upper (arms, wrists, hands, fingers): None, normal Lower (legs, knees, ankles, toes): None, normal, Trunk Movements Neck, shoulders, hips: None, normal, Overall Severity Severity of abnormal movements (highest score from questions above): None, normal Incapacitation due to abnormal movements: None, normal Patient's awareness of  abnormal movements (rate only patient's report): No Awareness, Dental Status Current problems with teeth and/or dentures?: No Does patient usually wear dentures?: No  CIWA:  CIWA-Ar Total: 4 COWS:     Musculoskeletal: Strength & Muscle Tone: within normal limits Gait & Station: normal Patient leans: N/A  Psychiatric Specialty Exam: Review of Systems  Constitutional: Negative for fever.  Cardiovascular: Negative for chest pain.  Skin: Negative for rash.  Neurological: Negative for tremors.  Psychiatric/Behavioral: Positive for depression.    Blood pressure 128/68, pulse 58, temperature 97.6 F (36.4 C), temperature source Oral, resp. rate 16, height 5' 2.5" (1.588 m), weight 104.327 kg (230 lb).Body mass index is 41.37 kg/(m^2).  General Appearance: Casual  Eye Contact::  Fair  Speech:  Clear and Coherent  Volume:  Normal  Mood:  Dysphoric  Affect:  Constricted  Thought Process:  Coherent  Orientation:  Full (Time, Place, and Person)  Thought Content:  Rumination  Suicidal Thoughts:  No  Homicidal Thoughts:  No  Memory:  Immediate;   Fair Recent;   Fair  Judgement:  Fair  Insight:  Shallow  Psychomotor Activity:  Normal  Concentration:  Fair  Recall:  [employer] of Knowledge:Fair  Language: Fair  Akathisia:  Negative  Handed:  Right  AIMS (if indicated):     Assets:  Desire for Improvement  ADL's:  Intact  Cognition: WNL  Sleep:  Number of Hours: 4.25   Treatment Plan Summary: Daily contact with patient to assess and evaluate symptoms and progress in treatment, Medication management and Plan as follows  Alcohol use disorder; continue detox protocol Bipolar/Depression; continue lamictal/prozac CBT and monitor vitals.  Merian Capron, MD 03/26/2015, 12:24 PM

## 2015-03-26 NOTE — Progress Notes (Signed)
D) Pt has been up in the milieu attending the program and interacting with her peers. Pt wrote on her survey this morning "You have me, heart, mind and soul". Pt verbalize feelings of shame over being here and then immediately stated "I am not going to beat myself up over this. I don't like the fact that I drank, but I am not going to let it destroy me".Pt rates her depression at a 7, hopelessness at a 0 and her anxiety at a 7. Denies SI and HI  A) given support, reassurance and praise. Encouraged to focus on the here and now and not on her recent drinking. Provided with a 1:1.  R) Pt states, "it feels good to be able to smile again".

## 2015-03-26 NOTE — BHH Group Notes (Addendum)
[facility] Group Notes:  (Nursing/MHT/Case Management/Adjunct)  Date:  03/26/2015  Time:  1315  Type of Therapy:  Nurse Education  /  Life SKills : The group focuses on teaching patients how to get their needs met as well as identify the healthy coping skills needed to get their needs met.  Participation Level:  Active  Participation Quality:  Appropriate  Affect:  Anxious  Cognitive:  Alert  Insight:  Good  Engagement in Group:  Engaged  Modes of Intervention:  Discussion and Education  Summary of Progress/Problems:  Tamara Mccullough, Tamara Mccullough 03/26/2015, 4:41 PM

## 2015-03-26 NOTE — Progress Notes (Signed)
D) Pt shared with this writer that she had fallen prior to coming to the hospital the day before. "I fell at home. I feel a little uncomfortable but not as bad as I did last night. A) Pt's fall risk increased. Pt was already a fall risk, number just increased. R) Pt remains safe. Instructed about ambulating safely.

## 2015-03-26 NOTE — Progress Notes (Signed)
Patient did attend the evening speaker AA meeting.  

## 2015-03-26 NOTE — Progress Notes (Signed)
The patient attended last evening's A.A. Meeting and was appropriate.  

## 2015-03-26 NOTE — Plan of Care (Signed)
Problem: Alteration in mood & ability to function due to Goal: STG-Patient will attend groups Outcome: Progressing Patient attended [place] meeting tonight.

## 2015-03-26 NOTE — BHH Group Notes (Signed)
[facility] Group Notes:  (Clinical Social Work)  03/26/2015  10:00-11:00AM  Summary of Progress/Problems:   The main focus of today's process group was to   1)  discuss the importance of adding supports  2)  define health supports versus unhealthy supports  3)  identify the patient's current unhealthy supports and plan how to handle them  4)  Identify the patient's current healthy supports and plan what to add.  An emphasis was placed on using counselor, doctor, therapy groups, 12-step groups, and problem-specific support groups to expand supports.    The patient expressed full comprehension of the concepts presented, and agreed that there is a need to add more supports.  The patient stated that people in the hospital are her main supports right now, and her close friend from [place].  Her home group and church are significant supports.  She talked about not trusting her psychiatrist of over 20 years because he did not realize she was manipulating him to get more of the medication she was abusing.  We discussed calling him from the hospital to ensure that he is aware, and she stated staff is already doing that.  She expressed doubt that he would pay attention to that, so it was suggested that she take someone with her to see her doctor who could keep her on track with asking for certain medications/not asking for those that have harmed her.  Type of Therapy:  Process Group with Motivational Interviewing  Participation Level:  Active  Participation Quality:  Attentive, Sharing and Supportive  Affect:  Anxious, Flat and Tearful  Cognitive:  Appropriate  Insight:  Developing/Improving  Engagement in Therapy:  Engaged  Modes of Intervention:   Education, Support and Processing, Activity  Selmer Dominion, LCSW 03/26/2015

## 2015-03-26 NOTE — Progress Notes (Signed)
Writer spoke with patient 1:1 and she reports to feeling a little better now. She reports not feeling very good earlier today d/t experiencing some withdrawal symptoms along with nausea associated with her gastric bypass. She reports that she is glad that she is here and getting help. She reports that she is not going to beat herself up because of her relapse. She attended [place] meeting tonight and reports that she has been here before in the past leading [place] meetings. She denies si/hi/a/v hallucinations. Support given and safety maintained on unit with 15 min checks.

## 2015-03-27 NOTE — Progress Notes (Signed)
Writer has observed patient up in the dayroom watching tv and interacting appropriately with select peers. She reports to Probation officer that she has felt much better today and was able to eat a little bit of solid food and it stayed down. She reports enjoying the groups on today and attended [place] meeting tonight. She was compliant with her scheduled medications and informed of a repeat visteril available if needed. Support given and safety maintained on unit with 15 min checks.

## 2015-03-27 NOTE — Progress Notes (Signed)
DAR NOTE: Pt present with flat affect and depressed mood in the unit. Pt has been interacting with peers in the day room.. Pt denies physical pain, took all her meds as scheduled. As per self inventory, pt had a good night sleep, fair appetite, normal energy, and good concentration. Pt rate depression at 7, hopeless ness at 3, and anxiety at 5. Pt's safety ensured with 15 minute and environmental checks. Pt currently denies SI/HI and A/V hallucinations. Pt verbally agrees to seek staff if SI/HI or A/VH occurs and to consult with staff before acting on these thoughts. Will continue POC.

## 2015-03-27 NOTE — Tx Team (Addendum)
Interdisciplinary Treatment Plan Update (Adult) Date: 03/27/2015    Time Reviewed: 9:30 AM  Progress in Treatment: Attending groups: Yes Participating in groups: Yes Taking medication as prescribed: Yes Tolerating medication: Yes Family/Significant other contact made: No, CSW assessing for appropriate contacts Patient understands diagnosis: Yes Discussing patient identified problems/goals with staff: Yes Medical problems stabilized or resolved: Yes Denies suicidal/homicidal ideation: Yes Issues/concerns per patient self-inventory: Yes Other:  New problem(s) identified: N/A  Discharge Plan or Barriers: Patient plans to return home to follow up with Dr. Reece Levy for outpatient services.   Reason for Continuation of Hospitalization:  Depression Anxiety Medication Stabilization   Comments: N/A  Estimated length of stay: Discharge anticipated for 03/28/15   Patient is a 60 year old female with a diagnosis of Bipolar I Disorder. Patient presented to the hospital for ETOH and sedative detx. Patient reports primary triggers for admission was panic attack. Patient will benefit from crisis stabilization medication evaluation, group therapy and psychoeducation in addition to case management for discharge planning. At discharge, it is recommended that patient remain compliant with established discharge plan and continued treatment.  Review of initial/current patient goals per problem list:  1. Goal(s): Patient will participate in aftercare plan   Met: Yes   Target date: 3-5 days post admission date   As evidenced by: Patient will participate within aftercare plan AEB aftercare provider and housing plan at discharge being identified.  2/20: Patient plans to return home to follow up with Dr. Reece Levy for outpatient services.     2. Goal (s): Patient will exhibit decreased depressive symptoms and suicidal ideations.   Met: Adequate for discharge per MD   Target date: 3-5 days post  admission date   As evidenced by: Patient will utilize self rating of depression at 3 or below and demonstrate decreased signs of depression or be deemed stable for discharge by MD.  2/20: Patient rated depression at 5, denies SI.  2/21: Adequate for discharge. Patient reports an improvement in her symptoms and reports feeling safe to discharge.     3. Goal(s): Patient will demonstrate decreased signs and symptoms of anxiety.   Met: Adequate for discharge per MD   Target date: 3-5 days post admission date   As evidenced by: Patient will utilize self rating of anxiety at 3 or below and demonstrated decreased signs of anxiety, or be deemed stable for discharge by MD   2/20: Patient reports moderate anxiety.  2/21: Adequate for discharge. Patient reports an improvement in her symptoms and reports feeling safe to discharge.     4. Goal(s): Patient will demonstrate decreased signs of withdrawal due to substance abuse   Met: Yes   Target date: 3-5 days post admission date   As evidenced by: Patient will produce a CIWA/COWS score of 0, have stable vitals signs, and no symptoms of withdrawal   2/20: Goal not met: Pt continues to have withdrawal symptoms of anxiety and tremor and a CIWA score of a 2.  Pt to show decrease withdrawal symptoms prior to d/c.  2/21: Goal met. No withdrawal symptoms reported at this time per medical chart.     Attendees: Patient:    Family:    Physician: Dr. Sabra Heck 03/27/2015 9:30 AM  Nursing: Grayland Ormond, Eulogio Bear, [place], RN 03/27/2015 9:30 AM  Clinical Social Worker: Tilden Fossa, LCSW 03/27/2015 9:30 AM  Other: Peri Maris, LCSWA; Heather Smart, LCSW  03/27/2015 9:30 AM  Other: Norberto Sorenson, P4CC 03/27/2015 9:30 AM  Other: Lars Pinks,  Case Manager 03/27/2015 9:30 AM  Other: Ricky Ala, NP 03/27/2015 9:30 AM        Scribe for Treatment Team:  Tilden Fossa, [facility]

## 2015-03-27 NOTE — Plan of Care (Signed)
Problem: [facility] Concurrent Medical Problem Goal: STG-Compliance with medication and/or treatment as ordered (STG-Compliance with medication and/or treatment as ordered by MD)  Outcome: Progressing Patient was compliant with her scheduled medications.

## 2015-03-27 NOTE — Progress Notes (Signed)
   [facility] LCSW Aftercare Discharge Planning Group Note  03/27/2015  8:45 AM   Participation Quality: Alert, Appropriate and Oriented  Mood/Affect: anxious, tearful  Depression Rating: 5  Anxiety Rating: Moderate   Thoughts of Suicide: Pt denies SI/HI  Will you contract for safety? Yes  Current AVH: Pt denies  Plan for Discharge/Comments: Pt attended discharge planning group and actively participated in group. CSW provided pt with today's workbook. Patient plans to return home to follow up with Dr. Reece Levy for outpatient services.   Transportation Means: Pt reports access to transportation  Supports: No supports mentioned at this time  Tilden Fossa, MSW, [employer] Clinical Social Worker [employer] [phone]

## 2015-03-27 NOTE — BHH Suicide Risk Assessment (Signed)
Patient not admitted with SI and denied SI since admission. SPE not required but was reviewed with patient.  Tamara Fossa, LCSW Clinical Social Worker [facility] [phone]

## 2015-03-27 NOTE — Progress Notes (Signed)
Pt attended evening AA group. 

## 2015-03-27 NOTE — Progress Notes (Signed)
[facility] MD Progress Note  03/27/2015 8:02 PM Tamara Mccullough  MRN:  [id number] Subjective:  States she relapsed on alcohol and overtook her Ambien. States the Ambien caused some problems with memory and she did not realized she had taken it and took some more. Admits it did not help she combined it with alcohol. States she is ashamed as she was very strong in her Recovery doing a lot of service work and she still does not know how she allowed the alcohol back in her life. She states she usually does well on her medications does a lot of service work.  Principal Problem: Bipolar disorder with severe depression ([place]) Diagnosis:   Patient Active Problem List   Diagnosis Date Noted  . Alcohol dependence with withdrawal with complication ([place]) 123456   . Bipolar disorder with severe depression ([place]) [F31.4] 03/24/2015  . Left knee pain [M25.562] 10/31/2014  . Insomnia due to anxiety and fear [F51.05, F40.9] 12/20/2013  . Cervical stenosis of spinal canal [M48.02] 11/09/2013  . RLS (restless legs syndrome) [G25.81] 08/09/2013  . Multinodular goiter [E04.2] 06/09/2013  . Alcohol dependence ([place]) [F10.20] 03/15/2013  . Overdose of sleeping tabs [T42.71XA] 03/10/2013  . Essential hypertension, benign [I10] 05/20/2012  . GAD (generalized anxiety disorder) [F41.1] 05/20/2012  . Hyperlipidemia with target LDL less than 100 [E78.5] 05/20/2012  . Bipolar disorder ([place]) [F31.9] 05/20/2012  . Anemia, vitamin B12 deficiency [D51.9] 07/06/2009  . Obesity [E66.9] 06/29/2009  . PERSONAL HX COLONIC POLYPS [Z86.010] 06/29/2009   Total Time spent with patient: 20 minutes  Past Psychiatric History: see admission H and P  Past Medical History:  Past Medical History  Diagnosis Date  . Anemia   . Depression   . Depression   . Morbid obesity ([place])   . Edema   . Hypertension   . Chronic back pain   . Peripheral neuropathy ([place])   . Hyperlipidemia   . Panic attacks   . Insomnia   . Restless leg  syndrome   . PONV (postoperative nausea and vomiting)   . Sleep apnea     has not used CPAP in 3 years   . Asthma     slight  . Arthritis     back   . Seizures ([place])     hx of seizure 05/2013     Past Surgical History  Procedure Laterality Date  . 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 Tubal ligation    . Back surgery    . Thyroid lobectomy Right 08/12/2013    Procedure: RIGHT THYROID LOBECTOMY;  Surgeon: Odis Hollingshead, MD;  Location: [facility];  Service: General;  Laterality: Right;  . Minor hemorrhoidectomy    . Ctr    . Anterior cervical decomp/discectomy fusion N/A 11/09/2013    Procedure:  Anterior cervical decompression/diskectomy/fusion cervical four-five ,cervical five-six,cervical six-seven;  Surgeon: Floyce Stakes, MD;  Location: [facility];  Service: Neurosurgery;  Laterality: N/A;   Family History:  Family History  Problem Relation Age of Onset  . Congestive Heart Failure Mother   . Diabetes Mother   . Cancer Mother     cervical  . Congestive Heart Failure Father   . Emphysema Father   . Alcohol abuse Father   . Alcohol abuse Sister   . Alcohol abuse Brother   . Alcohol abuse Paternal Grandfather   . Alcohol abuse Brother   . Alcohol abuse Brother    Family Psychiatric  History: see admission H and P Social  History:  History  Alcohol Use  . Yes    Comment: 3/4 of 5th liquor daily     History  Drug Use  . Yes  . Special: Benzodiazepines    Social History   Social History  . Marital Status: Divorced    Spouse Name: N/A  . Number of Children: N/A  . Years of Education: N/A   Social History Main Topics  . Smoking status: Former Smoker    Quit date: 02/05/1996  . Smokeless tobacco: Never Used  . Alcohol Use: Yes     Comment: 3/4 of 5th liquor daily  . Drug Use: Yes    Special: Benzodiazepines  . Sexual Activity: No   Other Topics Concern  . None   Social History Narrative   Additional Social History:    Pain Medications: see mar Prescriptions:  see mar Over the Counter: see mar History of alcohol / drug use?: Yes Longest period of sobriety (when/how long): 20 yrs from 1996 to 2015 Negative Consequences of Use: Personal relationships Withdrawal Symptoms: Tremors, Diarrhea, Nausea / Vomiting, Anorexia Name of Substance 1: same as below Name of Substance 2: same as below                Sleep: Fair  Appetite:  Fair  Current Medications: Current Facility-Administered Medications  Medication Dose Route Frequency Provider Last Rate Last Dose  . acetaminophen (TYLENOL) tablet 650 mg  650 mg Oral Q6H PRN Harriet Butte, NP   650 mg at 03/27/15 1201  . alum & mag hydroxide-simeth (MAALOX/MYLANTA) 200-200-20 MG/5ML suspension 30 mL  30 mL Oral Q4H PRN Harriet Butte, NP      . busPIRone (BUSPAR) tablet 20 mg  20 mg Oral BID Harriet Butte, NP   20 mg at 03/27/15 1654  . chlordiazePOXIDE (LIBRIUM) capsule 25 mg  25 mg Oral Q6H PRN Merian Capron, MD   25 mg at 03/25/15 1114  . chlordiazePOXIDE (LIBRIUM) capsule 25 mg  25 mg Oral BH-qamhs Merian Capron, MD   25 mg at 03/27/15 [id number]   Followed by  . [START ON 03/28/2015] chlordiazePOXIDE (LIBRIUM) capsule 25 mg  25 mg Oral Daily Merian Capron, MD      . FLUoxetine (PROZAC) capsule 60 mg  60 mg Oral q morning - 10a Harriet Butte, NP   60 mg at 03/27/15 1159  . hydrochlorothiazide (MICROZIDE) capsule 12.5 mg  12.5 mg Oral Daily Nicholaus Bloom, MD   12.5 mg at 03/27/15 [id number]  . hydrOXYzine (ATARAX/VISTARIL) tablet 25 mg  25 mg Oral Q6H PRN Merian Capron, MD      . hydrOXYzine (ATARAX/VISTARIL) tablet 50 mg  50 mg Oral QHS,MR X 1 Derrill Center, NP   50 mg at 03/26/15 2135  . lamoTRIgine (LAMICTAL) tablet 150 mg  150 mg Oral q morning - 10a Harriet Butte, NP   150 mg at 03/27/15 1158  . lisinopril (PRINIVIL,ZESTRIL) tablet 10 mg  10 mg Oral Daily Nicholaus Bloom, MD   10 mg at 03/27/15 0820  . loperamide (IMODIUM) capsule 2-4 mg  2-4 mg Oral PRN Merian Capron, MD      . magnesium  hydroxide (MILK OF MAGNESIA) suspension 30 mL  30 mL Oral Daily PRN Harriet Butte, NP      . multivitamin with minerals tablet 1 tablet  1 tablet Oral Daily Merian Capron, MD   1 tablet at 03/27/15 0819  . ondansetron (ZOFRAN-ODT) disintegrating tablet 4 mg  4 mg Oral Q6H PRN Merian Capron, MD   4 mg at 03/26/15 0616  . thiamine (VITAMIN B-1) tablet 100 mg  100 mg Oral Daily Merian Capron, MD   100 mg at 03/27/15 [id number]    Lab Results: No results found for this or any previous visit (from the past 48 hour(s)).  Blood Alcohol level:  Lab Results  Component Value Date   [facility] <5 03/24/2015   ETH <11 03/04/2013    Physical Findings: AIMS: Facial and Oral Movements Muscles of Facial Expression: None, normal Lips and Perioral Area: None, normal Jaw: None, normal Tongue: None, normal,Extremity Movements Upper (arms, wrists, hands, fingers): None, normal Lower (legs, knees, ankles, toes): None, normal, Trunk Movements Neck, shoulders, hips: None, normal, Overall Severity Severity of abnormal movements (highest score from questions above): None, normal Incapacitation due to abnormal movements: None, normal Patient's awareness of abnormal movements (rate only patient's report): No Awareness, Dental Status Current problems with teeth and/or dentures?: No Does patient usually wear dentures?: No  CIWA:  CIWA-Ar Total: 0 COWS:     Musculoskeletal: Strength & Muscle Tone: within normal limits Gait & Station: normal Patient leans: normal  Psychiatric Specialty Exam: Review of Systems  Constitutional: Negative.   HENT: Negative.   Eyes: Negative.   Respiratory: Negative.   Cardiovascular: Negative.   Gastrointestinal: Negative.   Genitourinary: Negative.   Musculoskeletal: Negative.   Skin: Negative.   Neurological: Negative.   Endo/Heme/Allergies: Negative.   Psychiatric/Behavioral: Positive for depression and substance abuse. The patient is nervous/anxious.     Blood pressure  146/79, pulse 60, temperature 97.5 F (36.4 C), temperature source Oral, resp. rate 20, height 5' 2.5" (1.588 m), weight 104.327 kg (230 lb).Body mass index is 41.37 kg/(m^2).  General Appearance: Fairly Groomed  Engineer, water::  Fair  Speech:  Clear and Coherent  Volume:  Normal  Mood:  Anxious  Affect:  Appropriate  Thought Process:  Coherent and Goal Directed  Orientation:  Full (Time, Place, and Person)  Thought Content:  symptoms events worries concerns  Suicidal Thoughts:  No  Homicidal Thoughts:  No  Memory:  Immediate;   Fair Recent;   Fair Remote;   Fair  Judgement:  Fair  Insight:  Present  Psychomotor Activity:  Normal  Concentration:  Fair  Recall:  [employer] [place]  Language: Fair  Akathisia:  No  Handed:  Right  AIMS (if indicated):     Assets:  Desire for Improvement  ADL's:  Intact  Cognition: WNL  Sleep:  Number of Hours: 5.5   Treatment Plan Summary: Daily contact with patient to assess and evaluate symptoms and progress in treatment and Medication management Supportive approach/coping skills Alcohol dependence/Ambien abuse continue the Librium detox protocol/work a relapse prevention plan Depression; continue the Prozac 60 mg daily Mood instability; continue the Lamictal  Anxiety; continue the Buspar Insomnia; continue the trial with Vistaril 50 mg HS ( did OK last night) Work with CBT/mindfulness  Andrei Mccook A, MD 03/27/2015, 8:02 PM

## 2015-03-28 MED ORDER — BUSPIRONE HCL 10 MG PO TABS: 20.0000 mg | ORAL_TABLET | Freq: Two times a day (BID) | ORAL | Status: DC

## 2015-03-28 MED ORDER — HYDROXYZINE HCL 25 MG PO TABS: 25.0000 mg | ORAL_TABLET | Freq: Four times a day (QID) | ORAL | Status: DC | PRN

## 2015-03-28 MED ORDER — FLUOXETINE HCL 20 MG PO CAPS: 60.0000 mg | ORAL_CAPSULE | Freq: Every morning | ORAL | Status: DC

## 2015-03-28 MED ORDER — LAMOTRIGINE 150 MG PO TABS: 150.0000 mg | ORAL_TABLET | Freq: Every morning | ORAL | Status: DC

## 2015-03-28 MED ORDER — LISINOPRIL-HYDROCHLOROTHIAZIDE 10-12.5 MG PO TABS: 1.0000 | ORAL_TABLET | Freq: Every morning | ORAL | Status: DC

## 2015-03-28 NOTE — BHH Group Notes (Signed)
   [facility] LCSW Aftercare Discharge Planning Group Note  03/27/15  8:45 AM   Participation Quality: Alert, Appropriate and Oriented  Mood/Affect: anxious, tearful  Depression Rating: 5  Anxiety Rating: Moderate   Thoughts of Suicide: Pt denies SI/HI  Will you contract for safety? Yes  Current AVH: Pt denies  Plan for Discharge/Comments: Pt attended discharge planning group and actively participated in group. CSW provided pt with today's workbook. Patient plans to return home to follow up with Dr. Reece Levy for outpatient services.   Transportation Means: Pt reports access to transportation  Supports: No supports mentioned at this time  Tilden Fossa, MSW, [employer] Clinical Social Worker [employer] [phone]

## 2015-03-28 NOTE — Discharge Summary (Signed)
Physician Discharge Summary Note  Patient:  Tamara Mccullough is an 60 y.o., female MRN:  [id number] DOB:  Jun 09, 1955 Patient phone:  [phone] (home)  Patient address:   [place] [address],  Total Time spent with patient: 30 minutes  Date of Admission:  03/24/2015 Date of Discharge: 03/28/2015  Reason for Admission:  Overdose Ambien  Principal Problem: Bipolar disorder with severe depression [facility]) Discharge Diagnoses: Patient Active Problem List   Diagnosis Date Noted  . Alcohol dependence with withdrawal with complication ([place]) 123456   . Bipolar disorder with severe depression ([place]) [F31.4] 03/24/2015  . Left knee pain [M25.562] 10/31/2014  . Insomnia due to anxiety and fear [F51.05, F40.9] 12/20/2013  . Cervical stenosis of spinal canal [M48.02] 11/09/2013  . RLS (restless legs syndrome) [G25.81] 08/09/2013  . Multinodular goiter [E04.2] 06/09/2013  . Alcohol dependence ([place]) [F10.20] 03/15/2013  . Overdose of sleeping tabs [T42.71XA] 03/10/2013  . Essential hypertension, benign [I10] 05/20/2012  . GAD (generalized anxiety disorder) [F41.1] 05/20/2012  . Hyperlipidemia with target LDL less than 100 [E78.5] 05/20/2012  . Bipolar disorder ([place]) [F31.9] 05/20/2012  . Anemia, vitamin B12 deficiency [D51.9] 07/06/2009  . Obesity [E66.9] 06/29/2009  . PERSONAL HX COLONIC POLYPS [Z86.010] 06/29/2009    Past Psychiatric History:  See above noted  Past Medical History:  Past Medical History  Diagnosis Date  . Anemia   . Depression   . Depression   . Morbid obesity ([place])   . Edema   . Hypertension   . Chronic back pain   . Peripheral neuropathy ([place])   . Hyperlipidemia   . Panic attacks   . Insomnia   . Restless leg syndrome   . PONV (postoperative nausea and vomiting)   . Sleep apnea     has not used CPAP in 3 years   . Asthma     slight  . Arthritis     back   . Seizures ([place])     hx of seizure 05/2013     Past Surgical History   Procedure Laterality Date  . 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 Tubal ligation    . Back surgery    . Thyroid lobectomy Right 08/12/2013    Procedure: RIGHT THYROID LOBECTOMY;  Surgeon: Odis Hollingshead, MD;  Location: [facility];  Service: General;  Laterality: Right;  . Minor hemorrhoidectomy    . Ctr    . Anterior cervical decomp/discectomy fusion N/A 11/09/2013    Procedure:  Anterior cervical decompression/diskectomy/fusion cervical four-five ,cervical five-six,cervical six-seven;  Surgeon: Floyce Stakes, MD;  Location: [facility];  Service: Neurosurgery;  Laterality: N/A;   Family History:  Family History  Problem Relation Age of Onset  . Congestive Heart Failure Mother   . Diabetes Mother   . Cancer Mother     cervical  . Congestive Heart Failure Father   . Emphysema Father   . Alcohol abuse Father   . Alcohol abuse Sister   . Alcohol abuse Brother   . Alcohol abuse Paternal Grandfather   . Alcohol abuse Brother   . Alcohol abuse Brother    Family Psychiatric  History:   Social History:  History  Alcohol Use  . Yes    Comment: 3/4 of 5th liquor daily     History  Drug Use  . Yes  . Special: Benzodiazepines    Social History   Social History  . Marital Status: Divorced    Spouse Name: N/A  .  Number of Children: N/A  . Years of Education: N/A   Social History Main Topics  . Smoking status: Former Smoker    Quit date: 02/05/1996  . Smokeless tobacco: Never Used  . Alcohol Use: Yes     Comment: 3/4 of 5th liquor daily  . Drug Use: Yes    Special: Benzodiazepines  . Sexual Activity: No   Other Topics Concern  . None   Social History Narrative    Hospital Course:  Tamara Mccullough is a divorced 60 y.o. female who presented unaccompanied to [facility] after being referred for assessment by her psychiatrist, Dr. Launa Grill. Patient stated that she had hx of bipolar disorder and anxiety, had panic attack at MD's office and overdosed on Ambien.  Tamara Mccullough  was admitted for Bipolar disorder with severe depression ([place]) and crisis management.  She was treated with the following medications listed below.  Tamara Mccullough was discharged with current medication and was instructed on how to take medications as prescribed; (details listed below under Medication List).  Medical problems were identified and treated as needed.  Home medications were restarted as appropriate.  Improvement was monitored by observation and Tamara Mccullough daily report of symptom reduction.  Emotional and mental status was monitored by daily self-inventory reports completed by Tamara Mccullough and clinical staff.         Tamara Mccullough was evaluated by the treatment team for stability and plans for continued recovery upon discharge.  Tamara Mccullough motivation was an integral factor for scheduling further treatment.  Employment, transportation, bed availability, health status, family support, and any pending legal issues were also considered during her hospital stay.  She was offered further treatment options upon discharge including but not limited to Residential, Intensive Outpatient, and Outpatient treatment.  Tamara Mccullough will follow up with the services as listed below under Follow Up Information.     Upon completion of this admission the Tamara Mccullough was both mentally and medically stable for discharge denying suicidal/homicidal ideation, auditory/visual/tactile hallucinations, delusional thoughts and paranoia.     Physical Findings: AIMS: Facial and Oral Movements Muscles of Facial Expression: None, normal Lips and Perioral Area: None, normal Jaw: None, normal Tongue: None, normal,Extremity Movements Upper (arms, wrists, hands, fingers): None, normal Lower (legs, knees, ankles, toes): None, normal, Trunk Movements Neck, shoulders, hips: None, normal, Overall Severity Severity of abnormal movements (highest score from questions above): None,  normal Incapacitation due to abnormal movements: None, normal Patient's awareness of abnormal movements (rate only patient's report): No Awareness, Dental Status Current problems with teeth and/or dentures?: No Does patient usually wear dentures?: No  CIWA:  CIWA-Ar Total: 0 COWS:     Musculoskeletal: Strength & Muscle Tone: within normal limits Gait & Station: normal Patient leans: N/A  Psychiatric Specialty Exam:  SEE MD SRA Review of Systems  All other systems reviewed and are negative.   Blood pressure 130/73, pulse 69, temperature 98.4 F (36.9 C), temperature source Oral, resp. rate 16, height 5' 2.5" (1.588 m), weight 104.327 kg (230 lb).Body mass index is 41.37 kg/(m^2).  Have you used any form of tobacco in the last 30 days? (Cigarettes, Smokeless Tobacco, Cigars, and/or Pipes): No  Has this patient used any form of tobacco in the last 30 days? (Cigarettes, Smokeless Tobacco, Cigars, and/or Pipes) Yes, N/A  Blood Alcohol level:  Lab Results  Component Value Date   ETH <5 03/24/2015   ETH <11 03/04/2013  Metabolic Disorder Labs:  Lab Results  Component Value Date   HGBA1C 5.0 01/03/2015   No results found for: PROLACTIN Lab Results  Component Value Date   CHOL 155 09/30/2014   TRIG 93 09/30/2014   HDL 74 09/30/2014   CHOLHDL 2.1 09/30/2014   LDLCALC 62 09/30/2014   LDLCALC 61 08/09/2013    See Psychiatric Specialty Exam and Suicide Risk Assessment completed by Attending Physician prior to discharge.  Discharge destination:  Home  Is patient on multiple antipsychotic therapies at discharge:  No   Has Patient had three or more failed trials of antipsychotic monotherapy by history:  No  Recommended Plan for Multiple Antipsychotic Therapies: NA     Medication List    STOP taking these medications        diclofenac sodium 1 % Gel  Commonly known as:  VOLTAREN     FLUoxetine 20 MG tablet  Commonly known as:  PROZAC  Replaced by:  FLUoxetine 20 MG  capsule     nystatin cream  Commonly known as:  MYCOSTATIN     rosuvastatin 5 MG tablet  Commonly known as:  CRESTOR     traMADol 50 MG tablet  Commonly known as:  ULTRAM     zolpidem 10 MG tablet  Commonly known as:  AMBIEN      TAKE these medications      Indication   busPIRone 10 MG tablet  Commonly known as:  BUSPAR  Take 2 tablets (20 mg total) by mouth 2 (two) times daily.   Indication:  Aggressive Behavior, Anxiety Disorder     FLUoxetine 20 MG capsule  Commonly known as:  PROZAC  Take 3 capsules (60 mg total) by mouth every morning.   Indication:  Major Depressive Disorder     hydrOXYzine 25 MG tablet  Commonly known as:  ATARAX/VISTARIL  Take 1 tablet (25 mg total) by mouth every 6 (six) hours as needed for anxiety.   Indication:  Anxiety Neurosis     lamoTRIgine 150 MG tablet  Commonly known as:  LAMICTAL  Take 1 tablet (150 mg total) by mouth every morning.   Indication:  Manic-Depression     lisinopril-hydrochlorothiazide 10-12.5 MG tablet  Commonly known as:  PRINZIDE,ZESTORETIC  Take 1 tablet by mouth every morning.   Indication:  High Blood Pressure           Follow-up Information    Follow up with [facility] On 04/06/2015.   Specialty:  Behavioral Health   Why:  Medication management appt on Thursday March 2nd at 1:50pm with Dr. Reece Levy. Ask about referral for therapy. Call office if you need to reschedule.    Contact information:   [address] [place] [address] [phone]       Follow-up recommendations:  Activity:  as tol Diet:  as tol  Comments:  1.  Take all your medications as prescribed.              2.  Report any adverse side effects to outpatient provider.                       3.  Patient instructed to not use alcohol or illegal drugs while on prescription medicines.            4.  In the event of worsening symptoms, instructed patient to call 911, the crisis hotline or go to nearest  emergency room for evaluation  of symptoms.  Signed: Janett Labella, NP [facility] 03/28/2015, 2:51 PM  I personally assessed the patient and formulated the plan Geralyn Flash A. Sabra Heck, M.D.

## 2015-03-28 NOTE — Progress Notes (Signed)
  [facility] Adult Case Management Discharge Plan :  Will you be returning to the same living situation after discharge:  Yes,  patient plans to return home At discharge, do you have transportation home?: Yes,  patient reports access to transportation Do you have the ability to pay for your medications: Yes,  patient will be provided with prescriptions at discharge  Release of information consent forms completed and in the chart;  Patient's signature needed at discharge.  Patient to Follow up at: Follow-up Information    Follow up with [facility] On 04/06/2015.   Specialty:  Behavioral Health   Why:  Medication management appt on Thursday March 2nd at 1:50pm with Dr. Reece Levy. Ask about referral for therapy. Call office if you need to reschedule.    Contact information:   [address] [place] [address] [phone]       Next level of care provider has access to [place] and Suicide Prevention discussed: Yes,  with patient  Have you used any form of tobacco in the last 30 days? (Cigarettes, Smokeless Tobacco, Cigars, and/or Pipes): No  Has patient been referred to the Quitline?: N/A patient is not a smoker  Patient has been referred for addiction treatment: Pt. refused referral  Alisea Matte, Casimiro Needle 03/28/2015, 11:03 AM

## 2015-03-28 NOTE — Progress Notes (Signed)
Patient verbalizes readiness for discharge. Follow up plan explained, Rx's given along with sample meds. All belongings returned. Patient verbalizes understanding. Denies SI/HI and assures this Probation officer she will seek assistance should that status change. Patient discharged ambulatory and in stable condition to friend to transport. Jamie Kato

## 2015-03-28 NOTE — BHH Group Notes (Signed)
Late entry   [facility] LCSW Group Therapy 03/27/15  1:15 pm  Type of Therapy: Group Therapy Participation Level: Active  Participation Quality: Attentive, Sharing and Supportive  Affect: Appropriate  Cognitive: Alert and Oriented  Insight: Developing/Improving and Engaged  Engagement in Therapy: Developing/Improving and Engaged  Modes of Intervention: Clarification, Confrontation, Discussion, Education, Exploration,  Limit-setting, Orientation, Problem-solving, Rapport Building, Art therapist, Socialization and Support  Summary of Progress/Problems: Pt identified obstacles faced currently and processed barriers involved in overcoming these obstacles. Pt identified steps necessary for overcoming these obstacles and explored motivation (internal and external) for facing these difficulties head on. Pt further identified one area of concern in their lives and chose a goal to focus on for today. Patient identified loneliness and addiction as her obstacles. Patient discussed her plan to get involved in AA/NA meetings again at discharge, as that has helped her in the past.   Tilden Fossa, [place] Worker [facility] [phone]

## 2015-03-28 NOTE — Progress Notes (Signed)
Pt reports she is feeling great today and that her day went well.  She reports that she is supposed to discharge on Tuesday.  She denies having any withdrawal symptoms.  She denies SI/HI/AVH.  Pt reports that the groups have been good for her, and she has learned a lot about herself.  Support and encouragement offered.  Pt makes her needs known to staff.  Pt voiced no needs or concerns at the time of assessment.  Later in the evening, pt came to the nurse's station requesting that she be able to get the Ambien out of her locker and dispose of it, so that it would not be in her belongings when she went home.  She felt she could not rest until this was done.  The CN went with the pt and the medication was put in the Pharmacy return box to be disposed of per request of the patient.  Pt took her second dose of Vistaril 50mg  and returned to her room to go to bed.  Safety maintained with q15 minute checks.

## 2015-03-28 NOTE — BHH Suicide Risk Assessment (Signed)
[facility] Discharge Suicide Risk Assessment   Principal Problem: Bipolar disorder with severe depression [facility]) Discharge Diagnoses:  Patient Active Problem List   Diagnosis Date Noted  . Alcohol dependence with withdrawal with complication ([place]) 123456   . Bipolar disorder with severe depression ([place]) [F31.4] 03/24/2015  . Left knee pain [M25.562] 10/31/2014  . Insomnia due to anxiety and fear [F51.05, F40.9] 12/20/2013  . Cervical stenosis of spinal canal [M48.02] 11/09/2013  . RLS (restless legs syndrome) [G25.81] 08/09/2013  . Multinodular goiter [E04.2] 06/09/2013  . Alcohol dependence ([place]) [F10.20] 03/15/2013  . Overdose of sleeping tabs [T42.71XA] 03/10/2013  . Essential hypertension, benign [I10] 05/20/2012  . GAD (generalized anxiety disorder) [F41.1] 05/20/2012  . Hyperlipidemia with target LDL less than 100 [E78.5] 05/20/2012  . Bipolar disorder ([place]) [F31.9] 05/20/2012  . Anemia, vitamin B12 deficiency [D51.9] 07/06/2009  . Obesity [E66.9] 06/29/2009  . PERSONAL HX COLONIC POLYPS [Z86.010] 06/29/2009    Total Time spent with patient: 20 minutes  Musculoskeletal: Strength & Muscle Tone: within normal limits Gait & Station: normal Patient leans: normal  Psychiatric Specialty Exam: Review of Systems  Constitutional: Negative.   HENT: Negative.   Eyes: Negative.   Respiratory: Negative.   Cardiovascular: Negative.   Gastrointestinal: Negative.   Genitourinary: Negative.   Musculoskeletal: Negative.   Skin: Negative.   Neurological: Negative.   Endo/Heme/Allergies: Negative.   Psychiatric/Behavioral: Positive for substance abuse. The patient is nervous/anxious.     Blood pressure 130/73, pulse 69, temperature 98.4 F (36.9 C), temperature source Oral, resp. rate 16, height 5' 2.5" (1.588 m), weight 104.327 kg (230 lb).Body mass index is 41.37 kg/(m^2).  General Appearance: Fairly Groomed  Engineer, water::  Fair  Speech:  Clear and [id number]  Volume:  Normal   Mood:  Euthymic  Affect:  Appropriate  Thought Process:  Coherent and Goal Directed  Orientation:  Full (Time, Place, and Person)  Thought Content:  plans as she moves on, relapse prevention plan  Suicidal Thoughts:  No  Homicidal Thoughts:  No  Memory:  Immediate;   Fair Recent;   Fair Remote;   Fair  Judgement:  Fair  Insight:  Present  Psychomotor Activity:  Normal  Concentration:  Fair  Recall:  [employer] of Knowledge:Fair  Language: Fair  Akathisia:  No  Handed:  Right  AIMS (if indicated):     Assets:  Desire for Improvement Housing Social Support  Sleep:  Number of Hours: 4.75  Cognition: WNL  ADL's:  Intact  In full contact with reality. There are no active S/S of withdrawal. There are no active SI plans or intent. She is willing and motivated to pursue further work and regain her long term sobriety.  Mental Status Per Nursing Assessment::   On Admission:  NA  Demographic Factors:  Caucasian  Loss Factors: none identified  Historical Factors: none identified  Risk Reduction Factors:   Sense of responsibility to family, Positive social support and Positive therapeutic relationship  Continued Clinical Symptoms:  Bipolar Disorder:   Depressive phase Alcohol/Substance Abuse/Dependencies  Cognitive Features That Contribute To Risk:  None    Suicide Risk:  Minimal: No identifiable suicidal ideation.  Patients presenting with no risk factors but with morbid ruminations; may be classified as minimal risk based on the severity of the depressive symptoms  Follow-up Information    Follow up with [facility] On 04/06/2015.   Specialty:  Behavioral Health   Why:  Medication management appt on Thursday March 2nd  at 1:50pm with Dr. Reece Levy. Ask about referral for therapy. Call office if you need to reschedule.    Contact information:   [address] [place] [address] [phone]       Plan Of Care/Follow-up  recommendations:  Activity:  as tolerated Diet:  regular Follow up as above as well as AA Britton Bera A, MD 03/28/2015, 12:44 PM

## 2015-03-28 NOTE — Progress Notes (Signed)
Recreation Therapy Notes  Animal-Assisted Activity (AAA) Program Checklist/Progress Notes Patient Eligibility Criteria Checklist & Daily Group note for Rec Tx Intervention  Date: 02.21.2017 Time: 2:45pm Location: [address]   AAA/T Program Assumption of Risk Form signed by Patient/ or Parent Legal Guardian yes  Patient is free of allergies or sever asthma yes  Patient reports no fear of animals yes  Patient reports no history of cruelty to animals yes  Patient understands his/her participation is voluntary yes  Behavioral Response: Did not attend.   Laureen Ochs Nahomi Hegner, LRT/CTRS  Osby Sweetin L 03/28/2015 3:09 PM

## 2015-03-28 NOTE — Progress Notes (Signed)
Patient up and visible in the dayroom. Interacting with peers and staff. Rates her depression and anxiety both at a 5/10, hopelessness at a 10/10. Reports she is hoping for discharge today and states she feels ready though is somewhat nervous. Goal is to "absorb everything here before I go home later today." Affect appropriate, mood pleasant. Medicated per orders, emotional support offered. Self inventory reviewed. Fall precautions in place. Patient reports back discomfort of a 5/10 but does not desire anything for pain. Denies SI/HI and remains safe on level III obs. Jamie Kato

## 2015-04-05 ENCOUNTER — Ambulatory Visit (INDEPENDENT_AMBULATORY_CARE_PROVIDER_SITE_OTHER): Payer: Medicare Other | Admitting: Family Medicine

## 2015-04-05 ENCOUNTER — Encounter: Payer: Self-pay | Admitting: Family Medicine

## 2015-04-05 VITALS — BP 145/74 | HR 53 | Temp 97.5°F | Ht 62.5 in | Wt 229.8 lb

## 2015-04-05 DIAGNOSIS — Z1239 Encounter for other screening for malignant neoplasm of breast: Secondary | ICD-10-CM

## 2015-04-05 DIAGNOSIS — G2581 Restless legs syndrome: Secondary | ICD-10-CM

## 2015-04-05 DIAGNOSIS — Z0000 Encounter for general adult medical examination without abnormal findings: Secondary | ICD-10-CM

## 2015-04-05 DIAGNOSIS — I10 Essential (primary) hypertension: Secondary | ICD-10-CM

## 2015-04-05 NOTE — Patient Instructions (Signed)
Great to see you!  I think you are making very positive changes, try to get Dr. Reece Levy to send [place] notes.   Lets follow up in 3 months

## 2015-04-05 NOTE — Progress Notes (Signed)
   HPI  Patient presents today here for routine follow-up and to discuss recent hospitalization.  Hypertension Doing well, feels her numbers are elevated today due to anxiety. Good medication compliance. No chest pain, no dyspnea, no palpitations, no leg edema.  Mammogram - patient is willing but would like to wait 2-3 months.  We discussed her recent hospitalization. She was admitted to behavioral health for 5 days after Ambien overdose as well as alcohol detox, per patient She states that until she was represcribed Ambien she had been off of alcohol for 10+ years. She's happy with her medication changes. She is doing well with pain with Tylenol. She explains that Dr. Reece Levy takes care of her psychiatric medications, Dr. Joya Salm takes care of her pain medications, she would like [place] to take care of her lisinopril and Mirapex.  She has stopped tramadol   PMH: Smoking status noted ROS: Per HPI  Objective: BP 145/74 mmHg  Pulse 53  Temp(Src) 97.5 F (36.4 C) (Oral)  Ht 5' 2.5" (1.588 m)  Wt 229 lb 12.8 oz (104.237 kg)  BMI 41.34 kg/m2 Gen: NAD, alert, cooperative with exam HEENT: NCAT CV: RRR, good S1/S2, no murmur Resp: CTABL, no wheezes, non-labored Ext: No edema, warm Neuro: Alert and oriented, No gross deficits  Assessment and plan:  # HTN Doing well, no changes Recent labs reviewed- repeat in 3 months  # RLS Doing well on mirapex- no changes  # Bipolar d/o, anxiety Discussed recent events No ambien or similar medication in the future Defer psychiatric treatemnt to Dr. Reece Levy, I realize mirapex ma have some effect on BPD but since it is stable and continued after behavioral health I will continue Given card and request the Dr. reddy send notes so we are all on the same page  HCM mammo ordered and scheduled today Hep C with next draw    Laroy Apple, MD [facility] 04/05/2015, 12:57 PM

## 2015-04-06 DIAGNOSIS — F3181 Bipolar II disorder: Secondary | ICD-10-CM | POA: Diagnosis not present

## 2015-04-10 ENCOUNTER — Telehealth: Payer: Self-pay | Admitting: Family Medicine

## 2015-04-10 NOTE — Telephone Encounter (Signed)
Question answered. 

## 2015-04-13 DIAGNOSIS — Z0289 Encounter for other administrative examinations: Secondary | ICD-10-CM

## 2015-04-24 ENCOUNTER — Telehealth: Payer: Self-pay | Admitting: Family Medicine

## 2015-04-24 DIAGNOSIS — I10 Essential (primary) hypertension: Secondary | ICD-10-CM

## 2015-04-24 MED ORDER — LISINOPRIL-HYDROCHLOROTHIAZIDE 10-12.5 MG PO TABS: 1.0000 | ORAL_TABLET | Freq: Every morning | ORAL | Status: DC

## 2015-04-24 MED ORDER — PRAMIPEXOLE DIHYDROCHLORIDE 0.5 MG PO TABS: 0.5000 mg | ORAL_TABLET | Freq: Every day | ORAL | Status: DC

## 2015-04-24 NOTE — Telephone Encounter (Signed)
Both rx's were sent to pharmacy and pt is aware.

## 2015-04-28 NOTE — Telephone Encounter (Signed)
Forms upfront, pt aware

## 2015-06-08 ENCOUNTER — Encounter: Payer: Self-pay | Admitting: Family Medicine

## 2015-06-08 ENCOUNTER — Ambulatory Visit (INDEPENDENT_AMBULATORY_CARE_PROVIDER_SITE_OTHER): Payer: Medicare Other | Admitting: Family Medicine

## 2015-06-08 VITALS — BP 122/75 | HR 57 | Temp 96.9°F | Ht 62.5 in | Wt 210.8 lb

## 2015-06-08 DIAGNOSIS — I10 Essential (primary) hypertension: Secondary | ICD-10-CM | POA: Diagnosis not present

## 2015-06-08 DIAGNOSIS — Z0000 Encounter for general adult medical examination without abnormal findings: Secondary | ICD-10-CM

## 2015-06-08 DIAGNOSIS — L987 Excessive and redundant skin and subcutaneous tissue: Secondary | ICD-10-CM

## 2015-06-08 NOTE — Patient Instructions (Signed)
Great to see you!  We will work on a referral, no need to come back until August,

## 2015-06-08 NOTE — Progress Notes (Signed)
   HPI  Patient presents today here to request surgery referral.  Patient explains that 4-5 years ago she had a gastric bypass at [facility]. She weighed 378 at that time and has lost over 160 pounds. She has excess skin of the abdominal wall would like to consider getting it removed  She requests local surgeon rather than driving back interim.  Hypertension Good medication compliance No chest pain, dyspnea, palpitations.  PMH: Smoking status noted ROS: Per HPI  Objective: BP 122/75 mmHg  Pulse 57  Temp(Src) 96.9 F (36.1 C) (Oral)  Ht 5' 2.5" (1.588 m)  Wt 210 lb 12.8 oz (95.618 kg)  BMI 37.92 kg/m2 Gen: NAD, alert, cooperative with exam HEENT: NCAT CV: RRR, good S1/S2, no murmur Resp: CTABL, no wheezes, non-labored Ext: No edema, warm Neuro: Alert and oriented, No gross deficits  Assessment and plan:  # Excess skin of the abdominal wall Referral to general surgery placed Congratulated her weight loss, she still continues to lose weight  # Hypertension Doing well, no medication changes  # Healthcare maintenance Has mammogram scheduled for this month, encouraged her to get that completed      Orders Placed This Encounter  Procedures  . Ambulatory referral to General Surgery    Referral Priority:  Routine    Referral Type:  Surgical    Referral Reason:  Specialty Services Required    Requested Specialty:  General Surgery    Number of Visits Requested:  [place], MD [facility] 06/08/2015, 8:15 AM

## 2015-06-23 DIAGNOSIS — Z9884 Bariatric surgery status: Secondary | ICD-10-CM | POA: Diagnosis not present

## 2015-06-23 DIAGNOSIS — M793 Panniculitis, unspecified: Secondary | ICD-10-CM | POA: Diagnosis not present

## 2015-07-04 ENCOUNTER — Encounter: Payer: Medicare Other | Admitting: *Deleted

## 2015-07-07 ENCOUNTER — Ambulatory Visit: Payer: Medicare Other | Admitting: Family Medicine

## 2015-07-17 ENCOUNTER — Encounter: Payer: Self-pay | Admitting: Family Medicine

## 2015-07-17 ENCOUNTER — Ambulatory Visit (INDEPENDENT_AMBULATORY_CARE_PROVIDER_SITE_OTHER): Payer: Medicare Other

## 2015-07-17 ENCOUNTER — Ambulatory Visit (INDEPENDENT_AMBULATORY_CARE_PROVIDER_SITE_OTHER): Payer: Medicare Other | Admitting: Family Medicine

## 2015-07-17 VITALS — BP 116/70 | HR 76 | Temp 98.7°F | Ht 62.5 in | Wt 221.0 lb

## 2015-07-17 DIAGNOSIS — M25562 Pain in left knee: Secondary | ICD-10-CM

## 2015-07-17 DIAGNOSIS — M25561 Pain in right knee: Secondary | ICD-10-CM

## 2015-07-17 DIAGNOSIS — M1712 Unilateral primary osteoarthritis, left knee: Secondary | ICD-10-CM | POA: Diagnosis not present

## 2015-07-17 DIAGNOSIS — M4806 Spinal stenosis, lumbar region: Secondary | ICD-10-CM | POA: Diagnosis not present

## 2015-07-17 DIAGNOSIS — M1710 Unilateral primary osteoarthritis, unspecified knee: Secondary | ICD-10-CM | POA: Insufficient documentation

## 2015-07-17 DIAGNOSIS — M179 Osteoarthritis of knee, unspecified: Secondary | ICD-10-CM | POA: Insufficient documentation

## 2015-07-17 DIAGNOSIS — M48061 Spinal stenosis, lumbar region without neurogenic claudication: Secondary | ICD-10-CM | POA: Insufficient documentation

## 2015-07-17 DIAGNOSIS — M4802 Spinal stenosis, cervical region: Secondary | ICD-10-CM | POA: Diagnosis not present

## 2015-07-17 DIAGNOSIS — M1711 Unilateral primary osteoarthritis, right knee: Secondary | ICD-10-CM

## 2015-07-17 NOTE — Patient Instructions (Signed)
Great to see you!  I will wait for the x ray results prior to writing the orders.   Please let us know when we tell you the results that you needed the prescription.

## 2015-07-17 NOTE — Progress Notes (Addendum)
   HPI  Patient presents today here at the request of getting an up-and-down recliner.  Patient explains that she has a skin removal surgery coming up. She's very concerned that with her difficulty already getting up and down and pains that she deals with that she'll have a very difficult time getting up and down.  Her surgery approval is pending.  She has a history of lumbar back surgery and pain, bilateral knee pain, neck pain. She states it's difficult and painful to get up and down. Currently she is doing okay  PMH: Smoking status noted ROS: Per HPI  Objective: BP 116/70 mmHg  Pulse 76  Temp(Src) 98.7 F (37.1 C) (Oral)  Ht 5' 2.5" (1.588 m)  Wt 221 lb (100.245 kg)  BMI 39.75 kg/m2 Gen: NAD, alert, cooperative with exam HEENT: NCAT CV: RRR, good S1/S2, no murmur Resp: CTABL, no wheezes, non-labored Ext: No edema, warm Neuro: Alert and oriented, No gross deficits  MSK: BL knee without erythema, effusion, bruising, or gross deformity No joint line tenderness.  ligamentously intact to Lachman's and with varus and valgus stress.  Negative McMurray's test  Bilateral tenderness to palpation of paraspinal muscles in the lumbar area No midline tenderness   Plain film of the knees Left knee with medial compartment joint space narrowing  Assessment and plan:  # Pain with standing, cervical stenosis, knee pain, left knee osteoarthritis, R knee OA lumbar foraminal stenosis Multifactorial difficulty standing, she does well on her septicemia in clinic today, however understand this is probably episodic. She also has potential to have more difficulty after her upcoming surgery I agree with motorized recliner order, I will discuss with nursing have accomplish this. She has history of several back surgeries, neck surgeries, and has left knee arthritis. Her back and neck pain are at baseline.     Orders Placed This Encounter  Procedures  . DG Knee Bilateral Standing AP   Standing Status: Future     Number of Occurrences: 1     Standing Expiration Date: 09/15/2016    Order Specific Question:  Reason for Exam (SYMPTOM  OR DIAGNOSIS REQUIRED)    Answer:  bil knee pain    Order Specific Question:  Preferred imaging location?    Answer:  Internal    Laroy Apple, MD [facility] 07/17/2015, 5:33 PM

## 2015-07-18 ENCOUNTER — Telehealth: Payer: Self-pay

## 2015-07-18 NOTE — Telephone Encounter (Signed)
-----   Message from Timmothy Euler, MD sent at 07/18/2015  8:52 AM EDT ----- will you let her know that her knee x ray showed arthritic changes? We will work on the order for the chair Thanks! sam

## 2015-07-18 NOTE — Telephone Encounter (Signed)
Patient was informed.  She wants to let you know she has a chair picked out at [employer] in [place] and would prefer using them to get her chair.

## 2015-07-18 NOTE — Telephone Encounter (Signed)
[place], MD [facility] 07/18/2015, 11:57 AM

## 2015-07-19 NOTE — Telephone Encounter (Signed)
Spoke with patient concerning lift chair. She states that she hasn't spoken with anyone from her insurance yet, she is just going by the paperwork from them that she has. She states she has picked out a lift chair at [employer]. Advised patient that I would contact insurance and see what we needed to do to help her get a chair. Patient verbalized understanding

## 2015-07-20 NOTE — Telephone Encounter (Signed)
All information sent to [facility]. Per [employer] they will only cover a chair from a medical supply place. Left detailed message on patients machine

## 2015-07-22 DIAGNOSIS — S0181XA Laceration without foreign body of other part of head, initial encounter: Secondary | ICD-10-CM | POA: Diagnosis not present

## 2015-07-22 DIAGNOSIS — R569 Unspecified convulsions: Secondary | ICD-10-CM | POA: Diagnosis not present

## 2015-07-22 DIAGNOSIS — Z79899 Other long term (current) drug therapy: Secondary | ICD-10-CM | POA: Diagnosis not present

## 2015-07-22 DIAGNOSIS — S0091XA Abrasion of unspecified part of head, initial encounter: Secondary | ICD-10-CM | POA: Diagnosis not present

## 2015-07-22 DIAGNOSIS — Z885 Allergy status to narcotic agent status: Secondary | ICD-10-CM | POA: Diagnosis not present

## 2015-07-22 DIAGNOSIS — G40909 Epilepsy, unspecified, not intractable, without status epilepticus: Secondary | ICD-10-CM | POA: Diagnosis not present

## 2015-07-22 DIAGNOSIS — E872 Acidosis: Secondary | ICD-10-CM | POA: Diagnosis not present

## 2015-07-22 DIAGNOSIS — I10 Essential (primary) hypertension: Secondary | ICD-10-CM | POA: Diagnosis not present

## 2015-07-22 DIAGNOSIS — R55 Syncope and collapse: Secondary | ICD-10-CM | POA: Diagnosis not present

## 2015-07-22 DIAGNOSIS — S0101XA Laceration without foreign body of scalp, initial encounter: Secondary | ICD-10-CM | POA: Diagnosis not present

## 2015-07-22 DIAGNOSIS — F339 Major depressive disorder, recurrent, unspecified: Secondary | ICD-10-CM | POA: Diagnosis not present

## 2015-07-22 DIAGNOSIS — X58XXXA Exposure to other specified factors, initial encounter: Secondary | ICD-10-CM | POA: Diagnosis not present

## 2015-07-23 DIAGNOSIS — R55 Syncope and collapse: Secondary | ICD-10-CM | POA: Diagnosis not present

## 2015-07-26 ENCOUNTER — Telehealth: Payer: Self-pay

## 2015-07-26 NOTE — Telephone Encounter (Signed)
We should get records and see her prior to referral.  Laroy Apple, MD [facility] 07/26/2015, 12:15 PM

## 2015-07-26 NOTE — Telephone Encounter (Signed)
Patient aware that she will need to be seen and we will also needs records before referral can be made

## 2015-07-26 NOTE — Telephone Encounter (Signed)
Went to ER over the weekend for a seizure   Need to be referred to Neurologist

## 2015-08-01 ENCOUNTER — Ambulatory Visit: Payer: Medicare Other | Admitting: Family Medicine

## 2015-08-04 ENCOUNTER — Ambulatory Visit: Payer: Medicare Other | Admitting: Family Medicine

## 2015-08-07 ENCOUNTER — Encounter: Payer: Self-pay | Admitting: Family Medicine

## 2015-08-20 ENCOUNTER — Other Ambulatory Visit: Payer: Self-pay | Admitting: Family Medicine

## 2015-08-23 ENCOUNTER — Ambulatory Visit: Payer: Medicare Other | Admitting: Family Medicine

## 2015-08-25 ENCOUNTER — Encounter: Payer: Self-pay | Admitting: Family Medicine

## 2015-08-25 ENCOUNTER — Ambulatory Visit: Payer: Medicare Other | Admitting: Family Medicine

## 2015-08-28 ENCOUNTER — Encounter: Payer: Self-pay | Admitting: Family Medicine

## 2015-09-01 ENCOUNTER — Encounter: Payer: Self-pay | Admitting: Family Medicine

## 2015-09-01 ENCOUNTER — Ambulatory Visit (INDEPENDENT_AMBULATORY_CARE_PROVIDER_SITE_OTHER): Payer: Medicare Other | Admitting: Family Medicine

## 2015-09-01 VITALS — BP 97/61 | HR 66 | Temp 97.6°F | Ht 62.5 in | Wt 215.6 lb

## 2015-09-01 DIAGNOSIS — R569 Unspecified convulsions: Secondary | ICD-10-CM | POA: Insufficient documentation

## 2015-09-01 DIAGNOSIS — F1021 Alcohol dependence, in remission: Secondary | ICD-10-CM | POA: Diagnosis not present

## 2015-09-01 DIAGNOSIS — M25562 Pain in left knee: Secondary | ICD-10-CM | POA: Diagnosis not present

## 2015-09-01 MED ORDER — DICLOFENAC SODIUM 1 % TD GEL: 4.0000 g | Freq: Four times a day (QID) | TRANSDERMAL | 3 refills | Status: DC

## 2015-09-01 NOTE — Patient Instructions (Signed)
Great to see you!  I have written your referral, you will be called with an appointment  I refilled you voltaren gel

## 2015-09-01 NOTE — Progress Notes (Signed)
   HPI  Patient presents today here to request neurology referral for seizures.  Patient explains that she has had 2 seizures about 6 weeks ago. The first she fell and hit her forehead needing Dermabond to repair it. Then about 5 days later she had another episode.  She has not followed up with a neurologist for this.  She was seen in the emergency room in February for alcohol withdrawal, she explains that she has been an alcoholic for a long time, she states she "fell off the wagon" and then had withdrawal causing her to go to the hospital. She says since that time she's been completely free of alcohol. She went through inpatient detox at that time.  Knee and hip pain Bilateral lateral hip pain that radiates down to her ankle, this is positional when she lies on one side of the other in bed. It resolves after moving. She has very good relief of pain in the knee and the hip with Voltaren gel and requests refill  PMH: Smoking status noted ROS: Per HPI  Objective: BP 97/61   Pulse 66   Temp 97.6 F (36.4 C) (Oral)   Ht 5' 2.5" (1.588 m)   Wt 215 lb 9.6 oz (97.8 kg)   BMI 38.81 kg/m  Gen: NAD, alert, cooperative with exam HEENT: NCAT CV: RRR, good S1/S2, no murmur Resp: CTABL, no wheezes, non-labored Ext: No edema, warm Neuro: Alert and oriented, No gross deficits  Assessment and plan:  # Seizures Recent admission to the hospital, June 17, for hospital seizure. CT of the head was normal at that time. No EEG was performed. Discharging physician recommended neurology follow-up for evaluation of underlying seizure disorder. Records from [facility] have been scanned in.  # Hip and knee pain Refilled Voltaren gel  # Alcohol abuse, alcoholism In remission, congratulated her for her sobriety    Orders Placed This Encounter  Procedures  . Ambulatory referral to Neurology    Referral Priority:   Routine    Referral Type:   Consultation    Referral Reason:    Specialty Services Required    Requested Specialty:   Neurology    Number of Visits Requested:   1    Meds ordered this encounter  Medications  . traMADol (ULTRAM) 50 MG tablet  . DISCONTD: diclofenac sodium (VOLTAREN) 1 % GEL    Sig: Apply topically 4 (four) times daily.  . diclofenac sodium (VOLTAREN) 1 % GEL    Sig: Apply 4 g topically 4 (four) times daily.    Dispense:  100 g    Refill:  [place], MD [facility] 09/01/2015, 5:04 PM

## 2015-09-04 ENCOUNTER — Encounter: Payer: Self-pay | Admitting: Family Medicine

## 2015-09-12 ENCOUNTER — Ambulatory Visit: Payer: Medicare Other | Admitting: Family Medicine

## 2015-10-03 ENCOUNTER — Ambulatory Visit: Payer: Medicare Other | Admitting: Neurology

## 2015-10-04 ENCOUNTER — Encounter: Payer: Self-pay | Admitting: Neurology

## 2015-10-05 ENCOUNTER — Other Ambulatory Visit: Payer: Self-pay | Admitting: *Deleted

## 2015-10-05 MED ORDER — PRAMIPEXOLE DIHYDROCHLORIDE 0.5 MG PO TABS: 0.5000 mg | ORAL_TABLET | Freq: Every day | ORAL | 0 refills | Status: DC

## 2015-10-17 ENCOUNTER — Ambulatory Visit: Payer: Medicare Other | Admitting: Neurology

## 2015-10-18 ENCOUNTER — Encounter: Payer: Self-pay | Admitting: Neurology

## 2015-12-01 LAB — BASIC METABOLIC PANEL: Glucose: 197 mg/dL

## 2015-12-02 ENCOUNTER — Other Ambulatory Visit: Payer: Self-pay | Admitting: Family Medicine

## 2015-12-08 LAB — BASIC METABOLIC PANEL: GLUCOSE: 113 mg/dL

## 2016-01-23 ENCOUNTER — Ambulatory Visit: Payer: Medicare Other | Admitting: Family Medicine

## 2016-02-07 ENCOUNTER — Encounter: Payer: Self-pay | Admitting: Family Medicine

## 2016-02-07 ENCOUNTER — Ambulatory Visit (INDEPENDENT_AMBULATORY_CARE_PROVIDER_SITE_OTHER): Payer: Medicare Other | Admitting: Family Medicine

## 2016-02-07 VITALS — BP 121/69 | HR 51 | Temp 96.6°F | Ht 62.5 in | Wt 215.0 lb

## 2016-02-07 DIAGNOSIS — Z6838 Body mass index (BMI) 38.0-38.9, adult: Secondary | ICD-10-CM | POA: Diagnosis not present

## 2016-02-07 DIAGNOSIS — IMO0001 Reserved for inherently not codable concepts without codable children: Secondary | ICD-10-CM

## 2016-02-07 DIAGNOSIS — I10 Essential (primary) hypertension: Secondary | ICD-10-CM | POA: Diagnosis not present

## 2016-02-07 DIAGNOSIS — E6609 Other obesity due to excess calories: Secondary | ICD-10-CM

## 2016-02-07 DIAGNOSIS — F3177 Bipolar disorder, in partial remission, most recent episode mixed: Secondary | ICD-10-CM | POA: Diagnosis not present

## 2016-02-07 NOTE — Progress Notes (Signed)
   HPI  Patient presents today here to discuss chronic medical conditions and anxiety.  Patient has been seeing her psychiatrist for many years. She states that She feels like he is not hearing her. She has bipolar disorder treated with Lamictal. She is on 60 mg of Prozac. She is taking BuSpar 10 mg twice daily and does not feel like it's causing much improvement. Denies suicidal thoughts. Also denies depression. She has many family stressors currently.  Blood pressure is doing well.  Shows occupationally active but does not have a formal exercise routine.  Good medication compliance of blood pressure medications. No chest pain, dyspnea, palpitations, leg edema  PMH: Smoking status noted ROS: Per HPI  Objective: BP 121/69   Pulse (!) 51   Temp (!) 96.6 F (35.9 C) (Oral)   Ht 5' 2.5" (1.588 m)   Wt 215 lb (97.5 kg)   BMI 38.70 kg/m  Gen: NAD, alert, cooperative with exam HEENT: NCAT CV: RRR, good S1/S2, no murmur Resp: CTABL, no wheezes, non-labored Ext: No edema, warm Neuro: Alert and oriented, No gross deficits  Assessment and plan:  # Hypertension Well controlled on Prinzide, continue Fasting labs ordered  # Obesity Discussed therapeutic lifestyle changes, lipid panel and TSH included a fasting labs  # Bipolar disorder With anxiety Recommended talking to her psychiatrist. Patient requests benzodiazepines today. On review of her medical records she has history of overdose of sleeping pills, alcoholism in remission, and history of alcohol withdrawal which all make her slightly too high risk for primary care to supply benzodiazepines for her.     Orders Placed This Encounter  Procedures  . CMP14+EGFR    Standing Status:   Future    Standing Expiration Date:   02/06/2017  . CBC with Differential/Platelet    Standing Status:   Future    Standing Expiration Date:   02/06/2017  . Lipid panel    Standing Status:   Future    Standing Expiration Date:   02/06/2017  .  TSH    Standing Status:   Future    Standing Expiration Date:   02/06/2017    Laroy Apple, MD [facility] 02/07/2016, 5:17 PM

## 2016-02-07 NOTE — Patient Instructions (Addendum)
Great to see you!  Please come [place] within 1 week to get your labs drawn while you are fasting.

## 2016-03-06 DIAGNOSIS — F3181 Bipolar II disorder: Secondary | ICD-10-CM | POA: Diagnosis not present

## 2016-03-08 NOTE — Congregational Nurse Program (Signed)
Congregational Nurse Program Note  Date of Encounter: 03/08/2016  Past Medical History: Past Medical History:  Diagnosis Date  . Anemia   . Arthritis    back   . Asthma    slight  . Chronic back pain   . Depression   . Depression   . Edema   . Hyperlipidemia   . Hypertension   . Insomnia   . Morbid obesity ([place])   . Panic attacks   . Peripheral neuropathy ([place])   . PONV (postoperative nausea and vomiting)   . Restless leg syndrome   . Seizures ([place])    hx of seizure 05/2013   . Sleep apnea    has not used CPAP in 3 years     Encounter Details:     CNP Questionnaire - 03/07/16 1040      Patient Demographics   Is this a new or existing patient? Existing   Patient is considered a/an Not Applicable   Race Caucasian/White     Patient Assistance   Location of Patient Assistance [place]   Patient's financial/insurance status Private Insurance Coverage   Uninsured Patient ([place]/Care Connects) No   Patient referred to apply for the following financial assistance Not Applicable   Food insecurities addressed Not Applicable   Transportation assistance No   Assistance securing medications No   Educational Naval architect health;Nutrition;Safety     Encounter Details   Primary purpose of visit Education/Health Concerns;Safety   Was an Emergency Department visit averted? Not Applicable   Does patient have a medical provider? Yes   Patient referred to Not Applicable   Was a mental health screening completed? (GAINS tool) No   Does patient have dental issues? No   Does patient have vision issues? No   Does your patient have an abnormal blood pressure today? No   Since previous encounter, have you referred patient for abnormal blood pressure that resulted in a new diagnosis or medication change? No   Does your patient have an abnormal blood glucose today? No   Since previous encounter, have you referred patient for abnormal blood glucose that  resulted in a new diagnosis or medication change? No   Was there a life-saving intervention made? No    03/07/16 Check B/P. B/P 94/66 Pulse 59.  States " B/P runs low, takes antidepressive meds . And B/P meds". Sees Dr. Wendi Snipes @ [facility] no other problems. Flu Vaccine 0.27ml given (left deltoid). Theron Arista, RN  [phone]

## 2016-03-12 ENCOUNTER — Ambulatory Visit (INDEPENDENT_AMBULATORY_CARE_PROVIDER_SITE_OTHER): Payer: Medicare Other | Admitting: Family Medicine

## 2016-03-12 ENCOUNTER — Encounter: Payer: Self-pay | Admitting: Family Medicine

## 2016-03-12 VITALS — BP 75/56 | HR 83 | Temp 98.2°F | Ht 62.5 in | Wt 212.0 lb

## 2016-03-12 DIAGNOSIS — B349 Viral infection, unspecified: Secondary | ICD-10-CM

## 2016-03-12 MED ORDER — LISINOPRIL-HYDROCHLOROTHIAZIDE 10-12.5 MG PO TABS: 1.0000 | ORAL_TABLET | Freq: Every morning | ORAL | 0 refills | Status: DC

## 2016-03-12 MED ORDER — NYSTATIN 100000 UNIT/GM EX CREA: 1.0000 "application " | TOPICAL_CREAM | Freq: Two times a day (BID) | CUTANEOUS | 0 refills | Status: DC

## 2016-03-12 MED ORDER — DICLOFENAC SODIUM 1 % TD GEL: 4.0000 g | Freq: Four times a day (QID) | TRANSDERMAL | 0 refills | Status: DC

## 2016-03-12 MED ORDER — OSELTAMIVIR PHOSPHATE 75 MG PO CAPS: 75.0000 mg | ORAL_CAPSULE | Freq: Two times a day (BID) | ORAL | 0 refills | Status: DC

## 2016-03-12 NOTE — Progress Notes (Signed)
Subjective:    Patient ID: Tamara Mccullough, female    DOB: 1955/03/16, 60 y.o.   MRN: [id number]  HPI Patient here today for flu like symptoms that started Saturday and a rash. Complains of myalgias chills fever cough congestion and fullness in her ears. She also has chronic intertrigo.   Patient Active Problem List   Diagnosis Date Noted  . Seizures ([place]) 09/01/2015  . Lumbar foraminal stenosis 07/17/2015  . OA (osteoarthritis) of knee 07/17/2015  . Healthcare maintenance 04/05/2015  . Breast cancer screening 04/05/2015  . Alcohol dependence with withdrawal with complication ([place])   . Bipolar disorder with severe depression ([place]) 03/24/2015  . Left knee pain 10/31/2014  . Insomnia due to anxiety and fear 12/20/2013  . Cervical stenosis of spinal canal 11/09/2013  . RLS (restless legs syndrome) 08/09/2013  . Multinodular goiter 06/09/2013  . Alcoholism in remission ([place]) 03/15/2013  . Overdose of sleeping tabs 03/10/2013  . Essential hypertension, benign 05/20/2012  . GAD (generalized anxiety disorder) 05/20/2012  . Hyperlipidemia with target LDL less than 100 05/20/2012  . Bipolar disorder ([place]) 05/20/2012  . Anemia, vitamin B12 deficiency 07/06/2009  . Obesity 06/29/2009  . PERSONAL HX COLONIC POLYPS 06/29/2009   Outpatient Encounter Prescriptions as of 03/12/2016  Medication Sig  . busPIRone (BUSPAR) 10 MG tablet Take 2 tablets by mouth 2 (two) times daily.  . diclofenac sodium (VOLTAREN) 1 % GEL Apply 4 g topically 4 (four) times daily.  Marland Kitchen doxepin (SINEQUAN) 50 MG capsule Take 2 capsules by mouth daily.  Marland Kitchen FLUoxetine (PROZAC) 20 MG capsule Take 3 capsules (60 mg total) by mouth every morning.  . hydrOXYzine (ATARAX/VISTARIL) 25 MG tablet Take 25 mg by mouth every 4 (four) hours as needed.  . lamoTRIgine (LAMICTAL) 150 MG tablet Take 1 tablet (150 mg total) by mouth every morning.  Marland Kitchen lisinopril-hydrochlorothiazide (PRINZIDE,ZESTORETIC) 10-12.5 MG tablet TAKE 1 TABLET BY  MOUTH EVERY MORNING.  . [DISCONTINUED] busPIRone (BUSPAR) 5 MG tablet Take 10 mg by mouth 2 (two) times daily.    No facility-administered encounter medications on file as of 03/12/2016.       Review of Systems  Constitutional: Positive for chills and fever.  HENT: Positive for congestion and ear pain (ears feel full).   Eyes: Negative.   Respiratory: Positive for cough.   Cardiovascular: Negative.   Gastrointestinal: Positive for diarrhea.  Endocrine: Negative.   Genitourinary: Negative.   Musculoskeletal: Positive for myalgias.  Skin: Negative.   Allergic/Immunologic: Negative.   Neurological: Negative.   Hematological: Negative.   Psychiatric/Behavioral: Negative.        Objective:   Physical Exam  Constitutional: She is oriented to person, place, and time. She appears well-developed and well-nourished.  HENT:  Mouth/Throat: Oropharynx is clear and moist.  Cardiovascular: Normal rate, regular rhythm and normal heart sounds.   Pulmonary/Chest: Effort normal and breath sounds normal.  Neurological: She is alert and oriented to person, place, and time.  Skin: There is erythema (under right breast consistent with monilia).   BP (!) 75/56 (BP Location: Left Arm)   Pulse 83   Temp 98.2 F (36.8 C) (Oral)   Ht 5' 2.5" (1.588 m)   Wt 212 lb (96.2 kg)   BMI 38.16 kg/m         Assessment & Plan:  1. Viral syndrome Symptoms are suggestive of fluid. Symptoms present 3 days. We'll go ahead and treat with Tamiflu patient will decide whether or not to get the medicine once  she finds out Price at the pharmacy. Otherwise general measures rest fluids Tylenol or ibuprofen for fever and myalgias  Wardell Honour MD

## 2016-04-22 ENCOUNTER — Ambulatory Visit (INDEPENDENT_AMBULATORY_CARE_PROVIDER_SITE_OTHER): Payer: Medicare Other

## 2016-04-22 ENCOUNTER — Ambulatory Visit (INDEPENDENT_AMBULATORY_CARE_PROVIDER_SITE_OTHER): Payer: Medicare Other | Admitting: Family Medicine

## 2016-04-22 ENCOUNTER — Encounter: Payer: Self-pay | Admitting: Family Medicine

## 2016-04-22 VITALS — BP 95/58 | HR 68 | Temp 97.4°F | Ht 62.5 in | Wt 211.4 lb

## 2016-04-22 DIAGNOSIS — M79672 Pain in left foot: Secondary | ICD-10-CM

## 2016-04-22 DIAGNOSIS — IMO0001 Reserved for inherently not codable concepts without codable children: Secondary | ICD-10-CM

## 2016-04-22 DIAGNOSIS — E6609 Other obesity due to excess calories: Secondary | ICD-10-CM

## 2016-04-22 DIAGNOSIS — I10 Essential (primary) hypertension: Secondary | ICD-10-CM | POA: Diagnosis not present

## 2016-04-22 DIAGNOSIS — Z6838 Body mass index (BMI) 38.0-38.9, adult: Secondary | ICD-10-CM | POA: Diagnosis not present

## 2016-04-22 MED ORDER — HYDROCHLOROTHIAZIDE 12.5 MG PO CAPS: 12.5000 mg | ORAL_CAPSULE | Freq: Every day | ORAL | 1 refills | Status: DC

## 2016-04-22 NOTE — Patient Instructions (Signed)
Great to see you!  We will call with labs within  1 week  Stop lisinorpil/HCTZ, start HCTZ

## 2016-04-22 NOTE — Progress Notes (Signed)
   HPI  Patient presents today here to follow-up for chronic medical conditions as well as left foot pain.  Hypertension Generally 52W and 413 systolic, checked by nurse at work. Good medication compliance. Noted dizziness.  Obesity Patient losing weight. Doing well.  Left foot pain Patient states this has been going on for about one year now. She explains that she developed a knot on the top of her foot that was tender, now she is gradually been developing a knot in the middle of her arch that's just below the knot on top of her foot. She denies any trauma. She states that 400 mg of ibuprofen and rest helps. After about 2 hours of work she gets to have pain. She states that by the end of the day she can only walk with a limp. She tried some insoles which was a change in her balance which she did not feel stable with. She has good supportive shoes she wears all day  PMH: Smoking status noted ROS: Per HPI  Objective: BP (!) 95/58   Pulse 68   Temp 97.4 F (36.3 C) (Oral)   Ht 5' 2.5" (1.588 m)   Wt 211 lb 6.4 oz (95.9 kg)   BMI 38.05 kg/m  Gen: NAD, alert, cooperative with exam HEENT: NCAT, EOMI, PERRL CV: RRR, good S1/S2, no murmur Resp: CTABL, no wheezes, non-labored Ext: No edema, warm Neuro: Alert and oriented, No gross deficits  MSK No tenderness to palpation over the bony prominence in the midfoot on the dorsal side, she also has a thickening/subcutaneous nodule palpable in the mid left arch on the plantar surface. Mild tenderness to palpation of this.  Plain film pending  Assessment and plan:  # Left foot pain Patient with unusual bony prominence as well as nodule developing in the midfoot on the plantar surface in the middle part of her arch. Continue supportive shoes Continue NSAIDs- [place] taking 400 mg ibuprofen twice daily. Refer to podiatry, plain film ordered as well     Orders Placed This Encounter  Procedures  . DG Foot Complete Left   Standing Status:   Future    Standing Expiration Date:   06/22/2017    Order Specific Question:   Reason for Exam (SYMPTOM  OR DIAGNOSIS REQUIRED)    Answer:   L midfoot pain    Order Specific Question:   Preferred imaging location?    Answer:   Internal  . Ambulatory referral to Podiatry    Referral Priority:   Routine    Referral Type:   Consultation    Referral Reason:   Specialty Services Required    Requested Specialty:   Podiatry    Number of Visits Requested:   1    Meds ordered this encounter  Medications  . hydrochlorothiazide (MICROZIDE) 12.5 MG capsule    Sig: Take 1 capsule (12.5 mg total) by mouth daily.    Dispense:  90 capsule    Refill:  [place], MD [facility] 04/22/2016, 9:52 AM

## 2016-04-23 LAB — CBC WITH DIFFERENTIAL/PLATELET
BASOS ABS: 0 10*3/uL (ref 0.0–0.2)
Basos: 1 %
EOS (ABSOLUTE): 0.2 10*3/uL (ref 0.0–0.4)
Eos: 3 %
Hematocrit: 38.8 % (ref 34.0–46.6)
Hemoglobin: 12.6 g/dL (ref 11.1–15.9)
Immature Grans (Abs): 0 10*3/uL (ref 0.0–0.1)
Immature Granulocytes: 0 %
LYMPHS ABS: 2 10*3/uL (ref 0.7–3.1)
Lymphs: 36 %
MCH: 29.5 pg (ref 26.6–33.0)
MCHC: 32.5 g/dL (ref 31.5–35.7)
MCV: 91 fL (ref 79–97)
MONOS ABS: 0.4 10*3/uL (ref 0.1–0.9)
Monocytes: 7 %
NEUTROS ABS: 3 10*3/uL (ref 1.4–7.0)
Neutrophils: 53 %
PLATELETS: 213 10*3/uL (ref 150–379)
RBC: 4.27 x10E6/uL (ref 3.77–5.28)
RDW: 14.5 % (ref 12.3–15.4)
WBC: 5.6 10*3/uL (ref 3.4–10.8)

## 2016-04-23 LAB — CMP14+EGFR
ALK PHOS: 85 IU/L (ref 39–117)
ALT: 19 IU/L (ref 0–32)
AST: 23 IU/L (ref 0–40)
Albumin/Globulin Ratio: 2 (ref 1.2–2.2)
Albumin: 4.2 g/dL (ref 3.6–4.8)
BUN / CREAT RATIO: 23 (ref 12–28)
BUN: 22 mg/dL (ref 8–27)
Bilirubin Total: 0.3 mg/dL (ref 0.0–1.2)
CHLORIDE: 103 mmol/L (ref 96–106)
CO2: 24 mmol/L (ref 18–29)
CREATININE: 0.95 mg/dL (ref 0.57–1.00)
Calcium: 8.5 mg/dL — ABNORMAL LOW (ref 8.7–10.3)
GFR calc non Af Amer: 64 mL/min/{1.73_m2} (ref >59)
GFR, EST AFRICAN AMERICAN: 74 mL/min/{1.73_m2} (ref >59)
GLUCOSE: 88 mg/dL (ref 65–99)
Globulin, Total: 2.1 g/dL (ref 1.5–4.5)
POTASSIUM: 4.9 mmol/L (ref 3.5–5.2)
Sodium: 143 mmol/L (ref 134–144)
TOTAL PROTEIN: 6.3 g/dL (ref 6.0–8.5)

## 2016-04-23 LAB — LIPID PANEL
CHOLESTEROL TOTAL: 214 mg/dL — AB (ref 100–199)
Chol/HDL Ratio: 3.8 ratio units (ref 0.0–4.4)
HDL: 57 mg/dL (ref >39)
LDL Calculated: 133 mg/dL — ABNORMAL HIGH (ref 0–99)
Triglycerides: 120 mg/dL (ref 0–149)
VLDL Cholesterol Cal: 24 mg/dL (ref 5–40)

## 2016-04-23 LAB — TSH: TSH: 7.54 u[IU]/mL — ABNORMAL HIGH (ref 0.450–4.500)

## 2016-04-24 MED ORDER — LEVOTHYROXINE SODIUM 50 MCG PO TABS: 50.0000 ug | ORAL_TABLET | Freq: Every day | ORAL | 5 refills | Status: DC

## 2016-04-24 NOTE — Addendum Note (Signed)
Addended by: Karle Plumber on: 04/24/2016 12:33 PM   Modules accepted: Orders

## 2016-05-02 NOTE — Congregational Nurse Program (Unsigned)
Congregational Nurse Program Note  Date of Encounter: 05/02/2016  Past Medical History: Past Medical History:  Diagnosis Date  . Anemia   . Arthritis    back   . Asthma    slight  . Chronic back pain   . Depression   . Depression   . Edema   . Hyperlipidemia   . Hypertension   . Insomnia   . Morbid obesity ([place])   . Panic attacks   . Peripheral neuropathy ([place])   . PONV (postoperative nausea and vomiting)   . Restless leg syndrome   . Seizures ([place])    hx of seizure 05/2013   . Sleep apnea    has not used CPAP in 3 years     Encounter Details:     CNP Questionnaire - 04/26/16 1305      Patient Demographics   Is this a new or existing patient? Existing   Race Caucasian/White     Patient Assistance   Location of Patient Assistance [place]   Patient's financial/insurance status Medicare   Patient referred to apply for the following financial assistance Not Applicable   Food insecurities addressed Provided food supplies   Transportation assistance No   Assistance securing medications No     Encounter Details   Primary purpose of visit Education/Health Concerns   Was an Emergency Department visit averted? Not Applicable   Does patient have a medical provider? Yes   Patient referred to Not Applicable   Was a mental health screening completed? (GAINS tool) No   Does patient have dental issues? No   Does patient have vision issues? No   Does your patient have an abnormal blood pressure today? No   Since previous encounter, have you referred patient for abnormal blood pressure that resulted in a new diagnosis or medication change? No   Does your patient have an abnormal blood glucose today? No   Since previous encounter, have you referred patient for abnormal blood glucose that resulted in a new diagnosis or medication change? No   Was there a life-saving intervention made? No    3/23/ 18  BP runs low want it checked.  BP 116/63 Pulse 60  Blood Sugar  non-fasting 127.

## 2016-05-03 DIAGNOSIS — J40 Bronchitis, not specified as acute or chronic: Secondary | ICD-10-CM | POA: Diagnosis not present

## 2016-05-03 DIAGNOSIS — R062 Wheezing: Secondary | ICD-10-CM | POA: Diagnosis not present

## 2016-05-03 DIAGNOSIS — R05 Cough: Secondary | ICD-10-CM | POA: Diagnosis not present

## 2016-05-08 ENCOUNTER — Ambulatory Visit: Payer: Medicare Other

## 2016-05-14 ENCOUNTER — Ambulatory Visit: Payer: Medicare Other

## 2016-05-16 DIAGNOSIS — R300 Dysuria: Secondary | ICD-10-CM | POA: Diagnosis not present

## 2016-05-16 DIAGNOSIS — N3001 Acute cystitis with hematuria: Secondary | ICD-10-CM | POA: Diagnosis not present

## 2016-05-20 ENCOUNTER — Ambulatory Visit: Payer: Medicare Other

## 2016-05-23 ENCOUNTER — Ambulatory Visit: Payer: Medicare Other

## 2016-05-24 ENCOUNTER — Encounter: Payer: Self-pay | Admitting: Family Medicine

## 2016-05-30 ENCOUNTER — Ambulatory Visit: Payer: Medicare Other | Admitting: Family Medicine

## 2016-06-06 ENCOUNTER — Ambulatory Visit: Payer: Medicare Other | Admitting: Family Medicine

## 2016-06-11 ENCOUNTER — Ambulatory Visit: Payer: Medicare Other

## 2016-06-24 ENCOUNTER — Other Ambulatory Visit: Payer: Self-pay | Admitting: Family Medicine

## 2016-06-24 MED ORDER — HYDROCHLOROTHIAZIDE 12.5 MG PO CAPS: 12.5000 mg | ORAL_CAPSULE | Freq: Every day | ORAL | 0 refills | Status: DC

## 2016-07-11 DIAGNOSIS — S61511A Laceration without foreign body of right wrist, initial encounter: Secondary | ICD-10-CM | POA: Diagnosis not present

## 2016-07-11 DIAGNOSIS — S8001XA Contusion of right knee, initial encounter: Secondary | ICD-10-CM | POA: Diagnosis not present

## 2016-07-11 DIAGNOSIS — M25561 Pain in right knee: Secondary | ICD-10-CM | POA: Diagnosis not present

## 2016-07-30 DIAGNOSIS — F3181 Bipolar II disorder: Secondary | ICD-10-CM | POA: Diagnosis not present

## 2016-08-22 ENCOUNTER — Ambulatory Visit (INDEPENDENT_AMBULATORY_CARE_PROVIDER_SITE_OTHER): Payer: Medicare Other | Admitting: Family Medicine

## 2016-08-22 ENCOUNTER — Encounter: Payer: Self-pay | Admitting: Family Medicine

## 2016-08-22 VITALS — BP 112/70 | HR 52 | Temp 98.1°F | Resp 16 | Ht 63.0 in | Wt 210.0 lb

## 2016-08-22 DIAGNOSIS — Z86010 Personal history of colonic polyps: Secondary | ICD-10-CM

## 2016-08-22 DIAGNOSIS — E559 Vitamin D deficiency, unspecified: Secondary | ICD-10-CM | POA: Diagnosis not present

## 2016-08-22 DIAGNOSIS — Z1231 Encounter for screening mammogram for malignant neoplasm of breast: Secondary | ICD-10-CM | POA: Diagnosis not present

## 2016-08-22 DIAGNOSIS — E890 Postprocedural hypothyroidism: Secondary | ICD-10-CM

## 2016-08-22 DIAGNOSIS — Z1239 Encounter for other screening for malignant neoplasm of breast: Secondary | ICD-10-CM

## 2016-08-22 DIAGNOSIS — Z9884 Bariatric surgery status: Secondary | ICD-10-CM

## 2016-08-22 MED ORDER — HYDROCHLOROTHIAZIDE 12.5 MG PO CAPS: 12.5000 mg | ORAL_CAPSULE | Freq: Every day | ORAL | 3 refills | Status: AC

## 2016-08-22 MED ORDER — KETOCONAZOLE 2 % EX CREA: 1.0000 "application " | TOPICAL_CREAM | Freq: Every day | CUTANEOUS | 1 refills | Status: DC

## 2016-08-22 MED ORDER — DICLOFENAC SODIUM 1 % TD GEL: 4.0000 g | Freq: Four times a day (QID) | TRANSDERMAL | 2 refills | Status: DC

## 2016-08-22 NOTE — Patient Instructions (Signed)
Stay as active as you can manage Need labs today Mammogram ordered I will review records  See me in one month for a physical Call sooner for problems  It is important for you to call the office if you are unable to keep any appointment

## 2016-08-22 NOTE — Progress Notes (Signed)
Chief Complaint  Patient presents with  . Hypertension   Complicated patient with multiple diagnoses and medicines who is here to establish care.  Prior care is in record, through family medicine at [facility].  She was discharged from their care for noncompliance with keeping appointments.  I note also no shows to specialty providers.  I discussed this with her.  She understands that not showing up for an appointment here will also result in termination from my care.  Stressed the importance of keeping all scheduled appointments, and if unable for good reason, she must call the office.  She blames her mental illness.  She is under the care of Dr Reece Levy in psychiatry.  She states she is currently stable. She has chronic neck and back pain from disc disease, injuries and surgeries. She is obese - but improved over her prior weight due to gastric bypass surgery. Hypertension and hyperlipidemia controlled Alcohol and drug abuse - currently abstaining since 2015   Patient Active Problem List   Diagnosis Date Noted  . Gastric bypass status for obesity 08/22/2016  . Seizures ([place]) 09/01/2015  . Lumbar foraminal stenosis 07/17/2015  . OA (osteoarthritis) of knee 07/17/2015  . Healthcare maintenance 04/05/2015  . Breast cancer screening 04/05/2015  . Alcohol dependence with withdrawal with complication ([place])   . Bipolar disorder with severe depression ([place]) 03/24/2015  . Left knee pain 10/31/2014  . Insomnia due to anxiety and fear 12/20/2013  . Cervical stenosis of spinal canal 11/09/2013  . RLS (restless legs syndrome) 08/09/2013  . Multinodular goiter 06/09/2013  . Alcoholism in remission ([place]) 03/15/2013  . Overdose of sleeping tabs 03/10/2013  . Essential hypertension, benign 05/20/2012  . GAD (generalized anxiety disorder) 05/20/2012  . Hyperlipidemia with target LDL less than 100 05/20/2012  . Bipolar disorder ([place]) 05/20/2012  . Anemia, vitamin B12 deficiency 07/06/2009  .  Obesity 06/29/2009  . PERSONAL HX COLONIC POLYPS 06/29/2009    Outpatient Encounter Prescriptions as of 08/22/2016  Medication Sig  . busPIRone (BUSPAR) 10 MG tablet Take 2 tablets by mouth 2 (two) times daily.  . diclofenac sodium (VOLTAREN) 1 % GEL Apply 4 g topically 4 (four) times daily.  Marland Kitchen FLUoxetine (PROZAC) 20 MG capsule Take 3 capsules (60 mg total) by mouth every morning. (Patient taking differently: Take 60 mg by mouth every morning. 2 in am    1 in eve)  . hydrochlorothiazide (MICROZIDE) 12.5 MG capsule Take 1 capsule (12.5 mg total) by mouth daily.  . hydrOXYzine (ATARAX/VISTARIL) 25 MG tablet Take 25 mg by mouth every 4 (four) hours as needed.  . lamoTRIgine (LAMICTAL) 150 MG tablet Take 1 tablet (150 mg total) by mouth every morning.  Marland Kitchen levothyroxine (SYNTHROID, LEVOTHROID) 50 MCG tablet Take 1 tablet (50 mcg total) by mouth daily.  Marland Kitchen nystatin cream (MYCOSTATIN) APPLY 1 APPLICATION TOPICALLY 2 (TWO) TIMES DAILY.  Marland Kitchen ketoconazole (NIZORAL) 2 % cream Apply 1 application topically daily.   No facility-administered encounter medications on file as of 08/22/2016.     Past Medical History:  Diagnosis Date  . Allergy   . Anemia   . Arthritis    back   . Asthma    slight  . Chronic back pain   . Depression   . Depression   . Edema   . Hyperlipidemia   . Hypertension   . Insomnia   . Morbid obesity ([place])   . Neuromuscular disorder ([place])   . Panic attacks   . PONV (postoperative  nausea and vomiting)   . Restless leg syndrome   . Seizures ([place])    hx of seizure 05/2013   . Sleep apnea    has not used CPAP in 3 years   . Thyroid disease     Past Surgical History:  Procedure Laterality Date  . ANTERIOR CERVICAL DECOMP/DISCECTOMY FUSION N/A 11/09/2013   Procedure:  Anterior cervical decompression/diskectomy/fusion cervical four-five ,cervical five-six,cervical six-seven;  Surgeon: Floyce Stakes, MD;  Location: [facility];  Service: Neurosurgery;  Laterality: N/A;  .  BACK SURGERY    . CTR    . gastric by pass  6/11  . MINOR HEMORRHOIDECTOMY    . SPINE SURGERY    . THYROID LOBECTOMY Right 08/12/2013   Procedure: RIGHT THYROID LOBECTOMY;  Surgeon: Odis Hollingshead, MD;  Location: [facility];  Service: General;  Laterality: Right;  . TUBAL LIGATION      Social History   Social History  . Marital status: Divorced    Spouse name: N/A  . Number of children: 1  . Years of education: 10   Occupational History  . disability     due to back, depression/bipolar   Social History Main Topics  . Smoking status: Former Smoker    Quit date: 02/05/1996  . Smokeless tobacco: Never Used  . Alcohol use No     Comment: sober since 56  . Drug use: Yes    Types: Benzodiazepines     Comment: in the 70/'s  . Sexual activity: No   Other Topics Concern  . Not on file   Social History Narrative   Disabled as heavy equip operator   Lives alone   Has a caregiver 3 d a week       Family History  Problem Relation Age of Onset  . Congestive Heart Failure Mother   . Diabetes Mother        died at 35  . Cancer Mother        cervical  . Arthritis Mother   . Asthma Mother   . Depression Mother   . Heart disease Mother   . Hypertension Mother   . Congestive Heart Failure Father   . Emphysema Father   . Alcohol abuse Father   . Diabetes Father   . Arthritis Father   . COPD Father   . Depression Father   . Heart disease Father        heart attack died at 16  . Hypertension Father   . Stroke Father   . Alcohol abuse Sister   . Alcohol abuse Brother   . Alcohol abuse Paternal Grandfather   . Alcohol abuse Brother   . Alcohol abuse Brother     Review of Systems  Constitutional: Positive for malaise/fatigue. Negative for chills, fever and weight loss.  HENT: Negative for congestion and hearing loss.   Eyes: Negative for blurred vision and pain.  Respiratory: Negative for cough and shortness of breath.   Cardiovascular: Negative for chest pain and leg  swelling.  Gastrointestinal: Negative for abdominal pain, constipation, diarrhea and heartburn.  Genitourinary: Negative for dysuria and frequency.  Musculoskeletal: Positive for back pain and neck pain. Negative for falls, joint pain and myalgias.  Neurological: Negative for dizziness, seizures and headaches.  Psychiatric/Behavioral: Positive for depression. The patient is nervous/anxious and has insomnia.     BP 112/70 (BP Location: Right Arm, Patient Position: Sitting, Cuff Size: Normal)   Pulse (!) 52   Temp 98.1 F (36.7  C) (Temporal)   Resp 16   Ht 5\' 3"  (1.6 m)   Wt 210 lb (95.3 kg)   SpO2 98%   BMI 37.20 kg/m   Physical Exam  Constitutional: She is oriented to person, place, and time. She appears well-developed and well-nourished.  HENT:  Head: Normocephalic and atraumatic.  Mouth/Throat: Oropharynx is clear and moist.  Eyes: Pupils are equal, round, and reactive to light. Conjunctivae are normal.  Neck: Normal range of motion. Neck supple.  Cardiovascular: Normal rate, regular rhythm and normal heart sounds.   Pulmonary/Chest: Effort normal and breath sounds normal. No respiratory distress.  Abdominal: Soft. Bowel sounds are normal.  Musculoskeletal: Normal range of motion. She exhibits no edema.  Neurological: She is alert and oriented to person, place, and time.  Gait , mild limp. Slow movements  Skin: Skin is warm and dry.  Psychiatric: She has a normal mood and affect. Her behavior is normal. Thought content normal.  Mild anxiety  Nursing note and vitals reviewed.  ASSESSMENT/PLAN:  1. Gastric bypass status for obesity - VITAMIN D 25 Hydroxy (Vit-D Deficiency, Fractures) - Urinalysis, Routine w reflex microscopic  2. PERSONAL HX COLONIC POLYPS  3. Breast cancer screening - MM Digital Screening; Future  4. Postoperative hypothyroidism - TSH  5. Vitamin D deficiency - VITAMIN D 25 Hydroxy (Vit-D Deficiency, Fractures)  Greater than 50% of this visit  was spent in counseling and coordinating care.  Total face to face time:  45 min.  Spent in chart review, discussing mental illness and addictions, discussing importance complaince  Patient Instructions  Stay as active as you can manage Need labs today Mammogram ordered I will review records  See me in one month for a physical Call sooner for problems  It is important for you to call the office if you are unable to keep any appointment    Tamara Everts, MD

## 2016-08-23 LAB — URINALYSIS, ROUTINE W REFLEX MICROSCOPIC
Bilirubin Urine: NEGATIVE
Glucose, UA: NEGATIVE
Hgb urine dipstick: NEGATIVE
NITRITE: NEGATIVE
Protein, ur: NEGATIVE
SPECIFIC GRAVITY, URINE: 1.031 (ref 1.001–1.035)
pH: 6 (ref 5.0–8.0)

## 2016-08-23 LAB — URINALYSIS, MICROSCOPIC ONLY: Yeast: NONE SEEN [HPF]

## 2016-08-23 LAB — VITAMIN D 25 HYDROXY (VIT D DEFICIENCY, FRACTURES): Vit D, 25-Hydroxy: 29 ng/mL — ABNORMAL LOW (ref 30–100)

## 2016-08-23 LAB — TSH: TSH: 2.26 m[IU]/L

## 2016-09-16 ENCOUNTER — Encounter: Payer: Self-pay | Admitting: Family Medicine

## 2016-10-04 ENCOUNTER — Other Ambulatory Visit: Payer: Self-pay | Admitting: Family Medicine

## 2016-10-04 ENCOUNTER — Encounter: Payer: Self-pay | Admitting: Family Medicine

## 2016-10-04 DIAGNOSIS — Z1231 Encounter for screening mammogram for malignant neoplasm of breast: Secondary | ICD-10-CM

## 2016-10-08 ENCOUNTER — Encounter: Payer: Self-pay | Admitting: Family Medicine

## 2016-10-14 ENCOUNTER — Ambulatory Visit (HOSPITAL_COMMUNITY)
Admission: RE | Admit: 2016-10-14 | Discharge: 2016-10-14 | Disposition: A | Discharge status: Home / Self Care | Payer: Medicare Other | Source: Ambulatory Visit | Attending: Family Medicine | Admitting: Family Medicine

## 2016-10-14 DIAGNOSIS — N6489 Other specified disorders of breast: Secondary | ICD-10-CM | POA: Insufficient documentation

## 2016-10-14 DIAGNOSIS — R928 Other abnormal and inconclusive findings on diagnostic imaging of breast: Secondary | ICD-10-CM | POA: Insufficient documentation

## 2016-10-14 DIAGNOSIS — Z1231 Encounter for screening mammogram for malignant neoplasm of breast: Secondary | ICD-10-CM | POA: Insufficient documentation

## 2016-10-18 ENCOUNTER — Other Ambulatory Visit: Payer: Self-pay | Admitting: Family Medicine

## 2016-10-18 ENCOUNTER — Encounter: Payer: Self-pay | Admitting: Family Medicine

## 2016-10-20 NOTE — Congregational Nurse Program (Unsigned)
Congregational Nurse Program Note  Date of Encounter: 10/20/2016  Past Medical History: Past Medical History:  Diagnosis Date  . Allergy   . Anemia   . Arthritis    back   . Asthma    slight  . Chronic back pain   . Depression   . Depression   . Edema   . Hyperlipidemia   . Hypertension   . Insomnia   . Morbid obesity ([place])   . Neuromuscular disorder ([place])   . Panic attacks   . PONV (postoperative nausea and vomiting)   . Restless leg syndrome   . Seizures ([place])    hx of seizure 05/2013   . Sleep apnea    has not used CPAP in 3 years   . Thyroid disease     Encounter Details:     CNP Questionnaire - 10/17/16 1030      Patient Demographics   Is this a new or existing patient? Existing   Patient is considered a/an Not Applicable   Race Caucasian/White     Patient Assistance   Location of Patient Assistance [address] [id number]   Patient's financial/insurance status Private Insurance Coverage   Uninsured Patient ([place] Card/Care Connects) No   Patient referred to apply for the following financial assistance Not Applicable   Food insecurities addressed Not Applicable   Transportation assistance No   Assistance securing medications No   Educational health offerings Health literacy     Encounter Details   Primary purpose of visit Education/Health Concerns   Was an Emergency Department visit averted? Not Applicable   Does patient have a medical provider? Yes   Patient referred to Not Applicable   Was a mental health screening completed? (GAINS tool) No   Does patient have dental issues? No   Does patient have vision issues? Yes   Was a vision referral made? Yes  Wears glasses   Does your patient have an abnormal blood pressure today? No   Since previous encounter, have you referred patient for abnormal blood pressure that resulted in a new diagnosis or medication change? No   Does your patient have an abnormal blood glucose today? No   Since previous encounter, have  you referred patient for abnormal blood glucose that resulted in a new diagnosis or medication change? No   Was there a life-saving intervention made? No    Felt dizzy yesterday  B/P 113/67 Pulse 59. Blood sugar 100.  Taking her meds as ordered. Theron Arista, RN [phone].

## 2016-10-21 ENCOUNTER — Other Ambulatory Visit: Payer: Self-pay | Admitting: Family Medicine

## 2016-10-21 DIAGNOSIS — R928 Other abnormal and inconclusive findings on diagnostic imaging of breast: Secondary | ICD-10-CM

## 2016-10-22 ENCOUNTER — Encounter (HOSPITAL_COMMUNITY): Payer: Self-pay

## 2016-10-22 ENCOUNTER — Ambulatory Visit (HOSPITAL_COMMUNITY)
Admission: RE | Admit: 2016-10-22 | Discharge: 2016-10-22 | Disposition: A | Discharge status: Home / Self Care | Payer: Medicare Other | Source: Ambulatory Visit | Attending: Family Medicine | Admitting: Family Medicine

## 2016-10-22 DIAGNOSIS — R928 Other abnormal and inconclusive findings on diagnostic imaging of breast: Secondary | ICD-10-CM | POA: Diagnosis not present

## 2016-10-25 ENCOUNTER — Other Ambulatory Visit: Payer: Self-pay | Admitting: Family Medicine

## 2016-11-06 ENCOUNTER — Telehealth: Payer: Self-pay

## 2016-11-07 ENCOUNTER — Ambulatory Visit (INDEPENDENT_AMBULATORY_CARE_PROVIDER_SITE_OTHER): Payer: Medicare Other | Admitting: Family Medicine

## 2016-11-07 ENCOUNTER — Encounter: Payer: Self-pay | Admitting: Family Medicine

## 2016-11-07 VITALS — BP 110/70 | HR 56 | Temp 97.2°F | Resp 16 | Ht 63.0 in | Wt 214.0 lb

## 2016-11-07 DIAGNOSIS — E785 Hyperlipidemia, unspecified: Secondary | ICD-10-CM | POA: Diagnosis not present

## 2016-11-07 DIAGNOSIS — D126 Benign neoplasm of colon, unspecified: Secondary | ICD-10-CM

## 2016-11-07 DIAGNOSIS — B372 Candidiasis of skin and nail: Secondary | ICD-10-CM | POA: Diagnosis not present

## 2016-11-07 DIAGNOSIS — E039 Hypothyroidism, unspecified: Secondary | ICD-10-CM | POA: Diagnosis not present

## 2016-11-07 DIAGNOSIS — Z87898 Personal history of other specified conditions: Secondary | ICD-10-CM | POA: Diagnosis not present

## 2016-11-07 DIAGNOSIS — I10 Essential (primary) hypertension: Secondary | ICD-10-CM | POA: Diagnosis not present

## 2016-11-07 DIAGNOSIS — R7303 Prediabetes: Secondary | ICD-10-CM | POA: Diagnosis not present

## 2016-11-07 DIAGNOSIS — Z1159 Encounter for screening for other viral diseases: Secondary | ICD-10-CM

## 2016-11-07 DIAGNOSIS — E559 Vitamin D deficiency, unspecified: Secondary | ICD-10-CM | POA: Diagnosis not present

## 2016-11-07 DIAGNOSIS — M519 Unspecified thoracic, thoracolumbar and lumbosacral intervertebral disc disorder: Secondary | ICD-10-CM | POA: Insufficient documentation

## 2016-11-07 MED ORDER — FLUCONAZOLE 100 MG PO TABS: ORAL_TABLET | ORAL | 0 refills | Status: DC

## 2016-11-07 NOTE — Telephone Encounter (Signed)
Called pt about [place], has appt today

## 2016-11-07 NOTE — Patient Instructions (Signed)
Need blood work today Need referral for new colonoscopy, over due Mammogram is good I recommend a flu shot I have prescribed diflucan once a week for 4 weeks Let me know if your rash does not clear up Walk every day that you are able See me in 3 months Call sooner for problems

## 2016-11-07 NOTE — Progress Notes (Signed)
Chief Complaint  Patient presents with  . 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  Used 2 tubes of nystatin.  Will treat with diflucan po States thyroid replacement for years due to "goiter".  No cold intoleracnce or symptoms Had diabetes when more obese BP well controlled Bipolar/anxiety stable under care psychiatry    Patient Active Problem List   Diagnosis Date Noted  . Gastric bypass status for obesity 08/22/2016  . Seizures ([place]) 09/01/2015  . Lumbar foraminal stenosis 07/17/2015  . OA (osteoarthritis) of knee 07/17/2015  . Insomnia due to anxiety and fear 12/20/2013  . Cervical stenosis of spinal canal 11/09/2013  . RLS (restless legs syndrome) 08/09/2013  . Multinodular goiter 06/09/2013  . Alcoholism in remission ([place]) 03/15/2013  . Essential hypertension, benign 05/20/2012  . GAD (generalized anxiety disorder) 05/20/2012  . Hyperlipidemia with target LDL less than 100 05/20/2012  . Bipolar disorder ([place]) 05/20/2012  . Anemia, vitamin B12 deficiency 07/06/2009  . Obesity 06/29/2009  . PERSONAL HX COLONIC POLYPS 06/29/2009    Outpatient Encounter Prescriptions as of 11/07/2016  Medication Sig  . busPIRone (BUSPAR) 10 MG tablet Take 2 tablets by mouth 2 (two) times daily.  . diclofenac sodium (VOLTAREN) 1 % GEL Apply 4 g topically 4 (four) times daily.  Marland Kitchen FLUoxetine (PROZAC) 20 MG capsule Take 3 capsules (60 mg total) by mouth every morning. (Patient taking differently: Take 60 mg by mouth every morning. 2 in am    1 in eve)  . hydrochlorothiazide (MICROZIDE) 12.5 MG capsule Take 1 capsule (12.5 mg total) by mouth daily.  . hydrOXYzine (ATARAX/VISTARIL) 25 MG tablet Take 25 mg by mouth every 4 (four) hours as needed.  Marland Kitchen ketoconazole (NIZORAL) 2 % cream Apply 1 application topically  daily.  Marland Kitchen lamoTRIgine (LAMICTAL) 150 MG tablet Take 1 tablet (150 mg total) by mouth every morning.  Marland Kitchen levothyroxine (SYNTHROID, LEVOTHROID) 50 MCG tablet Take 1 tablet (50 mcg total) by mouth daily.  Marland Kitchen nystatin cream (MYCOSTATIN) APPLY TO AFFECTED AREA TWICE A DAY  . fluconazole (DIFLUCAN) 100 MG tablet One a week for 4 weeks   No facility-administered encounter medications on file as of 11/07/2016.     Allergies  Allergen Reactions  . Ambien [Zolpidem Tartrate] Other (See Comments)    forgetful  . Codeine Itching, Nausea And Vomiting, Rash and Other (See Comments)  . Eszopiclone Rash  . Gabapentin Other (See Comments)    Blisters in mouth   . Lansoprazole Palpitations  . Mirapex [Pramipexole] Other (See Comments)    Mouth dry  . Motrin [Ibuprofen] Nausea And Vomiting  . Prilosec [Omeprazole] Palpitations and Other (See Comments)    Makes heart beat fast.  . Quetiapine Other (See Comments)    fainted  . Trazodone And Nefazodone Other (See Comments)    fainted    Review of Systems  Constitutional: Negative for activity change, appetite change and unexpected weight change.  HENT: Negative for congestion, dental problem, postnasal drip and rhinorrhea.   Eyes: Negative for redness and visual disturbance.  Respiratory: Negative for cough and shortness of breath.   Cardiovascular: Negative for chest pain, palpitations and leg swelling.  Gastrointestinal: Negative for abdominal pain, constipation and diarrhea.  Genitourinary: Negative for difficulty urinating, frequency and vaginal bleeding.  Musculoskeletal: Positive for back pain  and neck stiffness. Negative for arthralgias.       Chronic  Neurological: Negative for dizziness and headaches.  Psychiatric/Behavioral: Negative for dysphoric mood and sleep disturbance. The patient is not nervous/anxious.        Anxiety controlled    BP 110/70 (BP Location: Left Arm, Patient Position: Sitting, Cuff Size: Normal)   Pulse (!) 56    Temp (!) 97.2 F (36.2 C) (Temporal)   Resp 16   Ht 5\' 3"  (1.6 m)   Wt 214 lb (97.1 kg)   SpO2 98%   BMI 37.91 kg/m   Physical Exam BP 110/70 (BP Location: Left Arm, Patient Position: Sitting, Cuff Size: Normal)   Pulse (!) 56   Temp (!) 97.2 F (36.2 C) (Temporal)   Resp 16   Ht 5\' 3"  (1.6 m)   Wt 214 lb (97.1 kg)   SpO2 98%   BMI 37.91 kg/m   General Appearance:    Alert, cooperative, no distress, appears stated age. Obese.  Head:    Normocephalic, without obvious abnormality, atraumatic  Eyes:    PERRL, conjunctiva/corneas clear, EOM's intact, fundi    benign, both eyes  Ears:    Normal TM's and external ear canals, both ears  Nose:   Nares normal, septum midline, mucosa normal, no drainage    or sinus tenderness  Throat:   Lips, mucosa, and tongue normal; teeth well repaitred and gums normal  Neck:   Supple, symmetrical, trachea midline, no adenopathy;    thyroid:  no enlargement/tenderness/nodules; no carotid   Bruit.  Anterior and posterior neck scars from surgery.  Slow ROM  Back:     Symmetric, no curvature, ROM normal, no CVA tenderness.  Well healed lumbar scar  Lungs:     Clear to auscultation bilaterally, respirations unlabored  Chest Wall:    No tenderness or deformity   Heart:    Regular rate and rhythm, S1 and S2 normal, no murmur, rub   or gallop  Breast Exam:    No tenderness, masses, or nipple abnormality.  Candida rash under R breast  Abdomen:     Sof, bowel sounds active all four quadrants,    no masses, no organomegaly.  Palpable liver edge, mildly tender.  Large abdominal pannus  Genitalia:    Normal female without lesion, discharge or tenderness  Extremities:   Extremities normal, atraumatic, no cyanosis or edema  Pulses:   2+ and symmetric all extremities  Skin:   Skin color, texture, turgor normal,  or lesions  Lymph nodes:   Cervical, supraclavicular, and axillary nodes normal  Neurologic:   Normal strength, sensation and reflexes    throughout      ASSESSMENT/PLAN:  1. Vitamin D deficiency - VITAMIN D 25 Hydroxy (Vit-D Deficiency, Fractures)  2. Essential hypertension, benign - CBC - COMPLETE METABOLIC PANEL WITH GFR - Urinalysis, Routine w reflex microscopic  3. Hypothyroidism, unspecified type - TSH  4. Hyperlipidemia, unspecified hyperlipidemia type - Lipid panel  5. Candidal intertrigo diflucan  6. Adenoma of colon Colo 2011, overdue - Ambulatory referral to Gastroenterology  7. Pre-diabetes history - Hemoglobin A1c  8. Encounter for hepatitis C screening test for low risk patient Discussed need, low risk - Hepatitis C antibody  9. History of alcohol use disorder NOT DRINKING since 2015   Patient Instructions  Need blood work today Need referral for new colonoscopy, over due Mammogram is good I recommend a flu shot I have prescribed diflucan once a week for 4 weeks  Let me know if your rash does not clear up Walk every day that you are able See me in 3 months Call sooner for problems   Raylene Everts, MD

## 2016-11-08 ENCOUNTER — Encounter (INDEPENDENT_AMBULATORY_CARE_PROVIDER_SITE_OTHER): Payer: Self-pay | Admitting: *Deleted

## 2016-11-09 LAB — COMPLETE METABOLIC PANEL WITH GFR
AG Ratio: 1.9 (calc) (ref 1.0–2.5)
ALBUMIN MSPROF: 4.1 g/dL (ref 3.6–5.1)
ALKALINE PHOSPHATASE (APISO): 68 U/L (ref 33–130)
ALT: 14 U/L (ref 6–29)
AST: 21 U/L (ref 10–35)
BUN: 14 mg/dL (ref 7–25)
CALCIUM: 8.3 mg/dL — AB (ref 8.6–10.4)
CO2: 31 mmol/L (ref 20–32)
CREATININE: 0.65 mg/dL (ref 0.50–0.99)
Chloride: 103 mmol/L (ref 98–110)
GFR, EST AFRICAN AMERICAN: 110 mL/min/{1.73_m2} (ref >=60)
GFR, EST NON AFRICAN AMERICAN: 95 mL/min/{1.73_m2} (ref >=60)
GLOBULIN: 2.2 g/dL (ref 1.9–3.7)
Glucose, Bld: 99 mg/dL (ref 65–99)
Potassium: 3.9 mmol/L (ref 3.5–5.3)
SODIUM: 141 mmol/L (ref 135–146)
TOTAL PROTEIN: 6.3 g/dL (ref 6.1–8.1)
Total Bilirubin: 0.9 mg/dL (ref 0.2–1.2)

## 2016-11-09 LAB — LIPID PANEL
Cholesterol: 170 mg/dL (ref <200)
HDL: 65 mg/dL (ref >50)
LDL Cholesterol (Calc): 84 mg/dL (calc)
Non-HDL Cholesterol (Calc): 105 mg/dL (calc) (ref <130)
Total CHOL/HDL Ratio: 2.6 (calc) (ref <5.0)
Triglycerides: 113 mg/dL (ref <150)

## 2016-11-09 LAB — CBC
HEMATOCRIT: 43.3 % (ref 35.0–45.0)
HEMOGLOBIN: 14.4 g/dL (ref 11.7–15.5)
MCH: 30.5 pg (ref 27.0–33.0)
MCHC: 33.3 g/dL (ref 32.0–36.0)
MCV: 91.7 fL (ref 80.0–100.0)
MPV: 12 fL (ref 7.5–12.5)
Platelets: 158 10*3/uL (ref 140–400)
RBC: 4.72 10*6/uL (ref 3.80–5.10)
RDW: 14 % (ref 11.0–15.0)
WBC: 4.6 10*3/uL (ref 3.8–10.8)

## 2016-11-09 LAB — HEPATITIS C ANTIBODY
Hepatitis C Ab: NONREACTIVE
SIGNAL TO CUT-OFF: 0.04 (ref <1.00)

## 2016-11-09 LAB — URINALYSIS, ROUTINE W REFLEX MICROSCOPIC
BILIRUBIN URINE: NEGATIVE
GLUCOSE, UA: NEGATIVE
Hgb urine dipstick: NEGATIVE
KETONES UR: NEGATIVE
Leukocytes, UA: NEGATIVE
Nitrite: NEGATIVE
PH: 6 (ref 5.0–8.0)
Protein, ur: NEGATIVE
SPECIFIC GRAVITY, URINE: 1.019 (ref 1.001–1.03)

## 2016-11-09 LAB — HEMOGLOBIN A1C
EAG (MMOL/L): 5 (calc)
Hgb A1c MFr Bld: 4.8 % of total Hgb (ref <5.7)
MEAN PLASMA GLUCOSE: 91 (calc)

## 2016-11-09 LAB — VITAMIN D 25 HYDROXY (VIT D DEFICIENCY, FRACTURES): Vit D, 25-Hydroxy: 28 ng/mL — ABNORMAL LOW (ref 30–100)

## 2016-11-09 LAB — TSH: TSH: 3.66 m[IU]/L (ref 0.40–4.50)

## 2016-11-12 ENCOUNTER — Other Ambulatory Visit (HOSPITAL_COMMUNITY): Payer: Self-pay

## 2016-12-08 ENCOUNTER — Other Ambulatory Visit: Payer: Self-pay | Admitting: Family Medicine

## 2016-12-13 ENCOUNTER — Emergency Department (HOSPITAL_COMMUNITY): Payer: Medicare Other

## 2016-12-13 ENCOUNTER — Other Ambulatory Visit: Payer: Self-pay

## 2016-12-13 ENCOUNTER — Encounter (HOSPITAL_COMMUNITY): Payer: Self-pay | Admitting: Emergency Medicine

## 2016-12-13 ENCOUNTER — Inpatient Hospital Stay (HOSPITAL_COMMUNITY)
Admit: 2016-12-13 | Discharge: 2016-12-13 | Disposition: A | Discharge status: Home / Self Care | Payer: Medicare Other | Attending: Internal Medicine | Admitting: Internal Medicine

## 2016-12-13 ENCOUNTER — Inpatient Hospital Stay (HOSPITAL_COMMUNITY)
Admission: EM | Admit: 2016-12-13 | Discharge: 2016-12-15 | DRG: 897 | Disposition: A | Discharge status: Home / Self Care | Payer: Medicare Other | Attending: Internal Medicine | Admitting: Internal Medicine

## 2016-12-13 DIAGNOSIS — R569 Unspecified convulsions: Secondary | ICD-10-CM | POA: Diagnosis not present

## 2016-12-13 DIAGNOSIS — E876 Hypokalemia: Secondary | ICD-10-CM | POA: Diagnosis present

## 2016-12-13 DIAGNOSIS — F329 Major depressive disorder, single episode, unspecified: Secondary | ICD-10-CM | POA: Diagnosis present

## 2016-12-13 DIAGNOSIS — F10239 Alcohol dependence with withdrawal, unspecified: Principal | ICD-10-CM | POA: Diagnosis present

## 2016-12-13 DIAGNOSIS — F4489 Other dissociative and conversion disorders: Secondary | ICD-10-CM | POA: Diagnosis not present

## 2016-12-13 DIAGNOSIS — F1910 Other psychoactive substance abuse, uncomplicated: Secondary | ICD-10-CM | POA: Diagnosis present

## 2016-12-13 DIAGNOSIS — M4322 Fusion of spine, cervical region: Secondary | ICD-10-CM | POA: Diagnosis not present

## 2016-12-13 DIAGNOSIS — Z8249 Family history of ischemic heart disease and other diseases of the circulatory system: Secondary | ICD-10-CM | POA: Diagnosis not present

## 2016-12-13 DIAGNOSIS — G4089 Other seizures: Secondary | ICD-10-CM | POA: Diagnosis not present

## 2016-12-13 DIAGNOSIS — Z888 Allergy status to other drugs, medicaments and biological substances status: Secondary | ICD-10-CM

## 2016-12-13 DIAGNOSIS — Z885 Allergy status to narcotic agent status: Secondary | ICD-10-CM | POA: Diagnosis not present

## 2016-12-13 DIAGNOSIS — Z79899 Other long term (current) drug therapy: Secondary | ICD-10-CM

## 2016-12-13 DIAGNOSIS — Z87891 Personal history of nicotine dependence: Secondary | ICD-10-CM | POA: Diagnosis not present

## 2016-12-13 DIAGNOSIS — E039 Hypothyroidism, unspecified: Secondary | ICD-10-CM | POA: Diagnosis present

## 2016-12-13 DIAGNOSIS — I10 Essential (primary) hypertension: Secondary | ICD-10-CM | POA: Diagnosis present

## 2016-12-13 DIAGNOSIS — G894 Chronic pain syndrome: Secondary | ICD-10-CM | POA: Diagnosis present

## 2016-12-13 DIAGNOSIS — F13239 Sedative, hypnotic or anxiolytic dependence with withdrawal, unspecified: Secondary | ICD-10-CM | POA: Diagnosis present

## 2016-12-13 LAB — URINALYSIS, ROUTINE W REFLEX MICROSCOPIC
BACTERIA UA: NONE SEEN
BILIRUBIN URINE: NEGATIVE
GLUCOSE, UA: NEGATIVE mg/dL
Hgb urine dipstick: NEGATIVE
KETONES UR: NEGATIVE mg/dL
NITRITE: NEGATIVE
PH: 5 (ref 5.0–8.0)
Protein, ur: 30 mg/dL — AB
SPECIFIC GRAVITY, URINE: 1.018 (ref 1.005–1.030)

## 2016-12-13 LAB — CBC WITH DIFFERENTIAL/PLATELET
Basophils Absolute: 0 10*3/uL (ref 0.0–0.1)
Basophils Relative: 0 %
EOS ABS: 0 10*3/uL (ref 0.0–0.7)
Eosinophils Relative: 0 %
HCT: 46.8 % — ABNORMAL HIGH (ref 36.0–46.0)
Hemoglobin: 15.7 g/dL — ABNORMAL HIGH (ref 12.0–15.0)
LYMPHS ABS: 0.7 10*3/uL (ref 0.7–4.0)
LYMPHS PCT: 7 %
MCH: 31.7 pg (ref 26.0–34.0)
MCHC: 33.5 g/dL (ref 30.0–36.0)
MCV: 94.4 fL (ref 78.0–100.0)
MONOS PCT: 4 %
Monocytes Absolute: 0.4 10*3/uL (ref 0.1–1.0)
Neutro Abs: 8.1 10*3/uL — ABNORMAL HIGH (ref 1.7–7.7)
Neutrophils Relative %: 89 %
PLATELETS: 167 10*3/uL (ref 150–400)
RBC: 4.96 MIL/uL (ref 3.87–5.11)
RDW: 14.1 % (ref 11.5–15.5)
WBC: 9.2 10*3/uL (ref 4.0–10.5)

## 2016-12-13 LAB — LACTIC ACID, PLASMA
LACTIC ACID, VENOUS: 2.4 mmol/L — AB (ref 0.5–1.9)
Lactic Acid, Venous: 1.1 mmol/L (ref 0.5–1.9)

## 2016-12-13 LAB — COMPREHENSIVE METABOLIC PANEL
ALT: 17 U/L (ref 14–54)
ANION GAP: 11 (ref 5–15)
AST: 26 U/L (ref 15–41)
Albumin: 4.3 g/dL (ref 3.5–5.0)
Alkaline Phosphatase: 80 U/L (ref 38–126)
BUN: 15 mg/dL (ref 6–20)
CHLORIDE: 101 mmol/L (ref 101–111)
CO2: 25 mmol/L (ref 22–32)
CREATININE: 0.88 mg/dL (ref 0.44–1.00)
Calcium: 8.7 mg/dL — ABNORMAL LOW (ref 8.9–10.3)
Glucose, Bld: 144 mg/dL — ABNORMAL HIGH (ref 65–99)
Potassium: 3.2 mmol/L — ABNORMAL LOW (ref 3.5–5.1)
SODIUM: 137 mmol/L (ref 135–145)
Total Bilirubin: 0.8 mg/dL (ref 0.3–1.2)
Total Protein: 6.9 g/dL (ref 6.5–8.1)

## 2016-12-13 LAB — I-STAT CG4 LACTIC ACID, ED: Lactic Acid, Venous: 2.8 mmol/L (ref 0.5–1.9)

## 2016-12-13 LAB — MAGNESIUM: Magnesium: 2.1 mg/dL (ref 1.7–2.4)

## 2016-12-13 LAB — RAPID URINE DRUG SCREEN, HOSP PERFORMED
Amphetamines: NOT DETECTED
BARBITURATES: NOT DETECTED
BENZODIAZEPINES: POSITIVE — AB
COCAINE: NOT DETECTED
Opiates: NOT DETECTED
TETRAHYDROCANNABINOL: POSITIVE — AB

## 2016-12-13 LAB — CBG MONITORING, ED: GLUCOSE-CAPILLARY: 168 mg/dL — AB (ref 65–99)

## 2016-12-13 LAB — ETHANOL

## 2016-12-13 MED ORDER — LORAZEPAM 2 MG/ML IJ SOLN: 1.0000 mg | Freq: Four times a day (QID) | INTRAMUSCULAR | Status: DC | PRN

## 2016-12-13 MED ORDER — LORAZEPAM 2 MG/ML IJ SOLN
0.0000 mg | Freq: Four times a day (QID) | INTRAMUSCULAR | Status: DC
Start: 2016-12-13 — End: 2016-12-13
  Administered 2016-12-13: 1 mg via INTRAVENOUS
  Filled 2016-12-13: qty 1

## 2016-12-13 MED ORDER — ONDANSETRON HCL 4 MG PO TABS
4.0000 mg | ORAL_TABLET | Freq: Three times a day (TID) | ORAL | Status: DC | PRN
Start: 2016-12-13 — End: 2016-12-15
  Administered 2016-12-13: 4 mg via ORAL
  Filled 2016-12-13: qty 1

## 2016-12-13 MED ORDER — SODIUM CHLORIDE 0.9 % IV BOLUS (SEPSIS)
1000.0000 mL | Freq: Once | INTRAVENOUS | Status: AC
  Administered 2016-12-13: 1000 mL via INTRAVENOUS

## 2016-12-13 MED ORDER — LORAZEPAM 2 MG/ML IJ SOLN
1.0000 mg | Freq: Once | INTRAMUSCULAR | Status: AC
  Administered 2016-12-13: 1 mg via INTRAVENOUS
  Filled 2016-12-13: qty 1

## 2016-12-13 MED ORDER — LAMOTRIGINE 100 MG PO TABS
150.0000 mg | ORAL_TABLET | Freq: Every morning | ORAL | Status: DC
  Administered 2016-12-13 – 2016-12-15 (×3): 150 mg via ORAL
  Filled 2016-12-13 (×2): qty 2
  Filled 2016-12-13: qty 6

## 2016-12-13 MED ORDER — POTASSIUM CHLORIDE CRYS ER 20 MEQ PO TBCR
40.0000 meq | EXTENDED_RELEASE_TABLET | Freq: Two times a day (BID) | ORAL | Status: AC
  Administered 2016-12-13 (×2): 40 meq via ORAL
  Filled 2016-12-13 (×2): qty 2

## 2016-12-13 MED ORDER — VITAMIN B-1 100 MG PO TABS: 100.0000 mg | ORAL_TABLET | Freq: Every day | ORAL | Status: DC

## 2016-12-13 MED ORDER — ACETAMINOPHEN 325 MG PO TABS: 650.0000 mg | ORAL_TABLET | ORAL | Status: DC | PRN

## 2016-12-13 MED ORDER — LEVOTHYROXINE SODIUM 50 MCG PO TABS
50.0000 ug | ORAL_TABLET | Freq: Every day | ORAL | Status: DC
  Administered 2016-12-14 – 2016-12-15 (×2): 50 ug via ORAL
  Filled 2016-12-13 (×2): qty 1

## 2016-12-13 MED ORDER — FOLIC ACID 1 MG PO TABS: 1.0000 mg | ORAL_TABLET | Freq: Every day | ORAL | Status: DC

## 2016-12-13 MED ORDER — THIAMINE HCL 100 MG/ML IJ SOLN: 100.0000 mg | Freq: Every day | INTRAMUSCULAR | Status: DC

## 2016-12-13 MED ORDER — LORAZEPAM 1 MG PO TABS: 0.0000 mg | ORAL_TABLET | Freq: Two times a day (BID) | ORAL | Status: DC

## 2016-12-13 MED ORDER — LORAZEPAM 2 MG/ML IJ SOLN: 0.0000 mg | Freq: Two times a day (BID) | INTRAMUSCULAR | Status: DC

## 2016-12-13 MED ORDER — VITAMIN B-1 100 MG PO TABS
100.0000 mg | ORAL_TABLET | Freq: Every day | ORAL | Status: DC
  Administered 2016-12-14 – 2016-12-15 (×2): 100 mg via ORAL
  Filled 2016-12-13 (×2): qty 1

## 2016-12-13 MED ORDER — LORAZEPAM 1 MG PO TABS: 0.0000 mg | ORAL_TABLET | Freq: Four times a day (QID) | ORAL | Status: DC

## 2016-12-13 MED ORDER — ADULT MULTIVITAMIN W/MINERALS CH
1.0000 | ORAL_TABLET | Freq: Every day | ORAL | Status: DC
  Administered 2016-12-13 – 2016-12-15 (×3): 1 via ORAL
  Filled 2016-12-13 (×3): qty 1

## 2016-12-13 MED ORDER — LORAZEPAM 1 MG PO TABS: 1.0000 mg | ORAL_TABLET | Freq: Four times a day (QID) | ORAL | Status: DC | PRN

## 2016-12-13 MED ORDER — LORAZEPAM 2 MG/ML IJ SOLN
0.0000 mg | Freq: Four times a day (QID) | INTRAMUSCULAR | Status: AC
  Administered 2016-12-13: 1 mg via INTRAVENOUS
  Filled 2016-12-13 (×2): qty 1

## 2016-12-13 MED ORDER — ENOXAPARIN SODIUM 40 MG/0.4ML ~~LOC~~ SOLN
40.0000 mg | Freq: Every day | SUBCUTANEOUS | Status: DC
  Administered 2016-12-13 – 2016-12-15 (×3): 40 mg via SUBCUTANEOUS
  Filled 2016-12-13 (×3): qty 0.4

## 2016-12-13 MED ORDER — THIAMINE HCL 100 MG/ML IJ SOLN
100.0000 mg | Freq: Every day | INTRAMUSCULAR | Status: DC
  Administered 2016-12-13: 100 mg via INTRAVENOUS
  Filled 2016-12-13: qty 2

## 2016-12-13 MED ORDER — FOLIC ACID 1 MG PO TABS
1.0000 mg | ORAL_TABLET | Freq: Every day | ORAL | Status: DC
  Administered 2016-12-13 – 2016-12-15 (×3): 1 mg via ORAL
  Filled 2016-12-13 (×3): qty 1

## 2016-12-13 MED ORDER — SODIUM CHLORIDE 0.9 % IV SOLN
INTRAVENOUS | Status: DC
  Administered 2016-12-13 – 2016-12-14 (×3): via INTRAVENOUS

## 2016-12-13 MED ORDER — LORAZEPAM 2 MG/ML IJ SOLN: 1.0000 mg | Freq: Once | INTRAMUSCULAR | Status: DC

## 2016-12-13 MED ORDER — BUSPIRONE HCL 5 MG PO TABS
20.0000 mg | ORAL_TABLET | Freq: Two times a day (BID) | ORAL | Status: DC
  Administered 2016-12-13 – 2016-12-15 (×5): 20 mg via ORAL
  Filled 2016-12-13: qty 4
  Filled 2016-12-13 (×5): qty 2
  Filled 2016-12-13: qty 4
  Filled 2016-12-13 (×2): qty 2
  Filled 2016-12-13 (×3): qty 4

## 2016-12-13 MED ORDER — CLONAZEPAM 0.5 MG PO TABS
0.5000 mg | ORAL_TABLET | Freq: Two times a day (BID) | ORAL | Status: DC
  Administered 2016-12-13 – 2016-12-15 (×5): 0.5 mg via ORAL
  Filled 2016-12-13 (×5): qty 1

## 2016-12-13 NOTE — Progress Notes (Signed)
EEG Completed; Results Pending  

## 2016-12-13 NOTE — ED Notes (Signed)
Neurology no coverage at [facility]. Dr. Daleen Bo paged and confirmed that we can wait till tomorrow 12/14/16 for Neuro to see patient.

## 2016-12-13 NOTE — ED Triage Notes (Signed)
Pt was at work when she had a seizure. Unknown duration. Pt currently abusing friends' RX of xanax as well as admits to abusing alcohol heavily for the last year. Pt abused xanax and ran out of friend's prescription and has been going through withdrawal symptoms.  Tremors noted. VSS

## 2016-12-13 NOTE — ED Notes (Signed)
Patient transported to CT 

## 2016-12-13 NOTE — ED Notes (Signed)
Lactic acid of 2.8 value given to [facility] MD.

## 2016-12-13 NOTE — Care Management Note (Signed)
Case Management Note  Patient Details  Name: Tamara Mccullough MRN: [id number] Date of Birth: 06-23-1955  Subjective/Objective:     Patient, awake and alert.   States lives at home alone, and independent.  States due to recent seizure has a friend who is going to stay with her and drive her on discharge.   Has a PCP.              Action/Plan:   Expected Discharge Date:                  Expected Discharge Plan:     In-House Referral:     Discharge planning Services     Post Acute Care Choice:    Choice offered to:     DME Arranged:    DME Agency:     HH Arranged:    HH Agency:     Status of Service:     If discussed at [employer] of Stay Meetings, dates discussed:    Additional Comments:  Vergie Living, RN 12/13/2016, 1:48 PM

## 2016-12-13 NOTE — Progress Notes (Signed)
CRITICAL VALUE ALERT  Critical Value: Lactic Acid 2.4  Date & Time Notied:12/13/2016 1824  Provider Notified:Dr. Daleen Bo  Orders Received/Actions taken: No new orders.

## 2016-12-13 NOTE — ED Provider Notes (Signed)
[facility] EMERGENCY DEPARTMENT Provider Note   CSN: [id number] Arrival date & time: 12/13/16  1046     History   Chief Complaint Chief Complaint  Patient presents with  . Seizures    HPI Tamara Mccullough is a 61 y.o. female.  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  She is very loose on her timeframe.  The patient reports that she had been sober for 21 years up until her Ambien was stopped by her family doctor, at that time she started drinking again, last had a liquor.  States that she has been increasingly jittery tremulous and feeling off balance when she walks.  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  When she came around there were people standing over her, she does not recall the exact events but does remember having some pain in the left side of her tongue and some pain in her left lower lip.  At this time she denies headache, neck pain, numbness, weakness, abdominal pain, chest pain, palpitations, shortness of breath, cough, fever, chills.  She does recall having a prior seizure when she came off of benzodiazepines in the past though she cannot recall the details.  Paramedics report that the patient's blood sugar was normal, there is no seizures in route.  Past Medical History:  Diagnosis Date  . Allergy   . Anemia   . Arthritis    back   . Asthma    slight  . Chronic back pain   . Depression   . Depression   . Edema   . Hyperlipidemia   . Hypertension   . Insomnia   . Morbid obesity ([place])   . Neuromuscular disorder ([place])   . Overdose of sleeping tabs 03/10/2013  . Panic attacks   . PONV (postoperative nausea and vomiting)   . Restless leg syndrome   . Seizures ([place])    hx of seizure 05/2013   . Sleep apnea      has not used CPAP in 3 years   . Thyroid disease     Patient Active Problem List   Diagnosis Date Noted  . Lumbar disc disorder 11/07/2016  . Gastric bypass status for obesity 08/22/2016  . Seizures ([place]) 09/01/2015  . Lumbar foraminal stenosis 07/17/2015  . OA (osteoarthritis) of knee 07/17/2015  . Insomnia due to anxiety and fear 12/20/2013  . Cervical stenosis of spinal canal 11/09/2013  . RLS (restless legs syndrome) 08/09/2013  . Multinodular goiter 06/09/2013  . Alcoholism in remission ([place]) 03/15/2013  . Essential hypertension, benign 05/20/2012  . GAD (generalized anxiety disorder) 05/20/2012  . Hyperlipidemia with target LDL less than 100 05/20/2012  . Bipolar disorder ([place]) 05/20/2012  . Anemia, vitamin B12 deficiency 07/06/2009  . Obesity 06/29/2009  . PERSONAL HX COLONIC POLYPS 06/29/2009    Past Surgical History:  Procedure Laterality Date  . BACK SURGERY    . CTR    . gastric by pass  6/11  . MINOR HEMORRHOIDECTOMY    . SPINE SURGERY    . TUBAL LIGATION      OB History    No data available       Home Medications    Prior to Admission medications  Medication Sig Start Date End Date Taking? Authorizing Provider  busPIRone (BUSPAR) 10 MG tablet Take 2 tablets by mouth 2 (two) times daily. 03/06/16  Yes [provider]  diclofenac sodium (VOLTAREN) 1 % GEL Apply 4 g topically 4 (four) times daily. 08/22/16  Yes Raylene Everts, MD  FLUoxetine (PROZAC) 20 MG capsule Take 3 capsules (60 mg total) by mouth every morning. Patient taking differently: Take 60 mg by mouth every morning. 2 in am    1 in eve 03/28/15  Yes Kerrie Buffalo, NP  hydrochlorothiazide (MICROZIDE) 12.5 MG capsule Take 1 capsule (12.5 mg total) by mouth daily. 08/22/16  Yes Raylene Everts, MD  ketoconazole (NIZORAL) 2 % cream Apply 1 application topically daily. 08/22/16  Yes Raylene Everts, MD  lamoTRIgine (LAMICTAL) 150 MG tablet Take 1 tablet (150 mg total) by mouth  every morning. 03/28/15  Yes Kerrie Buffalo, NP  levothyroxine (SYNTHROID, LEVOTHROID) 50 MCG tablet Take 1 tablet (50 mcg total) by mouth daily. 04/24/16  Yes Timmothy Euler, MD  nystatin cream (MYCOSTATIN) APPLY TO AFFECTED AREA TWICE A DAY 10/25/16  Yes Raylene Everts, MD  fluconazole (DIFLUCAN) 100 MG tablet One a week for 4 weeks Patient not taking: Reported on 12/13/2016 11/07/16   Raylene Everts, MD  hydrOXYzine (ATARAX/VISTARIL) 25 MG tablet Take 25 mg by mouth every 4 (four) hours as needed.    [provider]    Family History Family History  Problem Relation Age of Onset  . Congestive Heart Failure Mother   . Diabetes Mother        died at 68  . Cancer Mother        cervical  . Arthritis Mother   . Asthma Mother   . Depression Mother   . Heart disease Mother   . Hypertension Mother   . Congestive Heart Failure Father   . Emphysema Father   . Alcohol abuse Father   . Diabetes Father   . Arthritis Father   . COPD Father   . Depression Father   . Heart disease Father        heart attack died at 28  . Hypertension Father   . Stroke Father   . Alcohol abuse Sister   . Alcohol abuse Brother   . Alcohol abuse Paternal Grandfather   . Alcohol abuse Brother   . Alcohol abuse Brother     Social History Social History   Tobacco Use  . Smoking status: Former Smoker    Last attempt to quit: 02/05/1996    Years since quitting: 20.8  . Smokeless tobacco: Never Used  Substance Use Topics  . Alcohol use: Yes    Comment: heavy liquor for the last year   . Drug use: Yes    Types: Benzodiazepines    Comment: currently abusing Xanax     Allergies   Ambien [zolpidem tartrate]; Codeine; Eszopiclone; Gabapentin; Lansoprazole; Mirapex [pramipexole]; Motrin [ibuprofen]; Prilosec [omeprazole]; Quetiapine; and Trazodone and nefazodone   Review of Systems Review of Systems  All other systems reviewed and are negative.    Physical Exam Updated Vital  Signs BP (!) 122/59   Pulse 66   Temp 98.1 F (36.7 C) (Oral)   Resp 18   SpO2 94%   Physical Exam  Constitutional: She appears well-developed and well-nourished. No distress.  HENT:  Head: Normocephalic.  Mouth/Throat: Oropharynx is clear and moist. No oropharyngeal exudate.  Left side of the tongue with a bite mark, left  lower lip with a small split, no open laceration to repair  Eyes: Conjunctivae and EOM are normal. Pupils are equal, round, and reactive to light. Right eye exhibits no discharge. Left eye exhibits no discharge. No scleral icterus.  Neck: Normal range of motion. Neck supple. No JVD present. No thyromegaly present.  Cardiovascular: Regular rhythm, normal heart sounds and intact distal pulses. Exam reveals no gallop and no friction rub.  No murmur heard. Mild tachycardia, normal pulses  Pulmonary/Chest: Effort normal and breath sounds normal. No respiratory distress. She has no wheezes. She has no rales.  Abdominal: Soft. Bowel sounds are normal. She exhibits no distension and no mass. There is no tenderness.  Musculoskeletal: Normal range of motion. She exhibits no edema or tenderness.  The patient is able to range all 4 extremities at the major joints without any pain or difficulty.  The compartments are soft diffusely.  Cervical spine is nontender  Lymphadenopathy:    She has no cervical adenopathy.  Neurological: She is alert. Coordination normal.  The patient is awake alert and has a diffuse mild tremor.  She has normal grips and is able to straight leg raise bilaterally.  Cranial nerves III through XII are normal, memory is intact except for the events surrounding the witnessed seizures.  Skin: Skin is warm and dry. No rash noted. No erythema.  Psychiatric: She has a normal mood and affect. Her behavior is normal.  Nursing note and vitals reviewed.    ED Treatments / Results  Labs (all labs ordered are listed, but only abnormal results are displayed) Labs  Reviewed  CBC WITH DIFFERENTIAL/PLATELET - Abnormal; Notable for the following components:      Result Value   Hemoglobin 15.7 (*)    HCT 46.8 (*)    Neutro Abs 8.1 (*)    All other components within normal limits  COMPREHENSIVE METABOLIC PANEL - Abnormal; Notable for the following components:   Potassium 3.2 (*)    Glucose, Bld 144 (*)    Calcium 8.7 (*)    All other components within normal limits  URINALYSIS, ROUTINE W REFLEX MICROSCOPIC - Abnormal; Notable for the following components:   Protein, ur 30 (*)    Leukocytes, UA TRACE (*)    Squamous Epithelial / LPF 6-30 (*)    All other components within normal limits  RAPID URINE DRUG SCREEN, HOSP PERFORMED - Abnormal; Notable for the following components:   Benzodiazepines POSITIVE (*)    Tetrahydrocannabinol POSITIVE (*)    All other components within normal limits  CBG MONITORING, ED - Abnormal; Notable for the following components:   Glucose-Capillary 168 (*)    All other components within normal limits  I-STAT CG4 LACTIC ACID, ED - Abnormal; Notable for the following components:   Lactic Acid, Venous 2.80 (*)    All other components within normal limits  ETHANOL    EKG  EKG Interpretation  Date/Time:  Friday December 13 2016 10:50:56 EST Ventricular Rate:  69 PR Interval:    QRS Duration: 119 QT Interval:  531 QTC Calculation: 569 R Axis:   -27 Text Interpretation:  Sinus rhythm Probable left atrial enlargement Incomplete right bundle branch block Left ventricular hypertrophy Anterior Q waves, possibly due to LVH since last tracing no significant change Confirmed by Noemi Chapel [phone]) on 12/13/2016 11:10:33 AM       Radiology Ct Head Wo Contrast  Result Date: 12/13/2016 CLINICAL DATA:  Seizure today. EXAM: CT HEAD WITHOUT CONTRAST CT CERVICAL SPINE WITHOUT CONTRAST  TECHNIQUE: Multidetector CT imaging of the head and cervical spine was performed following the standard protocol without intravenous contrast.  Multiplanar CT image reconstructions of the cervical spine were also generated. COMPARISON:  MRI cervical spine 10/23/2013. FINDINGS: CT HEAD FINDINGS Brain: Appears normal without hemorrhage, infarct, mass lesion, mass effect, midline shift or abnormal extra-axial fluid collection. No hydrocephalus or pneumocephalus. Vascular: No hyperdense vessel or unexpected calcification. Skull: Intact. Sinuses/Orbits: Negative Other: None. CT CERVICAL SPINE FINDINGS Alignment: Straightening of lordosis is noted. Skull base and vertebrae: No fracture or focal lesion. The patient is status post C4-7 fusion and right laminotomy at C5-6 and C6-7. Soft tissues and spinal canal: No prevertebral fluid or swelling. No visible canal hematoma. Disc levels: Residual endplate spurring is seen at C6-7 eccentric to the right but the central canal appears open. Upper chest: Negative. Other: None. IMPRESSION: No acute abnormality head or cervical spine. Status post C4-7 fusion. Electronically Signed   By: Inge Rise M.D.   On: 12/13/2016 12:20   Ct Cervical Spine Wo Contrast  Result Date: 12/13/2016 CLINICAL DATA:  Seizure today. EXAM: CT HEAD WITHOUT CONTRAST CT CERVICAL SPINE WITHOUT CONTRAST TECHNIQUE: Multidetector CT imaging of the head and cervical spine was performed following the standard protocol without intravenous contrast. Multiplanar CT image reconstructions of the cervical spine were also generated. COMPARISON:  MRI cervical spine 10/23/2013. FINDINGS: CT HEAD FINDINGS Brain: Appears normal without hemorrhage, infarct, mass lesion, mass effect, midline shift or abnormal extra-axial fluid collection. No hydrocephalus or pneumocephalus. Vascular: No hyperdense vessel or unexpected calcification. Skull: Intact. Sinuses/Orbits: Negative Other: None. CT CERVICAL SPINE FINDINGS Alignment: Straightening of lordosis is noted. Skull base and vertebrae: No fracture or focal lesion. The patient is status post C4-7 fusion and  right laminotomy at C5-6 and C6-7. Soft tissues and spinal canal: No prevertebral fluid or swelling. No visible canal hematoma. Disc levels: Residual endplate spurring is seen at C6-7 eccentric to the right but the central canal appears open. Upper chest: Negative. Other: None. IMPRESSION: No acute abnormality head or cervical spine. Status post C4-7 fusion. Electronically Signed   By: Inge Rise M.D.   On: 12/13/2016 12:20    Procedures Procedures (including critical care time)  Medications Ordered in ED Medications  LORazepam (ATIVAN) injection 0-4 mg (1 mg Intravenous Given 12/13/16 1200)    Or  LORazepam (ATIVAN) tablet 0-4 mg ( Oral See Alternative 12/13/16 1200)  LORazepam (ATIVAN) injection 0-4 mg (not administered)    Or  LORazepam (ATIVAN) tablet 0-4 mg (not administered)  thiamine (VITAMIN B-1) tablet 100 mg ( Oral See Alternative 12/13/16 1123)    Or  thiamine (B-1) injection 100 mg (100 mg Intravenous Given 12/13/16 1123)  ondansetron (ZOFRAN) tablet 4 mg (4 mg Oral Given 12/13/16 1132)  acetaminophen (TYLENOL) tablet 650 mg (not administered)  LORazepam (ATIVAN) injection 1 mg (0 mg Intravenous Hold 12/13/16 1237)  sodium chloride 0.9 % bolus 1,000 mL (1,000 mLs Intravenous New Bag/Given 12/13/16 1123)  LORazepam (ATIVAN) injection 1 mg (1 mg Intravenous Given 12/13/16 1123)     Initial Impression / Assessment and Plan / ED Course  I have reviewed the triage vital signs and the nursing notes.  Pertinent labs & imaging results that were available during my care of the patient were reviewed by me and considered in my medical decision making (see chart for details).     The patient has had what appears to be benzodiazepine or alcohol withdrawal seizures.  She has had this in the  past.  She will undergo CT scan imaging of the head and cervical spine to rule out trauma, this does not appear to be a new onset epileptic seizure however she will need neuro imaging.  Labs ordered,  Ativan ordered, the patient will likely need to be admitted for observation admission.  The patient was given a dose of Ativan, continued to be tremulous, labs reviewed showing no acute findings of than a positive drug screen.  A second dose of Ativan was given, CT scan of the brain negative, patient needs to be admitted to the hospital, discussed with the hospitalist at 12:40 PM, they will be kind enough to admit.  Final Clinical Impressions(s) / ED Diagnoses   Final diagnoses:  Alcohol withdrawal seizure with complication [facility])  Substance abuse [facility])    ED Discharge Orders    None       Noemi Chapel, MD 12/13/16 1244

## 2016-12-13 NOTE — Procedures (Signed)
  [place] A. Merlene Laughter, MD     [email or web address]           HISTORY: This is a 61 year old female who presents with seizures.  However, the history of alcohol abuse and benzodiazepine overuse.  MEDICATIONS: Scheduled Meds: Continuous Infusions: PRN Meds:.  Prior to Admission medications   Medication Sig Start Date End Date Taking? Authorizing Provider  busPIRone (BUSPAR) 10 MG tablet Take 2 tablets by mouth 2 (two) times daily. 03/06/16   [provider]  diclofenac sodium (VOLTAREN) 1 % GEL Apply 4 g topically 4 (four) times daily. 08/22/16   Raylene Everts, MD  fluconazole (DIFLUCAN) 100 MG tablet One a week for 4 weeks Patient not taking: Reported on 12/13/2016 11/07/16   Raylene Everts, MD  FLUoxetine (PROZAC) 20 MG capsule Take 3 capsules (60 mg total) by mouth every morning. Patient taking differently: Take 60 mg by mouth every morning. 2 in am    1 in eve 03/28/15   Kerrie Buffalo, NP  hydrochlorothiazide (MICROZIDE) 12.5 MG capsule Take 1 capsule (12.5 mg total) by mouth daily. 08/22/16   Raylene Everts, MD  hydrOXYzine (ATARAX/VISTARIL) 25 MG tablet Take 25 mg by mouth every 4 (four) hours as needed.    [provider]  ketoconazole (NIZORAL) 2 % cream Apply 1 application topically daily. 08/22/16   Raylene Everts, MD  lamoTRIgine (LAMICTAL) 150 MG tablet Take 1 tablet (150 mg total) by mouth every morning. 03/28/15   Kerrie Buffalo, NP  levothyroxine (SYNTHROID, LEVOTHROID) 50 MCG tablet Take 1 tablet (50 mcg total) by mouth daily. 04/24/16   Timmothy Euler, MD  nystatin cream (MYCOSTATIN) APPLY TO AFFECTED AREA TWICE A DAY 10/25/16   Raylene Everts, MD      ANALYSIS: A 16 channel recording using standard 10 20 measurements is conducted for 22 minutes.   There is a well-formed posterior dominant rhythm of 10 hertz which attenuates with eye opening.  Awake and drowsy activities are observed.  Photic stimulation is  carried out without abnormal changes in the background activity.  There is no focal or lateralized slowing.  There is no epileptiform activity is observed.   IMPRESSION: This is a normal recording of awake and drowsy states.      Harlan Vinal A. Merlene Laughter, M.D.  Diplomate, Tax adviser of Psychiatry and Neurology ( Neurology).

## 2016-12-13 NOTE — H&P (Signed)
[facility] History and Physical  Tamara Mccullough FWY:[id number] DOB: Apr 23, 1955 DOA: 12/13/2016  Referring physician:  PCP: Raylene Everts, MD  Specialists:   Chief Complaint: seizure   HPI: Tamara Mccullough is a 61 y.o. female with PMH of chronic pain syndrome, multiple spinal surgeries, hypertension, hyperthyroidism, alcoholism, benzodiazepine abuse, presented after witnesses seizure. Patient reports that she had been sober for 21 years but started drinking again for the last 1.5 years. Last drink was on Wednesday. She states that she was taking xanax almost daily for the last several years. She says that her doctor stopped her benzos but she was still buying on the streets. She reports that she has been jittery, tremulous and feeling off balance for few days. 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 She was post ictal confused, had tongue biting and urinary incontinent. She reports similar 2 more episodes of seizures this year (winter, summer) when she came off benzodiazepines.  She denies any focal weakness or paresthesias, no acute headaches, no trauma. No acute chest pains, no shortness of breath, no abdominal pains, no nausea, vomiting or diarrhea.  -ED: ct head: no acute findings. Lactic acid2.8. hospitalist is called for observation for withdrawal seizures   Review of Systems: The patient denies anorexia, fever, weight loss,, vision loss, decreased hearing, hoarseness, chest pain, syncope, dyspnea on exertion, peripheral edema, balance deficits, hemoptysis, abdominal pain, melena, hematochezia, severe indigestion/heartburn, hematuria, incontinence, genital sores, muscle weakness, suspicious skin lesions, transient blindness, difficulty walking, depression, unusual weight change, abnormal bleeding, enlarged lymph nodes, angioedema, and breast masses.    Past Medical History:  Diagnosis Date  . Allergy   . Anemia   . Arthritis    back   . Asthma     slight  . Chronic back pain   . Depression   . Depression   . Edema   . Hyperlipidemia   . Hypertension   . Insomnia   . Morbid obesity ([place])   . Neuromuscular disorder ([place])   . Overdose of sleeping tabs 03/10/2013  . Panic attacks   . PONV (postoperative nausea and vomiting)   . Restless leg syndrome   . Seizures ([place])    hx of seizure 05/2013   . Sleep apnea    has not used CPAP in 3 years   . Thyroid disease    Past Surgical History:  Procedure Laterality Date  . BACK SURGERY    . CTR    . gastric by pass  6/11  . MINOR HEMORRHOIDECTOMY    . SPINE SURGERY    . TUBAL LIGATION     Social History:  reports that she quit smoking about 20 years ago. she has never used smokeless tobacco. She reports that she drinks alcohol. She reports that she uses drugs. Drug: Benzodiazepines. Home;  where does patient live--home, ALF, SNF? and with whom if at home? Ys;  Can patient participate in ADLs?  Allergies  Allergen Reactions  . Ambien [Zolpidem Tartrate] Other (See Comments)    forgetful  . Codeine Itching, Nausea And Vomiting, Rash and Other (See Comments)  . Eszopiclone Rash  . Gabapentin Other (See Comments)    Blisters in mouth   . Lansoprazole Palpitations  . Mirapex [Pramipexole] Other (See Comments)    Mouth dry  . Motrin [Ibuprofen] Nausea And Vomiting  . Prilosec [Omeprazole] Palpitations and Other (See Comments)    Makes heart beat fast.  . Quetiapine Other (See Comments)    fainted  .  Trazodone And Nefazodone Other (See Comments)    fainted    Family History  Problem Relation Age of Onset  . Congestive Heart Failure Mother   . Diabetes Mother        died at 17  . Cancer Mother        cervical  . Arthritis Mother   . Asthma Mother   . Depression Mother   . Heart disease Mother   . Hypertension Mother   . Congestive Heart Failure Father   . Emphysema Father   . Alcohol abuse Father   . Diabetes Father   . Arthritis Father   . COPD Father   .  Depression Father   . Heart disease Father        heart attack died at 66  . Hypertension Father   . Stroke Father   . Alcohol abuse Sister   . Alcohol abuse Brother   . Alcohol abuse Paternal Grandfather   . Alcohol abuse Brother   . Alcohol abuse Brother     (be sure to complete)  Prior to Admission medications   Medication Sig Start Date End Date Taking? Authorizing Provider  busPIRone (BUSPAR) 10 MG tablet Take 2 tablets by mouth 2 (two) times daily. 03/06/16  Yes [provider]  diclofenac sodium (VOLTAREN) 1 % GEL Apply 4 g topically 4 (four) times daily. 08/22/16  Yes Raylene Everts, MD  FLUoxetine (PROZAC) 20 MG capsule Take 3 capsules (60 mg total) by mouth every morning. Patient taking differently: Take 60 mg by mouth every morning. 2 in am    1 in eve 03/28/15  Yes Kerrie Buffalo, NP  hydrochlorothiazide (MICROZIDE) 12.5 MG capsule Take 1 capsule (12.5 mg total) by mouth daily. 08/22/16  Yes Raylene Everts, MD  ketoconazole (NIZORAL) 2 % cream Apply 1 application topically daily. 08/22/16  Yes Raylene Everts, MD  lamoTRIgine (LAMICTAL) 150 MG tablet Take 1 tablet (150 mg total) by mouth every morning. 03/28/15  Yes Kerrie Buffalo, NP  levothyroxine (SYNTHROID, LEVOTHROID) 50 MCG tablet Take 1 tablet (50 mcg total) by mouth daily. 04/24/16  Yes Timmothy Euler, MD  nystatin cream (MYCOSTATIN) APPLY TO AFFECTED AREA TWICE A DAY 10/25/16  Yes Raylene Everts, MD  fluconazole (DIFLUCAN) 100 MG tablet One a week for 4 weeks Patient not taking: Reported on 12/13/2016 11/07/16   Raylene Everts, MD  hydrOXYzine (ATARAX/VISTARIL) 25 MG tablet Take 25 mg by mouth every 4 (four) hours as needed.    [provider]   Physical Exam: Vitals:   12/13/16 1118 12/13/16 1130  BP: (!) 105/57 (!) 122/59  Pulse: 63 66  Resp: 18 18  Temp: 98.1 F (36.7 C)   SpO2: 94% 94%     General:  Alert. No distress   Eyes: eom-I, perrla   ENT: tongue laceration  left lateral-mild  Neck: supple, no JVD  Cardiovascular: s1,s2 rrr  Respiratory: CTA BL  Abdomen: soft, nt, nd   Skin: no rash   Musculoskeletal: no leg edema   Psychiatric: no hallucinations,   Neurologic: cn 2-12 intact. Motor 5/5 bl  Labs on Admission:  Basic Metabolic Panel: Recent Labs  Lab 12/13/16 1101  NA 137  K 3.2*  CL 101  CO2 25  GLUCOSE 144*  BUN 15  CREATININE 0.88  CALCIUM 8.7*   Liver Function Tests: Recent Labs  Lab 12/13/16 1101  AST 26  ALT 17  ALKPHOS 80  BILITOT 0.8  PROT 6.9  ALBUMIN 4.3   No results for input(s): LIPASE, AMYLASE in the last 168 hours. No results for input(s): AMMONIA in the last 168 hours. CBC: Recent Labs  Lab 12/13/16 1101  WBC 9.2  NEUTROABS 8.1*  HGB 15.7*  HCT 46.8*  MCV 94.4  PLT 167   Cardiac Enzymes: No results for input(s): CKTOTAL, CKMB, CKMBINDEX, TROPONINI in the last 168 hours.  BNP (last 3 results) No results for input(s): BNP in the last 8760 hours.  ProBNP (last 3 results) No results for input(s): PROBNP in the last 8760 hours.  CBG: Recent Labs  Lab 12/13/16 1049  GLUCAP 168*    Radiological Exams on Admission: Ct Head Wo Contrast  Result Date: 12/13/2016 CLINICAL DATA:  Seizure today. EXAM: CT HEAD WITHOUT CONTRAST CT CERVICAL SPINE WITHOUT CONTRAST TECHNIQUE: Multidetector CT imaging of the head and cervical spine was performed following the standard protocol without intravenous contrast. Multiplanar CT image reconstructions of the cervical spine were also generated. COMPARISON:  MRI cervical spine 10/23/2013. FINDINGS: CT HEAD FINDINGS Brain: Appears normal without hemorrhage, infarct, mass lesion, mass effect, midline shift or abnormal extra-axial fluid collection. No hydrocephalus or pneumocephalus. Vascular: No hyperdense vessel or unexpected calcification. Skull: Intact. Sinuses/Orbits: Negative Other: None. CT CERVICAL SPINE FINDINGS Alignment: Straightening of lordosis is  noted. Skull base and vertebrae: No fracture or focal lesion. The patient is status post C4-7 fusion and right laminotomy at C5-6 and C6-7. Soft tissues and spinal canal: No prevertebral fluid or swelling. No visible canal hematoma. Disc levels: Residual endplate spurring is seen at C6-7 eccentric to the right but the central canal appears open. Upper chest: Negative. Other: None. IMPRESSION: No acute abnormality head or cervical spine. Status post C4-7 fusion. Electronically Signed   By: Inge Rise M.D.   On: 12/13/2016 12:20   Ct Cervical Spine Wo Contrast  Result Date: 12/13/2016 CLINICAL DATA:  Seizure today. EXAM: CT HEAD WITHOUT CONTRAST CT CERVICAL SPINE WITHOUT CONTRAST TECHNIQUE: Multidetector CT imaging of the head and cervical spine was performed following the standard protocol without intravenous contrast. Multiplanar CT image reconstructions of the cervical spine were also generated. COMPARISON:  MRI cervical spine 10/23/2013. FINDINGS: CT HEAD FINDINGS Brain: Appears normal without hemorrhage, infarct, mass lesion, mass effect, midline shift or abnormal extra-axial fluid collection. No hydrocephalus or pneumocephalus. Vascular: No hyperdense vessel or unexpected calcification. Skull: Intact. Sinuses/Orbits: Negative Other: None. CT CERVICAL SPINE FINDINGS Alignment: Straightening of lordosis is noted. Skull base and vertebrae: No fracture or focal lesion. The patient is status post C4-7 fusion and right laminotomy at C5-6 and C6-7. Soft tissues and spinal canal: No prevertebral fluid or swelling. No visible canal hematoma. Disc levels: Residual endplate spurring is seen at C6-7 eccentric to the right but the central canal appears open. Upper chest: Negative. Other: None. IMPRESSION: No acute abnormality head or cervical spine. Status post C4-7 fusion. Electronically Signed   By: Inge Rise M.D.   On: 12/13/2016 12:20    EKG: Independently reviewed.   Assessment/Plan Active  Problems:   Seizure [facility])  61 y.o. female with PMH of chronic pain syndrome, multiple spinal surgeries, hypertension, hyperthyroidism, alcoholism, benzodiazepine abuse, presented after witnesses seizure. Admitted alcohol/benzo withdrawal seizure  -ED: ct head: no acute findings. Lactic acid2.8. hospitalist is called for observation for withdrawal seizures   Seizure. Likely due to etoh/benzo withdrawals. Similar symptoms in the past. Suspect benzo dependence. head ct: no acute findings. Neuro exam is non focal  -will monitor on ciwa protocol,  start  clonazepam with slow taper. Iv fluids. Thiamine. Obtain eeg, neurology eval -questionable need antiepileptic due to recurrent seizures (already on lamictal but not for seizure). Defer further imaging/mri brain is needed to neurology. Hold prozac due to seizure. Recommend not to drive at discharge   Hypokalemia. Replace. Hold hctz. Check Mg Mild elevated lactate, no significant acidosis, no acute abd pains. Will cont iv hydration. F/u labs    Neurology.  if consultant consulted, please document name and whether formally or informally consulted  Code Status: full (must indicate code status--if unknown or must be presumed, indicate so) Family Communication: d/w patient. ED (indicate person spoken with, if applicable, with phone number if by telephone) Disposition Plan: home 24-48 hrs  (indicate anticipated LOS)  Time spent: >45 minutes   Kinnie Feil [facility] Pager [phone]  If 7PM-7AM, please contact night-coverage [email or web address] Password [email or web address] 12/13/2016, 1:03 PM

## 2016-12-14 DIAGNOSIS — F1910 Other psychoactive substance abuse, uncomplicated: Secondary | ICD-10-CM

## 2016-12-14 DIAGNOSIS — R569 Unspecified convulsions: Secondary | ICD-10-CM

## 2016-12-14 DIAGNOSIS — F10239 Alcohol dependence with withdrawal, unspecified: Principal | ICD-10-CM

## 2016-12-14 LAB — BASIC METABOLIC PANEL
ANION GAP: 5 (ref 5–15)
BUN: 12 mg/dL (ref 6–20)
CALCIUM: 8 mg/dL — AB (ref 8.9–10.3)
CHLORIDE: 108 mmol/L (ref 101–111)
CO2: 26 mmol/L (ref 22–32)
CREATININE: 0.84 mg/dL (ref 0.44–1.00)
GFR calc Af Amer: >60 mL/min (ref >60)
Glucose, Bld: 127 mg/dL — ABNORMAL HIGH (ref 65–99)
Potassium: 4.2 mmol/L (ref 3.5–5.1)
Sodium: 139 mmol/L (ref 135–145)

## 2016-12-14 LAB — HIV ANTIBODY (ROUTINE TESTING W REFLEX): HIV SCREEN 4TH GENERATION: NONREACTIVE

## 2016-12-14 MED ORDER — SODIUM CHLORIDE 0.9 % IV SOLN
INTRAVENOUS | Status: AC
  Administered 2016-12-14 (×2): via INTRAVENOUS

## 2016-12-14 NOTE — Progress Notes (Signed)
PROGRESS NOTE    Tamara Mccullough  KGU:[id number] DOB: 05/04/1955 DOA: 12/13/2016 PCP: Raylene Everts, MD  Brief Narrative:Tamara Mccullough is a 61 y.o. female with PMH of chronic pain syndrome, multiple spinal surgeries, hypertension, hyperthyroidism, alcoholism, benzodiazepine abuse, presented after witnesses seizure. Patient reports that she had been sober for 21 years but started drinking again for the last 1.5 years. Last drink was on Wednesday. She states that she was taking xanax almost daily for the last several years. She says that her doctor stopped her benzos but she was still buying on the streets. She reports that she has been jittery, tremulous and feeling off balance for few days. 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 She was post ictal confused, had tongue biting and urinary incontinent. She reports similar 2 more episodes of seizures this year (winter, summer) when she came off benzodiazepines.  Assessment & Plan:     Seizure ([place]) -Secondary to alcohol and or benzodiazepine withdrawal Improved, stable now -Continue low-dose Klonopin twice a day -Ativan per CIWA protocol, -CT head without acute findings, EEG without overt epileptiform activities    Alcohol abuse -Counseled, was sober for 21 years and then started drinking again last year -As above -She plans to go back to [employer] and is aware of all the resources   Hypokalemia -Replaced, HCTZ held    Depression/Anxiety - FU with Psych   Hypothyroidism -continue synthroid  DVT prophylaxis: lovenox Code Status: Full Code Family Communication: None at bedsde Disposition Plan: Home tomorrow if stable   Procedures: EEG  Antimicrobials:    Subjective: -feels ok, no seizures overnight, notes that her Upper lip was bit during Friday seizures   Objective: Vitals:   12/13/16 1500 12/13/16 2042 12/14/16 0016 12/14/16 0242  BP:  (!) 124/57 (!) 122/57 134/71  Pulse:  61 (!)  55 67  Resp:  16  16  Temp:  98.4 F (36.9 C)  98.3 F (36.8 C)  TempSrc: Oral Oral  Oral  SpO2:  94%  96%  Weight: 89.8 kg (198 lb)     Height: 5\' 3"  (1.6 m)       Intake/Output Summary (Last 24 hours) at 12/14/2016 1243 Last data filed at 12/14/2016 0849 Gross per 24 hour  Intake 1480 ml  Output -  Net 1480 ml   Filed Weights   12/13/16 1500  Weight: 89.8 kg (198 lb)    Examination:  General exam: Appears calm and comfortable  Respiratory system: Clear to auscultation. Respiratory effort normal. Cardiovascular system: S1 & S2 heard, RRR. No JVD, murmurs, rubs, gallops Gastrointestinal system: Abdomen is nondistended, soft and nontender.Normal bowel sounds heard. Central nervous system: Alert and oriented. No focal neurological deficits. Extremities: Symmetric 5 x 5 power. Skin: No rashes, lesions or ulcers Psychiatry: Judgement and insight appear normal. Mood & affect appropriate.     Data Reviewed:   CBC: Recent Labs  Lab 12/13/16 1101  WBC 9.2  NEUTROABS 8.1*  HGB 15.7*  HCT 46.8*  MCV 94.4  PLT 628   Basic Metabolic Panel: Recent Labs  Lab 12/13/16 1101 12/14/16 0626  NA 137 139  K 3.2* 4.2  CL 101 108  CO2 25 26  GLUCOSE 144* 127*  BUN 15 12  CREATININE 0.88 0.84  CALCIUM 8.7* 8.0*  MG 2.1  --    GFR: Estimated Creatinine Clearance: 73.9 mL/min (by C-G formula based on SCr of 0.84 mg/dL). Liver Function Tests: Recent Labs  Lab 12/13/16  1101  AST 26  ALT 17  ALKPHOS 80  BILITOT 0.8  PROT 6.9  ALBUMIN 4.3   No results for input(s): LIPASE, AMYLASE in the last 168 hours. No results for input(s): AMMONIA in the last 168 hours. Coagulation Profile: No results for input(s): INR, PROTIME in the last 168 hours. Cardiac Enzymes: No results for input(s): CKTOTAL, CKMB, CKMBINDEX, TROPONINI in the last 168 hours. BNP (last 3 results) No results for input(s): PROBNP in the last 8760 hours. HbA1C: No results for input(s): HGBA1C in the  last 72 hours. CBG: Recent Labs  Lab 12/13/16 1049  GLUCAP 168*   Lipid Profile: No results for input(s): CHOL, HDL, LDLCALC, TRIG, CHOLHDL, LDLDIRECT in the last 72 hours. Thyroid Function Tests: No results for input(s): TSH, T4TOTAL, FREET4, T3FREE, THYROIDAB in the last 72 hours. Anemia Panel: No results for input(s): VITAMINB12, FOLATE, FERRITIN, TIBC, IRON, RETICCTPCT in the last 72 hours. Urine analysis:    Component Value Date/Time   COLORURINE YELLOW 12/13/2016 1102   APPEARANCEUR CLEAR 12/13/2016 1102   LABSPEC 1.018 12/13/2016 1102   PHURINE 5.0 12/13/2016 1102   GLUCOSEU NEGATIVE 12/13/2016 1102   HGBUR NEGATIVE 12/13/2016 1102   BILIRUBINUR NEGATIVE 12/13/2016 1102   BILIRUBINUR neg 03/07/2014 1459   KETONESUR NEGATIVE 12/13/2016 1102   PROTEINUR 30 (A) 12/13/2016 1102   UROBILINOGEN negative 03/07/2014 1459   UROBILINOGEN 0.2 03/04/2013 1710   NITRITE NEGATIVE 12/13/2016 1102   LEUKOCYTESUR TRACE (A) 12/13/2016 1102   Sepsis Labs: @LABRCNTIP (procalcitonin:4,lacticidven:4)  )No results found for this or any previous visit (from the past 240 hour(s)).       Radiology Studies: Ct Head Wo Contrast  Result Date: 12/13/2016 CLINICAL DATA:  Seizure today. EXAM: CT HEAD WITHOUT CONTRAST CT CERVICAL SPINE WITHOUT CONTRAST TECHNIQUE: Multidetector CT imaging of the head and cervical spine was performed following the standard protocol without intravenous contrast. Multiplanar CT image reconstructions of the cervical spine were also generated. COMPARISON:  MRI cervical spine 10/23/2013. FINDINGS: CT HEAD FINDINGS Brain: Appears normal without hemorrhage, infarct, mass lesion, mass effect, midline shift or abnormal extra-axial fluid collection. No hydrocephalus or pneumocephalus. Vascular: No hyperdense vessel or unexpected calcification. Skull: Intact. Sinuses/Orbits: Negative Other: None. CT CERVICAL SPINE FINDINGS Alignment: Straightening of lordosis is noted. Skull base  and vertebrae: No fracture or focal lesion. The patient is status post C4-7 fusion and right laminotomy at C5-6 and C6-7. Soft tissues and spinal canal: No prevertebral fluid or swelling. No visible canal hematoma. Disc levels: Residual endplate spurring is seen at C6-7 eccentric to the right but the central canal appears open. Upper chest: Negative. Other: None. IMPRESSION: No acute abnormality head or cervical spine. Status post C4-7 fusion. Electronically Signed   By: Inge Rise M.D.   On: 12/13/2016 12:20   Ct Cervical Spine Wo Contrast  Result Date: 12/13/2016 CLINICAL DATA:  Seizure today. EXAM: CT HEAD WITHOUT CONTRAST CT CERVICAL SPINE WITHOUT CONTRAST TECHNIQUE: Multidetector CT imaging of the head and cervical spine was performed following the standard protocol without intravenous contrast. Multiplanar CT image reconstructions of the cervical spine were also generated. COMPARISON:  MRI cervical spine 10/23/2013. FINDINGS: CT HEAD FINDINGS Brain: Appears normal without hemorrhage, infarct, mass lesion, mass effect, midline shift or abnormal extra-axial fluid collection. No hydrocephalus or pneumocephalus. Vascular: No hyperdense vessel or unexpected calcification. Skull: Intact. Sinuses/Orbits: Negative Other: None. CT CERVICAL SPINE FINDINGS Alignment: Straightening of lordosis is noted. Skull base and vertebrae: No fracture or focal lesion. The patient is status post  C4-7 fusion and right laminotomy at C5-6 and C6-7. Soft tissues and spinal canal: No prevertebral fluid or swelling. No visible canal hematoma. Disc levels: Residual endplate spurring is seen at C6-7 eccentric to the right but the central canal appears open. Upper chest: Negative. Other: None. IMPRESSION: No acute abnormality head or cervical spine. Status post C4-7 fusion. Electronically Signed   By: Inge Rise M.D.   On: 12/13/2016 12:20        Scheduled Meds: . busPIRone  20 mg Oral BID  . clonazePAM  0.5 mg Oral  BID  . enoxaparin (LOVENOX) injection  40 mg Subcutaneous Daily  . folic acid  1 mg Oral Daily  . lamoTRIgine  150 mg Oral q morning - 10a  . levothyroxine  50 mcg Oral QAC breakfast  . LORazepam  0-4 mg Intravenous Q6H   Followed by  . [START ON 12/15/2016] LORazepam  0-4 mg Intravenous Q12H  . LORazepam  1 mg Intravenous Once  . multivitamin with minerals  1 tablet Oral Daily  . thiamine  100 mg Intravenous Daily  . thiamine  100 mg Oral Daily   Continuous Infusions: . sodium chloride 75 mL/hr at 12/14/16 1203     LOS: 1 day    Time spent: 27min    Domenic Polite, MD [facility] Page via [email or web address], password [email or web address] After 7PM please contact night-coverage  12/14/2016, 12:43 PM

## 2016-12-14 NOTE — Progress Notes (Signed)
Pt states she thinks she had a seizure while sleeping. She says her tongue is bit and it was not be bit before. No bleeding or blood stains noted at this time. Will continue to monitor.

## 2016-12-15 MED ORDER — CLONAZEPAM 0.5 MG PO TABS: 0.5000 mg | ORAL_TABLET | Freq: Two times a day (BID) | ORAL | 0 refills | Status: DC

## 2016-12-15 NOTE — Discharge Summary (Signed)
Physician Discharge Summary  Tamara Mccullough VZD:[id number] DOB: 1955/09/07 DOA: 12/13/2016  PCP: Raylene Everts, MD  Admit date: 12/13/2016 Discharge date: 12/15/2016  Time spent: 35 minutes  Recommendations for Outpatient Follow-up:  1. PCP in 1 week 2.   Patient has been advised regarding seizure precautions, no driving, operating machinery and to refrain from underwater/limited activities for at least 6 months until seizure-free, and cleared by primary care doctor   Discharge Diagnoses:  Active Problems:   Seizure ([place])   Withdrawal seizures   Anxiety/depression   Alcoholism   Benzodiazepine abuse   Chronic pain syndrome  Discharge Condition: Stable  Diet recommendation: Heart healthy  Filed Weights   12/13/16 1500  Weight: 89.8 kg (198 lb)    History of present illness:  Tamara Fellner Tuckeris a 61 y.o.femalewith PMH of chronic pain syndrome, multiple spinal surgeries, hypertension, hyperthyroidism, alcoholism, benzodiazepine abuse, presented after witnesses seizure. Patient reports that she had been sober for 21 years but started drinking again for the last 1.5 years. Last drink was on Wednesday. She states that she was taking xanax almost daily for the last several years. She says that her doctor stopped her benzos but she was still buying on the streets. She reports that she has been jittery, tremulous and feeling off balance for few days. 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 She was post ictal confused, had tongue biting and urinary incontinent. She reports similar 2 more episodes of seizures this year (winter, summer) when she came off benzodiazepines  Hospital Course:  Seizure [facility]) -Secondary to alcohol and or benzodiazepine withdrawal -Started on low-dose Klonopin at 0.5 mg twice a day stable, no further seizures -Thank you no overt alcohol withdrawal noted -CT head negative for acute findings, EEG without overt epileptiform  discharges -Continue Klonopin at discharge and this will be slowly tapered down as needed and should not be stopped abruptly -Patient has been advised regarding seizure precautions, no driving, operating machinery and to refrain from underwater/limited activities for at least 6 months until seizure-free, and cleared by primary care doctor-    Alcohol abuse -Counseled, was sober for 21 years and then started drinking again last year -As above, no overt withdrawals noted  -She plans to go back to [employer] and is aware of all the resources In the community   Hypokalemia -Replaced, HCTZ resumed    Depression/Anxiety - FU with Psych, Stopped hydroxyzine,  since started on low-dose Klonopin as above  -She is followed closely by her psychiatrist is also on BuSpar, Lamictal and Prozac    Hypothyroidism -continue synthroid    Procedures: EEG  Discharge Exam: Vitals:   12/14/16 2042 12/15/16 0242  BP: 127/71 136/74  Pulse: 69 (!) 50  Resp: 18 18  Temp: 98.7 F (37.1 C) 97.8 F (36.6 C)  SpO2: 96% 94%    General: AAOX3 Cardiovascular: S1S2/RRR Respiratory: CTAB  Discharge Instructions    Current Discharge Medication List    START taking these medications   Details  clonazePAM (KLONOPIN) 0.5 MG tablet Take 1 tablet (0.5 mg total) 2 (two) times daily by mouth. Qty: 30 tablet, Refills: 0      CONTINUE these medications which have NOT CHANGED   Details  busPIRone (BUSPAR) 10 MG tablet Take 2 tablets by mouth 2 (two) times daily.    diclofenac sodium (VOLTAREN) 1 % GEL Apply 4 g topically 4 (four) times daily. Qty: 100 g, Refills: 2    FLUoxetine (PROZAC) 20 MG  capsule Take 3 capsules (60 mg total) by mouth every morning. Qty: 90 capsule, Refills: 0    hydrochlorothiazide (MICROZIDE) 12.5 MG capsule Take 1 capsule (12.5 mg total) by mouth daily. Qty: 90 capsule, Refills: 3    ketoconazole (NIZORAL) 2 % cream Apply 1 application topically daily. Qty: 30 g,  Refills: 1    lamoTRIgine (LAMICTAL) 150 MG tablet Take 1 tablet (150 mg total) by mouth every morning. Qty: 30 tablet, Refills: 0    levothyroxine (SYNTHROID, LEVOTHROID) 50 MCG tablet Take 1 tablet (50 mcg total) by mouth daily. Qty: 30 tablet, Refills: 5    nystatin cream (MYCOSTATIN) APPLY TO AFFECTED AREA TWICE A DAY Qty: 30 g, Refills: 0      STOP taking these medications     fluconazole (DIFLUCAN) 100 MG tablet      hydrOXYzine (ATARAX/VISTARIL) 25 MG tablet        Allergies  Allergen Reactions  . Ambien [Zolpidem Tartrate] Other (See Comments)    forgetful  . Codeine Itching, Nausea And Vomiting, Rash and Other (See Comments)  . Eszopiclone Rash  . Gabapentin Other (See Comments)    Blisters in mouth   . Lansoprazole Palpitations  . Mirapex [Pramipexole] Other (See Comments)    Mouth dry  . Motrin [Ibuprofen] Nausea And Vomiting  . Prilosec [Omeprazole] Palpitations and Other (See Comments)    Makes heart beat fast.  . Quetiapine Other (See Comments)    fainted  . Trazodone And Nefazodone Other (See Comments)    fainted   Follow-up Information    Raylene Everts, MD. Schedule an appointment as soon as possible for a visit in 1 week(s).   Specialty:  Family Medicine Contact information: [address] [address] [phone]            The results of significant diagnostics from this hospitalization (including imaging, microbiology, ancillary and laboratory) are listed below for reference.    Significant Diagnostic Studies: Ct Head Wo Contrast  Result Date: 12/13/2016 CLINICAL DATA:  Seizure today. EXAM: CT HEAD WITHOUT CONTRAST CT CERVICAL SPINE WITHOUT CONTRAST TECHNIQUE: Multidetector CT imaging of the head and cervical spine was performed following the standard protocol without intravenous contrast. Multiplanar CT image reconstructions of the cervical spine were also generated. COMPARISON:  MRI cervical spine 10/23/2013.  FINDINGS: CT HEAD FINDINGS Brain: Appears normal without hemorrhage, infarct, mass lesion, mass effect, midline shift or abnormal extra-axial fluid collection. No hydrocephalus or pneumocephalus. Vascular: No hyperdense vessel or unexpected calcification. Skull: Intact. Sinuses/Orbits: Negative Other: None. CT CERVICAL SPINE FINDINGS Alignment: Straightening of lordosis is noted. Skull base and vertebrae: No fracture or focal lesion. The patient is status post C4-7 fusion and right laminotomy at C5-6 and C6-7. Soft tissues and spinal canal: No prevertebral fluid or swelling. No visible canal hematoma. Disc levels: Residual endplate spurring is seen at C6-7 eccentric to the right but the central canal appears open. Upper chest: Negative. Other: None. IMPRESSION: No acute abnormality head or cervical spine. Status post C4-7 fusion. Electronically Signed   By: Inge Rise M.D.   On: 12/13/2016 12:20   Ct Cervical Spine Wo Contrast  Result Date: 12/13/2016 CLINICAL DATA:  Seizure today. EXAM: CT HEAD WITHOUT CONTRAST CT CERVICAL SPINE WITHOUT CONTRAST TECHNIQUE: Multidetector CT imaging of the head and cervical spine was performed following the standard protocol without intravenous contrast. Multiplanar CT image reconstructions of the cervical spine were also generated. COMPARISON:  MRI cervical spine 10/23/2013. FINDINGS: CT HEAD  FINDINGS Brain: Appears normal without hemorrhage, infarct, mass lesion, mass effect, midline shift or abnormal extra-axial fluid collection. No hydrocephalus or pneumocephalus. Vascular: No hyperdense vessel or unexpected calcification. Skull: Intact. Sinuses/Orbits: Negative Other: None. CT CERVICAL SPINE FINDINGS Alignment: Straightening of lordosis is noted. Skull base and vertebrae: No fracture or focal lesion. The patient is status post C4-7 fusion and right laminotomy at C5-6 and C6-7. Soft tissues and spinal canal: No prevertebral fluid or swelling. No visible canal hematoma.  Disc levels: Residual endplate spurring is seen at C6-7 eccentric to the right but the central canal appears open. Upper chest: Negative. Other: None. IMPRESSION: No acute abnormality head or cervical spine. Status post C4-7 fusion. Electronically Signed   By: Inge Rise M.D.   On: 12/13/2016 12:20    Microbiology: No results found for this or any previous visit (from the past 240 hour(s)).   Labs: Basic Metabolic Panel: Recent Labs  Lab 12/13/16 1101 12/14/16 0626  NA 137 139  K 3.2* 4.2  CL 101 108  CO2 25 26  GLUCOSE 144* 127*  BUN 15 12  CREATININE 0.88 0.84  CALCIUM 8.7* 8.0*  MG 2.1  --    Liver Function Tests: Recent Labs  Lab 12/13/16 1101  AST 26  ALT 17  ALKPHOS 80  BILITOT 0.8  PROT 6.9  ALBUMIN 4.3   No results for input(s): LIPASE, AMYLASE in the last 168 hours. No results for input(s): AMMONIA in the last 168 hours. CBC: Recent Labs  Lab 12/13/16 1101  WBC 9.2  NEUTROABS 8.1*  HGB 15.7*  HCT 46.8*  MCV 94.4  PLT 167   Cardiac Enzymes: No results for input(s): CKTOTAL, CKMB, CKMBINDEX, TROPONINI in the last 168 hours. BNP: BNP (last 3 results) No results for input(s): BNP in the last 8760 hours.  ProBNP (last 3 results) No results for input(s): PROBNP in the last 8760 hours.  CBG: Recent Labs  Lab 12/13/16 1049  GLUCAP 168*       SignedDomenic Polite MD.  [facility] 12/15/2016, 1:06 PM

## 2016-12-17 ENCOUNTER — Telehealth: Payer: Self-pay

## 2016-12-17 NOTE — Telephone Encounter (Signed)
Transition Care Management Follow-up Telephone Call   Date discharged?                How have you been since you were released from the hospital?    Do you understand why you were in the hospital?    Do you understand the discharge instructions?   Where were you discharged to?    Items Reviewed:  Medications reviewed:   Allergies reviewed:   Dietary changes reviewed:   Referrals reviewed:    Functional Questionnaire:   Activities of Daily Living (ADLs):      Any transportation issues/concerns?:    Any patient concerns?    Confirmed importance and date/time of follow-up visits scheduled      Confirmed with patient if condition begins to worsen call PCP or go to the ER.  Patient was given the office number and encouraged to call back with question or concerns.  :   

## 2016-12-17 NOTE — Telephone Encounter (Signed)
Transition Care Management Follow-up Telephone Call   Date discharged?                How have you been since you were released from the hospital? doing good   Do you understand why you were in the hospital? yes   Do you understand the discharge instructions?none   Where were you discharged to?  home  Items Reviewed:  Medications reviewed: yes  Allergies reviewed: yes  Dietary changes reviewed: none  Referrals reviewed: none   Functional Questionnaire:   Activities of Daily Living (ADLs):  no difficulty    Any transportation issues/concerns?: none   Any patient concerns? none   Confirmed importance and date/time of follow-up visits scheduled yes, has an appt Friday @ 1040.     Confirmed with patient if condition begins to worsen call PCP or go to the ER.  Patient was given the office number and encouraged to call back with question or concerns.  : yes

## 2016-12-20 ENCOUNTER — Encounter: Payer: Self-pay | Admitting: Family Medicine

## 2016-12-20 ENCOUNTER — Other Ambulatory Visit: Payer: Self-pay

## 2016-12-20 ENCOUNTER — Ambulatory Visit (INDEPENDENT_AMBULATORY_CARE_PROVIDER_SITE_OTHER): Payer: Medicare Other | Admitting: Family Medicine

## 2016-12-20 VITALS — BP 130/86 | HR 60 | Temp 98.3°F | Resp 16 | Ht 63.0 in | Wt 208.0 lb

## 2016-12-20 DIAGNOSIS — F3177 Bipolar disorder, in partial remission, most recent episode mixed: Secondary | ICD-10-CM

## 2016-12-20 DIAGNOSIS — Z09 Encounter for follow-up examination after completed treatment for conditions other than malignant neoplasm: Secondary | ICD-10-CM | POA: Diagnosis not present

## 2016-12-20 DIAGNOSIS — F5105 Insomnia due to other mental disorder: Secondary | ICD-10-CM | POA: Diagnosis not present

## 2016-12-20 DIAGNOSIS — F1320 Sedative, hypnotic or anxiolytic dependence, uncomplicated: Secondary | ICD-10-CM | POA: Diagnosis not present

## 2016-12-20 DIAGNOSIS — F1020 Alcohol dependence, uncomplicated: Secondary | ICD-10-CM

## 2016-12-20 MED ORDER — CLONAZEPAM 0.5 MG PO TABS: 0.5000 mg | ORAL_TABLET | Freq: Two times a day (BID) | ORAL | 0 refills | Status: DC

## 2016-12-20 NOTE — Patient Instructions (Signed)
Take the clonazepam two times a day Do not run out or stop suddenly See Dr Reece Levy as scheduled for follow up See me in 3  months

## 2016-12-20 NOTE — Progress Notes (Signed)
Chief Complaint  Patient presents with  . Transitions Of Care   Admit date: 12/13/2016 Discharge date: 12/15/2016  Discharge Diagnoses:  Active Problems:   Seizure ([place])   Withdrawal seizures   Anxiety/depression   Alcoholism   Benzodiazepine abuse   Chronic pain syndrome  Patient is here for a transitional visit after leaving the hospital last week.  She is a previously recovered alcoholic who went back to drinking because of "stress".  She previously was on benzodiazepines, but was taken off of them by her psychiatrist.  She proceeded to buy benzodiazepines on the street and continue to take them.  She stopped her alcohol and her benzos suddenly, and then had withdrawal seizures.  She was hospitalized from 12/13/2016 to 12/15/2016 and stabilized.  Her workup was negative.  EEG was negative.  CAT scan was negative.  Lab work reviewed. She has gone home to her apartment.  She lives alone.  She has a sister who lives nearby.  She understands she cannot drive for 6 months.  Her sister brought her to the office today.  She states that she plans on attending AA.  She does not have any regular counseling.  I explained to her that I believe she needs to be under the care of a counselor, that her substance abuse problem is serious enough to need ongoing medical evaluation and care.  She explains that she sees a psychiatrist and has for many years.  She sees him once every 3 months.  I have contacted his office, with her permission, and she will be seen sooner than her scheduled appointment. She was discharged on clonazepam 0.5 mg twice daily.  She is taking these regularly.  She was given a 2-week supply.  Her sister is keeping track of her medications for her.  I explained to her that this is not a medication I am comfortable leaving her on long-term.  I do not prescribe long-term benzodiazepines.  Further is an alcoholic I do not feel it is safe or appropriate for her to stay on them long-term.  I  explained that she needs to be on them now, to prevent seizures, and then tapered off of them slowly as soon as she is able. She complains of insomnia.  She states this is a lot of her problem.  She only sleeps 3 hours a night.  She never quite feels rested are good.  She states this affects her mood.  It makes her stressed.  She asked me for medicine for insomnia.  I decline, and tell her that she needs to allow her psychiatrist to help her manage this.  I certainly would not place her on sedative medication given her history. Patient Active Problem List   Diagnosis Date Noted  . Seizure ([place]) 12/13/2016  . Lumbar disc disorder 11/07/2016  . Gastric bypass status for obesity 08/22/2016  . Seizures ([place]) 09/01/2015  . Lumbar foraminal stenosis 07/17/2015  . OA (osteoarthritis) of knee 07/17/2015  . Insomnia due to anxiety and fear 12/20/2013  . Cervical stenosis of spinal canal 11/09/2013  . RLS (restless legs syndrome) 08/09/2013  . Multinodular goiter 06/09/2013  . Alcoholism in remission ([place]) 03/15/2013  . Essential hypertension, benign 05/20/2012  . GAD (generalized anxiety disorder) 05/20/2012  . Hyperlipidemia with target LDL less than 100 05/20/2012  . Bipolar disorder ([place]) 05/20/2012  . Anemia, vitamin B12 deficiency 07/06/2009  . Obesity 06/29/2009  . PERSONAL HX COLONIC POLYPS 06/29/2009    Outpatient Encounter Medications  as of 12/20/2016  Medication Sig  . busPIRone (BUSPAR) 10 MG tablet Take 2 tablets by mouth 2 (two) times daily.  . clonazePAM (KLONOPIN) 0.5 MG tablet Take 1 tablet (0.5 mg total) 2 (two) times daily by mouth.  . diclofenac sodium (VOLTAREN) 1 % GEL Apply 4 g topically 4 (four) times daily.  Marland Kitchen FLUoxetine (PROZAC) 20 MG capsule Take 3 capsules (60 mg total) by mouth every morning. (Patient taking differently: Take 60 mg by mouth every morning. 2 in am    1 in eve)  . hydrochlorothiazide (MICROZIDE) 12.5 MG capsule Take 1 capsule (12.5 mg total) by mouth  daily.  Marland Kitchen ketoconazole (NIZORAL) 2 % cream Apply 1 application topically daily.  Marland Kitchen lamoTRIgine (LAMICTAL) 150 MG tablet Take 1 tablet (150 mg total) by mouth every morning.  Marland Kitchen levothyroxine (SYNTHROID, LEVOTHROID) 50 MCG tablet Take 1 tablet (50 mcg total) by mouth daily.  Marland Kitchen nystatin cream (MYCOSTATIN) APPLY TO AFFECTED AREA TWICE A DAY   No facility-administered encounter medications on file as of 12/20/2016.     Allergies  Allergen Reactions  . Ambien [Zolpidem Tartrate] Other (See Comments)    forgetful  . Codeine Itching, Nausea And Vomiting, Rash and Other (See Comments)  . Eszopiclone Rash  . Gabapentin Other (See Comments)    Blisters in mouth   . Lansoprazole Palpitations  . Mirapex [Pramipexole] Other (See Comments)    Mouth dry  . Motrin [Ibuprofen] Nausea And Vomiting  . Prilosec [Omeprazole] Palpitations and Other (See Comments)    Makes heart beat fast.  . Quetiapine Other (See Comments)    fainted  . Trazodone And Nefazodone Other (See Comments)    fainted    Review of Systems  Constitutional: Negative for activity change, appetite change and unexpected weight change.  HENT: Negative for congestion, dental problem, postnasal drip and rhinorrhea.   Eyes: Negative for redness and visual disturbance.  Respiratory: Negative for cough and shortness of breath.   Cardiovascular: Negative for chest pain, palpitations and leg swelling.  Gastrointestinal: Negative for abdominal pain, constipation and diarrhea.  Genitourinary: Negative for difficulty urinating, frequency and vaginal bleeding.  Musculoskeletal: Positive for back pain and neck stiffness. Negative for arthralgias.       Chronic  Neurological: Positive for seizures. Negative for dizziness and headaches.  Psychiatric/Behavioral: Positive for sleep disturbance. Negative for dysphoric mood. The patient is nervous/anxious.     BP 130/86 (BP Location: Left Arm, Patient Position: Sitting, Cuff Size: Normal)    Pulse 60   Temp 98.3 F (36.8 C) (Temporal)   Resp 16   Ht 5\' 3"  (1.6 m)   Wt 208 lb 0.6 oz (94.4 kg)   SpO2 96%   BMI 36.85 kg/m   Physical Exam  Constitutional: She is oriented to person, place, and time. She appears well-developed and well-nourished.  HENT:  Head: Normocephalic and atraumatic.  Mouth/Throat: Oropharynx is clear and moist.  Eyes: Conjunctivae are normal. Pupils are equal, round, and reactive to light.  Neck: Normal range of motion. Neck supple.  Cardiovascular: Normal rate, regular rhythm and normal heart sounds.  Pulmonary/Chest: Effort normal and breath sounds normal. No respiratory distress.  Abdominal: Soft. Bowel sounds are normal.  Large abdominal pannus  Musculoskeletal: Normal range of motion. She exhibits no edema.  Neurological: She is alert and oriented to person, place, and time.  Gait , mild limp. Slow movements  Skin: Skin is warm and dry.  Psychiatric: She has a normal mood and affect. Her behavior  is normal. Thought content normal.  Mild anxiety  Nursing note and vitals reviewed.   ASSESSMENT/PLAN:  Alcoholism ([place])  Benzodiazepine dependence ([facility])  Bipolar disorder, in partial remission, most recent episode mixed ([place])  Insomnia due to mental condition  Hospital discharge follow-up    Patient Instructions  Take the clonazepam two times a day Do not run out or stop suddenly See Dr Reece Levy as scheduled for follow up See me in 3  months   Raylene Everts, MD

## 2017-01-05 ENCOUNTER — Other Ambulatory Visit: Payer: Self-pay | Admitting: Family Medicine

## 2017-01-10 ENCOUNTER — Telehealth: Payer: Self-pay | Admitting: *Deleted

## 2017-01-10 NOTE — Telephone Encounter (Signed)
Patient calling in regarding rash under right breast, she has completed the pills that Dr Meda Coffee ordered, and has been using a cream called Nystation and that seems to help it from getting bad, but it is not clearing up.

## 2017-01-10 NOTE — Telephone Encounter (Signed)
May refill nystatin cream

## 2017-01-10 NOTE — Telephone Encounter (Signed)
Patient stated she would like some more cream and pills, I amde her aware Dr Meda Coffee will want to see her again. Patient stated Dr Meda Coffee told her to call back if it didn't work and it work for a little bit and now the rash is back.

## 2017-01-10 NOTE — Telephone Encounter (Signed)
LMTCB

## 2017-01-14 MED ORDER — NYSTATIN 100000 UNIT/GM EX CREA: TOPICAL_CREAM | CUTANEOUS | 0 refills | Status: DC

## 2017-01-14 NOTE — Addendum Note (Signed)
Addended by: Ova Freshwater on: 01/14/2017 10:15 AM   Modules accepted: Orders

## 2017-01-14 NOTE — Telephone Encounter (Signed)
Done, left message for Leda.

## 2017-02-06 ENCOUNTER — Ambulatory Visit: Payer: Self-pay | Admitting: Family Medicine

## 2017-03-24 ENCOUNTER — Ambulatory Visit (INDEPENDENT_AMBULATORY_CARE_PROVIDER_SITE_OTHER): Payer: Medicare Other | Admitting: Family Medicine

## 2017-03-24 ENCOUNTER — Other Ambulatory Visit: Payer: Self-pay

## 2017-03-24 ENCOUNTER — Encounter: Payer: Self-pay | Admitting: Family Medicine

## 2017-03-24 VITALS — BP 130/78 | HR 68 | Temp 97.9°F | Resp 18 | Ht 63.0 in | Wt 216.0 lb

## 2017-03-24 DIAGNOSIS — R238 Other skin changes: Secondary | ICD-10-CM

## 2017-03-24 DIAGNOSIS — R569 Unspecified convulsions: Secondary | ICD-10-CM | POA: Diagnosis not present

## 2017-03-24 DIAGNOSIS — M79605 Pain in left leg: Secondary | ICD-10-CM | POA: Diagnosis not present

## 2017-03-24 DIAGNOSIS — R233 Spontaneous ecchymoses: Secondary | ICD-10-CM

## 2017-03-24 DIAGNOSIS — F1020 Alcohol dependence, uncomplicated: Secondary | ICD-10-CM

## 2017-03-24 DIAGNOSIS — T148XXA Other injury of unspecified body region, initial encounter: Secondary | ICD-10-CM

## 2017-03-24 MED ORDER — DICLOFENAC SODIUM 1 % TD GEL: 4.0000 g | Freq: Four times a day (QID) | TRANSDERMAL | 2 refills | Status: AC

## 2017-03-24 MED ORDER — KETOCONAZOLE 2 % EX CREA: 1.0000 "application " | TOPICAL_CREAM | Freq: Every day | CUTANEOUS | 1 refills | Status: DC

## 2017-03-24 NOTE — Patient Instructions (Signed)
Cream is refilled  Get lab work today I will notify you of your test results  I am referring you to an orthopedic surgeon  See me in 6 months

## 2017-03-24 NOTE — Progress Notes (Signed)
Chief Complaint  Patient presents with  . Follow-up    3 month   Patient is here for her 58-month follow-up. She is compliant with her medications. She has been gaining weight her last couple of visits and this is discussed with her.  Diet, exercise, portions, fats, calories and carbohydrates are all reviewed. Patient had a seizure in October from alcohol withdrawal.  She was told not to drive for 6 months.  She is driving at this time and I reinforced the medical recommendation not to drive for 6 months and why.  She is driving because it is not convenient for others to drive her around.  She had the seizures when she was drinking alcohol.  She tells me that she continues to abstain from alcohol.  She continues to go to counseling. She has a new complaint today.  She is easy bruisability.  She has noticed bruises on her legs over the last several weeks.  There is sometimes quite large.  She does not recall any trauma.  No swelling of the legs. She also has pain in her left lateral leg.  She points to the area of the anterior tibialis muscle.  It is tender to touch.  She states it hurts when she stands for long period of time.  She has been taking Aleve twice a day.  She cannot predict what will cause this pain although it does seem to be worse if she is been standing for long period of time.  The bruising predates the Aleve.   Patient Active Problem List   Diagnosis Date Noted  . Seizure ([place]) 12/13/2016  . Lumbar disc disorder 11/07/2016  . Gastric bypass status for obesity 08/22/2016  . Seizures ([place]) 09/01/2015  . Lumbar foraminal stenosis 07/17/2015  . OA (osteoarthritis) of knee 07/17/2015  . Insomnia due to anxiety and fear 12/20/2013  . Cervical stenosis of spinal canal 11/09/2013  . RLS (restless legs syndrome) 08/09/2013  . Multinodular goiter 06/09/2013  . Alcoholism in remission ([place]) 03/15/2013  . Essential hypertension, benign 05/20/2012  . GAD (generalized anxiety  disorder) 05/20/2012  . Hyperlipidemia with target LDL less than 100 05/20/2012  . Bipolar disorder ([place]) 05/20/2012  . Anemia, vitamin B12 deficiency 07/06/2009  . Obesity 06/29/2009  . PERSONAL HX COLONIC POLYPS 06/29/2009    Outpatient Encounter Medications as of 03/24/2017  Medication Sig  . busPIRone (BUSPAR) 10 MG tablet Take 2 tablets by mouth 2 (two) times daily.  . clonazePAM (KLONOPIN) 0.5 MG tablet Take 1 tablet (0.5 mg total) 2 (two) times daily by mouth.  . diclofenac sodium (VOLTAREN) 1 % GEL Apply 4 g topically 4 (four) times daily.  Marland Kitchen doxepin (SINEQUAN) 75 MG capsule   . FLUoxetine (PROZAC) 20 MG capsule Take 3 capsules (60 mg total) by mouth every morning. (Patient taking differently: Take 60 mg by mouth every morning. 2 in am    1 in eve)  . hydrochlorothiazide (MICROZIDE) 12.5 MG capsule Take 1 capsule (12.5 mg total) by mouth daily.  Marland Kitchen ketoconazole (NIZORAL) 2 % cream Apply 1 application topically daily.  Marland Kitchen lamoTRIgine (LAMICTAL) 150 MG tablet Take 1 tablet (150 mg total) by mouth every morning.  Marland Kitchen levothyroxine (SYNTHROID, LEVOTHROID) 50 MCG tablet Take 1 tablet (50 mcg total) by mouth daily.  Marland Kitchen nystatin cream (MYCOSTATIN) APPLY TO AFFECTED AREA TWICE A DAY  . hydrOXYzine (ATARAX/VISTARIL) 25 MG tablet TAKE 1 TABLET BY MOUTH AS NEEDED, NO MORE THAN 6 TABLETS PER DAY  No facility-administered encounter medications on file as of 03/24/2017.     Allergies  Allergen Reactions  . Ambien [Zolpidem Tartrate] Other (See Comments)    forgetful  . Codeine Itching, Nausea And Vomiting, Rash and Other (See Comments)  . Eszopiclone Rash  . Gabapentin Other (See Comments)    Blisters in mouth   . Lansoprazole Palpitations  . Mirapex [Pramipexole] Other (See Comments)    Mouth dry  . Motrin [Ibuprofen] Nausea And Vomiting  . Prilosec [Omeprazole] Palpitations and Other (See Comments)    Makes heart beat fast.  . Quetiapine Other (See Comments)    fainted  . Trazodone And  Nefazodone Other (See Comments)    fainted    Review of Systems  Constitutional: Negative for activity change, appetite change and unexpected weight change.  HENT: Negative for congestion, dental problem, postnasal drip and rhinorrhea.   Eyes: Negative for redness and visual disturbance.  Respiratory: Negative for cough and shortness of breath.   Cardiovascular: Negative for chest pain, palpitations and leg swelling.  Gastrointestinal: Negative for abdominal pain, constipation and diarrhea.  Genitourinary: Negative for difficulty urinating, frequency and vaginal bleeding.  Musculoskeletal: Positive for back pain and neck stiffness. Negative for arthralgias.       Chronic  Skin: Positive for color change.  Neurological: Positive for seizures. Negative for dizziness and headaches.       In October 2018  Psychiatric/Behavioral: Positive for sleep disturbance. Negative for dysphoric mood. The patient is nervous/anxious.        Chronic, under care of psychiatry    BP 130/78 (BP Location: Left Arm, Patient Position: Sitting, Cuff Size: Large)   Pulse 68   Temp 97.9 F (36.6 C) (Temporal)   Resp 18   Ht 5\' 3"  (1.6 m)   Wt 216 lb 0.6 oz (98 kg)   SpO2 97%   BMI 38.27 kg/m   Physical Exam  Constitutional: She is oriented to person, place, and time. She appears well-developed and well-nourished.  HENT:  Head: Normocephalic and atraumatic.  Mouth/Throat: Oropharynx is clear and moist.  Eyes: Conjunctivae are normal. Pupils are equal, round, and reactive to light.  Neck: Normal range of motion. Neck supple.  Cardiovascular: Normal rate, regular rhythm and normal heart sounds.  Pulmonary/Chest: Effort normal and breath sounds normal. No respiratory distress.  Abdominal: Soft. Bowel sounds are normal.  Large abdominal pannus  Musculoskeletal: Normal range of motion. She exhibits no edema.  Neurological: She is alert and oriented to person, place, and time.  Gait , mild limp. Slow  movements  Skin: Skin is warm and dry.  Lower legs are examined.  On the medial portion of each knee there is bruising, each of them 6-10 cm across.  Another bruise on the outer portion of the left leg.  Tender anterior tibialis muscle.  Full knee and ankle range of motion.  No sensory deficit.  Reflexes normal.  Straight leg raise negative.  Psychiatric: She has a normal mood and affect. Her behavior is normal. Thought content normal.  Nursing note and vitals reviewed.   ASSESSMENT/PLAN:  1. Bruises easily I worry with patient's alcoholism that she could have cirrhosis and low platelet count.  We will get blood work - CBC with Differential/Platelet - Fibrinogen - INR/PT 2.  Left leg pain 3.  Alcoholism 4.  Seizures   Patient Instructions  Cream is refilled  Get lab work today I will notify you of your test results  I am referring you to an  orthopedic surgeon  See me in 6 months   Raylene Everts, MD

## 2017-03-25 LAB — CBC WITH DIFFERENTIAL/PLATELET
BASOS PCT: 0.9 %
Basophils Absolute: 49 cells/uL (ref 0–200)
EOS PCT: 7.6 %
Eosinophils Absolute: 410 cells/uL (ref 15–500)
HEMATOCRIT: 40.4 % (ref 35.0–45.0)
HEMOGLOBIN: 13.3 g/dL (ref 11.7–15.5)
LYMPHS ABS: 1625 {cells}/uL (ref 850–3900)
MCH: 29.6 pg (ref 27.0–33.0)
MCHC: 32.9 g/dL (ref 32.0–36.0)
MCV: 90 fL (ref 80.0–100.0)
MPV: 11.8 fL (ref 7.5–12.5)
Monocytes Relative: 7 %
NEUTROS ABS: 2938 {cells}/uL (ref 1500–7800)
Neutrophils Relative %: 54.4 %
Platelets: 193 10*3/uL (ref 140–400)
RBC: 4.49 10*6/uL (ref 3.80–5.10)
RDW: 13.7 % (ref 11.0–15.0)
Total Lymphocyte: 30.1 %
WBC: 5.4 10*3/uL (ref 3.8–10.8)
WBCMIX: 378 {cells}/uL (ref 200–950)

## 2017-03-25 LAB — FIBRINOGEN: FIBRINOGEN: 351 mg/dL (ref 175–425)

## 2017-03-25 LAB — PROTIME-INR
INR: 0.9
Prothrombin Time: 10 s (ref 9.0–11.5)

## 2017-04-11 ENCOUNTER — Telehealth: Payer: Self-pay | Admitting: Family Medicine

## 2017-04-11 NOTE — Telephone Encounter (Signed)
Patient informed of message below, verbalized understanding.  

## 2017-04-11 NOTE — Telephone Encounter (Signed)
Is there anything I need to tell patient regarding labs?

## 2017-04-11 NOTE — Telephone Encounter (Signed)
Patient called in to ask when she will be receiving her labs, I let her know that if something is abnormal our office contacts her immediately, other than that she will receive results in the mail. She also states that she is still having leg pain that was not resolved by the physician in her last office visit. I let her know a referral was placed to orthopedics during her last office visit.

## 2017-04-11 NOTE — Telephone Encounter (Signed)
She is not getting her labs in the mail because she is on my chart.  I release them to her on my chart the day after they were done.  They are all normal.

## 2017-04-14 ENCOUNTER — Encounter: Payer: Self-pay | Admitting: Family Medicine

## 2017-04-23 ENCOUNTER — Ambulatory Visit: Payer: Self-pay | Admitting: Orthopedic Surgery
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
## 2017-04-29 ENCOUNTER — Encounter (HOSPITAL_COMMUNITY): Payer: Self-pay | Admitting: Psychiatry

## 2017-04-29 ENCOUNTER — Ambulatory Visit (INDEPENDENT_AMBULATORY_CARE_PROVIDER_SITE_OTHER): Payer: Medicare Other | Admitting: Psychiatry

## 2017-04-29 VITALS — BP 133/84 | HR 57 | Ht 63.0 in | Wt 220.0 lb

## 2017-04-29 DIAGNOSIS — R4584 Anhedonia: Secondary | ICD-10-CM

## 2017-04-29 DIAGNOSIS — F1020 Alcohol dependence, uncomplicated: Secondary | ICD-10-CM

## 2017-04-29 DIAGNOSIS — Z87891 Personal history of nicotine dependence: Secondary | ICD-10-CM

## 2017-04-29 DIAGNOSIS — R450 Nervousness: Secondary | ICD-10-CM | POA: Diagnosis not present

## 2017-04-29 DIAGNOSIS — Z811 Family history of alcohol abuse and dependence: Secondary | ICD-10-CM

## 2017-04-29 DIAGNOSIS — G4700 Insomnia, unspecified: Secondary | ICD-10-CM

## 2017-04-29 DIAGNOSIS — F0630 Mood disorder due to known physiological condition, unspecified: Secondary | ICD-10-CM

## 2017-04-29 DIAGNOSIS — F419 Anxiety disorder, unspecified: Secondary | ICD-10-CM

## 2017-04-29 DIAGNOSIS — Z818 Family history of other mental and behavioral disorders: Secondary | ICD-10-CM

## 2017-04-29 DIAGNOSIS — F1290 Cannabis use, unspecified, uncomplicated: Secondary | ICD-10-CM

## 2017-04-29 MED ORDER — DOXEPIN HCL 50 MG PO CAPS: 50.0000 mg | ORAL_CAPSULE | Freq: Every day | ORAL | 1 refills | Status: DC

## 2017-04-29 MED ORDER — LAMOTRIGINE 150 MG PO TABS: 150.0000 mg | ORAL_TABLET | Freq: Every morning | ORAL | 0 refills | Status: DC

## 2017-04-29 MED ORDER — FLUOXETINE HCL 20 MG PO CAPS: 60.0000 mg | ORAL_CAPSULE | Freq: Every day | ORAL | 1 refills | Status: DC

## 2017-04-29 MED ORDER — NALTREXONE HCL 50 MG PO TABS: 50.0000 mg | ORAL_TABLET | Freq: Every day | ORAL | 1 refills | Status: DC

## 2017-04-29 NOTE — Patient Instructions (Signed)
1. Continue fluoxetine 60 mg daily 2. Continue lamotrigine 150 mg daily  3. Continue Buspar 10 mg BID 4. Continue hydroxyzine 25 mg daily prn for anxiety 5. Decrease doxepin 50 mg at night  6. Start naltrexone 50 mg daily  7. Return to clinic in one month for 30 mins 8. She will contact insurance for [place] program

## 2017-05-07 ENCOUNTER — Encounter (HOSPITAL_COMMUNITY): Payer: Self-pay | Admitting: Emergency Medicine

## 2017-05-07 ENCOUNTER — Emergency Department (HOSPITAL_COMMUNITY): Payer: Medicare Other

## 2017-05-07 ENCOUNTER — Other Ambulatory Visit: Payer: Self-pay

## 2017-05-07 ENCOUNTER — Emergency Department (HOSPITAL_COMMUNITY)
Admission: EM | Admit: 2017-05-07 | Discharge: 2017-05-07 | Disposition: A | Discharge status: Home / Self Care | Payer: Medicare Other | Attending: Emergency Medicine | Admitting: Emergency Medicine

## 2017-05-07 DIAGNOSIS — J45909 Unspecified asthma, uncomplicated: Secondary | ICD-10-CM | POA: Diagnosis not present

## 2017-05-07 DIAGNOSIS — I10 Essential (primary) hypertension: Secondary | ICD-10-CM | POA: Insufficient documentation

## 2017-05-07 DIAGNOSIS — Z87891 Personal history of nicotine dependence: Secondary | ICD-10-CM | POA: Insufficient documentation

## 2017-05-07 DIAGNOSIS — M79605 Pain in left leg: Secondary | ICD-10-CM | POA: Insufficient documentation

## 2017-05-07 DIAGNOSIS — M7989 Other specified soft tissue disorders: Secondary | ICD-10-CM | POA: Diagnosis not present

## 2017-05-07 DIAGNOSIS — Z79899 Other long term (current) drug therapy: Secondary | ICD-10-CM | POA: Diagnosis not present

## 2017-05-07 DIAGNOSIS — M79662 Pain in left lower leg: Secondary | ICD-10-CM | POA: Diagnosis not present

## 2017-05-07 MED ORDER — TRAMADOL HCL 50 MG PO TABS: 50.0000 mg | ORAL_TABLET | Freq: Two times a day (BID) | ORAL | 0 refills | Status: DC | PRN

## 2017-05-07 NOTE — ED Triage Notes (Signed)
Pt c/o of left leg and hip pain x 3 months. Denies injury in the past.  Has appt to see ortho the 15th.

## 2017-05-07 NOTE — ED Provider Notes (Signed)
[facility] EMERGENCY DEPARTMENT Provider Note   CSN: [id number] Arrival date & time: 05/07/17  1131     History   Chief Complaint Chief Complaint  Patient presents with  . Leg Pain    HPI Tamara Mccullough is a 62 y.o. female presenting for evaluation of L leg pain.   Pt states she has been having pain of her L leg for the past several weeks. 2 days ago, her pain suddenly worsened, and is now shooting up her leg, behind her knee, to her hip. She denies new fall, trauma or injury. She denies numbness or tingling. She has been taking tylenol without improvement.  No recent travel, surgeries, trauma, or immobilization.  No history of cancer or blood clot.  She is not on OCPs.  Standing makes the pain worse, nothing makes it better.  She denies pain on the left side. She denies injury of the leg in the past, although has a h/o back problems. This feels different. She has an apt with dr. Aline Brochure from orthopedics on 4/15.   HPI  Past Medical History:  Diagnosis Date  . Allergy   . Anemia   . Arthritis    back   . Asthma    slight  . Chronic back pain   . Depression   . Depression   . Edema   . Hyperlipidemia   . Hypertension   . Insomnia   . Morbid obesity ([place])   . Neuromuscular disorder ([place])   . Overdose of sleeping tabs 03/10/2013  . Panic attacks   . PONV (postoperative nausea and vomiting)   . Restless leg syndrome   . Seizures ([place])    hx of seizure 05/2013   . Sleep apnea    has not used CPAP in 3 years   . Thyroid disease     Patient Active Problem List   Diagnosis Date Noted  . Seizure ([place]) 12/13/2016  . Lumbar disc disorder 11/07/2016  . Gastric bypass status for obesity 08/22/2016  . Seizures ([place]) 09/01/2015  . Lumbar foraminal stenosis 07/17/2015  . OA (osteoarthritis) of knee 07/17/2015  . Alcohol use disorder, severe, dependence ([place])   . Insomnia due to anxiety and fear 12/20/2013  . Cervical stenosis of spinal canal 11/09/2013  . RLS (restless  legs syndrome) 08/09/2013  . Multinodular goiter 06/09/2013  . Alcoholism in remission ([place]) 03/15/2013  . Essential hypertension, benign 05/20/2012  . GAD (generalized anxiety disorder) 05/20/2012  . Hyperlipidemia with target LDL less than 100 05/20/2012  . Mood disorder in conditions classified elsewhere 05/20/2012  . Anemia, vitamin B12 deficiency 07/06/2009  . Obesity 06/29/2009  . PERSONAL HX COLONIC POLYPS 06/29/2009    Past Surgical History:  Procedure Laterality Date  . ANTERIOR CERVICAL DECOMP/DISCECTOMY FUSION N/A 11/09/2013   Procedure:  Anterior cervical decompression/diskectomy/fusion cervical four-five ,cervical five-six,cervical six-seven;  Surgeon: Floyce Stakes, MD;  Location: [facility];  Service: Neurosurgery;  Laterality: N/A;  . BACK SURGERY    . CTR    . gastric by pass  6/11  . MINOR HEMORRHOIDECTOMY    . SPINE SURGERY    . THYROID LOBECTOMY Right 08/12/2013   Procedure: RIGHT THYROID LOBECTOMY;  Surgeon: Odis Hollingshead, MD;  Location: [facility] ORS;  Service: General;  Laterality: Right;  . TUBAL LIGATION       OB History   None      Home Medications    Prior to Admission medications   Medication Sig Start Date End  Date Taking? Authorizing Provider  Acetaminophen (ARTHRITIS PAIN PO) Take by mouth.    [provider]  busPIRone (BUSPAR) 10 MG tablet Take 2 tablets by mouth 2 (two) times daily. 03/06/16   [provider]  diclofenac sodium (VOLTAREN) 1 % GEL Apply 4 g topically 4 (four) times daily. 03/24/17   Raylene Everts, MD  doxepin (SINEQUAN) 50 MG capsule Take 1 capsule (50 mg total) by mouth at bedtime. 04/29/17   Norman Clay, MD  FLUoxetine (PROZAC) 20 MG capsule Take 3 capsules (60 mg total) by mouth daily. 04/29/17   Norman Clay, MD  hydrochlorothiazide (MICROZIDE) 12.5 MG capsule Take 1 capsule (12.5 mg total) by mouth daily. 08/22/16   Raylene Everts, MD  hydrOXYzine (ATARAX/VISTARIL) 25 MG tablet TAKE 1 TABLET BY  MOUTH AS NEEDED, NO MORE THAN 6 TABLETS PER DAY 01/12/17   [provider]  ketoconazole (NIZORAL) 2 % cream Apply 1 application topically daily. 03/24/17   Raylene Everts, MD  lamoTRIgine (LAMICTAL) 150 MG tablet Take 1 tablet (150 mg total) by mouth every morning. 04/29/17   Norman Clay, MD  naltrexone (DEPADE) 50 MG tablet Take 1 tablet (50 mg total) by mouth daily. 04/29/17   Norman Clay, MD  nystatin cream (MYCOSTATIN) APPLY TO AFFECTED AREA TWICE A DAY 01/14/17   Raylene Everts, MD  traMADol (ULTRAM) 50 MG tablet Take 1 tablet (50 mg total) by mouth every 12 (twelve) hours as needed for severe pain. 05/07/17   Malavika Lira, PA-C    Family History Family History  Problem Relation Age of Onset  . Congestive Heart Failure Mother   . Diabetes Mother        died at 44  . Cancer Mother        cervical  . Arthritis Mother   . Asthma Mother   . Depression Mother   . Heart disease Mother   . Hypertension Mother   . Congestive Heart Failure Father   . Emphysema Father   . Alcohol abuse Father   . Diabetes Father   . Arthritis Father   . COPD Father   . Depression Father   . Heart disease Father        heart attack died at 36  . Hypertension Father   . Stroke Father   . Alcohol abuse Sister   . Suicidality Sister   . Alcohol abuse Brother   . Alcohol abuse Paternal Grandfather   . Alcohol abuse Brother   . Alcohol abuse Brother     Social History Social History   Tobacco Use  . Smoking status: Former Smoker    Last attempt to quit: 02/05/1996    Years since quitting: 21.2  . Smokeless tobacco: Never Used  Substance Use Topics  . Alcohol use: Not Currently    Frequency: Never    Comment: heavy liquor for the last year   . Drug use: Not Currently    Types: Benzodiazepines     Allergies   Ambien [zolpidem tartrate]; Codeine; Eszopiclone; Gabapentin; Lansoprazole; Mirapex [pramipexole]; Motrin [ibuprofen]; Prilosec [omeprazole]; Quetiapine; and  Trazodone and nefazodone   Review of Systems Review of Systems  Musculoskeletal: Positive for myalgias.  Skin: Negative for wound.  Neurological: Negative for numbness.  Hematological: Does not bruise/bleed easily.     Physical Exam Updated Vital Signs BP (!) 141/60   Pulse 62   Temp 99 F (37.2 C) (Oral)   Resp 18   Ht 5\' 4"  (1.626 m)  Wt 93 kg (205 lb)   SpO2 99%   BMI 35.19 kg/m   Physical Exam  Constitutional: She is oriented to person, place, and time. She appears well-developed and well-nourished. No distress.  HENT:  Head: Normocephalic and atraumatic.  Eyes: EOM are normal.  Neck: Normal range of motion.  Pulmonary/Chest: Effort normal.  Abdominal: She exhibits no distension.  Musculoskeletal: Normal range of motion. She exhibits tenderness. She exhibits no deformity.  No obvious deformity of the left leg.  Contusion noted on the lateral aspect of the lower leg, patient states has been there since beginning.  No warmth, redness, or swelling.  Tenderness topalpation of the lateral lower leg, popliteal knee, and posterior thigh.  Pedal pulses intact bilaterally.  Color and warmth equal bilaterally.  Sensation intact bilaterally.  Neurological: She is alert and oriented to person, place, and time. She displays normal reflexes. No sensory deficit.  Skin: Skin is warm. No rash noted.  Psychiatric: She has a normal mood and affect.  Nursing note and vitals reviewed.    ED Treatments / Results  Labs (all labs ordered are listed, but only abnormal results are displayed) Labs Reviewed - No data to display  EKG None  Radiology US Venous Img Lower  Left (dvt Study)  Result Date: 05/07/2017 CLINICAL DATA:  Acute left leg pain and swelling persisting for 3 months EXAM: LEFT LOWER EXTREMITY VENOUS DOPPLER ULTRASOUND TECHNIQUE: Gray-scale sonography with graded compression, as well as color Doppler and duplex ultrasound were performed to evaluate the lower extremity deep  venous systems from the level of the common femoral vein and including the common femoral, femoral, profunda femoral, popliteal and calf veins including the posterior tibial, peroneal and gastrocnemius veins when visible. The superficial great saphenous vein was also interrogated. Spectral Doppler was utilized to evaluate flow at rest and with distal augmentation maneuvers in the common femoral, femoral and popliteal veins. COMPARISON:  None. FINDINGS: Contralateral Common Femoral Vein: Respiratory phasicity is normal and symmetric with the symptomatic side. No evidence of thrombus. Normal compressibility. Common Femoral Vein: No evidence of thrombus. Normal compressibility, respiratory phasicity and response to augmentation. Saphenofemoral Junction: No evidence of thrombus. Normal compressibility and flow on color Doppler imaging. Profunda Femoral Vein: No evidence of thrombus. Normal compressibility and flow on color Doppler imaging. Femoral Vein: No evidence of thrombus. Normal compressibility, respiratory phasicity and response to augmentation. Popliteal Vein: No evidence of thrombus. Normal compressibility, respiratory phasicity and response to augmentation. Calf Veins: No evidence of thrombus. Normal compressibility and flow on color Doppler imaging. Superficial Great Saphenous Vein: No evidence of thrombus. Normal compressibility. Venous Reflux:  None. Other Findings:  None. IMPRESSION: No evidence of deep venous thrombosis. Electronically Signed   By: Jerilynn Mages.  Shick M.D.   On: 05/07/2017 13:04    Procedures Procedures (including critical care time)  Medications Ordered in ED Medications - No data to display   Initial Impression / Assessment and Plan / ED Course  I have reviewed the triage vital signs and the nursing notes.  Pertinent labs & imaging results that were available during my care of the patient were reviewed by me and considered in my medical decision making (see chart for details).      Patient presenting for evaluation of left leg pain.  Physical exam reassuring, she is neurovascularly intact.  Pain is been present for several weeks, worsened recently.  Is now shooting up her leg posteriorly.  Will obtain ultrasound to rule out blood clot.  No risk  factors for blood clot at this time.  Ultrasound negative for DVT.  Discussed findings with patient.  Discussed that at this time, doubt x-rays would be beneficial, as I doubt fracture or dislocation.  Likely MSK related.  Will have patient treat symptomatically with Tylenol, ibuprofen, and tramadol as needed.  PMP checked, patient without prescription for controlled substances in the past 2 years.  Follow-up with orthopedics encouraged.  At this time, patient present for discharge.  Return precautions given.  Patient states she understands and agrees to plan.   Final Clinical Impressions(s) / ED Diagnoses   Final diagnoses:  Left leg pain    ED Discharge Orders        Ordered    traMADol (ULTRAM) 50 MG tablet  Every 12 hours PRN     05/07/17 1321       Zalayah Pizzuto, PA-C 05/07/17 1443    Julianne Rice, MD 05/07/17 1523

## 2017-05-07 NOTE — Discharge Instructions (Signed)
Take 2 aleve tablets two times a day with meals.  Do not take other anti-inflammatories at the same time open (Advil, Motrin, ibuprofen, naproxen).  You may continue to take arthritis 8 hour with this.  Take tramadol as needed for severe, breakthrough pain. Have caution as this can make you tired or groggy. Do not drive or operate heavy machinery while taking this medication.  It is important that you follow up with the orthopedic doctor for further evaluation.  Return to the ER if you develop numbness, inability to move your leg, if your foot turns white, or with any new or concerning symptoms.

## 2017-05-19 ENCOUNTER — Encounter: Payer: Self-pay | Admitting: Orthopedic Surgery

## 2017-05-19 ENCOUNTER — Ambulatory Visit (INDEPENDENT_AMBULATORY_CARE_PROVIDER_SITE_OTHER): Payer: Medicare Other

## 2017-05-19 ENCOUNTER — Ambulatory Visit (INDEPENDENT_AMBULATORY_CARE_PROVIDER_SITE_OTHER): Payer: Medicare Other | Admitting: Orthopedic Surgery

## 2017-05-19 VITALS — BP 131/93 | HR 57 | Ht 63.0 in | Wt 224.0 lb

## 2017-05-19 DIAGNOSIS — M25552 Pain in left hip: Secondary | ICD-10-CM | POA: Diagnosis not present

## 2017-05-19 NOTE — Progress Notes (Signed)
NEW PATIENT OFFICE VISIT   Chief Complaint  Patient presents with  . Hip Pain    left hip pain / denies low back pain     62 years old comes in for 78-month history of left hip pain status post multiple lumbar disc surgeries by Dr. Joya Salm  Patient referred by Dr. Meda Coffee  Patient complains of posterolateral left buttock pain and anterolateral left leg pain severe interfering with her daily activities again present for 3 months associated with difficulty getting out of a chair and walking  Denies groin pain or thigh pain   Review of Systems  Constitutional: Negative for fever and weight loss.  Musculoskeletal:       Plantar foot pain  Neurological: Negative for tingling, sensory change and focal weakness.     Past Medical History:  Diagnosis Date  . Allergy   . Anemia   . Arthritis    back   . Asthma    slight  . Chronic back pain   . Depression   . Depression   . Edema   . Hyperlipidemia   . Hypertension   . Insomnia   . Morbid obesity ([place])   . Neuromuscular disorder ([place])   . Overdose of sleeping tabs 03/10/2013  . Panic attacks   . PONV (postoperative nausea and vomiting)   . Restless leg syndrome   . Seizures ([place])    hx of seizure 05/2013   . Sleep apnea    has not used CPAP in 3 years   . Thyroid disease     Past Surgical History:  Procedure Laterality Date  . ANTERIOR CERVICAL DECOMP/DISCECTOMY FUSION N/A 11/09/2013   Procedure:  Anterior cervical decompression/diskectomy/fusion cervical four-five ,cervical five-six,cervical six-seven;  Surgeon: Floyce Stakes, MD;  Location: [facility];  Service: Neurosurgery;  Laterality: N/A;  . BACK SURGERY    . CTR    . gastric by pass  6/11  . MINOR HEMORRHOIDECTOMY    . SPINE SURGERY    . THYROID LOBECTOMY Right 08/12/2013   Procedure: RIGHT THYROID LOBECTOMY;  Surgeon: Odis Hollingshead, MD;  Location: [facility];  Service: General;  Laterality: Right;  . TUBAL LIGATION      Family History  Problem Relation  Age of Onset  . Congestive Heart Failure Mother   . Diabetes Mother        died at 73  . Cancer Mother        cervical  . Arthritis Mother   . Asthma Mother   . Depression Mother   . Heart disease Mother   . Hypertension Mother   . Congestive Heart Failure Father   . Emphysema Father   . Alcohol abuse Father   . Diabetes Father   . Arthritis Father   . COPD Father   . Depression Father   . Heart disease Father        heart attack died at 64  . Hypertension Father   . Stroke Father   . Alcohol abuse Sister   . Suicidality Sister   . Alcohol abuse Brother   . Alcohol abuse Paternal Grandfather   . Alcohol abuse Brother   . Alcohol abuse Brother    Social History   Tobacco Use  . Smoking status: Former Smoker    Last attempt to quit: 02/05/1996    Years since quitting: 21.2  . Smokeless tobacco: Never Used  Substance Use Topics  . Alcohol use: Not Currently    Frequency: Never  Comment: heavy liquor for the last year   . Drug use: Not Currently    Types: Benzodiazepines    @ALL @  Current Meds  Medication Sig  . Acetaminophen (ARTHRITIS PAIN PO) Take by mouth.  . busPIRone (BUSPAR) 10 MG tablet Take 2 tablets by mouth 2 (two) times daily.  . diclofenac sodium (VOLTAREN) 1 % GEL Apply 4 g topically 4 (four) times daily.  Marland Kitchen doxepin (SINEQUAN) 50 MG capsule Take 1 capsule (50 mg total) by mouth at bedtime.  Marland Kitchen FLUoxetine (PROZAC) 20 MG capsule Take 3 capsules (60 mg total) by mouth daily.  . hydrochlorothiazide (MICROZIDE) 12.5 MG capsule Take 1 capsule (12.5 mg total) by mouth daily.  . hydrOXYzine (ATARAX/VISTARIL) 25 MG tablet TAKE 1 TABLET BY MOUTH AS NEEDED, NO MORE THAN 6 TABLETS PER DAY  . ketoconazole (NIZORAL) 2 % cream Apply 1 application topically daily.  Marland Kitchen lamoTRIgine (LAMICTAL) 150 MG tablet Take 1 tablet (150 mg total) by mouth every morning.  . naltrexone (DEPADE) 50 MG tablet Take 1 tablet (50 mg total) by mouth daily.  Marland Kitchen nystatin cream (MYCOSTATIN)  APPLY TO AFFECTED AREA TWICE A DAY    BP (!) 131/93   Pulse (!) 57   Ht 5\' 3"  (1.6 m)   Wt 224 lb (101.6 kg)   BMI 39.68 kg/m   Physical Exam  Constitutional: She is oriented to person, place, and time. She appears well-developed and well-nourished.  Neurological: She is alert and oriented to person, place, and time.  Psychiatric: She has a normal mood and affect. Judgment normal.  Vitals reviewed.   Ortho Exam Right hip exam shows normal range of motion stability strength and alignment and equal leg length.  Strength is normal in hip flexion.  Skin is normal pulses are good sensation is intact  Left hip alignment and range of motion match right hip there is no instability weakness.  There are no skin lesions.  Neurovascular exam is intact.  Her pain is palpable and tender in the left buttock.  She has tenderness in the anterolateral leg.  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  Yes  My independent reading of xrays: See dictated report  AP pelvis AP lateral left  hip.  X-ray shows no fracture dislocation AVN or arthritis   Encounter Diagnosis  Name Primary?  . Left hip pain Yes     PLAN:    Injection?  No MRI/CT/?  No  Patient referred back to her primary care physician and to [place] [facility] for further workup of her radicular symptoms

## 2017-05-21 ENCOUNTER — Other Ambulatory Visit (HOSPITAL_COMMUNITY): Payer: Self-pay | Admitting: Psychiatry

## 2017-05-21 MED ORDER — DOXEPIN HCL 50 MG PO CAPS: 50.0000 mg | ORAL_CAPSULE | Freq: Every day | ORAL | 1 refills | Status: DC

## 2017-05-21 MED ORDER — FLUOXETINE HCL 20 MG PO CAPS: 60.0000 mg | ORAL_CAPSULE | Freq: Every day | ORAL | 1 refills | Status: DC
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
## 2017-06-09 ENCOUNTER — Encounter (HOSPITAL_COMMUNITY): Payer: Self-pay | Admitting: Psychiatry

## 2017-06-09 ENCOUNTER — Ambulatory Visit (INDEPENDENT_AMBULATORY_CARE_PROVIDER_SITE_OTHER): Payer: Medicare Other | Admitting: Psychiatry

## 2017-06-09 VITALS — BP 135/84 | HR 61 | Ht 63.5 in | Wt 229.0 lb

## 2017-06-09 DIAGNOSIS — Z818 Family history of other mental and behavioral disorders: Secondary | ICD-10-CM | POA: Diagnosis not present

## 2017-06-09 DIAGNOSIS — Z811 Family history of alcohol abuse and dependence: Secondary | ICD-10-CM | POA: Diagnosis not present

## 2017-06-09 DIAGNOSIS — F1020 Alcohol dependence, uncomplicated: Secondary | ICD-10-CM | POA: Diagnosis not present

## 2017-06-09 DIAGNOSIS — F419 Anxiety disorder, unspecified: Secondary | ICD-10-CM

## 2017-06-09 DIAGNOSIS — F0630 Mood disorder due to known physiological condition, unspecified: Secondary | ICD-10-CM

## 2017-06-09 DIAGNOSIS — Z87891 Personal history of nicotine dependence: Secondary | ICD-10-CM

## 2017-06-09 DIAGNOSIS — R450 Nervousness: Secondary | ICD-10-CM

## 2017-06-09 MED ORDER — LAMOTRIGINE 150 MG PO TABS: 150.0000 mg | ORAL_TABLET | Freq: Every morning | ORAL | 0 refills | Status: DC

## 2017-06-09 NOTE — Patient Instructions (Signed)
1. Continue fluoxetine 60 mg daily 2. Continue lamotrigine 150 mg daily  3. Continue Buspar 20 mg BID 4. Continue hydroxyzine 50 mg twice a day  5. Reinitiate doxepin 100 mg at night  6. Continue naltrexone 50 mg daily  7. Return to clinic in one month for 30 mins

## 2017-06-11 DIAGNOSIS — M5416 Radiculopathy, lumbar region: Secondary | ICD-10-CM | POA: Diagnosis not present

## 2017-06-11 DIAGNOSIS — I10 Essential (primary) hypertension: Secondary | ICD-10-CM | POA: Diagnosis not present

## 2017-06-11 DIAGNOSIS — Z6835 Body mass index (BMI) 35.0-35.9, adult: Secondary | ICD-10-CM | POA: Diagnosis not present

## 2017-06-11 DIAGNOSIS — L03115 Cellulitis of right lower limb: Secondary | ICD-10-CM | POA: Diagnosis not present

## 2017-06-11 DIAGNOSIS — L237 Allergic contact dermatitis due to plants, except food: Secondary | ICD-10-CM | POA: Diagnosis not present

## 2017-06-17 DIAGNOSIS — M5416 Radiculopathy, lumbar region: Secondary | ICD-10-CM | POA: Diagnosis not present

## 2017-06-19 DIAGNOSIS — M5126 Other intervertebral disc displacement, lumbar region: Secondary | ICD-10-CM | POA: Diagnosis not present

## 2017-06-19 DIAGNOSIS — M5416 Radiculopathy, lumbar region: Secondary | ICD-10-CM | POA: Diagnosis not present

## 2017-06-26 DIAGNOSIS — M5416 Radiculopathy, lumbar region: Secondary | ICD-10-CM | POA: Diagnosis not present

## 2017-06-26 DIAGNOSIS — I10 Essential (primary) hypertension: Secondary | ICD-10-CM | POA: Diagnosis not present

## 2017-06-26 DIAGNOSIS — Z6836 Body mass index (BMI) 36.0-36.9, adult: Secondary | ICD-10-CM | POA: Diagnosis not present

## 2017-06-27 ENCOUNTER — Other Ambulatory Visit: Payer: Self-pay | Admitting: Neurosurgery

## 2017-06-27 DIAGNOSIS — M5416 Radiculopathy, lumbar region: Secondary | ICD-10-CM

## 2017-07-03 ENCOUNTER — Ambulatory Visit
Admission: RE | Admit: 2017-07-03 | Discharge: 2017-07-03 | Disposition: A | Discharge status: Home / Self Care | Payer: Medicare Other | Source: Ambulatory Visit | Attending: Neurosurgery | Admitting: Neurosurgery

## 2017-07-03 DIAGNOSIS — M5116 Intervertebral disc disorders with radiculopathy, lumbar region: Secondary | ICD-10-CM | POA: Diagnosis not present

## 2017-07-03 DIAGNOSIS — M5416 Radiculopathy, lumbar region: Secondary | ICD-10-CM

## 2017-07-03 MED ORDER — METHYLPREDNISOLONE ACETATE 40 MG/ML INJ SUSP (RADIOLOG
120.0000 mg | Freq: Once | INTRAMUSCULAR | Status: AC
  Administered 2017-07-03: 120 mg via EPIDURAL

## 2017-07-03 MED ORDER — IOPAMIDOL (ISOVUE-M 200) INJECTION 41%
1.0000 mL | Freq: Once | INTRAMUSCULAR | Status: AC
  Administered 2017-07-03: 1 mL via EPIDURAL

## 2017-07-03 NOTE — Progress Notes (Signed)
Pt did not need valium pre procedure for this test.

## 2017-07-03 NOTE — Discharge Instructions (Signed)

## 2017-07-07 ENCOUNTER — Other Ambulatory Visit: Payer: Self-pay | Admitting: Neurosurgery

## 2017-07-07 DIAGNOSIS — M5416 Radiculopathy, lumbar region: Secondary | ICD-10-CM
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
## 2017-07-14 ENCOUNTER — Encounter (HOSPITAL_COMMUNITY): Payer: Self-pay | Admitting: Psychiatry

## 2017-07-14 ENCOUNTER — Ambulatory Visit (INDEPENDENT_AMBULATORY_CARE_PROVIDER_SITE_OTHER): Payer: Medicare Other | Admitting: Psychiatry

## 2017-07-14 VITALS — BP 146/75 | HR 70 | Ht 63.5 in | Wt 230.0 lb

## 2017-07-14 DIAGNOSIS — Z87891 Personal history of nicotine dependence: Secondary | ICD-10-CM

## 2017-07-14 DIAGNOSIS — F410 Panic disorder [episodic paroxysmal anxiety] without agoraphobia: Secondary | ICD-10-CM | POA: Diagnosis not present

## 2017-07-14 DIAGNOSIS — Z9889 Other specified postprocedural states: Secondary | ICD-10-CM

## 2017-07-14 DIAGNOSIS — F419 Anxiety disorder, unspecified: Secondary | ICD-10-CM

## 2017-07-14 DIAGNOSIS — F1020 Alcohol dependence, uncomplicated: Secondary | ICD-10-CM

## 2017-07-14 DIAGNOSIS — F0630 Mood disorder due to known physiological condition, unspecified: Secondary | ICD-10-CM

## 2017-07-14 DIAGNOSIS — G4700 Insomnia, unspecified: Secondary | ICD-10-CM

## 2017-07-14 DIAGNOSIS — Z811 Family history of alcohol abuse and dependence: Secondary | ICD-10-CM

## 2017-07-14 DIAGNOSIS — R450 Nervousness: Secondary | ICD-10-CM | POA: Diagnosis not present

## 2017-07-14 DIAGNOSIS — Z818 Family history of other mental and behavioral disorders: Secondary | ICD-10-CM

## 2017-07-14 DIAGNOSIS — Z736 Limitation of activities due to disability: Secondary | ICD-10-CM

## 2017-07-14 DIAGNOSIS — M549 Dorsalgia, unspecified: Secondary | ICD-10-CM

## 2017-07-14 MED ORDER — NALTREXONE HCL 50 MG PO TABS: 50.0000 mg | ORAL_TABLET | Freq: Every day | ORAL | 1 refills | Status: DC

## 2017-07-14 MED ORDER — FLUOXETINE HCL 40 MG PO CAPS: 80.0000 mg | ORAL_CAPSULE | Freq: Every day | ORAL | 0 refills | Status: DC

## 2017-07-14 MED ORDER — LAMOTRIGINE 150 MG PO TABS: 150.0000 mg | ORAL_TABLET | Freq: Every morning | ORAL | 0 refills | Status: DC

## 2017-07-14 NOTE — Patient Instructions (Signed)
1. Increase fluoxetine 80 mg daily  2. Continue lamotrigine 150 mg daily  3. Continue Buspar 20 mg BID 4. Continue hydroxyzine 50 mg twice a day  5. Reinitiate doxepin 100 mg at night  6. Continue naltrexone 50 mg daily  7.Return to clinic in three months for 30 mins

## 2017-07-17 ENCOUNTER — Ambulatory Visit
Admission: RE | Admit: 2017-07-17 | Discharge: 2017-07-17 | Disposition: A | Discharge status: Home / Self Care | Payer: Medicare Other | Source: Ambulatory Visit | Attending: Neurosurgery | Admitting: Neurosurgery

## 2017-07-17 ENCOUNTER — Other Ambulatory Visit: Payer: Self-pay | Admitting: Neurosurgery

## 2017-07-17 DIAGNOSIS — M5416 Radiculopathy, lumbar region: Secondary | ICD-10-CM

## 2017-07-17 DIAGNOSIS — M5136 Other intervertebral disc degeneration, lumbar region: Secondary | ICD-10-CM | POA: Diagnosis not present

## 2017-07-17 DIAGNOSIS — M47817 Spondylosis without myelopathy or radiculopathy, lumbosacral region: Secondary | ICD-10-CM | POA: Diagnosis not present

## 2017-07-17 MED ORDER — IOPAMIDOL (ISOVUE-M 200) INJECTION 41%
1.0000 mL | Freq: Once | INTRAMUSCULAR | Status: AC
  Administered 2017-07-17: 1 mL via EPIDURAL

## 2017-07-17 MED ORDER — METHYLPREDNISOLONE ACETATE 40 MG/ML INJ SUSP (RADIOLOG
120.0000 mg | Freq: Once | INTRAMUSCULAR | Status: AC
  Administered 2017-07-17: 120 mg via EPIDURAL

## 2017-08-28 ENCOUNTER — Telehealth (HOSPITAL_COMMUNITY): Payer: Self-pay

## 2017-08-28 ENCOUNTER — Other Ambulatory Visit (HOSPITAL_COMMUNITY): Payer: Self-pay | Admitting: Psychiatry

## 2017-08-28 MED ORDER — BUSPIRONE HCL 10 MG PO TABS: 20.0000 mg | ORAL_TABLET | Freq: Two times a day (BID) | ORAL | 0 refills | Status: DC

## 2017-08-28 NOTE — Telephone Encounter (Signed)
ordered

## 2017-08-28 NOTE — Telephone Encounter (Signed)
Patient called requesting a refill on Buspar 10mg .  Next appointment is scheduled for 10-14-17.

## 2017-08-29 NOTE — Telephone Encounter (Signed)
Called patient

## 2017-09-08 DIAGNOSIS — Z1211 Encounter for screening for malignant neoplasm of colon: Secondary | ICD-10-CM | POA: Diagnosis not present

## 2017-09-08 DIAGNOSIS — I10 Essential (primary) hypertension: Secondary | ICD-10-CM | POA: Diagnosis not present

## 2017-09-08 DIAGNOSIS — R251 Tremor, unspecified: Secondary | ICD-10-CM | POA: Diagnosis not present

## 2017-09-16 ENCOUNTER — Encounter: Payer: Self-pay | Admitting: Gastroenterology

## 2017-09-30 DIAGNOSIS — G251 Drug-induced tremor: Secondary | ICD-10-CM | POA: Diagnosis not present

## 2017-09-30 DIAGNOSIS — G4701 Insomnia due to medical condition: Secondary | ICD-10-CM | POA: Diagnosis not present

## 2017-09-30 DIAGNOSIS — G4733 Obstructive sleep apnea (adult) (pediatric): Secondary | ICD-10-CM | POA: Diagnosis not present

## 2017-09-30 DIAGNOSIS — G3184 Mild cognitive impairment, so stated: Secondary | ICD-10-CM | POA: Diagnosis not present

## 2017-10-02 ENCOUNTER — Other Ambulatory Visit: Payer: Self-pay | Admitting: Neurosurgery

## 2017-10-02 DIAGNOSIS — M5416 Radiculopathy, lumbar region: Secondary | ICD-10-CM
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
## 2017-10-14 ENCOUNTER — Ambulatory Visit (HOSPITAL_COMMUNITY): Payer: Self-pay | Admitting: Psychiatry

## 2017-10-14 ENCOUNTER — Telehealth (HOSPITAL_COMMUNITY): Payer: Self-pay | Admitting: Psychiatry

## 2017-10-14 ENCOUNTER — Encounter (HOSPITAL_COMMUNITY): Payer: Self-pay | Admitting: Psychiatry

## 2017-10-14 ENCOUNTER — Ambulatory Visit (INDEPENDENT_AMBULATORY_CARE_PROVIDER_SITE_OTHER): Payer: Medicare Other | Admitting: Psychiatry

## 2017-10-14 VITALS — BP 135/84 | HR 61 | Ht 63.5 in | Wt 239.0 lb

## 2017-10-14 DIAGNOSIS — F1020 Alcohol dependence, uncomplicated: Secondary | ICD-10-CM

## 2017-10-14 DIAGNOSIS — Z811 Family history of alcohol abuse and dependence: Secondary | ICD-10-CM

## 2017-10-14 DIAGNOSIS — Z818 Family history of other mental and behavioral disorders: Secondary | ICD-10-CM

## 2017-10-14 DIAGNOSIS — F0630 Mood disorder due to known physiological condition, unspecified: Secondary | ICD-10-CM

## 2017-10-14 DIAGNOSIS — Z736 Limitation of activities due to disability: Secondary | ICD-10-CM

## 2017-10-14 DIAGNOSIS — F4311 Post-traumatic stress disorder, acute: Secondary | ICD-10-CM | POA: Diagnosis not present

## 2017-10-14 DIAGNOSIS — G4700 Insomnia, unspecified: Secondary | ICD-10-CM

## 2017-10-14 DIAGNOSIS — F4310 Post-traumatic stress disorder, unspecified: Secondary | ICD-10-CM | POA: Insufficient documentation

## 2017-10-14 DIAGNOSIS — Z87891 Personal history of nicotine dependence: Secondary | ICD-10-CM

## 2017-10-14 MED ORDER — NALTREXONE HCL 50 MG PO TABS: 50.0000 mg | ORAL_TABLET | Freq: Every day | ORAL | 0 refills | Status: DC

## 2017-10-14 MED ORDER — LAMOTRIGINE 150 MG PO TABS: 150.0000 mg | ORAL_TABLET | Freq: Every morning | ORAL | 0 refills | Status: DC

## 2017-10-14 MED ORDER — FLUOXETINE HCL 20 MG PO TABS: ORAL_TABLET | ORAL | 0 refills | Status: DC

## 2017-10-14 MED ORDER — DOXEPIN HCL 25 MG PO CAPS: 25.0000 mg | ORAL_CAPSULE | Freq: Every day | ORAL | 0 refills | Status: DC

## 2017-10-14 MED ORDER — QUETIAPINE FUMARATE 25 MG PO TABS: 25.0000 mg | ORAL_TABLET | Freq: Every day | ORAL | 0 refills | Status: DC

## 2017-10-14 NOTE — Patient Instructions (Signed)
1. Decrease fluoxetine 60 mg daily (40 mg + 20 mg) 2. Decreasedoxepin 50 mg at night for one week, then 25 mg at night for one week, then discontinue 3. Continue Buspar20 mg BID 4. Continue lamotrigine 150 mg daily  5. Continue hydroxyzine50 mg twice a day 6. Start quetiapine 25 mg at night  7.Continuenaltrexone 50 mg daily  8.Return to clinic in one month for 30 mins

## 2017-10-14 NOTE — Telephone Encounter (Signed)
Received record from [facility] neurology, dated 09/30/2017. Patient was evaluated for tremors. Dx includes drug induced tremor, OSA, mild cognitive impairment. She was advised to reduce serotonergic medication and consider using benzo prn such as clonazepam. May possibly try mysoline/primidone for tremors if the other option fails. She will have sleep study to treat underlying OSA which could be causing her nocturnal myoclonus.

## 2017-10-15 ENCOUNTER — Other Ambulatory Visit (HOSPITAL_BASED_OUTPATIENT_CLINIC_OR_DEPARTMENT_OTHER): Payer: Self-pay

## 2017-10-15 DIAGNOSIS — G4733 Obstructive sleep apnea (adult) (pediatric): Secondary | ICD-10-CM

## 2017-10-16 ENCOUNTER — Other Ambulatory Visit: Payer: Self-pay

## 2017-10-16 ENCOUNTER — Telehealth (HOSPITAL_COMMUNITY): Payer: Self-pay | Admitting: *Deleted

## 2017-10-16 NOTE — Telephone Encounter (Signed)
Patient called stating that the naltrexone (DEPADE) 50 MG tablet that the provider put her on is causing withdrawal . Provider informed that she was previously on this medication & that it doesn't cause withdrawal. Then she said I've been going thru withdrawals for a week. I said can I suggest that you go the emergency room. She replied Oh okay & call ended

## 2017-10-21 ENCOUNTER — Other Ambulatory Visit: Payer: Self-pay | Admitting: Neurosurgery

## 2017-10-21 ENCOUNTER — Ambulatory Visit
Admission: RE | Admit: 2017-10-21 | Discharge: 2017-10-21 | Disposition: A | Discharge status: Home / Self Care | Payer: Medicare Other | Source: Ambulatory Visit | Attending: Neurosurgery | Admitting: Neurosurgery

## 2017-10-21 DIAGNOSIS — M5416 Radiculopathy, lumbar region: Secondary | ICD-10-CM

## 2017-10-21 DIAGNOSIS — M47817 Spondylosis without myelopathy or radiculopathy, lumbosacral region: Secondary | ICD-10-CM | POA: Diagnosis not present

## 2017-10-21 MED ORDER — IOPAMIDOL (ISOVUE-M 200) INJECTION 41%
1.0000 mL | Freq: Once | INTRAMUSCULAR | Status: AC
  Administered 2017-10-21: 1 mL via EPIDURAL

## 2017-10-21 MED ORDER — METHYLPREDNISOLONE ACETATE 40 MG/ML INJ SUSP (RADIOLOG
120.0000 mg | Freq: Once | INTRAMUSCULAR | Status: AC
  Administered 2017-10-21: 120 mg via EPIDURAL

## 2017-10-21 NOTE — Discharge Instructions (Signed)

## 2017-10-30 ENCOUNTER — Telehealth: Payer: Self-pay

## 2017-10-30 NOTE — Telephone Encounter (Signed)
Patient No Showed for [facility]. A message was left on voice mail to call and reschedule before 5:00 Pm today. If patient does not reschedule today a no show letter will be mailed and her procedure will be cancelled per [place] guidelines.  Riki Sheer, LPN ( PV )

## 2017-11-05 ENCOUNTER — Encounter: Payer: Self-pay | Admitting: Gastroenterology

## 2017-11-07 ENCOUNTER — Other Ambulatory Visit (HOSPITAL_COMMUNITY): Payer: Self-pay | Admitting: Psychiatry

## 2017-11-07 MED ORDER — QUETIAPINE FUMARATE 25 MG PO TABS: 25.0000 mg | ORAL_TABLET | Freq: Every day | ORAL | 0 refills | Status: DC

## 2017-11-07 MED ORDER — HYDROXYZINE HCL 50 MG PO TABS: 50.0000 mg | ORAL_TABLET | Freq: Two times a day (BID) | ORAL | 0 refills | Status: DC | PRN

## 2017-11-07 MED ORDER — FLUOXETINE HCL 20 MG PO TABS: ORAL_TABLET | ORAL | 0 refills | Status: DC

## 2017-11-08 ENCOUNTER — Other Ambulatory Visit (HOSPITAL_COMMUNITY): Payer: Self-pay | Admitting: Psychiatry
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
## 2017-11-13 ENCOUNTER — Encounter: Payer: Self-pay | Admitting: Gastroenterology

## 2017-11-14 ENCOUNTER — Ambulatory Visit (INDEPENDENT_AMBULATORY_CARE_PROVIDER_SITE_OTHER): Payer: Medicare Other | Admitting: Psychiatry

## 2017-11-14 ENCOUNTER — Encounter (HOSPITAL_COMMUNITY): Payer: Self-pay | Admitting: Psychiatry

## 2017-11-14 VITALS — BP 146/83 | HR 72 | Ht 63.5 in | Wt 241.0 lb

## 2017-11-14 DIAGNOSIS — G4700 Insomnia, unspecified: Secondary | ICD-10-CM

## 2017-11-14 DIAGNOSIS — Z87891 Personal history of nicotine dependence: Secondary | ICD-10-CM | POA: Diagnosis not present

## 2017-11-14 DIAGNOSIS — F4310 Post-traumatic stress disorder, unspecified: Secondary | ICD-10-CM | POA: Diagnosis not present

## 2017-11-14 DIAGNOSIS — F0630 Mood disorder due to known physiological condition, unspecified: Secondary | ICD-10-CM

## 2017-11-14 DIAGNOSIS — F419 Anxiety disorder, unspecified: Secondary | ICD-10-CM

## 2017-11-14 MED ORDER — BUSPIRONE HCL 10 MG PO TABS: 20.0000 mg | ORAL_TABLET | Freq: Two times a day (BID) | ORAL | 0 refills | Status: DC

## 2017-11-14 MED ORDER — QUETIAPINE FUMARATE 50 MG PO TABS: 50.0000 mg | ORAL_TABLET | Freq: Every day | ORAL | 0 refills | Status: DC

## 2017-11-14 MED ORDER — LAMOTRIGINE 100 MG PO TABS: 100.0000 mg | ORAL_TABLET | Freq: Every day | ORAL | 0 refills | Status: DC

## 2017-11-14 NOTE — Patient Instructions (Signed)
1. Continue fluoxetine 40 mg daily  2. Continue Buspar20 mg BID 3. Decrease lamotrigine 100 mg daily  4. Continue hydroxyzine50 mg twice a day 5. Increase quetiapine 50 mg at night  6. Return to clinic in two monthsfor 30 mins

## 2017-11-28 ENCOUNTER — Other Ambulatory Visit (HOSPITAL_COMMUNITY): Payer: Self-pay | Admitting: Physician Assistant

## 2017-11-28 DIAGNOSIS — R262 Difficulty in walking, not elsewhere classified: Secondary | ICD-10-CM | POA: Diagnosis not present

## 2017-11-28 DIAGNOSIS — R911 Solitary pulmonary nodule: Secondary | ICD-10-CM | POA: Diagnosis not present

## 2017-11-28 DIAGNOSIS — F430 Acute stress reaction: Secondary | ICD-10-CM | POA: Diagnosis not present

## 2017-11-28 DIAGNOSIS — Z1231 Encounter for screening mammogram for malignant neoplasm of breast: Secondary | ICD-10-CM

## 2017-11-28 DIAGNOSIS — Z1239 Encounter for other screening for malignant neoplasm of breast: Secondary | ICD-10-CM | POA: Diagnosis not present

## 2017-11-28 DIAGNOSIS — M5416 Radiculopathy, lumbar region: Secondary | ICD-10-CM | POA: Diagnosis not present

## 2017-12-04 ENCOUNTER — Telehealth: Payer: Self-pay

## 2017-12-04 NOTE — Telephone Encounter (Signed)
Patient No Showed for PV. I called the patient to reschedule her PV appointment and she states that she would like to cancel her scheduled colonoscopy on 12/18/17. Patient states that she has too much going on at this time and she will call back to reschedule after November.   Riki Sheer, LPN ( PV)

## 2017-12-08 ENCOUNTER — Telehealth: Payer: Self-pay | Admitting: *Deleted

## 2017-12-08 ENCOUNTER — Ambulatory Visit (AMBULATORY_SURGERY_CENTER): Payer: Self-pay | Admitting: *Deleted

## 2017-12-08 VITALS — Ht 63.0 in | Wt 242.0 lb

## 2017-12-08 DIAGNOSIS — Z86010 Personal history of colonic polyps: Secondary | ICD-10-CM

## 2017-12-08 MED ORDER — NA SULFATE-K SULFATE-MG SULF 17.5-3.13-1.6 GM/177ML PO SOLN: ORAL | 0 refills | Status: DC

## 2017-12-08 NOTE — Telephone Encounter (Signed)
Patient had PV today. Colonoscopy with Dr.Danis on 12/18/17. Patient states her last Seizure was April 2019. [facility] for [employer]? Please advise. Thank you,Tamara Mccullough pv

## 2017-12-08 NOTE — Telephone Encounter (Signed)
Robbin,  This pt is cleared for anesthetic care at [facility].   Thanks,   Loye Vento 

## 2017-12-08 NOTE — Progress Notes (Signed)
Patient denies any allergies to eggs or soy. Patient denies any problems with anesthesia/sedation. Patient denies any oxygen use at home. Patient denies taking any diet/weight loss medications or blood thinners. EMMI education offered, pt declined.  

## 2017-12-09 ENCOUNTER — Encounter: Payer: Self-pay | Admitting: Gastroenterology

## 2017-12-09 NOTE — Telephone Encounter (Signed)
Will proceed as scheduled  

## 2017-12-12 ENCOUNTER — Ambulatory Visit (HOSPITAL_COMMUNITY): Payer: Self-pay

## 2017-12-15 DIAGNOSIS — M5416 Radiculopathy, lumbar region: Secondary | ICD-10-CM | POA: Diagnosis not present

## 2017-12-15 DIAGNOSIS — M5136 Other intervertebral disc degeneration, lumbar region: Secondary | ICD-10-CM | POA: Diagnosis not present

## 2017-12-15 DIAGNOSIS — M5432 Sciatica, left side: Secondary | ICD-10-CM | POA: Diagnosis not present

## 2017-12-16 ENCOUNTER — Telehealth: Payer: Self-pay | Admitting: Gastroenterology

## 2017-12-16 NOTE — Telephone Encounter (Signed)
Spoke with patient. She asked if she can take her "fluid pill" the day of procedure. Told patient that she may take this or hold it. no further questions at this time.

## 2017-12-18 ENCOUNTER — Ambulatory Visit (AMBULATORY_SURGERY_CENTER): Payer: Medicare Other | Admitting: Gastroenterology

## 2017-12-18 ENCOUNTER — Encounter: Payer: Self-pay | Admitting: Gastroenterology

## 2017-12-18 VITALS — BP 134/60 | HR 64 | Temp 97.1°F | Resp 12 | Ht 63.0 in | Wt 242.0 lb

## 2017-12-18 DIAGNOSIS — Z86010 Personal history of colonic polyps: Secondary | ICD-10-CM

## 2017-12-18 DIAGNOSIS — D120 Benign neoplasm of cecum: Secondary | ICD-10-CM

## 2017-12-18 DIAGNOSIS — K633 Ulcer of intestine: Secondary | ICD-10-CM | POA: Diagnosis not present

## 2017-12-18 DIAGNOSIS — Z1211 Encounter for screening for malignant neoplasm of colon: Secondary | ICD-10-CM | POA: Diagnosis not present

## 2017-12-18 DIAGNOSIS — D122 Benign neoplasm of ascending colon: Secondary | ICD-10-CM | POA: Diagnosis not present

## 2017-12-18 MED ORDER — SODIUM CHLORIDE 0.9 % IV SOLN: 500.0000 mL | Freq: Once | INTRAVENOUS | Status: DC

## 2017-12-18 NOTE — Patient Instructions (Signed)
Handout given on polyps. DISCONTINUE NSAIDS NSAIDS ARE ANTI-INFLAMMATORY OTC MEDS SUCH AS IBUPROFEN, ASPIRIN, ALEVE   YOU HAD AN ENDOSCOPIC PROCEDURE TODAY AT THE [facility]:   Refer to the procedure report that was given to you for any specific questions about what was found during the examination.  If the procedure report does not answer your questions, please call your gastroenterologist to clarify.  If you requested that your care partner not be given the details of your procedure findings, then the procedure report has been included in a sealed envelope for you to review at your convenience later.  YOU SHOULD EXPECT: Some feelings of bloating in the abdomen. Passage of more gas than usual.  Walking can help get rid of the air that was put into your GI tract during the procedure and reduce the bloating. If you had a lower endoscopy (such as a colonoscopy or flexible sigmoidoscopy) you may notice spotting of blood in your stool or on the toilet paper. If you underwent a bowel prep for your procedure, you may not have a normal bowel movement for a few days.  Please Note:  You might notice some irritation and congestion in your nose or some drainage.  This is from the oxygen used during your procedure.  There is no need for concern and it should clear up in a day or so.  SYMPTOMS TO REPORT IMMEDIATELY:   Following lower endoscopy (colonoscopy or flexible sigmoidoscopy):  Excessive amounts of blood in the stool  Significant tenderness or worsening of abdominal pains  Swelling of the abdomen that is new, acute  Fever of 100F or higher   For urgent or emergent issues, a gastroenterologist can be reached at any hour by calling [phone].   DIET:  We do recommend a small meal at first, but then you may proceed to your regular diet.  Drink plenty of fluids but you should avoid alcoholic beverages for 24 hours.  ACTIVITY:  You should plan to take it easy for the rest of today  and you should NOT DRIVE or use heavy machinery until tomorrow (because of the sedation medicines used during the test).    FOLLOW UP: Our staff will call the number listed on your records the next business day following your procedure to check on you and address any questions or concerns that you may have regarding the information given to you following your procedure. If we do not reach you, we will leave a message.  However, if you are feeling well and you are not experiencing any problems, there is no need to return our call.  We will assume that you have returned to your regular daily activities without incident.  If any biopsies were taken you will be contacted by phone or by letter within the next 1-3 weeks.  Please call us at [phone] if you have not heard about the biopsies in 3 weeks.    SIGNATURES/CONFIDENTIALITY: You and/or your care partner have signed paperwork which will be entered into your electronic medical record.  These signatures attest to the fact that that the information above on your After Visit Summary has been reviewed and is understood.  Full responsibility of the confidentiality of this discharge information lies with you and/or your care-partner.

## 2017-12-18 NOTE — Op Note (Signed)
[facility] Patient Name: Tamara Mccullough Procedure Date: 12/18/2017 9:07 AM MRN: [id number] Endoscopist: Mallie Mussel L. Loletha Carrow , MD Age: 62 Referring MD:  Date of Birth: Aug 21, 1955 Gender: Female Account #: [id number] Procedure:                Colonoscopy Indications:              Surveillance: Personal history of adenomatous                            polyps on last colonoscopy > 5 years ago (<10 mm;TA                            07/2009) Medicines:                Monitored Anesthesia Care Procedure:                Pre-Anesthesia Assessment:                           - Prior to the procedure, a History and Physical                            was performed, and patient medications and                            allergies were reviewed. The patient's tolerance of                            previous anesthesia was also reviewed. The risks                            and benefits of the procedure and the sedation                            options and risks were discussed with the patient.                            All questions were answered, and informed consent                            was obtained. Prior Anticoagulants: The patient has                            taken no previous anticoagulant or antiplatelet                            agents. ASA Grade Assessment: II - A patient with                            mild systemic disease. After reviewing the risks                            and benefits, the patient was deemed in  satisfactory condition to undergo the procedure.                           After obtaining informed consent, the colonoscope                            was passed under direct vision. Throughout the                            procedure, the patient's blood pressure, pulse, and                            oxygen saturations were monitored continuously. The                            Model PCF-[id number]) scope was introduced                             through the anus and advanced to the the cecum,                            identified by appendiceal orifice and ileocecal                            valve. The colonoscopy was performed without                            difficulty. The patient tolerated the procedure                            well. The quality of the bowel preparation was                            good. The ileocecal valve, appendiceal orifice, and                            rectum were photographed. The quality of the bowel                            preparation was evaluated using the BBPS [facility]                            Bowel Preparation Scale) with scores of: Right                            Colon = 2, Transverse Colon = 2 and Left Colon = 2.                            The total BBPS score equals 6. Scope In: 9:09:40 AM Scope Out: 9:25:28 AM Scope Withdrawal Time: 0 hours 10 minutes 55 seconds  Total Procedure Duration: 0 hours 15 minutes 48 seconds  Findings:                 The perianal and  digital rectal examinations were                            normal.                           A 2 mm polyp was found in the cecum. The polyp was                            sessile. The polyp was removed with a cold biopsy                            forceps. Resection and retrieval were complete.                           A five mm clean-based, benign-appearing ulcer was                            found in the ascending colon. No bleeding was                            present. Biopsies were taken with a cold forceps                            for histology.                           The exam was otherwise without abnormality on                            direct and retroflexion views. Complications:            No immediate complications. Estimated Blood Loss:     Estimated blood loss was minimal. Impression:               - One 2 mm polyp in the cecum, removed with a cold                             biopsy forceps. Resected and retrieved.                           - A single ulcer in the ascending colon. Biopsied.                            Probably NSAID-related.                           - The examination was otherwise normal on direct                            and retroflexion views. Recommendation:           - Patient has a contact number available for                            emergencies. The signs and  symptoms of potential                            delayed complications were discussed with the                            patient. Return to normal activities tomorrow.                            Written discharge instructions were provided to the                            patient.                           - Resume previous diet.                           - Discontinue NSAIDs (i.e. ibuprofen).                           - Await pathology results.                           - Repeat colonoscopy is recommended for                            surveillance. The colonoscopy date will be                            determined after pathology results from today's                            exam become available for review. Henry L. Loletha Carrow, MD 12/18/2017 9:30:36 AM This report has been signed electronically.

## 2017-12-18 NOTE — Progress Notes (Signed)
Pt's states no medical or surgical changes since previsit or office visit. 

## 2017-12-18 NOTE — Progress Notes (Signed)
A and O x3. Report to RN. Tolerated MAC anesthesia well.

## 2017-12-18 NOTE — Progress Notes (Signed)
Last seizure was in June, only have seizures when drinking a lot of alcohol or using Xanax.

## 2017-12-18 NOTE — Progress Notes (Signed)
Called to room to assist during endoscopic procedure.  Patient ID and intended procedure confirmed with present staff. Received instructions for my participation in the procedure from the performing physician.  

## 2017-12-18 NOTE — Progress Notes (Signed)
HUGE HEMATOMA THE SIZE OF A WALNUT NOTED ON TOP OF LEFT HAND.  A WILLIS REMOVED IV FROM THIS SIZE AND HELD PRESSURE 10 SECONDS.  WARM COMPRESS AND EVEN MODERATE PRESSURE APPLIED.  FLATTENED OUT .  VERY BRUISED.

## 2017-12-19 ENCOUNTER — Telehealth: Payer: Self-pay

## 2017-12-19 NOTE — Telephone Encounter (Signed)
  Follow up Call-  Call back number 12/18/2017  Post procedure Call Back phone  # [phone]  Permission to leave phone message Yes  Some recent data might be hidden     Patient questions:2  Do you have a fever, pain , or abdominal swelling? No. Pain Score  0 *  Have you tolerated food without any problems? Yes.    Have you been able to return to your normal activities? Yes.    Do you have any questions about your discharge instructions: Diet   No. Medications  No. Follow up visit  No.  Do you have questions or concerns about your Care? No.  Actions: * If pain score is 4 or above: No action needed, pain <4.

## 2017-12-19 NOTE — Telephone Encounter (Signed)
Left message

## 2017-12-24 ENCOUNTER — Ambulatory Visit
Admission: RE | Admit: 2017-12-24 | Discharge: 2017-12-24 | Disposition: A | Discharge status: Home / Self Care | Payer: Medicare Other | Source: Ambulatory Visit | Attending: Physician Assistant | Admitting: Physician Assistant

## 2017-12-24 ENCOUNTER — Encounter: Payer: Self-pay | Admitting: Gastroenterology

## 2017-12-24 ENCOUNTER — Other Ambulatory Visit: Payer: Self-pay | Admitting: Physician Assistant

## 2017-12-24 DIAGNOSIS — R911 Solitary pulmonary nodule: Secondary | ICD-10-CM | POA: Diagnosis not present

## 2017-12-24 DIAGNOSIS — M5416 Radiculopathy, lumbar region: Secondary | ICD-10-CM | POA: Diagnosis not present

## 2018-01-05 DIAGNOSIS — M4807 Spinal stenosis, lumbosacral region: Secondary | ICD-10-CM | POA: Diagnosis not present

## 2018-01-05 DIAGNOSIS — M5127 Other intervertebral disc displacement, lumbosacral region: Secondary | ICD-10-CM | POA: Diagnosis not present

## 2018-01-05 DIAGNOSIS — M47817 Spondylosis without myelopathy or radiculopathy, lumbosacral region: Secondary | ICD-10-CM | POA: Diagnosis not present
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
## 2018-01-12 ENCOUNTER — Ambulatory Visit (HOSPITAL_COMMUNITY): Payer: Medicare Other | Admitting: Psychiatry

## 2018-01-22 ENCOUNTER — Ambulatory Visit (HOSPITAL_COMMUNITY): Payer: Self-pay

## 2018-02-06 ENCOUNTER — Other Ambulatory Visit: Payer: Self-pay | Admitting: Physician Assistant

## 2018-02-06 DIAGNOSIS — R911 Solitary pulmonary nodule: Secondary | ICD-10-CM

## 2018-02-09 ENCOUNTER — Other Ambulatory Visit (HOSPITAL_COMMUNITY): Payer: Self-pay | Admitting: Psychiatry

## 2018-02-09 MED ORDER — HYDROXYZINE HCL 50 MG PO TABS: 50.0000 mg | ORAL_TABLET | Freq: Two times a day (BID) | ORAL | 0 refills | Status: DC | PRN

## 2018-02-09 MED ORDER — QUETIAPINE FUMARATE 50 MG PO TABS: 50.0000 mg | ORAL_TABLET | Freq: Every day | ORAL | 0 refills | Status: DC

## 2018-02-09 MED ORDER — LAMOTRIGINE 100 MG PO TABS: 100.0000 mg | ORAL_TABLET | Freq: Every day | ORAL | 0 refills | Status: DC

## 2018-02-09 MED ORDER — BUSPIRONE HCL 10 MG PO TABS: 20.0000 mg | ORAL_TABLET | Freq: Two times a day (BID) | ORAL | 0 refills | Status: DC

## 2018-02-11 ENCOUNTER — Ambulatory Visit
Admission: RE | Admit: 2018-02-11 | Discharge: 2018-02-11 | Disposition: A | Discharge status: Home / Self Care | Payer: Medicare Other | Source: Ambulatory Visit | Attending: Physician Assistant | Admitting: Physician Assistant

## 2018-02-11 DIAGNOSIS — R911 Solitary pulmonary nodule: Secondary | ICD-10-CM

## 2018-02-12 ENCOUNTER — Other Ambulatory Visit: Payer: Self-pay
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
## 2018-02-26 ENCOUNTER — Other Ambulatory Visit (HOSPITAL_COMMUNITY): Payer: Self-pay | Admitting: Psychiatry

## 2018-02-26 ENCOUNTER — Ambulatory Visit (HOSPITAL_COMMUNITY): Payer: Medicare Other | Admitting: Psychiatry

## 2018-02-26 ENCOUNTER — Encounter (HOSPITAL_COMMUNITY): Payer: Self-pay | Admitting: Psychiatry

## 2018-02-26 VITALS — BP 129/77 | HR 84 | Ht 63.0 in | Wt 238.0 lb

## 2018-02-26 DIAGNOSIS — F4310 Post-traumatic stress disorder, unspecified: Secondary | ICD-10-CM

## 2018-02-26 DIAGNOSIS — F1020 Alcohol dependence, uncomplicated: Secondary | ICD-10-CM | POA: Diagnosis not present

## 2018-02-26 NOTE — Patient Instructions (Signed)
1. Continuefluoxetine 40 mg daily  2.Continue Buspar20 mg BID 3.Continue lamotrigine 100 mg daily 4.Continue hydroxyzine50 mg twice a day 5. Increase quetiapine 100 mg at night  6. Return to clinic in one month for 30 mins - Contact [facility] for therapy

## 2018-03-06 ENCOUNTER — Other Ambulatory Visit (HOSPITAL_COMMUNITY): Payer: Self-pay | Admitting: Psychiatry

## 2018-03-06 MED ORDER — QUETIAPINE FUMARATE 100 MG PO TABS: 100.0000 mg | ORAL_TABLET | Freq: Every day | ORAL | 0 refills | Status: DC
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
## 2018-03-30 ENCOUNTER — Ambulatory Visit (HOSPITAL_COMMUNITY): Payer: Medicare Other | Admitting: Psychiatry
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
## 2018-04-13 ENCOUNTER — Ambulatory Visit (HOSPITAL_COMMUNITY): Payer: Medicare Other | Admitting: Psychiatry

## 2018-04-15 ENCOUNTER — Other Ambulatory Visit (HOSPITAL_COMMUNITY): Payer: Self-pay | Admitting: Psychiatry
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
## 2018-04-27 ENCOUNTER — Other Ambulatory Visit (HOSPITAL_COMMUNITY): Payer: Self-pay | Admitting: Psychiatry

## 2018-04-27 ENCOUNTER — Encounter (HOSPITAL_COMMUNITY): Payer: Self-pay | Admitting: Psychiatry

## 2018-04-27 ENCOUNTER — Ambulatory Visit (INDEPENDENT_AMBULATORY_CARE_PROVIDER_SITE_OTHER): Payer: Medicare Other | Admitting: Psychiatry

## 2018-04-27 ENCOUNTER — Telehealth (HOSPITAL_COMMUNITY): Payer: Self-pay | Admitting: Psychiatry

## 2018-04-27 DIAGNOSIS — Z79899 Other long term (current) drug therapy: Secondary | ICD-10-CM | POA: Diagnosis not present

## 2018-04-27 DIAGNOSIS — F1020 Alcohol dependence, uncomplicated: Secondary | ICD-10-CM | POA: Diagnosis not present

## 2018-04-27 MED ORDER — FLUOXETINE HCL 40 MG PO CAPS: 40.0000 mg | ORAL_CAPSULE | Freq: Every day | ORAL | 0 refills | Status: DC

## 2018-04-27 MED ORDER — QUETIAPINE FUMARATE 100 MG PO TABS: 100.0000 mg | ORAL_TABLET | Freq: Every day | ORAL | 0 refills | Status: DC

## 2018-04-27 MED ORDER — HYDROXYZINE HCL 50 MG PO TABS: 50.0000 mg | ORAL_TABLET | Freq: Two times a day (BID) | ORAL | 0 refills | Status: DC | PRN

## 2018-04-27 MED ORDER — NALTREXONE 380 MG IM SUSR: 380.0000 mg | INTRAMUSCULAR | 2 refills | Status: DC

## 2018-04-27 MED ORDER — BUSPIRONE HCL 10 MG PO TABS: 20.0000 mg | ORAL_TABLET | Freq: Two times a day (BID) | ORAL | 0 refills | Status: DC

## 2018-04-27 NOTE — Telephone Encounter (Signed)
Received record from [facility]. 3/3-3/11.  Limited information in discharge summary.  diagnosis includes alcohol and benzodiazepine detox.  TSH 6.1 on 3/5, vivitrol 380 mg injection received 04/15/2018. Continue for q4weeks  Medication- fluoxetine 40 mg daily, lamotrigine 150 mg daily, gabapentin 300 mg TID, buspirone 20 mg BID, hydrochlorothiazide 12.5 mg daily

## 2018-04-28 ENCOUNTER — Other Ambulatory Visit: Payer: Self-pay

## 2018-05-02 ENCOUNTER — Other Ambulatory Visit (HOSPITAL_COMMUNITY): Payer: Self-pay | Admitting: Psychiatry

## 2018-05-04 ENCOUNTER — Telehealth (HOSPITAL_COMMUNITY): Payer: Self-pay | Admitting: *Deleted

## 2018-05-04 NOTE — Telephone Encounter (Signed)
[facility]:  NOTICE OF APPROVAL                EFFECTIVE: 05/01/2018       05/01/2019

## 2018-05-06 ENCOUNTER — Encounter: Payer: Self-pay | Admitting: Psychiatry

## 2018-05-12 ENCOUNTER — Other Ambulatory Visit: Payer: Self-pay

## 2018-05-12 ENCOUNTER — Ambulatory Visit (INDEPENDENT_AMBULATORY_CARE_PROVIDER_SITE_OTHER): Payer: Medicare Other | Admitting: *Deleted

## 2018-05-12 ENCOUNTER — Encounter (HOSPITAL_COMMUNITY): Payer: Self-pay | Admitting: Psychiatry

## 2018-05-12 VITALS — BP 114/78 | HR 69 | Ht 63.0 in | Wt 238.0 lb

## 2018-05-12 DIAGNOSIS — F1020 Alcohol dependence, uncomplicated: Secondary | ICD-10-CM | POA: Diagnosis not present

## 2018-05-12 MED ORDER — NALTREXONE 380 MG IM SUSR
380.0000 mg | INTRAMUSCULAR | Status: DC
  Administered 2018-05-12: 380 mg via INTRAMUSCULAR
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
## 2018-05-12 NOTE — Progress Notes (Signed)
Patient came in today for her injection. Asked patient has she been drinking or have had anything to drink & she relied NO. Patient tolerated injection well in her R-gluteus. Will call back to schedule next injection.

## 2018-05-13 ENCOUNTER — Ambulatory Visit (HOSPITAL_COMMUNITY): Payer: Medicare Other | Admitting: Psychiatry

## 2018-05-19 ENCOUNTER — Other Ambulatory Visit: Payer: Self-pay

## 2018-05-19 ENCOUNTER — Ambulatory Visit (INDEPENDENT_AMBULATORY_CARE_PROVIDER_SITE_OTHER): Payer: Medicare Other | Admitting: Psychiatry

## 2018-05-19 ENCOUNTER — Encounter (HOSPITAL_COMMUNITY): Payer: Self-pay | Admitting: Psychiatry

## 2018-05-19 DIAGNOSIS — F4310 Post-traumatic stress disorder, unspecified: Secondary | ICD-10-CM | POA: Diagnosis not present

## 2018-05-19 DIAGNOSIS — F1020 Alcohol dependence, uncomplicated: Secondary | ICD-10-CM | POA: Diagnosis not present

## 2018-05-19 DIAGNOSIS — F0630 Mood disorder due to known physiological condition, unspecified: Secondary | ICD-10-CM | POA: Diagnosis not present

## 2018-05-20 ENCOUNTER — Telehealth (HOSPITAL_COMMUNITY): Payer: Self-pay | Admitting: Psychology
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
## 2018-06-16 ENCOUNTER — Ambulatory Visit (INDEPENDENT_AMBULATORY_CARE_PROVIDER_SITE_OTHER): Payer: Medicare Other | Admitting: Psychiatry

## 2018-06-16 ENCOUNTER — Encounter (HOSPITAL_COMMUNITY): Payer: Self-pay | Admitting: Psychiatry

## 2018-06-16 ENCOUNTER — Other Ambulatory Visit: Payer: Self-pay

## 2018-06-16 DIAGNOSIS — F4310 Post-traumatic stress disorder, unspecified: Secondary | ICD-10-CM

## 2018-06-16 DIAGNOSIS — F0630 Mood disorder due to known physiological condition, unspecified: Secondary | ICD-10-CM

## 2018-06-16 DIAGNOSIS — F1020 Alcohol dependence, uncomplicated: Secondary | ICD-10-CM

## 2018-06-16 MED ORDER — LAMOTRIGINE 100 MG PO TABS: 150.0000 mg | ORAL_TABLET | Freq: Every day | ORAL | 0 refills | Status: DC

## 2018-06-16 MED ORDER — FLUOXETINE HCL 40 MG PO CAPS: 40.0000 mg | ORAL_CAPSULE | Freq: Every day | ORAL | 0 refills | Status: DC

## 2018-06-16 MED ORDER — QUETIAPINE FUMARATE 100 MG PO TABS: 100.0000 mg | ORAL_TABLET | Freq: Every day | ORAL | 0 refills | Status: DC

## 2018-06-16 MED ORDER — HYDROXYZINE HCL 50 MG PO TABS: 50.0000 mg | ORAL_TABLET | Freq: Two times a day (BID) | ORAL | 0 refills | Status: DC | PRN

## 2018-06-16 MED ORDER — BUSPIRONE HCL 10 MG PO TABS: 20.0000 mg | ORAL_TABLET | Freq: Two times a day (BID) | ORAL | 0 refills | Status: DC

## 2018-06-17 ENCOUNTER — Telehealth (HOSPITAL_COMMUNITY): Payer: Self-pay | Admitting: Psychology
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
## 2018-08-06 ENCOUNTER — Encounter (HOSPITAL_COMMUNITY): Payer: Self-pay | Admitting: Psychiatry

## 2018-08-06 ENCOUNTER — Ambulatory Visit (INDEPENDENT_AMBULATORY_CARE_PROVIDER_SITE_OTHER): Payer: Medicare Other | Admitting: Psychiatry

## 2018-08-06 ENCOUNTER — Other Ambulatory Visit: Payer: Self-pay

## 2018-08-06 DIAGNOSIS — F0630 Mood disorder due to known physiological condition, unspecified: Secondary | ICD-10-CM

## 2018-08-06 DIAGNOSIS — F1020 Alcohol dependence, uncomplicated: Secondary | ICD-10-CM | POA: Diagnosis not present

## 2018-08-06 DIAGNOSIS — F4310 Post-traumatic stress disorder, unspecified: Secondary | ICD-10-CM | POA: Diagnosis not present

## 2018-08-06 MED ORDER — LAMOTRIGINE 100 MG PO TABS: 150.0000 mg | ORAL_TABLET | Freq: Every day | ORAL | 0 refills | Status: DC

## 2018-08-06 MED ORDER — HYDROXYZINE HCL 50 MG PO TABS: 50.0000 mg | ORAL_TABLET | Freq: Three times a day (TID) | ORAL | 0 refills | Status: DC | PRN

## 2018-08-06 MED ORDER — QUETIAPINE FUMARATE 100 MG PO TABS: 100.0000 mg | ORAL_TABLET | Freq: Every day | ORAL | 0 refills | Status: DC

## 2018-08-06 MED ORDER — BUSPIRONE HCL 10 MG PO TABS: 20.0000 mg | ORAL_TABLET | Freq: Two times a day (BID) | ORAL | 0 refills | Status: DC

## 2018-08-06 MED ORDER — FLUOXETINE HCL 40 MG PO CAPS: 40.0000 mg | ORAL_CAPSULE | Freq: Every day | ORAL | 0 refills | Status: DC

## 2018-08-06 NOTE — Patient Instructions (Signed)
1.Continuefluoxetine40 mg daily 2.Continue Buspar20 mg BID 3.Continuequetiapine100mg  at night 4.Continuelamotrigine 100 mg daily  5. Increase hydroxyzine50 mg three times a day 6. Please contact [facility] ([facility] dependency intensive outpatient program) for appointment 7. Next appointment on 8/20 at 11:30

## 2018-08-24 ENCOUNTER — Other Ambulatory Visit: Payer: Self-pay | Admitting: Physician Assistant

## 2018-08-24 DIAGNOSIS — R911 Solitary pulmonary nodule: Secondary | ICD-10-CM

## 2018-09-02 ENCOUNTER — Other Ambulatory Visit: Payer: Medicare Other

## 2018-09-15 ENCOUNTER — Other Ambulatory Visit: Payer: Medicare Other
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
## 2018-09-23 ENCOUNTER — Encounter (HOSPITAL_COMMUNITY): Payer: Self-pay | Admitting: Psychiatry

## 2018-09-23 ENCOUNTER — Ambulatory Visit (INDEPENDENT_AMBULATORY_CARE_PROVIDER_SITE_OTHER): Payer: Medicare Other | Admitting: Psychiatry

## 2018-09-23 ENCOUNTER — Other Ambulatory Visit: Payer: Self-pay

## 2018-09-23 DIAGNOSIS — F1020 Alcohol dependence, uncomplicated: Secondary | ICD-10-CM

## 2018-09-23 DIAGNOSIS — F4310 Post-traumatic stress disorder, unspecified: Secondary | ICD-10-CM | POA: Diagnosis not present

## 2018-09-23 DIAGNOSIS — F0630 Mood disorder due to known physiological condition, unspecified: Secondary | ICD-10-CM

## 2018-09-23 NOTE — Patient Instructions (Signed)
1.Continuefluoxetine40 mg daily 2.Continue Buspar20 mg BID 3.Continuequetiapine100mg  at night 4.Continuelamotrigine 100 mg daily 5. Continue hydroxyzine50 mg three times a day 6. Next appointment on10/20 at 9 AM

## 2018-10-27 ENCOUNTER — Other Ambulatory Visit (HOSPITAL_COMMUNITY): Payer: Self-pay | Admitting: Psychiatry

## 2018-10-27 MED ORDER — LAMOTRIGINE 100 MG PO TABS: 100.0000 mg | ORAL_TABLET | Freq: Every day | ORAL | 0 refills | Status: DC

## 2018-11-09 ENCOUNTER — Encounter (HOSPITAL_COMMUNITY): Payer: Self-pay | Admitting: Emergency Medicine

## 2018-11-09 ENCOUNTER — Emergency Department (HOSPITAL_COMMUNITY)
Admission: EM | Admit: 2018-11-09 | Discharge: 2018-11-09 | Disposition: A | Discharge status: Home / Self Care | Payer: Medicare Other | Attending: Emergency Medicine | Admitting: Emergency Medicine

## 2018-11-09 ENCOUNTER — Emergency Department (HOSPITAL_COMMUNITY): Payer: Medicare Other

## 2018-11-09 ENCOUNTER — Other Ambulatory Visit: Payer: Self-pay

## 2018-11-09 DIAGNOSIS — I10 Essential (primary) hypertension: Secondary | ICD-10-CM | POA: Insufficient documentation

## 2018-11-09 DIAGNOSIS — Z79899 Other long term (current) drug therapy: Secondary | ICD-10-CM | POA: Insufficient documentation

## 2018-11-09 DIAGNOSIS — Z87891 Personal history of nicotine dependence: Secondary | ICD-10-CM | POA: Diagnosis not present

## 2018-11-09 DIAGNOSIS — J45909 Unspecified asthma, uncomplicated: Secondary | ICD-10-CM | POA: Diagnosis not present

## 2018-11-09 DIAGNOSIS — U071 COVID-19: Secondary | ICD-10-CM | POA: Insufficient documentation

## 2018-11-09 DIAGNOSIS — Z7982 Long term (current) use of aspirin: Secondary | ICD-10-CM | POA: Diagnosis not present

## 2018-11-09 DIAGNOSIS — R05 Cough: Secondary | ICD-10-CM | POA: Diagnosis present

## 2018-11-09 LAB — CBC WITH DIFFERENTIAL/PLATELET
Abs Immature Granulocytes: 0.08 10*3/uL — ABNORMAL HIGH (ref 0.00–0.07)
Basophils Absolute: 0 10*3/uL (ref 0.0–0.1)
Basophils Relative: 1 %
Eosinophils Absolute: 0 10*3/uL (ref 0.0–0.5)
Eosinophils Relative: 0 %
HCT: 45 % (ref 36.0–46.0)
Hemoglobin: 14.4 g/dL (ref 12.0–15.0)
Immature Granulocytes: 1 %
Lymphocytes Relative: 13 %
Lymphs Abs: 0.9 10*3/uL (ref 0.7–4.0)
MCH: 28.9 pg (ref 26.0–34.0)
MCHC: 32 g/dL (ref 30.0–36.0)
MCV: 90.2 fL (ref 80.0–100.0)
Monocytes Absolute: 0.5 10*3/uL (ref 0.1–1.0)
Monocytes Relative: 8 %
Neutro Abs: 5.3 10*3/uL (ref 1.7–7.7)
Neutrophils Relative %: 77 %
Platelets: 180 10*3/uL (ref 150–400)
RBC: 4.99 MIL/uL (ref 3.87–5.11)
RDW: 16.2 % — ABNORMAL HIGH (ref 11.5–15.5)
WBC: 6.8 10*3/uL (ref 4.0–10.5)
nRBC: 0 % (ref 0.0–0.2)

## 2018-11-09 LAB — COMPREHENSIVE METABOLIC PANEL
ALT: 23 U/L (ref 0–44)
AST: 21 U/L (ref 15–41)
Albumin: 3.6 g/dL (ref 3.5–5.0)
Alkaline Phosphatase: 111 U/L (ref 38–126)
Anion gap: 17 — ABNORMAL HIGH (ref 5–15)
BUN: 15 mg/dL (ref 8–23)
CO2: 22 mmol/L (ref 22–32)
Calcium: 7.6 mg/dL — ABNORMAL LOW (ref 8.9–10.3)
Chloride: 102 mmol/L (ref 98–111)
Creatinine, Ser: 0.63 mg/dL (ref 0.44–1.00)
GFR calc Af Amer: >60 mL/min (ref >60)
GFR calc non Af Amer: >60 mL/min (ref >60)
Glucose, Bld: 118 mg/dL — ABNORMAL HIGH (ref 70–99)
Potassium: 3.3 mmol/L — ABNORMAL LOW (ref 3.5–5.1)
Sodium: 141 mmol/L (ref 135–145)
Total Bilirubin: 0.8 mg/dL (ref 0.3–1.2)
Total Protein: 7 g/dL (ref 6.5–8.1)

## 2018-11-09 MED ORDER — DEXAMETHASONE SODIUM PHOSPHATE 10 MG/ML IJ SOLN
10.0000 mg | Freq: Once | INTRAMUSCULAR | Status: AC
  Administered 2018-11-09: 10 mg via INTRAVENOUS
  Filled 2018-11-09: qty 1

## 2018-11-09 MED ORDER — ONDANSETRON HCL 4 MG/2ML IJ SOLN
INTRAMUSCULAR | Status: AC
  Administered 2018-11-09: 4 mg
  Filled 2018-11-09: qty 2

## 2018-11-09 MED ORDER — ALBUTEROL SULFATE HFA 108 (90 BASE) MCG/ACT IN AERS
6.0000 | INHALATION_SPRAY | RESPIRATORY_TRACT | Status: DC | PRN
  Administered 2018-11-09: 6 via RESPIRATORY_TRACT
  Filled 2018-11-09: qty 6.7

## 2018-11-09 MED ORDER — ALBUTEROL SULFATE HFA 108 (90 BASE) MCG/ACT IN AERS: 2.0000 | INHALATION_SPRAY | RESPIRATORY_TRACT | 0 refills | Status: DC | PRN

## 2018-11-09 NOTE — ED Notes (Addendum)
Signature pad will not pull up in pt room. Pt verbalized understanding of DC instructions. Pt states she will sit in her room until her brother picks her up to avoid exposing ppl in waiting room. Offered pt wheelchair when tech becomes available to push pt out, pt declined.

## 2018-11-09 NOTE — Discharge Instructions (Addendum)
Drink plenty of fluids take Tylenol for pain or aches.  Use your inhaler for shortness of breath and follow-up with your doctor if any problem

## 2018-11-09 NOTE — ED Notes (Signed)
Pt.  Ambulated around room. Pt. o2 was 98 during and after ambulation.

## 2018-11-09 NOTE — ED Triage Notes (Signed)
Pt states she was dx with covid on 9/29. Has not been feeling well since and has body aches, nausea, cough, and dry heaves. States she thought she should be feeling better by now.

## 2018-11-09 NOTE — ED Notes (Signed)
PT refused to call family member to pick her up. States she will not risk exposing her daughter and grandchildren. States she does not have a ride home at this time and wants [facility] staff to call her a taxi or have [facility] to take her home. Margaret called [employer] to ask [employer] to take pt home. Waiting to hear back from [facility].

## 2018-11-09 NOTE — ED Provider Notes (Signed)
[facility] EMERGENCY DEPARTMENT Provider Note   CSN: [id number] Arrival date & time: 11/09/18  [id number]     History   Chief Complaint Chief Complaint  Patient presents with   Cough    covid positive    HPI Tamara Mccullough is a 63 y.o. female.     Patient complains of shortness of breath.  Patient has COVID 19,    The history is provided by the patient. No language interpreter was used.  Cough Cough characteristics:  Non-productive Sputum characteristics:  Nondescript Severity:  Moderate Onset quality:  Sudden Timing:  Constant Progression:  Worsening Chronicity:  New Smoker: no   Context: not animal exposure   Relieved by:  Nothing Worsened by:  Nothing Associated symptoms: shortness of breath   Associated symptoms: no chest pain, no eye discharge, no headaches and no rash     Past Medical History:  Diagnosis Date   Allergy    Anemia    Arthritis    back    Asthma    slight   Chronic back pain    Depression    Depression    Edema    Hyperlipidemia    Hypertension    Insomnia    Morbid obesity ([place])    Neuromuscular disorder ([place])    Overdose of sleeping tabs 03/10/2013   Panic attacks    PONV (postoperative nausea and vomiting)    Restless leg syndrome    Seizures (H[facility])    hx of seizure 05/2013; last seizure April 2019   Sleep apnea    has not used CPAP in 3 years- not using CPAP   Thyroid disease     Patient Active Problem List   Diagnosis Date Noted   Acute posttraumatic stress disorder 10/14/2017   Seizure ([place]) 12/13/2016   Lumbar disc disorder 11/07/2016   Gastric bypass status for obesity 08/22/2016   Seizures ([place]) 09/01/2015   Lumbar foraminal stenosis 07/17/2015   OA (osteoarthritis) of knee 07/17/2015   Alcohol use disorder, severe, dependence ([place])    Insomnia due to anxiety and fear 12/20/2013   Cervical stenosis of spinal canal 11/09/2013   RLS (restless legs syndrome) 08/09/2013   Multinodular  goiter 06/09/2013   Alcoholism in remission ([place]) 03/15/2013   Essential hypertension, benign 05/20/2012   GAD (generalized anxiety disorder) 05/20/2012   Hyperlipidemia with target LDL less than 100 05/20/2012   Mood disorder in conditions classified elsewhere 05/20/2012   Anemia, vitamin B12 deficiency 07/06/2009   Obesity 06/29/2009   PERSONAL HX COLONIC POLYPS 06/29/2009    Past Surgical History:  Procedure Laterality Date   ANTERIOR CERVICAL DECOMP/DISCECTOMY FUSION N/A 11/09/2013   Procedure:  Anterior cervical decompression/diskectomy/fusion cervical four-five ,cervical five-six,cervical six-seven;  Surgeon: Floyce Stakes, MD;  Location: [facility];  Service: Neurosurgery;  Laterality: N/A;   BACK SURGERY     COLONOSCOPY  2011   CTR     gastric by pass  6/11   MINOR HEMORRHOIDECTOMY     SPINE SURGERY     THYROID LOBECTOMY Right 08/12/2013   Procedure: RIGHT THYROID LOBECTOMY;  Surgeon: Odis Hollingshead, MD;  Location: [facility];  Service: General;  Laterality: Right;   TUBAL LIGATION       OB History   No obstetric history on file.      Home Medications    Prior to Admission medications   Medication Sig Start Date End Date Taking? Authorizing Provider  acetaminophen (TYLENOL) 500 MG tablet Take 1,000 mg by  mouth every 6 (six) hours as needed.   Yes [provider]  ALPRAZolam (XANAX) 0.25 MG tablet Take 0.5-1 tablets by mouth at bedtime as needed. 08/11/18  Yes [provider]  aspirin EC 81 MG tablet Take 81 mg by mouth daily.   Yes [provider]  busPIRone (BUSPAR) 10 MG tablet Take 2 tablets (20 mg total) by mouth 2 (two) times daily. 09/16/18  Yes Norman Clay, MD  diclofenac sodium (VOLTAREN) 1 % GEL Apply 4 g topically 4 (four) times daily. 03/24/17  Yes Raylene Everts, MD  FLUoxetine (PROZAC) 40 MG capsule Take 1 capsule (40 mg total) by mouth daily. 09/16/18  Yes Hisada, Elie Goody, MD  hydrochlorothiazide (MICROZIDE)  12.5 MG capsule Take 1 capsule (12.5 mg total) by mouth daily. 08/22/16  Yes Raylene Everts, MD  hydrOXYzine (ATARAX/VISTARIL) 50 MG tablet Take 1 tablet (50 mg total) by mouth 3 (three) times daily as needed. 08/06/18  Yes Hisada, Elie Goody, MD  ibuprofen (ADVIL,MOTRIN) 200 MG tablet Take 2 tablets by mouth 2 (two) times daily.   Yes [provider]  ketoconazole (NIZORAL) 2 % cream Apply 1 application topically daily. Patient taking differently: Apply 1 application topically daily as needed.  03/24/17  Yes Raylene Everts, MD  lamoTRIgine (LAMICTAL) 100 MG tablet Take 1 tablet (100 mg total) by mouth daily. 11/06/18  Yes Hisada, Elie Goody, MD  nystatin cream (MYCOSTATIN) APPLY TO AFFECTED AREA TWICE A DAY Patient taking differently: Apply 1 application topically 2 (two) times daily as needed. APPLY TO AFFECTED AREA TWICE A DAY 01/14/17  Yes Raylene Everts, MD  predniSONE (DELTASONE) 20 MG tablet Take 20-60 mg by mouth as directed. Take 60mg  by mouth daily for three days, then take 40mg  by mouth daily for three days, then take 20mg  by mouth for three days, then stop 11/02/18  Yes [provider]  albuterol (VENTOLIN HFA) 108 (90 Base) MCG/ACT inhaler Inhale 2 puffs into the lungs every 4 (four) hours as needed for wheezing or shortness of breath. 11/09/18   Milton Ferguson, MD  QUEtiapine (SEROQUEL) 100 MG tablet Take 1 tablet (100 mg total) by mouth at bedtime. 09/16/18   Norman Clay, MD    Family History Family History  Problem Relation Age of Onset   Congestive Heart Failure Mother    Diabetes Mother        died at 91   Cancer Mother        cervical   Arthritis Mother    Asthma Mother    Depression Mother    Heart disease Mother    Hypertension Mother    Congestive Heart Failure Father    Emphysema Father    Alcohol abuse Father    Diabetes Father    Arthritis Father    COPD Father    Depression Father    Heart disease Father        heart attack  died at 72   Hypertension Father    Stroke Father    Suicidality Sister    Colon polyps Sister    Breast cancer Sister    Alcohol abuse Brother    Alcohol abuse Paternal Grandfather    Alcohol abuse Brother    Alcohol abuse Brother    Colon cancer Neg Hx    Esophageal cancer Neg Hx    Stomach cancer Neg Hx    Rectal cancer Neg Hx     Social History Social History   Tobacco Use   Smoking  status: Former Smoker    Quit date: 02/05/1996    Years since quitting: 22.7   Smokeless tobacco: Never Used  Substance Use Topics   Alcohol use: Not Currently    Frequency: Never    Comment: heavy liquor for the last year    Drug use: Not Currently    Types: Benzodiazepines     Allergies   Codeine, Ambien [zolpidem tartrate], Eszopiclone, Gabapentin, Lansoprazole, Mirapex [pramipexole], Motrin [ibuprofen], Prilosec [omeprazole], Trazodone, and Trazodone and nefazodone   Review of Systems Review of Systems  Constitutional: Negative for appetite change and fatigue.  HENT: Negative for congestion, ear discharge and sinus pressure.   Eyes: Negative for discharge.  Respiratory: Positive for cough and shortness of breath.   Cardiovascular: Negative for chest pain.  Gastrointestinal: Negative for abdominal pain and diarrhea.  Genitourinary: Negative for frequency and hematuria.  Musculoskeletal: Negative for back pain.  Skin: Negative for rash.  Neurological: Negative for seizures and headaches.  Psychiatric/Behavioral: Negative for hallucinations.     Physical Exam Updated Vital Signs BP (!) 150/92    Pulse 70    Temp 98.1 F (36.7 C) (Oral)    Resp (!) 28    Ht 5\' 3"  (1.6 m)    Wt 99.8 kg    SpO2 97%    BMI 38.97 kg/m   Physical Exam Vitals signs and nursing note reviewed.  Constitutional:      Appearance: She is well-developed.  HENT:     Head: Normocephalic.     Nose: Nose normal.  Eyes:     General: No scleral icterus.    Conjunctiva/sclera:  Conjunctivae normal.  Neck:     Musculoskeletal: Neck supple.     Thyroid: No thyromegaly.  Cardiovascular:     Rate and Rhythm: Normal rate and regular rhythm.     Heart sounds: No murmur. No friction rub. No gallop.   Pulmonary:     Breath sounds: No stridor. No wheezing or rales.  Chest:     Chest wall: No tenderness.  Abdominal:     General: There is no distension.     Tenderness: There is no abdominal tenderness. There is no rebound.  Musculoskeletal: Normal range of motion.  Lymphadenopathy:     Cervical: No cervical adenopathy.  Skin:    Findings: No erythema or rash.  Neurological:     Mental Status: She is oriented to person, place, and time.     Motor: No abnormal muscle tone.     Coordination: Coordination normal.  Psychiatric:        Behavior: Behavior normal.      ED Treatments / Results  Labs (all labs ordered are listed, but only abnormal results are displayed) Labs Reviewed  CBC WITH DIFFERENTIAL/PLATELET - Abnormal; Notable for the following components:      Result Value   RDW 16.2 (*)    Abs Immature Granulocytes 0.08 (*)    All other components within normal limits  COMPREHENSIVE METABOLIC PANEL - Abnormal; Notable for the following components:   Potassium 3.3 (*)    Glucose, Bld 118 (*)    Calcium 7.6 (*)    Anion gap 17 (*)    All other components within normal limits    EKG None  Radiology Dg Chest Portable 1 View  Result Date: 11/09/2018 CLINICAL DATA:  Viral pneumonia EXAM: PORTABLE CHEST 1 VIEW COMPARISON:  December 24, 2017 FINDINGS: The heart size is enlarged. There is no pneumothorax. No focal infiltrate. The patient is status  post prior ACDF of the lower cervical spine. There is no acute osseous abnormality. Slight increased density overlying the lower lung zones is favored to be secondary to breast tissue attenuation artifact. IMPRESSION: No active disease. Electronically Signed   By: Constance Holster M.D.   On: 11/09/2018 09:11     Procedures Procedures (including critical care time)  Medications Ordered in ED Medications  albuterol (VENTOLIN HFA) 108 (90 Base) MCG/ACT inhaler 6 puff (6 puffs Inhalation Given 11/09/18 0955)  dexamethasone (DECADRON) injection 10 mg (10 mg Intravenous Given 11/09/18 0955)  ondansetron (ZOFRAN) 4 MG/2ML injection (4 mg  Given 11/09/18 0955)     Initial Impression / Assessment and Plan / ED Course  I have reviewed the triage vital signs and the nursing notes.  Pertinent labs & imaging results that were available during my care of the patient were reviewed by me and considered in my medical decision making (see chart for details).        JAZLENE HIGHMAN was evaluated in Emergency Department on 11/09/2018 for the symptoms described in the history of present illness. She was evaluated in the context of the global COVID-19 pandemic, which necessitated consideration that the patient might be at risk for infection with the SARS-CoV-2 virus that causes COVID-19. Institutional protocols and algorithms that pertain to the evaluation of patients at risk for COVID-19 are in a state of rapid change based on information released by regulatory bodies including the CDC and federal and state organizations. These policies and algorithms were followed during the patient's care in the ED. Patient with COVID-19.  Patient's hypoxia improved with albuterol.  She will be sent home with  Final Clinical Impressions(s) / ED Diagnoses   Final diagnoses:  COVID-19    ED Discharge Orders         Ordered    albuterol (VENTOLIN HFA) 108 (90 Base) MCG/ACT inhaler  Every 4 hours PRN     11/09/18 1459           Milton Ferguson, MD 11/09/18 1504

## 2018-11-09 NOTE — ED Notes (Signed)
Pt wanted to know why staff had not been in room to check in on her. Explained to pt that staff needed to minimize risk of exposure to self and others by limiting visits into room. Pt requested crackers, ginger ale and a blanket. Provided these things to pt and updated on disposition. Told pt I was not sure at this time if she would be admitted or discharged. Pt asked if the [facility] would taker her home. Told pt I was unsure.
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
## 2018-11-24 ENCOUNTER — Encounter (HOSPITAL_COMMUNITY): Payer: Self-pay | Admitting: Psychiatry

## 2018-11-24 ENCOUNTER — Ambulatory Visit (INDEPENDENT_AMBULATORY_CARE_PROVIDER_SITE_OTHER): Payer: Medicare Other | Admitting: Psychiatry

## 2018-11-24 ENCOUNTER — Other Ambulatory Visit: Payer: Self-pay

## 2018-11-24 DIAGNOSIS — F0630 Mood disorder due to known physiological condition, unspecified: Secondary | ICD-10-CM

## 2018-11-24 DIAGNOSIS — F4310 Post-traumatic stress disorder, unspecified: Secondary | ICD-10-CM | POA: Diagnosis not present

## 2018-11-24 MED ORDER — QUETIAPINE FUMARATE 100 MG PO TABS: 100.0000 mg | ORAL_TABLET | Freq: Every day | ORAL | 1 refills | Status: DC

## 2018-11-24 MED ORDER — BUSPIRONE HCL 10 MG PO TABS: 20.0000 mg | ORAL_TABLET | Freq: Two times a day (BID) | ORAL | 1 refills | Status: DC

## 2018-11-24 MED ORDER — FLUOXETINE HCL 40 MG PO CAPS: 40.0000 mg | ORAL_CAPSULE | Freq: Every day | ORAL | 1 refills | Status: DC

## 2018-11-24 MED ORDER — LAMOTRIGINE 100 MG PO TABS: 100.0000 mg | ORAL_TABLET | Freq: Every day | ORAL | 0 refills | Status: DC

## 2018-11-24 MED ORDER — HYDROXYZINE HCL 50 MG PO TABS: 50.0000 mg | ORAL_TABLET | Freq: Three times a day (TID) | ORAL | 1 refills | Status: DC | PRN

## 2018-11-24 NOTE — Patient Instructions (Signed)
1.Continuefluoxetine40 mg daily 2.Continue Buspar20 mg BID 3.Continuequetiapine100mg  at night 4.Continuelamotrigine 100 mg daily 5. Continuehydroxyzine50 mgthree timesa day 6. Next appointment: in January

## 2018-12-10 IMAGING — CT CT HEAD W/O CM
5 of 7 series · 17 of 47 positions shown, 18 images · non-contrast
Comparison: MRI cervical spine 10/23/2013.

CLINICAL DATA: Seizure today.

EXAM:
CT HEAD WITHOUT CONTRAST
CT CERVICAL SPINE WITHOUT CONTRAST
TECHNIQUE: Multidetector CT imaging of the head and cervical spine was
performed following the standard protocol without intravenous
contrast. Multiplanar CT image reconstructions of the cervical spine
were also generated.

[Series 3: head wo · axial · 0.40mm/px · z∈[+147,+232]mm · 3 of 35 slices shown, 4 images]
[im 9/35  brain]
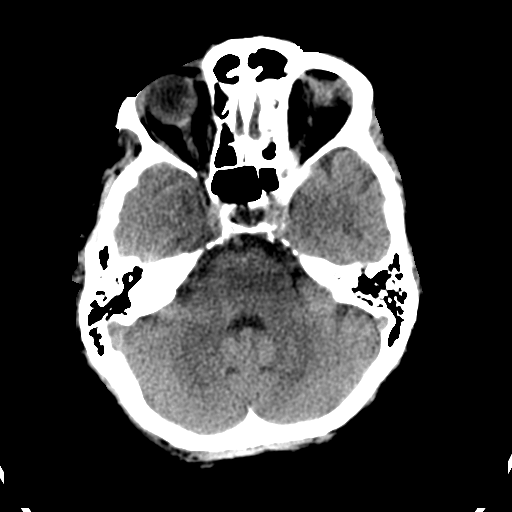
[im 9/35  bone]
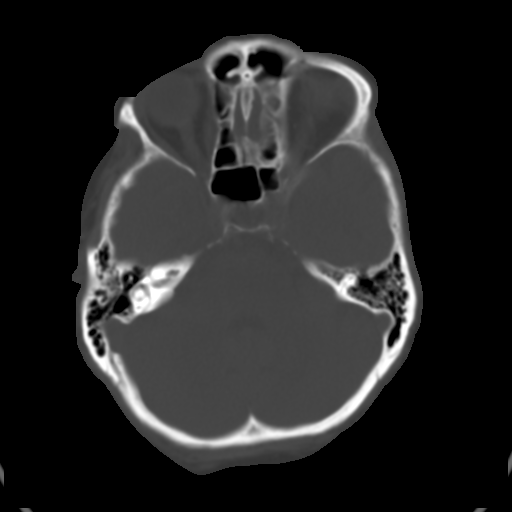
[im 18/35  brain]
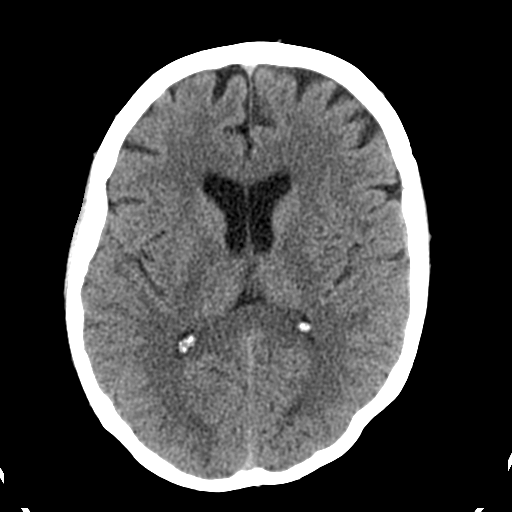
[im 26/35  brain]
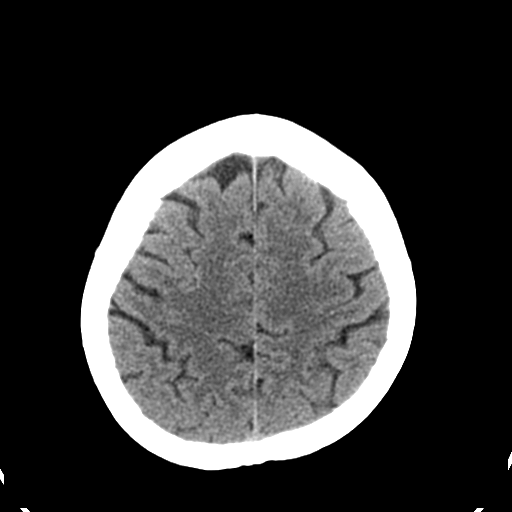

[Series 6: sagittal soft tissue · sagittal · 0.34mm/px · 1 of 60 slices shown]
[im 30/60  brain]
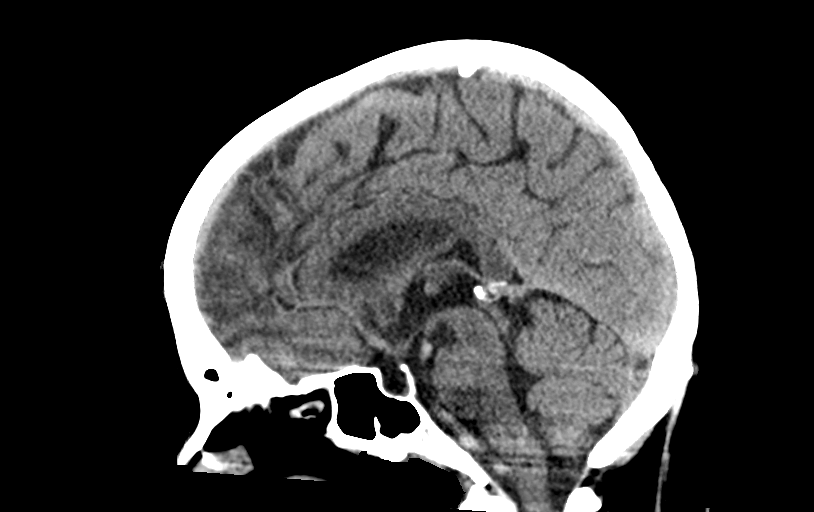

[Series 8: c spine soft · axial · 0.26mm/px · z∈[-42,-10]mm · 2 of 93 slices shown]
[im 8/93  brain]
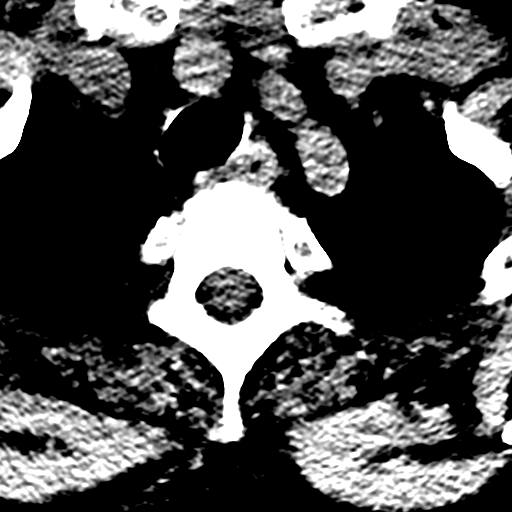
[im 24/93  brain]
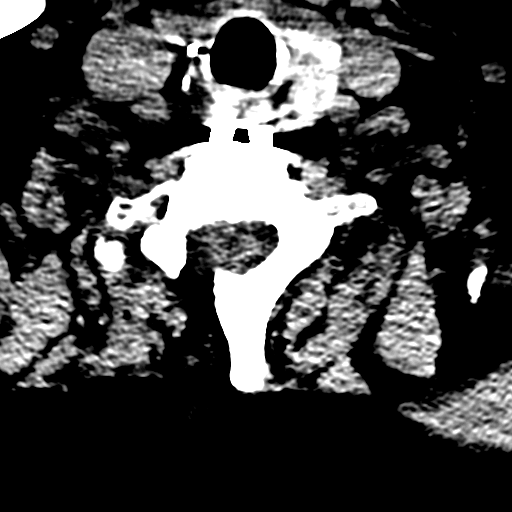

[Series 10: coronal bone · coronal · 0.27mm/px · 3 of 61 slices shown]
[im 23/61  brain]
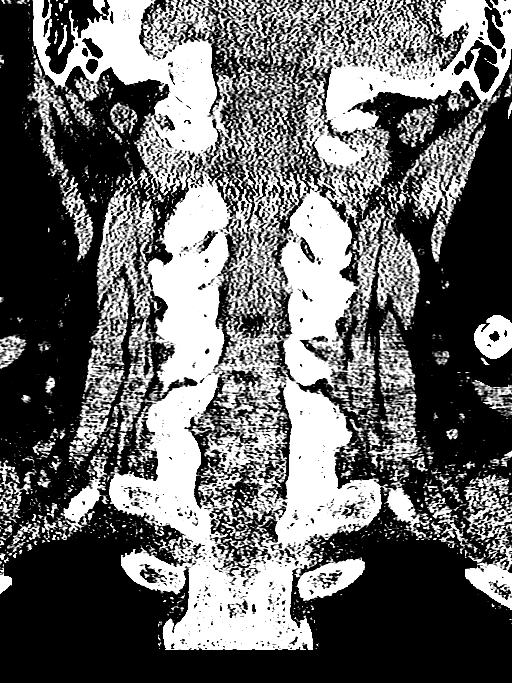
[im 31/61  brain]
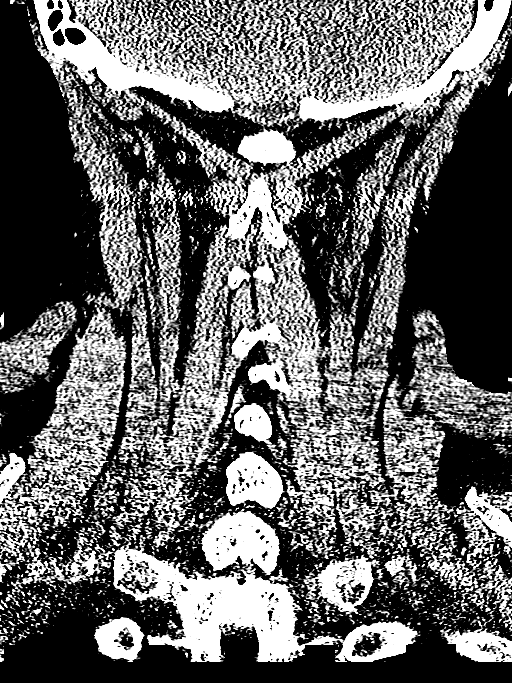
[im 38/61  brain]
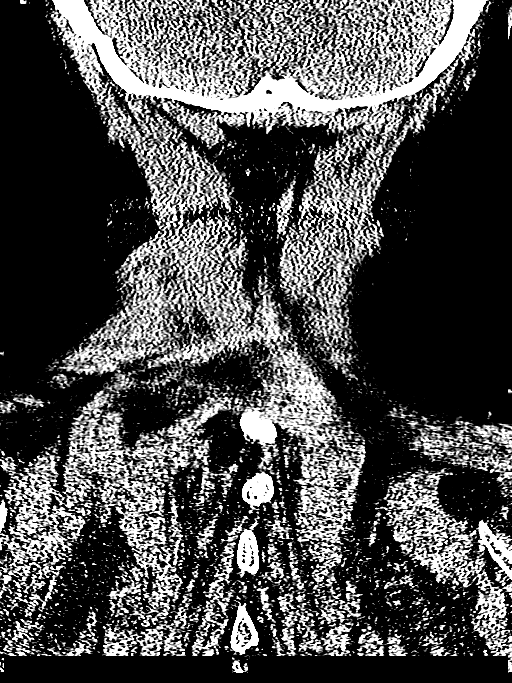

[Series 11: orthogonal bone · axial · 0.21mm/px · z∈[-49,+88]mm · 8 of 84 slices shown]
[im 8/84  bone]
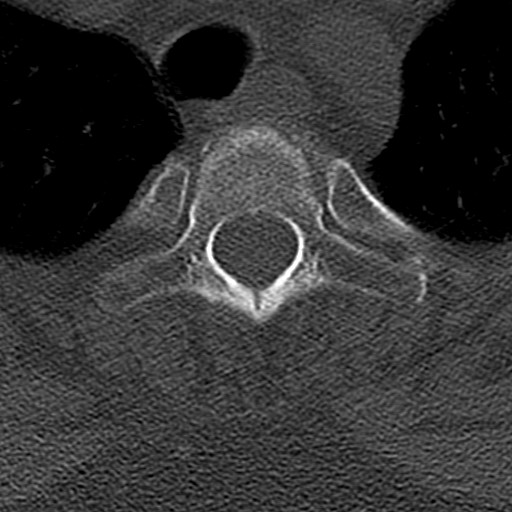
[im 16/84  bone]
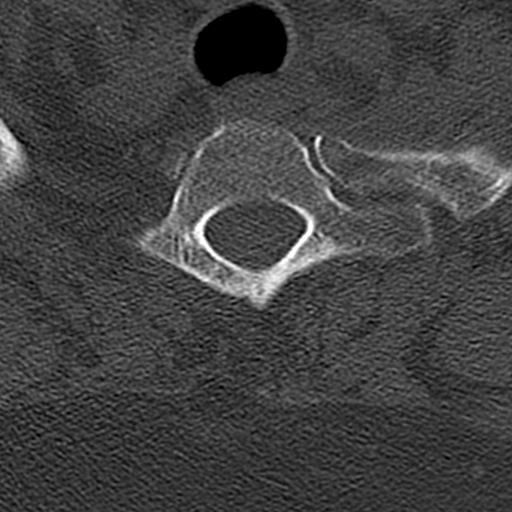
[im 31/84  bone]
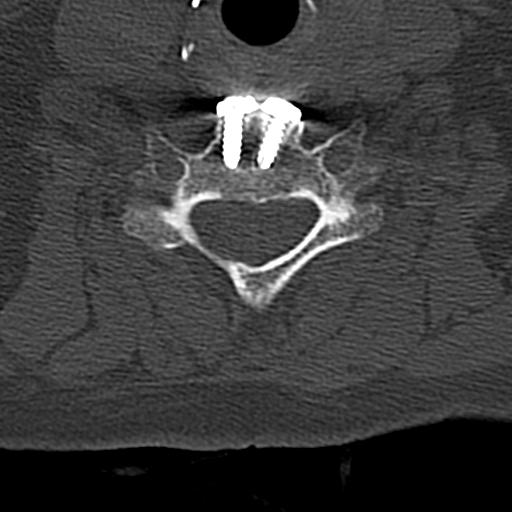
[im 38/84  bone]
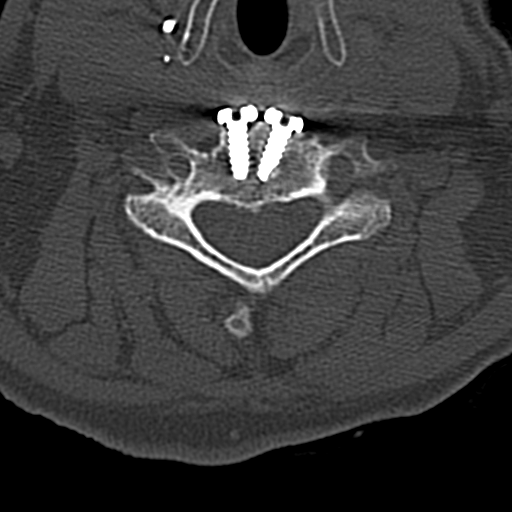
[im 46/84  bone]
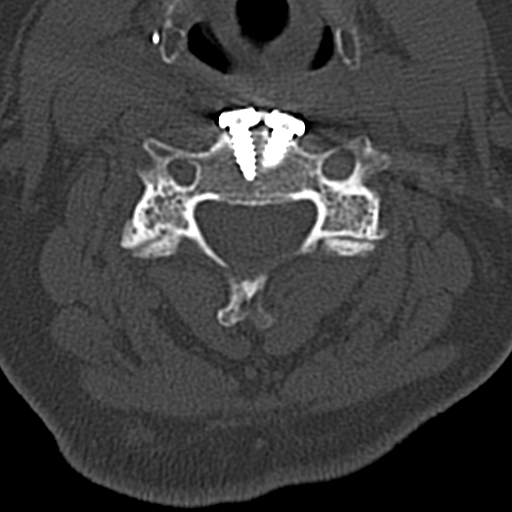
[im 53/84  bone]
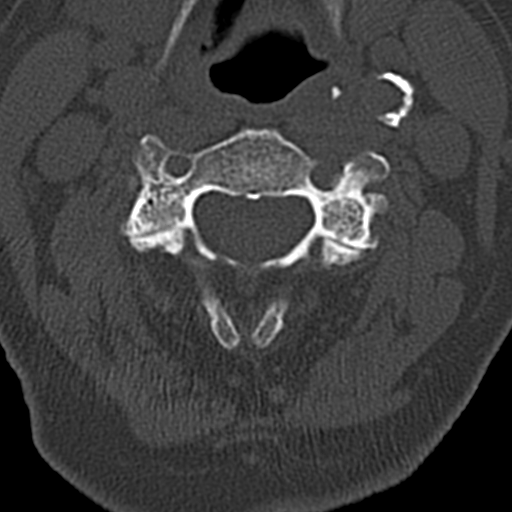
[im 68/84  bone]
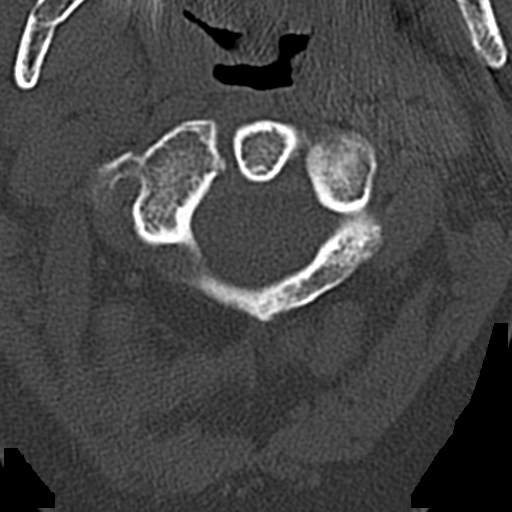
[im 76/84  bone]
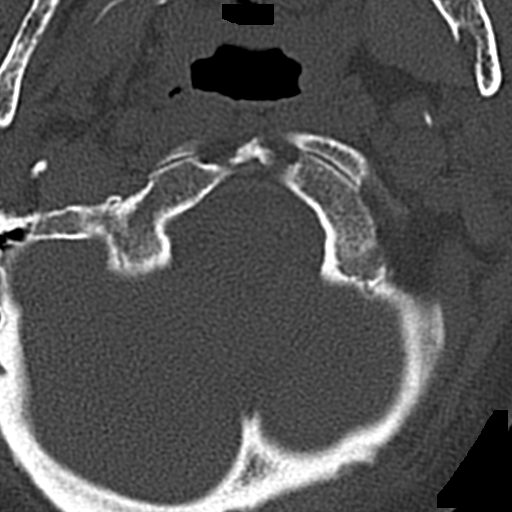

[17 of 47 positions shown; findings below may reference images not displayed]

FINDINGS: CT HEAD FINDINGS

Brain: Appears normal without hemorrhage, infarct, mass lesion, mass
effect, midline shift or abnormal extra-axial fluid collection. No
hydrocephalus or pneumocephalus.

Vascular: No hyperdense vessel or unexpected calcification.

Skull: Intact.

Sinuses/Orbits: Negative

Other: None.

CT CERVICAL SPINE FINDINGS

Alignment: Straightening of lordosis is noted.

Skull base and vertebrae: No fracture or focal lesion. The patient
is status post C4-7 fusion and right laminotomy at C5-6 and C6-7.

Soft tissues and spinal canal: No prevertebral fluid or swelling. No
visible canal hematoma.

Disc levels: Residual endplate spurring is seen at C6-7 eccentric to
the right but the central canal appears open.

Upper chest: Negative.

Other: None.
IMPRESSION: No acute abnormality head or cervical spine.

Status post C4-7 fusion.

## 2019-01-19 ENCOUNTER — Other Ambulatory Visit: Payer: Self-pay | Admitting: Physician Assistant

## 2019-01-22 ENCOUNTER — Ambulatory Visit
Admission: RE | Admit: 2019-01-22 | Discharge: 2019-01-22 | Disposition: A | Discharge status: Home / Self Care | Payer: Medicare Other | Source: Ambulatory Visit | Attending: Physician Assistant | Admitting: Physician Assistant

## 2019-01-22 DIAGNOSIS — R911 Solitary pulmonary nodule: Secondary | ICD-10-CM

## 2019-02-24 ENCOUNTER — Ambulatory Visit (HOSPITAL_COMMUNITY): Payer: Medicare Other | Admitting: Psychiatry

## 2019-03-15 ENCOUNTER — Encounter: Payer: Self-pay | Admitting: Psychiatry

## 2019-03-15 ENCOUNTER — Ambulatory Visit (INDEPENDENT_AMBULATORY_CARE_PROVIDER_SITE_OTHER): Payer: Medicare HMO | Admitting: Psychiatry

## 2019-03-15 ENCOUNTER — Other Ambulatory Visit: Payer: Self-pay

## 2019-03-15 DIAGNOSIS — F1020 Alcohol dependence, uncomplicated: Secondary | ICD-10-CM

## 2019-03-15 DIAGNOSIS — F4310 Post-traumatic stress disorder, unspecified: Secondary | ICD-10-CM

## 2019-03-15 DIAGNOSIS — F0630 Mood disorder due to known physiological condition, unspecified: Secondary | ICD-10-CM

## 2019-03-15 MED ORDER — BUSPIRONE HCL 10 MG PO TABS: 20.0000 mg | ORAL_TABLET | Freq: Two times a day (BID) | ORAL | 0 refills | Status: DC

## 2019-03-15 MED ORDER — QUETIAPINE FUMARATE 100 MG PO TABS: 100.0000 mg | ORAL_TABLET | Freq: Every day | ORAL | 0 refills | Status: DC

## 2019-03-15 MED ORDER — HYDROXYZINE HCL 50 MG PO TABS: 50.0000 mg | ORAL_TABLET | Freq: Three times a day (TID) | ORAL | 0 refills | Status: DC | PRN

## 2019-03-15 NOTE — Progress Notes (Signed)
[facility] Progress Note  Virtual Visit via Telephone Note  I connected with Tamara Mccullough on 03/15/19 at  9:00 AM EST by telephone and verified that I am speaking with the correct person using two identifiers.   I discussed the limitations, risks, security and privacy concerns of performing an evaluation and management service by telephone and the availability of in person appointments. I also discussed with the patient that there may be a patient responsible charge related to this service. The patient expressed understanding and agreed to proceed.   03/15/2019 9:07 AM Tamara Mccullough  MRN:  [id number]  Chief Complaint: " I am dealing with a cold."  HPI: Patient stated that she is dealing with a lot.  She informed that she lives by herself and this Covid related isolation has been very difficult for her.  She stated that she feels sad at times.  She informed that she contracted Covid back in September and since then her mood has not been as good as it used to be.  She has had difficulty with sleep for many many years.  She denied any suicidal ideations or intent.  She stated that Seroquel is not really helping much at night drying her mouth and as result she has to drink more water and consequently has to wake up couple of times at night to use the bathroom.  She stated that she would like to try something else increase of Seroquel.  Patient was pointed out that she is already on several different medications and if she does not find Seroquel helpful it can be discontinued and the doses of either Prozac or Lamictal can be increased for optimal effects.  She stated that Prozac keeps her up and she does not want to go up on its dose. Patient stated that she would rather continue Seroquel and continue the same medication combination. She denied drinking heavily.  Visit Diagnosis:    ICD-10-CM   1. PTSD (post-traumatic stress disorder)  F43.10   2. Mood disorder in conditions classified elsewhere   F06.30   3. Alcohol use disorder, severe, dependence ([place])  F10.20     Past Psychiatric History: Unspecified mood d/o, PTSD, alcohol dependence  Past Medical History:  Past Medical History:  Diagnosis Date  . Allergy   . Anemia   . Arthritis    back   . Asthma    slight  . Chronic back pain   . Depression   . Depression   . Edema   . Hyperlipidemia   . Hypertension   . Insomnia   . Morbid obesity ([place])   . Neuromuscular disorder ([place])   . Overdose of sleeping tabs 03/10/2013  . Panic attacks   . PONV (postoperative nausea and vomiting)   . Restless leg syndrome   . Seizures ([place])    hx of seizure 05/2013; last seizure April 2019  . Sleep apnea    has not used CPAP in 3 years- not using CPAP  . Thyroid disease     Past Surgical History:  Procedure Laterality Date  . ANTERIOR CERVICAL DECOMP/DISCECTOMY FUSION N/A 11/09/2013   Procedure:  Anterior cervical decompression/diskectomy/fusion cervical four-five ,cervical five-six,cervical six-seven;  Surgeon: Floyce Stakes, MD;  Location: [facility];  Service: Neurosurgery;  Laterality: N/A;  . BACK SURGERY    . COLONOSCOPY  2011  . CTR    . gastric by pass  6/11  . MINOR HEMORRHOIDECTOMY    . SPINE SURGERY    .  THYROID LOBECTOMY Right 08/12/2013   Procedure: RIGHT THYROID LOBECTOMY;  Surgeon: Odis Hollingshead, MD;  Location: [facility];  Service: General;  Laterality: Right;  . TUBAL LIGATION      Family Psychiatric History: see below  Family History:  Family History  Problem Relation Age of Onset  . Congestive Heart Failure Mother   . Diabetes Mother        died at 74  . Cancer Mother        cervical  . Arthritis Mother   . Asthma Mother   . Depression Mother   . Heart disease Mother   . Hypertension Mother   . Congestive Heart Failure Father   . Emphysema Father   . Alcohol abuse Father   . Diabetes Father   . Arthritis Father   . COPD Father   . Depression Father   . Heart disease Father        heart  attack died at 32  . Hypertension Father   . Stroke Father   . Suicidality Sister   . Colon polyps Sister   . Breast cancer Sister   . Alcohol abuse Brother   . Alcohol abuse Paternal Grandfather   . Alcohol abuse Brother   . Alcohol abuse Brother   . Colon cancer Neg Hx   . Esophageal cancer Neg Hx   . Stomach cancer Neg Hx   . Rectal cancer Neg Hx     Social History:  Social History   Socioeconomic History  . Marital status: Divorced    Spouse name: Not on file  . Number of children: 1  . Years of education: 77  . Highest education level: Not on file  Occupational History  . Occupation: disability    Comment: due to back, depression/bipolar  Tobacco Use  . Smoking status: Former Smoker    Quit date: 02/05/1996    Years since quitting: 23.1  . Smokeless tobacco: Never Used  Substance and Sexual Activity  . Alcohol use: Not Currently    Comment: heavy liquor for the last year   . Drug use: Not Currently    Types: Benzodiazepines  . Sexual activity: Not Currently  Other Topics Concern  . 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 Difficulty of Paying Living Expenses: Not on file  Food Insecurity:   . Worried About Charity fundraiser in the Last Year: Not on file  . Ran Out of Food in the Last Year: Not on file  Transportation Needs:   . Lack of Transportation (Medical): Not on file  . Lack of Transportation (Non-Medical): Not on file  Physical Activity:   . Days of Exercise per Week: Not on file  . Minutes of Exercise per Session: Not on file  Stress:   . Feeling of Stress : Not on file  Social Connections:   . Frequency of Communication with Friends and Family: Not on file  . Frequency of Social Gatherings with Friends and Family: Not on file  . Attends Religious Services: Not on file  . Active Member of Clubs or  Organizations: Not on file  . Attends Archivist Meetings: Not on file  . Marital Status: Not on file    Allergies:  Allergies  Allergen Reactions  . Codeine Itching, Nausea And  Vomiting, Rash and Other (See Comments)  . Ambien [Zolpidem Tartrate] Other (See Comments)    forgetful  . Eszopiclone Rash  . Gabapentin Other (See Comments)    Blisters in mouth   . Lansoprazole Palpitations  . Mirapex [Pramipexole] Other (See Comments)    Mouth dry  . Motrin [Ibuprofen] Nausea And Vomiting  . Prilosec [Omeprazole] Palpitations and Other (See Comments)    Makes heart beat fast.  . Trazodone Other (See Comments)    fainted  . Trazodone And Nefazodone Other (See Comments)    fainted    Metabolic Disorder Labs: Lab Results  Component Value Date   HGBA1C 4.8 11/07/2016   MPG 91 11/07/2016   No results found for: PROLACTIN Lab Results  Component Value Date   CHOL 170 11/07/2016   TRIG 113 11/07/2016   HDL 65 11/07/2016   CHOLHDL 2.6 11/07/2016   LDLCALC 84 11/07/2016   LDLCALC 133 (H) 04/22/2016   Lab Results  Component Value Date   TSH 3.66 11/07/2016   TSH 2.26 08/22/2016    Therapeutic Level Labs: No results found for: LITHIUM No results found for: VALPROATE No components found for:  CBMZ  Current Medications: Current Outpatient Medications  Medication Sig Dispense Refill  . acetaminophen (TYLENOL) 500 MG tablet Take 1,000 mg by mouth every 6 (six) hours as needed.    Marland Kitchen albuterol (VENTOLIN HFA) 108 (90 Base) MCG/ACT inhaler Inhale 2 puffs into the lungs every 4 (four) hours as needed for wheezing or shortness of breath. 6.7 g 0  . ALPRAZolam (XANAX) 0.25 MG tablet Take 0.5-1 tablets by mouth at bedtime as needed.    Marland Kitchen aspirin EC 81 MG tablet Take 81 mg by mouth daily.    . busPIRone (BUSPAR) 10 MG tablet Take 2 tablets (20 mg total) by mouth 2 (two) times daily. 360 tablet 1  . diclofenac sodium (VOLTAREN) 1 % GEL Apply 4 g topically 4 (four) times daily.  100 g 2  . FLUoxetine (PROZAC) 40 MG capsule Take 1 capsule (40 mg total) by mouth daily. 90 capsule 1  . hydrochlorothiazide (MICROZIDE) 12.5 MG capsule Take 1 capsule (12.5 mg total) by mouth daily. 90 capsule 3  . hydrOXYzine (ATARAX/VISTARIL) 50 MG tablet Take 1 tablet (50 mg total) by mouth 3 (three) times daily as needed. 270 tablet 1  . ibuprofen (ADVIL,MOTRIN) 200 MG tablet Take 2 tablets by mouth 2 (two) times daily.    Marland Kitchen ketoconazole (NIZORAL) 2 % cream Apply 1 application topically daily. (Patient taking differently: Apply 1 application topically daily as needed. ) 30 g 1  . lamoTRIgine (LAMICTAL) 100 MG tablet Take 1 tablet (100 mg total) by mouth daily. 90 tablet 0  . nystatin cream (MYCOSTATIN) APPLY TO AFFECTED AREA TWICE A DAY (Patient taking differently: Apply 1 application topically 2 (two) times daily as needed. APPLY TO AFFECTED AREA TWICE A DAY) 30 g 0  . predniSONE (DELTASONE) 20 MG tablet Take 20-60 mg by mouth as directed. Take 60mg  by mouth daily for three days, then take 40mg  by mouth daily for three days, then take 20mg  by mouth for three days, then stop    . QUEtiapine (SEROQUEL) 100 MG tablet Take 1 tablet (100 mg total) by mouth at bedtime. 90 tablet 1   No current facility-administered medications for this visit.     Psychiatric Specialty Exam: Review of Systems  There were no vitals taken for this visit.There is no height or weight on file to calculate BMI.  General Appearance: unable to assess due to phone visit  Eye Contact:  unable to assess due to phone visit   Speech:  Normal Rate  Volume:  Normal  Mood:  Irritable  Affect:  Congruent  Thought Process:  Goal Directed and Descriptions of Associations: Intact  Orientation:  Full (Time, Place, and Person)  Thought Content: Logical   Suicidal Thoughts:  No  Homicidal Thoughts:  No  Memory:  Immediate;   Good Recent;   Good  Judgement:  Fair  Insight:  Fair  Psychomotor Activity:  Normal   Concentration:  Concentration: Good and Attention Span: Good  Recall:  Good  Fund of Knowledge: Good  Language: Good  Akathisia:  Negative  Handed:  Right  AIMS (if indicated): not done  Assets:  Communication Skills Desire for Improvement Financial Resources/Insurance Housing  ADL's:  Intact  Cognition: WNL  Sleep:  Poor   Screenings: AIMS     Admission (Discharged) from 03/24/2015 in [facility]  AIMS Total Score  0    AUDIT     Admission (Discharged) from 03/24/2015 in [facility]  Alcohol Use Disorder Identification Test Final Score (AUDIT)  36    GAD-7     Office Visit from 02/07/2016 in [facility] Office Visit from 04/05/2015 in [facility]  Total GAD-7 Score  19  21    PHQ2-9     Office Visit from 03/24/2017 in [facility] Office Visit from 11/07/2016 in [facility] Office Visit from 08/22/2016 in [facility] Office Visit from 04/22/2016 in [facility] Visit from 02/07/2016 in [facility]  PHQ-2 Total Score  0  0  6  0  0  PHQ-9 Total Score  --  --  11  --  --       Assessment and Plan: 64 y.o. year old female with a history of mood disorder, PTSD, alcohol use disorder with history of DT,multiple spinal surgery , who was contacted via phone for f/up. She reported that her mood continues to be sad and she feels lonely due to COVID-19 pandemic related isolation. She agreed to continue the same regimen for now.  1. PTSD (post-traumatic stress disorder) - Continue Prozac 40 mg daily, recently filled the prescription. - busPIRone (BUSPAR) 10 MG tablet; Take 2 tablets (20 mg total) by mouth 2 (two) times daily.  Dispense: 360 tablet; Refill: 0  2. Mood disorder in conditions classified elsewhere - Continue lamictal 100 mg daily, recently filled the prescription. - hydrOXYzine  (ATARAX/VISTARIL) 50 MG tablet; Take 1 tablet (50 mg total) by mouth 3 (three) times daily as needed.  Dispense: 270 tablet; Refill: 0 - QUEtiapine (SEROQUEL) 100 MG tablet; Take 1 tablet (100 mg total) by mouth at bedtime.  Dispense: 90 tablet; Refill: 0  3. Alcohol use disorder, severe, dependence ([place]) - Encouraged to limit intake and abstain if possible.  F/up in 2 months.    [place] Crane, MD 03/15/2019, 9:07 AM
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
## 2019-05-13 ENCOUNTER — Other Ambulatory Visit: Payer: Self-pay

## 2019-05-13 ENCOUNTER — Ambulatory Visit (INDEPENDENT_AMBULATORY_CARE_PROVIDER_SITE_OTHER): Payer: Self-pay | Admitting: Psychiatry

## 2019-05-13 ENCOUNTER — Encounter (HOSPITAL_COMMUNITY): Payer: Self-pay | Admitting: Psychiatry

## 2019-05-13 DIAGNOSIS — F0630 Mood disorder due to known physiological condition, unspecified: Secondary | ICD-10-CM

## 2019-05-13 DIAGNOSIS — F4310 Post-traumatic stress disorder, unspecified: Secondary | ICD-10-CM

## 2019-05-13 MED ORDER — LAMOTRIGINE 100 MG PO TABS: 100.0000 mg | ORAL_TABLET | Freq: Every day | ORAL | 0 refills | Status: DC

## 2019-05-13 MED ORDER — QUETIAPINE FUMARATE 100 MG PO TABS: 100.0000 mg | ORAL_TABLET | Freq: Every day | ORAL | 0 refills | Status: DC

## 2019-05-13 MED ORDER — FLUOXETINE HCL 40 MG PO CAPS: 40.0000 mg | ORAL_CAPSULE | Freq: Every day | ORAL | 0 refills | Status: DC

## 2019-05-13 NOTE — Patient Instructions (Signed)
1.Continuefluoxetine40 mg daily 2.Continue Buspar20 mg BID 3.Continuequetiapine100mg  at night 4.Continuelamotrigine 100 mg daily 5.Decreasehydroxyzine25 mgthree timesa dayas needed for anxiety 6.Next appointment: 7/6 at 9;10

## 2019-06-03 ENCOUNTER — Telehealth (HOSPITAL_COMMUNITY): Payer: Self-pay | Admitting: *Deleted

## 2019-06-03 ENCOUNTER — Other Ambulatory Visit (HOSPITAL_COMMUNITY): Payer: Self-pay | Admitting: Psychiatry

## 2019-06-03 DIAGNOSIS — F4310 Post-traumatic stress disorder, unspecified: Secondary | ICD-10-CM

## 2019-06-03 MED ORDER — BUSPIRONE HCL 10 MG PO TABS: 20.0000 mg | ORAL_TABLET | Freq: Two times a day (BID) | ORAL | 0 refills | Status: DC

## 2019-06-03 NOTE — Telephone Encounter (Signed)
PATIENT CALLED STATED HAVING TROUBLE GETTING REFILL ON BuSpar. PER OFFICE NOTE 05/13/2019 Continue Buspar20 mg BID

## 2019-06-03 NOTE — Telephone Encounter (Signed)
Ordered refill
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
## 2019-08-10 ENCOUNTER — Encounter (HOSPITAL_COMMUNITY): Payer: Self-pay | Admitting: Psychiatry

## 2019-08-10 ENCOUNTER — Other Ambulatory Visit: Payer: Self-pay

## 2019-08-10 ENCOUNTER — Telehealth (INDEPENDENT_AMBULATORY_CARE_PROVIDER_SITE_OTHER): Payer: Medicare HMO | Admitting: Psychiatry

## 2019-08-10 DIAGNOSIS — F4310 Post-traumatic stress disorder, unspecified: Secondary | ICD-10-CM

## 2019-08-10 DIAGNOSIS — F0630 Mood disorder due to known physiological condition, unspecified: Secondary | ICD-10-CM | POA: Diagnosis not present

## 2019-08-10 MED ORDER — QUETIAPINE FUMARATE 100 MG PO TABS: 100.0000 mg | ORAL_TABLET | Freq: Every day | ORAL | 0 refills | Status: DC

## 2019-08-10 MED ORDER — LAMOTRIGINE 100 MG PO TABS: 100.0000 mg | ORAL_TABLET | Freq: Every day | ORAL | 0 refills | Status: DC

## 2019-08-10 MED ORDER — BUSPIRONE HCL 10 MG PO TABS: 20.0000 mg | ORAL_TABLET | Freq: Two times a day (BID) | ORAL | 0 refills | Status: DC

## 2019-08-10 MED ORDER — FLUOXETINE HCL 40 MG PO CAPS: 40.0000 mg | ORAL_CAPSULE | Freq: Every day | ORAL | 0 refills | Status: DC

## 2019-08-10 MED ORDER — HYDROXYZINE HCL 50 MG PO TABS: 50.0000 mg | ORAL_TABLET | Freq: Three times a day (TID) | ORAL | 0 refills | Status: DC | PRN

## 2019-08-10 NOTE — Patient Instructions (Signed)
1.Continuefluoxetine40 mg daily 2.Continue Buspar20 mg BID 3.Continuequetiapine100mg  at night 4.Continuelamotrigine 100 mg daily 5 Continuehydroxyzine25 mgthree timesa dayas needed for anxiety 6.Next appointment: 9/28 at 1:20

## 2019-08-18 ENCOUNTER — Telehealth (HOSPITAL_COMMUNITY): Payer: Self-pay

## 2019-08-18 NOTE — Telephone Encounter (Signed)
Medication management - Fax fro [employer] denying patient's coverage for Hydroxyzine.  Request has additional information needed for provider and patient to complete. Forms to be sent back in once completed.

## 2019-09-20 ENCOUNTER — Other Ambulatory Visit: Payer: Self-pay | Admitting: Neurosurgery

## 2019-09-23 NOTE — Progress Notes (Signed)
[id number] - [place], [place] [address] [place] [address] Phone: [phone] Fax: [phone]      Your procedure is scheduled on August 24  Report to [facility] Main Entrance "A" at 0600 A.M., and check in at the Admitting office.  Call this number if you have problems the morning of surgery:  [phone]  Call [phone] if you have any questions prior to your surgery date Monday-Friday 8am-4pm    Remember:  Do not eat or drink after midnight the night before your surgery      Take these medicines the morning of surgery with A SIP OF WATER  acetaminophen (TYLENOL)  busPIRone (BUSPAR) FLUoxetine (PROZAC) fluticasone (FLONASE) lamoTRIgine (LAMICTAL)   As of today, STOP taking any Aspirin (unless otherwise instructed by your surgeon) Aleve, Naproxen, Ibuprofen, Motrin, Advil, Goody's, BC's, all herbal medications, fish oil, and all vitamins. diclofenac sodium (VOLTAREN)                      Do not wear jewelry, make up, or nail polish            Do not wear lotions, powders, perfumes/colognes, or deodorant.            Do not shave 48 hours prior to surgery.             Do not bring valuables to the hospital.            [facility] is not responsible for any belongings or valuables.  Do NOT Smoke (Tobacco/Vaping) or drink Alcohol 24 hours prior to your procedure If you use a CPAP at night, you may bring all equipment for your overnight stay.   Contacts, glasses, dentures or bridgework may not be worn into surgery.      For patients admitted to the hospital, discharge time will be determined by your treatment team.   Patients discharged the day of surgery will not be allowed to drive home, and someone needs to stay with them for 24 hours.    Special instructions:   [place]- Preparing For Surgery  Before surgery, you can play an important role. Because skin is not sterile, your skin needs to be as free of germs as  possible. You can reduce the number of germs on your skin by washing with CHG (chlorahexidine gluconate) Soap before surgery.  CHG is an antiseptic cleaner which kills germs and bonds with the skin to continue killing germs even after washing.    Oral Hygiene is also important to reduce your risk of infection.  Remember - BRUSH YOUR TEETH THE MORNING OF SURGERY WITH YOUR REGULAR TOOTHPASTE  Please do not use if you have an allergy to CHG or antibacterial soaps. If your skin becomes reddened/irritated stop using the CHG.  Do not shave (including legs and underarms) for at least 48 hours prior to first CHG shower. It is OK to shave your face.  Please follow these instructions carefully.   1. Shower the NIGHT BEFORE SURGERY and the MORNING OF SURGERY with CHG Soap.   2. If you chose to wash your hair, wash your hair first as usual with your normal shampoo.  3. After you shampoo, rinse your hair and body thoroughly to remove the shampoo.  4. Use CHG as you would any other liquid soap. You can apply CHG directly to the skin and wash gently with a scrungie or a clean washcloth.   5. Apply  the CHG Soap to your body ONLY FROM THE NECK DOWN.  Do not use on open wounds or open sores. Avoid contact with your eyes, ears, mouth and genitals (private parts). Wash Face and genitals (private parts)  with your normal soap.   6. Wash thoroughly, paying special attention to the area where your surgery will be performed.  7. Thoroughly rinse your body with warm water from the neck down.  8. DO NOT shower/wash with your normal soap after using and rinsing off the CHG Soap.  9. Pat yourself dry with a CLEAN TOWEL.  10. Wear CLEAN PAJAMAS to bed the night before surgery  11. Place CLEAN SHEETS on your bed the night of your first shower and DO NOT SLEEP WITH PETS.   Day of Surgery: Wear Clean/Comfortable clothing the morning of surgery Do not apply any deodorants/lotions.   Remember to brush your teeth  WITH YOUR REGULAR TOOTHPASTE.   Please read over the following fact sheets that you were given.

## 2019-09-24 ENCOUNTER — Encounter (HOSPITAL_COMMUNITY)
Admission: RE | Admit: 2019-09-24 | Discharge: 2019-09-24 | Disposition: A | Discharge status: Home / Self Care | Payer: Medicare HMO | Source: Ambulatory Visit | Attending: Neurosurgery | Admitting: Neurosurgery

## 2019-09-24 ENCOUNTER — Other Ambulatory Visit (HOSPITAL_COMMUNITY)
Admission: RE | Admit: 2019-09-24 | Discharge: 2019-09-24 | Disposition: A | Discharge status: Home / Self Care | Payer: Medicare HMO | Source: Ambulatory Visit | Attending: Neurosurgery | Admitting: Neurosurgery

## 2019-09-24 ENCOUNTER — Encounter (HOSPITAL_COMMUNITY): Payer: Self-pay

## 2019-09-24 ENCOUNTER — Other Ambulatory Visit: Payer: Self-pay

## 2019-09-24 DIAGNOSIS — Z01812 Encounter for preprocedural laboratory examination: Secondary | ICD-10-CM | POA: Insufficient documentation

## 2019-09-24 DIAGNOSIS — Z20822 Contact with and (suspected) exposure to covid-19: Secondary | ICD-10-CM | POA: Insufficient documentation

## 2019-09-24 HISTORY — DX: Pneumonia, unspecified organism: J18.9

## 2019-09-24 HISTORY — DX: Dyspnea, unspecified: R06.00

## 2019-09-24 HISTORY — DX: Bipolar disorder, unspecified: F31.9

## 2019-09-24 HISTORY — DX: Schizophrenia, unspecified: F20.9

## 2019-09-24 LAB — CBC WITH DIFFERENTIAL/PLATELET
Abs Immature Granulocytes: 0.01 10*3/uL (ref 0.00–0.07)
Basophils Absolute: 0 10*3/uL (ref 0.0–0.1)
Basophils Relative: 1 %
Eosinophils Absolute: 0.2 10*3/uL (ref 0.0–0.5)
Eosinophils Relative: 4 %
HCT: 46.4 % — ABNORMAL HIGH (ref 36.0–46.0)
Hemoglobin: 14.3 g/dL (ref 12.0–15.0)
Immature Granulocytes: 0 %
Lymphocytes Relative: 34 %
Lymphs Abs: 1.6 10*3/uL (ref 0.7–4.0)
MCH: 28.7 pg (ref 26.0–34.0)
MCHC: 30.8 g/dL (ref 30.0–36.0)
MCV: 93 fL (ref 80.0–100.0)
Monocytes Absolute: 0.6 10*3/uL (ref 0.1–1.0)
Monocytes Relative: 12 %
Neutro Abs: 2.3 10*3/uL (ref 1.7–7.7)
Neutrophils Relative %: 49 %
Platelets: 192 10*3/uL (ref 150–400)
RBC: 4.99 MIL/uL (ref 3.87–5.11)
RDW: 15.2 % (ref 11.5–15.5)
WBC: 4.7 10*3/uL (ref 4.0–10.5)
nRBC: 0 % (ref 0.0–0.2)

## 2019-09-24 LAB — BASIC METABOLIC PANEL
Anion gap: 13 (ref 5–15)
BUN: 15 mg/dL (ref 8–23)
CO2: 24 mmol/L (ref 22–32)
Calcium: 8.4 mg/dL — ABNORMAL LOW (ref 8.9–10.3)
Chloride: 102 mmol/L (ref 98–111)
Creatinine, Ser: 1 mg/dL (ref 0.44–1.00)
GFR calc Af Amer: >60 mL/min (ref >60)
GFR calc non Af Amer: 59 mL/min — ABNORMAL LOW (ref >60)
Glucose, Bld: 116 mg/dL — ABNORMAL HIGH (ref 70–99)
Potassium: 4.2 mmol/L (ref 3.5–5.1)
Sodium: 139 mmol/L (ref 135–145)

## 2019-09-24 LAB — TYPE AND SCREEN
ABO/RH(D): A NEG
Antibody Screen: NEGATIVE

## 2019-09-24 LAB — SURGICAL PCR SCREEN
MRSA, PCR: NEGATIVE
Staphylococcus aureus: POSITIVE — AB

## 2019-09-24 LAB — SARS CORONAVIRUS 2 (TAT 6-24 HRS): SARS Coronavirus 2: NEGATIVE

## 2019-09-24 NOTE — Progress Notes (Signed)
PCP - Breejante williams @ [facility] in [place] Cardiologist - na   Chest x-ray - na EKG - 11/09/18 Stress Test - > 6 yrs ECHO - 2008 Cardiac Cath - na  Sleep Study -  CPAP - > 4 yrs. Pt. Couldn't tolerate CPAP, doesn't remember where it was done  Fasting Blood Sugar - na   Aspirin Instructions: last dose 09/23/19    COVID TEST-  09/24/19   Anesthesia review: ekg/ medical hx.  Patient denies shortness of breath, fever, cough and chest pain at PAT appointment   All instructions explained to the patient, with a verbal understanding of the material. Patient agrees to go over the instructions while at home for a better understanding. Patient also instructed to self quarantine after being tested for COVID-19. The opportunity to ask questions was provided.

## 2019-09-27 ENCOUNTER — Encounter (HOSPITAL_COMMUNITY): Payer: Self-pay | Admitting: Vascular Surgery

## 2019-09-27 NOTE — Progress Notes (Signed)
Anesthesia Chart Review:  Case: [id number] Date/Time: 09/28/19 0745   Procedure: PLIF - L5-S1 (N/A Back)   Anesthesia type: General   Pre-op diagnosis: Stenosis   Location: [facility] OR ROOM 20 / [place] [facility]   Surgeons: Earnie Larsson, MD      DISCUSSION: Patient is a 64 year old female scheduled for the above procedure.  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 + COVID-19 test 11/02/18. BMI is consistent with morbid obesity.   EKG from 11/2018 appears stable for the last several years--incomplete RBBB/LAFB/prolonged QT. Denied chest pain and SOB at PAT RN interview. Last ASA 09/23/19. 09/24/19 presurgical COVID-19 test negative. Anesthesia team to evaluate on the day of surgery.   VS: BP (!) 140/91   Pulse 88   Temp (!) 36.4 C (Temporal)   Resp 18   Ht 5\' 5"  (1.651 m)   Wt 109 kg   SpO2 97%   BMI 40.00 kg/m     PROVIDERS: Heywood Bene, PA-C is PCP ([place]) Norman Clay, MD is psychiatrist - She is not routinely followed by cardiology, but saw Terald Sleeper, MD in 2008 as part of pre-gastric bypass work-up with history of abnormal EKG (incomplete RBBB and LVH).   LABS: Labs reviewed: Acceptable for surgery. (all labs ordered are listed, but only abnormal results are displayed)  Labs Reviewed  SURGICAL PCR SCREEN - Abnormal; Notable for the following components:      Result Value   Staphylococcus aureus POSITIVE (*)    All other components within normal limits  BASIC METABOLIC PANEL - Abnormal; Notable for the following components:   Glucose, Bld 116 (*)    Calcium 8.4 (*)    GFR calc non Af Amer 59  (*)    All other components within normal limits  CBC WITH DIFFERENTIAL/PLATELET - Abnormal; Notable for the following components:   HCT 46.4 (*)    All other components within normal limits  CBC WITH DIFFERENTIAL/PLATELET  TYPE AND SCREEN     IMAGES: CT Chest 01/22/19 (ordered by PCP Heywood Bene, PA-C with plan to repeat CT in one year): IMPRESSION: - Stable 6 mm semi-solid nodule of left lower lobe. As solid component remains <6 mm, annual CT is recommended until 5 years of stability has been established. This recommendation follows the consensus statement: Guidelines for Management of Incidental Pulmonary Nodules Detected on CT Images: From the Fleischner Society 2017; Radiology 2017; 284:228-243. - Stable 4 mm left upper lobe nodule. - Additional nonspecific minimal patchy ground-glass density bilaterally, which could reflect atelectasis or infectious/inflammatory process. - Aortic Atherosclerosis (ICD10-I70.0).   EKG: 11/09/18:  Sinus rhythm Incomplete RBBB and LAFB RSR' in V1 or V2, right VCD or RVH Left ventricular hypertrophy Prolonged QT interval [QT 485, QTc 539 ms] Baseline wander in lead(s) II aVF V2 V6 No significant change since last tracing Confirmed by Merrily Pew [phone]) on 11/10/2018 10:20:05 PM - She has had EKGs showing findings consistent with incomplete RBBB and LAD/LAFB since at least 2015.   CV: Echo 05/27/06: SUMMARY  - Overall left ventricular systolic function was normal. Left     ventricular ejection fraction was estimated to be 60 %. There     was no diagnostic evidence of left  ventricular regional wall     motion abnormalities.  - There was mild aortic valvular regurgitation.  - Normal mitral valve   According to 05/27/06 cardiology note by Dr. Dannielle Burn, "Ms.  Berline Lopes underwent a dobutamine stress echocardiogram  in January of this  year that was negative for ischemia by clinical electrocardiographic and   echocardiographic criteria.  Baseline echo data demonstrated normal LV  size and contraction with no segmental wall motion abnormalities."   Past Medical History:  Diagnosis Date  . Allergy   . Anemia   . Arthritis    back   . Asthma    h/o  . Bipolar disorder ([place])   . Chronic back pain   . Depression   . Depression   . Dyspnea    on exertion  . Edema   . Hyperlipidemia   . Hypertension   . Insomnia   . Morbid obesity ([place])   . Neuromuscular disorder ([place])   . Overdose of sleeping tabs 03/10/2013  . Panic attacks   . Pneumonia    h/o  . PONV (postoperative nausea and vomiting)   . Restless leg syndrome   . Schizophrenia ([place])   . Seizures ([place])    hx of seizure 05/2013; last seizure April 2019  . Sleep apnea    has not used CPAP in 3 years- not using CPAP  . Thyroid disease     Past Surgical History:  Procedure Laterality Date  . ANTERIOR CERVICAL DECOMP/DISCECTOMY FUSION N/A 11/09/2013   Procedure:  Anterior cervical decompression/diskectomy/fusion cervical four-five ,cervical five-six,cervical six-seven;  Surgeon: Floyce Stakes, MD;  Location: [facility];  Service: Neurosurgery;  Laterality: N/A;  . BACK SURGERY    . COLONOSCOPY  2011  . [facility]    . gastric by pass  6/11  . MINOR HEMORRHOIDECTOMY    . SPINE SURGERY    . THYROID LOBECTOMY Right 08/12/2013   Procedure: RIGHT THYROID LOBECTOMY;  Surgeon: Odis Hollingshead, MD;  Location: [facility] ORS;  Service: General;  Laterality: Right;  . TUBAL LIGATION      MEDICATIONS: . acetaminophen (TYLENOL) 500 MG tablet  . ALPRAZolam (XANAX) 0.25 MG tablet  . aspirin EC 81 MG tablet  . busPIRone (BUSPAR) 10 MG tablet  . diclofenac sodium (VOLTAREN) 1 % GEL  . FLUoxetine (PROZAC) 40 MG capsule  . fluticasone (FLONASE) 50 MCG/ACT nasal spray  . gabapentin (NEURONTIN) 300 MG capsule  . hydrochlorothiazide (MICROZIDE) 12.5 MG capsule  . hydrOXYzine (ATARAX/VISTARIL) 50 MG tablet  . lamoTRIgine (LAMICTAL) 100 MG tablet  .  QUEtiapine (SEROQUEL) 100 MG tablet  . Vitamin D, Ergocalciferol, (DRISDOL) 1.25 MG (50000 UNIT) CAPS capsule   No current facility-administered medications for this encounter.    Myra Gianotti, PA-C Surgical Short Stay/Anesthesiology [facility] Phone [phone] [facility] Phone [phone] 09/27/2019 10:50 AM

## 2019-09-27 NOTE — Anesthesia Preprocedure Evaluation (Deleted)
Anesthesia Evaluation    Airway        Dental   Pulmonary former smoker,           Cardiovascular hypertension,      Neuro/Psych    GI/Hepatic   Endo/Other    Renal/GU      Musculoskeletal   Abdominal   Peds  Hematology   Anesthesia Other Findings   Reproductive/Obstetrics                             Anesthesia Physical Anesthesia Plan  ASA:   Anesthesia Plan:    Post-op Pain Management:    Induction:   PONV Risk Score and Plan:   Airway Management Planned:   Additional Equipment:   Intra-op Plan:   Post-operative Plan:   Informed Consent:   Plan Discussed with:   Anesthesia Plan Comments: (PAT note written 09/27/2019 by Myra Gianotti, PA-C. )        Anesthesia Quick Evaluation

## 2019-09-28 ENCOUNTER — Encounter (HOSPITAL_COMMUNITY): Admission: RE | Payer: Self-pay | Source: Home / Self Care

## 2019-09-28 ENCOUNTER — Ambulatory Visit (HOSPITAL_COMMUNITY): Admission: RE | Admit: 2019-09-28 | Payer: Medicare HMO | Source: Home / Self Care | Admitting: Neurosurgery

## 2019-09-28 SURGERY — POSTERIOR LUMBAR FUSION 1 LEVEL
Anesthesia: General | Site: Back

## 2019-10-01 ENCOUNTER — Other Ambulatory Visit (HOSPITAL_COMMUNITY)
Admission: RE | Admit: 2019-10-01 | Discharge: 2019-10-01 | Disposition: A | Discharge status: Home / Self Care | Payer: Medicare HMO | Source: Ambulatory Visit | Attending: Neurosurgery | Admitting: Neurosurgery

## 2019-10-01 DIAGNOSIS — Z01812 Encounter for preprocedural laboratory examination: Secondary | ICD-10-CM | POA: Diagnosis present

## 2019-10-01 DIAGNOSIS — Z20822 Contact with and (suspected) exposure to covid-19: Secondary | ICD-10-CM | POA: Diagnosis not present

## 2019-10-01 LAB — SARS CORONAVIRUS 2 (TAT 6-24 HRS): SARS Coronavirus 2: NEGATIVE

## 2019-10-04 NOTE — Anesthesia Preprocedure Evaluation (Addendum)
Anesthesia Evaluation  Patient identified by MRN, date of birth, ID band Patient awake    Reviewed: Allergy & Precautions, NPO status , Patient's Chart, lab work & pertinent test results  History of Anesthesia Complications (+) PONV and history of anesthetic complications  Airway Mallampati: I  TM Distance: >3 FB Neck ROM: Full    Dental  (+) Missing   Pulmonary asthma , sleep apnea , former smoker,    Pulmonary exam normal breath sounds clear to auscultation       Cardiovascular hypertension, Pt. on medications Normal cardiovascular exam Rhythm:Regular Rate:Normal  ECG: SR, rate 74   Neuro/Psych Seizures -, Well Controlled,  PSYCHIATRIC DISORDERS Anxiety Depression Bipolar Disorder Schizophrenia    GI/Hepatic negative GI ROS, Neg liver ROS,   Endo/Other  Morbid obesity  Renal/GU negative Renal ROS     Musculoskeletal  (+) Arthritis , Chronic back pain Restless leg syndrome   Abdominal   Peds  Hematology negative hematology ROS (+)   Anesthesia Other Findings Lumbar Stenosis  Reproductive/Obstetrics                           Anesthesia Physical Anesthesia Plan  ASA: III  Anesthesia Plan: General   Post-op Pain Management:    Induction: Intravenous  PONV Risk Score and Plan: 4 or greater and Midazolam, Dexamethasone, Ondansetron, Treatment may vary due to age or medical condition and Propofol infusion  Airway Management Planned: Oral ETT  Additional Equipment:   Intra-op Plan:   Post-operative Plan: Extubation in OR  Informed Consent: I have reviewed the patients History and Physical, chart, labs and discussed the procedure including the risks, benefits and alternatives for the proposed anesthesia with the patient or authorized representative who has indicated his/her understanding and acceptance.     Dental advisory given  Plan Discussed with: CRNA  Anesthesia Plan  Comments:        Anesthesia Quick Evaluation

## 2019-10-05 ENCOUNTER — Other Ambulatory Visit: Payer: Self-pay

## 2019-10-05 ENCOUNTER — Ambulatory Visit (HOSPITAL_COMMUNITY): Payer: Medicare HMO

## 2019-10-05 ENCOUNTER — Observation Stay (HOSPITAL_COMMUNITY)
Admission: RE | Admit: 2019-10-05 | Discharge: 2019-10-07 | Disposition: A | Discharge status: Home / Self Care | Payer: Medicare HMO | Attending: Neurosurgery | Admitting: Neurosurgery

## 2019-10-05 ENCOUNTER — Ambulatory Visit (HOSPITAL_COMMUNITY): Payer: Medicare HMO | Admitting: Certified Registered Nurse Anesthetist

## 2019-10-05 ENCOUNTER — Encounter (HOSPITAL_COMMUNITY)
Admission: RE | Disposition: A | Discharge status: Home / Self Care | Payer: Self-pay | Source: Home / Self Care | Attending: Neurosurgery

## 2019-10-05 ENCOUNTER — Encounter (HOSPITAL_COMMUNITY): Payer: Self-pay | Admitting: Neurosurgery

## 2019-10-05 DIAGNOSIS — G4730 Sleep apnea, unspecified: Secondary | ICD-10-CM | POA: Diagnosis not present

## 2019-10-05 DIAGNOSIS — M48061 Spinal stenosis, lumbar region without neurogenic claudication: Secondary | ICD-10-CM | POA: Diagnosis not present

## 2019-10-05 DIAGNOSIS — J45909 Unspecified asthma, uncomplicated: Secondary | ICD-10-CM | POA: Diagnosis not present

## 2019-10-05 DIAGNOSIS — Z419 Encounter for procedure for purposes other than remedying health state, unspecified: Secondary | ICD-10-CM

## 2019-10-05 DIAGNOSIS — M545 Low back pain: Secondary | ICD-10-CM | POA: Diagnosis present

## 2019-10-05 DIAGNOSIS — I10 Essential (primary) hypertension: Secondary | ICD-10-CM | POA: Diagnosis not present

## 2019-10-05 DIAGNOSIS — M5416 Radiculopathy, lumbar region: Secondary | ICD-10-CM | POA: Diagnosis not present

## 2019-10-05 DIAGNOSIS — Z6841 Body Mass Index (BMI) 40.0 and over, adult: Secondary | ICD-10-CM | POA: Diagnosis not present

## 2019-10-05 DIAGNOSIS — E785 Hyperlipidemia, unspecified: Secondary | ICD-10-CM | POA: Insufficient documentation

## 2019-10-05 DIAGNOSIS — Z79899 Other long term (current) drug therapy: Secondary | ICD-10-CM | POA: Insufficient documentation

## 2019-10-05 DIAGNOSIS — Z7982 Long term (current) use of aspirin: Secondary | ICD-10-CM | POA: Diagnosis not present

## 2019-10-05 DIAGNOSIS — F319 Bipolar disorder, unspecified: Secondary | ICD-10-CM | POA: Insufficient documentation

## 2019-10-05 LAB — GLUCOSE, CAPILLARY: Glucose-Capillary: 116 mg/dL — ABNORMAL HIGH (ref 70–99)

## 2019-10-05 LAB — ABO/RH: ABO/RH(D): A NEG

## 2019-10-05 SURGERY — POSTERIOR LUMBAR FUSION 1 LEVEL
Anesthesia: General | Site: Back

## 2019-10-05 MED ORDER — SODIUM CHLORIDE 0.9% FLUSH
3.0000 mL | Freq: Two times a day (BID) | INTRAVENOUS | Status: DC
  Administered 2019-10-05 – 2019-10-06 (×3): 3 mL via INTRAVENOUS

## 2019-10-05 MED ORDER — ONDANSETRON HCL 4 MG PO TABS: 4.0000 mg | ORAL_TABLET | Freq: Four times a day (QID) | ORAL | Status: DC | PRN

## 2019-10-05 MED ORDER — ONDANSETRON HCL 4 MG/2ML IJ SOLN
INTRAMUSCULAR | Status: DC | PRN
  Administered 2019-10-05: 4 mg via INTRAVENOUS

## 2019-10-05 MED ORDER — VANCOMYCIN HCL 1000 MG IV SOLR
INTRAVENOUS | Status: DC | PRN
  Administered 2019-10-05: 1000 mg

## 2019-10-05 MED ORDER — MIDAZOLAM HCL 5 MG/5ML IJ SOLN
INTRAMUSCULAR | Status: DC | PRN
  Administered 2019-10-05: 2 mg via INTRAVENOUS

## 2019-10-05 MED ORDER — PHENYLEPHRINE HCL-NACL 10-0.9 MG/250ML-% IV SOLN
INTRAVENOUS | Status: DC | PRN
  Administered 2019-10-05: 15 ug/min via INTRAVENOUS

## 2019-10-05 MED ORDER — DEXAMETHASONE SODIUM PHOSPHATE 10 MG/ML IJ SOLN
10.0000 mg | Freq: Once | INTRAMUSCULAR | Status: AC
  Administered 2019-10-05: 10 mg via INTRAVENOUS
  Filled 2019-10-05: qty 1

## 2019-10-05 MED ORDER — ONDANSETRON HCL 4 MG/2ML IJ SOLN
INTRAMUSCULAR | Status: AC
  Filled 2019-10-05: qty 2

## 2019-10-05 MED ORDER — HEMOSTATIC AGENTS (NO CHARGE) OPTIME
TOPICAL | Status: DC | PRN
  Administered 2019-10-05: 1 via TOPICAL

## 2019-10-05 MED ORDER — ALPRAZOLAM 0.25 MG PO TABS
0.2500 mg | ORAL_TABLET | Freq: Every day | ORAL | Status: DC
  Administered 2019-10-05 – 2019-10-06 (×2): 0.25 mg via ORAL
  Filled 2019-10-05 (×2): qty 1

## 2019-10-05 MED ORDER — OXYCODONE HCL 5 MG PO TABS
10.0000 mg | ORAL_TABLET | ORAL | Status: DC | PRN
  Administered 2019-10-05 – 2019-10-06 (×4): 10 mg via ORAL
  Filled 2019-10-05 (×4): qty 2

## 2019-10-05 MED ORDER — THROMBIN 20000 UNITS EX SOLR
CUTANEOUS | Status: AC
  Filled 2019-10-05: qty 20000

## 2019-10-05 MED ORDER — THROMBIN 20000 UNITS EX SOLR
CUTANEOUS | Status: DC | PRN
  Administered 2019-10-05: 500 mL via TOPICAL

## 2019-10-05 MED ORDER — PROPOFOL 10 MG/ML IV BOLUS
INTRAVENOUS | Status: AC
  Filled 2019-10-05: qty 40

## 2019-10-05 MED ORDER — PHENYLEPHRINE HCL-NACL 10-0.9 MG/250ML-% IV SOLN: INTRAVENOUS | Status: DC | PRN

## 2019-10-05 MED ORDER — POLYETHYLENE GLYCOL 3350 17 G PO PACK: 17.0000 g | PACK | Freq: Every day | ORAL | Status: DC | PRN

## 2019-10-05 MED ORDER — ALBUMIN HUMAN 5 % IV SOLN: INTRAVENOUS | Status: DC | PRN

## 2019-10-05 MED ORDER — ACETAMINOPHEN 325 MG PO TABS
650.0000 mg | ORAL_TABLET | ORAL | Status: DC | PRN
  Administered 2019-10-06 – 2019-10-07 (×4): 650 mg via ORAL
  Filled 2019-10-05 (×4): qty 2

## 2019-10-05 MED ORDER — GABAPENTIN 300 MG PO CAPS
300.0000 mg | ORAL_CAPSULE | Freq: Every day | ORAL | Status: DC
  Administered 2019-10-05 – 2019-10-06 (×2): 300 mg via ORAL
  Filled 2019-10-05 (×2): qty 1

## 2019-10-05 MED ORDER — CEFAZOLIN SODIUM-DEXTROSE 1-4 GM/50ML-% IV SOLN
1.0000 g | Freq: Three times a day (TID) | INTRAVENOUS | Status: AC
  Administered 2019-10-05 – 2019-10-06 (×2): 1 g via INTRAVENOUS
  Filled 2019-10-05 (×2): qty 50

## 2019-10-05 MED ORDER — HYDROMORPHONE HCL 1 MG/ML IJ SOLN
INTRAMUSCULAR | Status: AC
  Filled 2019-10-05: qty 0.5

## 2019-10-05 MED ORDER — DIAZEPAM 5 MG PO TABS
5.0000 mg | ORAL_TABLET | Freq: Four times a day (QID) | ORAL | Status: DC | PRN
  Administered 2019-10-06: 5 mg via ORAL
  Filled 2019-10-05: qty 1

## 2019-10-05 MED ORDER — HYDROMORPHONE HCL 1 MG/ML IJ SOLN: 1.0000 mg | INTRAMUSCULAR | Status: DC | PRN

## 2019-10-05 MED ORDER — ROCURONIUM BROMIDE 10 MG/ML (PF) SYRINGE
PREFILLED_SYRINGE | INTRAVENOUS | Status: DC | PRN
  Administered 2019-10-05: 10 mg via INTRAVENOUS
  Administered 2019-10-05 (×3): 20 mg via INTRAVENOUS
  Administered 2019-10-05: 60 mg via INTRAVENOUS

## 2019-10-05 MED ORDER — ACETAMINOPHEN 10 MG/ML IV SOLN: 1000.0000 mg | Freq: Once | INTRAVENOUS | Status: DC | PRN

## 2019-10-05 MED ORDER — CHLORHEXIDINE GLUCONATE CLOTH 2 % EX PADS: 6.0000 | MEDICATED_PAD | Freq: Once | CUTANEOUS | Status: DC

## 2019-10-05 MED ORDER — CHLORHEXIDINE GLUCONATE 0.12 % MT SOLN
15.0000 mL | Freq: Once | OROMUCOSAL | Status: AC
  Administered 2019-10-05: 15 mL via OROMUCOSAL
  Filled 2019-10-05: qty 15

## 2019-10-05 MED ORDER — ESMOLOL HCL 100 MG/10ML IV SOLN
INTRAVENOUS | Status: AC
  Filled 2019-10-05: qty 10

## 2019-10-05 MED ORDER — MENTHOL 3 MG MT LOZG
1.0000 | LOZENGE | OROMUCOSAL | Status: DC | PRN
  Filled 2019-10-05: qty 9

## 2019-10-05 MED ORDER — FLUOXETINE HCL 20 MG PO CAPS
40.0000 mg | ORAL_CAPSULE | Freq: Every day | ORAL | Status: DC
  Administered 2019-10-06 – 2019-10-07 (×2): 40 mg via ORAL
  Filled 2019-10-05 (×2): qty 2

## 2019-10-05 MED ORDER — ONDANSETRON HCL 4 MG/2ML IJ SOLN
4.0000 mg | Freq: Four times a day (QID) | INTRAMUSCULAR | Status: DC | PRN
  Administered 2019-10-05 – 2019-10-07 (×5): 4 mg via INTRAVENOUS
  Filled 2019-10-05 (×5): qty 2

## 2019-10-05 MED ORDER — VITAMIN D (ERGOCALCIFEROL) 1.25 MG (50000 UNIT) PO CAPS: 50000.0000 [IU] | ORAL_CAPSULE | ORAL | Status: DC

## 2019-10-05 MED ORDER — SODIUM CHLORIDE 0.9 % IV SOLN
INTRAVENOUS | Status: DC | PRN
  Administered 2019-10-05: 500 mL

## 2019-10-05 MED ORDER — SODIUM CHLORIDE 0.9 % IV SOLN: 250.0000 mL | INTRAVENOUS | Status: DC

## 2019-10-05 MED ORDER — LIDOCAINE 2% (20 MG/ML) 5 ML SYRINGE
INTRAMUSCULAR | Status: DC | PRN
  Administered 2019-10-05: 60 mg via INTRAVENOUS

## 2019-10-05 MED ORDER — CEFAZOLIN SODIUM-DEXTROSE 2-4 GM/100ML-% IV SOLN
2.0000 g | INTRAVENOUS | Status: AC
  Administered 2019-10-05: 2 g via INTRAVENOUS
  Filled 2019-10-05: qty 100

## 2019-10-05 MED ORDER — ROCURONIUM BROMIDE 10 MG/ML (PF) SYRINGE
PREFILLED_SYRINGE | INTRAVENOUS | Status: AC
  Filled 2019-10-05: qty 20

## 2019-10-05 MED ORDER — FENTANYL CITRATE (PF) 250 MCG/5ML IJ SOLN
INTRAMUSCULAR | Status: AC
  Filled 2019-10-05: qty 5

## 2019-10-05 MED ORDER — VANCOMYCIN HCL 1000 MG IV SOLR
INTRAVENOUS | Status: AC
  Filled 2019-10-05: qty 1000

## 2019-10-05 MED ORDER — DEXMEDETOMIDINE (PRECEDEX) IN NS 20 MCG/5ML (4 MCG/ML) IV SYRINGE
PREFILLED_SYRINGE | INTRAVENOUS | Status: AC
  Filled 2019-10-05: qty 5

## 2019-10-05 MED ORDER — LAMOTRIGINE 100 MG PO TABS
100.0000 mg | ORAL_TABLET | Freq: Every day | ORAL | Status: DC
  Administered 2019-10-06 – 2019-10-07 (×2): 100 mg via ORAL
  Filled 2019-10-05 (×2): qty 1

## 2019-10-05 MED ORDER — SUGAMMADEX SODIUM 200 MG/2ML IV SOLN
INTRAVENOUS | Status: DC | PRN
  Administered 2019-10-05: 250 mg via INTRAVENOUS

## 2019-10-05 MED ORDER — ORAL CARE MOUTH RINSE: 15.0000 mL | Freq: Once | OROMUCOSAL | Status: AC

## 2019-10-05 MED ORDER — ROCURONIUM BROMIDE 10 MG/ML (PF) SYRINGE
PREFILLED_SYRINGE | INTRAVENOUS | Status: AC
  Filled 2019-10-05: qty 10

## 2019-10-05 MED ORDER — BUPIVACAINE HCL (PF) 0.25 % IJ SOLN
INTRAMUSCULAR | Status: DC | PRN
  Administered 2019-10-05: 20 mL

## 2019-10-05 MED ORDER — ONDANSETRON HCL 4 MG/2ML IJ SOLN: 4.0000 mg | Freq: Once | INTRAMUSCULAR | Status: DC | PRN

## 2019-10-05 MED ORDER — FLUTICASONE PROPIONATE 50 MCG/ACT NA SUSP
1.0000 | Freq: Every day | NASAL | Status: DC | PRN
  Filled 2019-10-05: qty 16

## 2019-10-05 MED ORDER — FENTANYL CITRATE (PF) 100 MCG/2ML IJ SOLN: 25.0000 ug | INTRAMUSCULAR | Status: DC | PRN

## 2019-10-05 MED ORDER — FLEET ENEMA 7-19 GM/118ML RE ENEM: 1.0000 | ENEMA | Freq: Once | RECTAL | Status: DC | PRN

## 2019-10-05 MED ORDER — ACETAMINOPHEN 650 MG RE SUPP: 650.0000 mg | RECTAL | Status: DC | PRN

## 2019-10-05 MED ORDER — HYDROCHLOROTHIAZIDE 12.5 MG PO CAPS
12.5000 mg | ORAL_CAPSULE | Freq: Every day | ORAL | Status: DC
  Administered 2019-10-06 – 2019-10-07 (×2): 12.5 mg via ORAL
  Filled 2019-10-05 (×2): qty 1

## 2019-10-05 MED ORDER — PROPOFOL 500 MG/50ML IV EMUL
INTRAVENOUS | Status: DC | PRN
  Administered 2019-10-05: 25 ug/kg/min via INTRAVENOUS

## 2019-10-05 MED ORDER — LIDOCAINE 2% (20 MG/ML) 5 ML SYRINGE
INTRAMUSCULAR | Status: AC
  Filled 2019-10-05: qty 5

## 2019-10-05 MED ORDER — FENTANYL CITRATE (PF) 250 MCG/5ML IJ SOLN
INTRAMUSCULAR | Status: DC | PRN
Start: 2019-10-05 — End: 2019-10-05
  Administered 2019-10-05 (×7): 50 ug via INTRAVENOUS
  Administered 2019-10-05: 100 ug via INTRAVENOUS
  Administered 2019-10-05 (×4): 50 ug via INTRAVENOUS

## 2019-10-05 MED ORDER — ACETAMINOPHEN 500 MG PO TABS: 1000.0000 mg | ORAL_TABLET | Freq: Once | ORAL | Status: DC

## 2019-10-05 MED ORDER — PROPOFOL 10 MG/ML IV BOLUS
INTRAVENOUS | Status: DC | PRN
  Administered 2019-10-05: 50 mg via INTRAVENOUS
  Administered 2019-10-05: 200 mg via INTRAVENOUS

## 2019-10-05 MED ORDER — BUPIVACAINE HCL (PF) 0.25 % IJ SOLN
INTRAMUSCULAR | Status: AC
  Filled 2019-10-05: qty 30

## 2019-10-05 MED ORDER — HYDROMORPHONE HCL 1 MG/ML IJ SOLN
INTRAMUSCULAR | Status: DC | PRN
  Administered 2019-10-05 (×2): .5 mg via INTRAVENOUS

## 2019-10-05 MED ORDER — BUSPIRONE HCL 10 MG PO TABS
20.0000 mg | ORAL_TABLET | Freq: Two times a day (BID) | ORAL | Status: DC
  Administered 2019-10-05 – 2019-10-07 (×4): 20 mg via ORAL
  Filled 2019-10-05 (×5): qty 2

## 2019-10-05 MED ORDER — ESMOLOL HCL 100 MG/10ML IV SOLN
INTRAVENOUS | Status: DC | PRN
  Administered 2019-10-05: 20 mg via INTRAVENOUS

## 2019-10-05 MED ORDER — QUETIAPINE FUMARATE 100 MG PO TABS
100.0000 mg | ORAL_TABLET | Freq: Every day | ORAL | Status: DC
  Administered 2019-10-05 – 2019-10-06 (×2): 100 mg via ORAL
  Filled 2019-10-05 (×3): qty 1

## 2019-10-05 MED ORDER — MIDAZOLAM HCL 2 MG/2ML IJ SOLN
INTRAMUSCULAR | Status: AC
  Filled 2019-10-05: qty 2

## 2019-10-05 MED ORDER — PHENOL 1.4 % MT LIQD: 1.0000 | OROMUCOSAL | Status: DC | PRN

## 2019-10-05 MED ORDER — 0.9 % SODIUM CHLORIDE (POUR BTL) OPTIME
TOPICAL | Status: DC | PRN
  Administered 2019-10-05: 1000 mL

## 2019-10-05 MED ORDER — SODIUM CHLORIDE 0.9% FLUSH: 3.0000 mL | INTRAVENOUS | Status: DC | PRN

## 2019-10-05 MED ORDER — HYDROCODONE-ACETAMINOPHEN 10-325 MG PO TABS: 1.0000 | ORAL_TABLET | ORAL | Status: DC | PRN

## 2019-10-05 MED ORDER — DEXMEDETOMIDINE (PRECEDEX) IN NS 20 MCG/5ML (4 MCG/ML) IV SYRINGE
PREFILLED_SYRINGE | INTRAVENOUS | Status: DC | PRN
  Administered 2019-10-05 (×2): 8 ug via INTRAVENOUS
  Administered 2019-10-05: 4 ug via INTRAVENOUS

## 2019-10-05 MED ORDER — LACTATED RINGERS IV SOLN: INTRAVENOUS | Status: DC

## 2019-10-05 MED ORDER — HYDROXYZINE HCL 25 MG PO TABS
100.0000 mg | ORAL_TABLET | Freq: Two times a day (BID) | ORAL | Status: DC
  Administered 2019-10-05 – 2019-10-07 (×4): 100 mg via ORAL
  Filled 2019-10-05 (×4): qty 4

## 2019-10-05 MED ORDER — BISACODYL 10 MG RE SUPP: 10.0000 mg | Freq: Every day | RECTAL | Status: DC | PRN

## 2019-10-05 SURGICAL SUPPLY — 77 items
BAG DECANTER FOR FLEXI CONT (MISCELLANEOUS) ×3 IMPLANT
BENZOIN TINCTURE PRP APPL 2/3 (GAUZE/BANDAGES/DRESSINGS) ×3 IMPLANT
BLADE CLIPPER SURG (BLADE) IMPLANT
BUR CUTTER 7.0 ROUND (BURR) IMPLANT
BUR MATCHSTICK NEURO 3.0 LAGG (BURR) ×3 IMPLANT
CANISTER SUCT 3000ML PPV (MISCELLANEOUS) ×3 IMPLANT
CAP LCK SPNE (Orthopedic Implant) ×4 IMPLANT
CAP LOCK SPINE RADIUS (Orthopedic Implant) ×4 IMPLANT
CAP LOCKING (Orthopedic Implant) ×12 IMPLANT
CARTRIDGE OIL MAESTRO DRILL (MISCELLANEOUS) ×1 IMPLANT
CLOSURE STERI-STRIP 1/2X4 (GAUZE/BANDAGES/DRESSINGS) ×1
CLOSURE WOUND 1/2 X4 (GAUZE/BANDAGES/DRESSINGS) ×2
CLSR STERI-STRIP ANTIMIC 1/2X4 (GAUZE/BANDAGES/DRESSINGS) ×2 IMPLANT
CNTNR URN SCR LID CUP LEK RST (MISCELLANEOUS) ×1 IMPLANT
CONT SPEC 4OZ STRL OR WHT (MISCELLANEOUS) ×3
COVER BACK TABLE 60X90IN (DRAPES) ×3 IMPLANT
COVER WAND RF STERILE (DRAPES) ×3 IMPLANT
DECANTER SPIKE VIAL GLASS SM (MISCELLANEOUS) ×3 IMPLANT
DERMABOND ADVANCED (GAUZE/BANDAGES/DRESSINGS) ×2
DERMABOND ADVANCED .7 DNX12 (GAUZE/BANDAGES/DRESSINGS) ×1 IMPLANT
DIFFUSER DRILL AIR PNEUMATIC (MISCELLANEOUS) ×3 IMPLANT
DRAPE C-ARM 42X72 X-RAY (DRAPES) ×6 IMPLANT
DRAPE HALF SHEET 40X57 (DRAPES) ×3 IMPLANT
DRAPE LAPAROTOMY 100X72X124 (DRAPES) ×3 IMPLANT
DRAPE SURG 17X23 STRL (DRAPES) ×12 IMPLANT
DRSG OPSITE POSTOP 4X6 (GAUZE/BANDAGES/DRESSINGS) ×3 IMPLANT
DURAPREP 26ML APPLICATOR (WOUND CARE) ×3 IMPLANT
DURASEAL APPLICATOR TIP (TIP) ×3 IMPLANT
DURASEAL SPINE SEALANT 3ML (MISCELLANEOUS) ×3 IMPLANT
ELECT REM PT RETURN 9FT ADLT (ELECTROSURGICAL) ×3
ELECTRODE REM PT RTRN 9FT ADLT (ELECTROSURGICAL) ×1 IMPLANT
EVACUATOR 1/8 PVC DRAIN (DRAIN) IMPLANT
GAUZE 4X4 16PLY RFD (DISPOSABLE) IMPLANT
GAUZE SPONGE 4X4 12PLY STRL (GAUZE/BANDAGES/DRESSINGS) IMPLANT
GAUZE SPONGE 4X4 16PLY XRAY LF (GAUZE/BANDAGES/DRESSINGS) ×3 IMPLANT
GLOVE BIO SURGEON STRL SZ 6.5 (GLOVE) ×2 IMPLANT
GLOVE BIO SURGEON STRL SZ7 (GLOVE) ×6 IMPLANT
GLOVE BIO SURGEONS STRL SZ 6.5 (GLOVE) ×1
GLOVE BIOGEL PI IND STRL 6.5 (GLOVE) ×3 IMPLANT
GLOVE BIOGEL PI IND STRL 8 (GLOVE) ×2 IMPLANT
GLOVE BIOGEL PI INDICATOR 6.5 (GLOVE) ×6
GLOVE BIOGEL PI INDICATOR 8 (GLOVE) ×4
GLOVE ECLIPSE 9.0 STRL (GLOVE) ×6 IMPLANT
GLOVE EXAM NITRILE XL STR (GLOVE) IMPLANT
GLOVE SURG SS PI 7.5 STRL IVOR (GLOVE) ×9 IMPLANT
GOWN STRL REUS W/ TWL LRG LVL3 (GOWN DISPOSABLE) ×3 IMPLANT
GOWN STRL REUS W/ TWL XL LVL3 (GOWN DISPOSABLE) ×3 IMPLANT
GOWN STRL REUS W/TWL 2XL LVL3 (GOWN DISPOSABLE) IMPLANT
GOWN STRL REUS W/TWL LRG LVL3 (GOWN DISPOSABLE) ×9
GOWN STRL REUS W/TWL XL LVL3 (GOWN DISPOSABLE) ×9
GRAFT DURAGEN MATRIX 1WX1L (Tissue) ×3 IMPLANT
KIT BASIN OR (CUSTOM PROCEDURE TRAY) ×3 IMPLANT
KIT TURNOVER KIT B (KITS) ×3 IMPLANT
MILL MEDIUM DISP (BLADE) ×3 IMPLANT
NEEDLE HYPO 22GX1.5 SAFETY (NEEDLE) ×3 IMPLANT
NS IRRIG 1000ML POUR BTL (IV SOLUTION) ×3 IMPLANT
OIL CARTRIDGE MAESTRO DRILL (MISCELLANEOUS) ×3
PACK LAMINECTOMY NEURO (CUSTOM PROCEDURE TRAY) ×3 IMPLANT
PATTIES SURGICAL 1X1 (DISPOSABLE) ×3 IMPLANT
ROD RADIUS 35MM (Rod) ×6 IMPLANT
SCREW 5.75X40M (Screw) ×6 IMPLANT
SCREW 5.75X45MM (Screw) ×6 IMPLANT
SPACER PL CATALYFT LONG 11 (Spacer) ×6 IMPLANT
SPONGE SURGIFOAM ABS GEL 100 (HEMOSTASIS) ×3 IMPLANT
STRIP CLOSURE SKIN 1/2X4 (GAUZE/BANDAGES/DRESSINGS) ×4 IMPLANT
SUT ETHILON 3 0 FSL (SUTURE) ×3 IMPLANT
SUT NURALON 4 0 TR CR/8 (SUTURE) ×3 IMPLANT
SUT PROLENE 5 0 C1 (SUTURE) ×3 IMPLANT
SUT VIC AB 0 CT1 18XCR BRD8 (SUTURE) ×2 IMPLANT
SUT VIC AB 0 CT1 8-18 (SUTURE) ×6
SUT VIC AB 2-0 CT1 18 (SUTURE) ×3 IMPLANT
SUT VIC AB 3-0 SH 8-18 (SUTURE) ×6 IMPLANT
SYR BULB IRRIG 60ML STRL (SYRINGE) ×3 IMPLANT
TOWEL GREEN STERILE (TOWEL DISPOSABLE) ×3 IMPLANT
TOWEL GREEN STERILE FF (TOWEL DISPOSABLE) ×3 IMPLANT
TRAY FOLEY MTR SLVR 16FR STAT (SET/KITS/TRAYS/PACK) ×3 IMPLANT
WATER STERILE IRR 1000ML POUR (IV SOLUTION) ×3 IMPLANT

## 2019-10-05 NOTE — Transfer of Care (Signed)
Immediate Anesthesia Transfer of Care Note  Patient: KALIOPI BLYDEN  Procedure(s) Performed: POSTERIOR LUMBAR INTERBODY FUSION - LUMBAR FIVE-SACRAL ONE (N/A Back)  Patient Location: PACU  Anesthesia Type:General  Level of Consciousness: awake, alert , oriented and patient cooperative  Airway & Oxygen Therapy: Patient Spontanous Breathing and Patient connected to nasal cannula oxygen  Post-op Assessment: Report given to RN, Post -op Vital signs reviewed and stable and Patient moving all extremities X 4  Post vital signs: Reviewed and stable  Last Vitals:  Vitals Value Taken Time  BP 145/84 10/05/19 1414  Temp    Pulse 93 10/05/19 1416  Resp 14 10/05/19 1416  SpO2 96 % 10/05/19 1416  Vitals shown include unvalidated device data.  Last Pain:  Vitals:   10/05/19 0925  TempSrc:   PainSc: 8       Patients Stated Pain Goal: 3 (31/59/45 8592)  Complications: No complications documented.

## 2019-10-05 NOTE — H&P (Signed)
Tamara Mccullough is an 64 y.o. female.   Chief Complaint: Left leg pain HPI: 64 year old female status post prior decompressive surgery at L5-S1 and remote surgery L45.  Patient presents now with worsening left lower extremity radicular pain failing conservative management.  Work-up demonstrates evidence of significant foraminal stenosis on the left at L5S1 with some degree of a far lateral disc protrusion.  Patient is failed conservative management.  She presents now for lumbar decompression and fusion in hopes of improving her symptoms.  Past Medical History:  Diagnosis Date  . Allergy   . Anemia   . Arthritis    back   . Asthma    h/o  . Bipolar disorder ([place])   . Chronic back pain   . Depression   . Depression   . Dyspnea    on exertion  . Edema   . Hyperlipidemia   . Hypertension   . Insomnia   . Morbid obesity ([place])   . Neuromuscular disorder ([place])   . Overdose of sleeping tabs 03/10/2013  . Panic attacks   . Pneumonia    h/o  . PONV (postoperative nausea and vomiting)   . Restless leg syndrome   . Schizophrenia ([place])   . Seizures ([place])    hx of seizure 05/2013; last seizure April 2019  . Sleep apnea    has not used CPAP in 3 years- not using CPAP  . Thyroid disease     Past Surgical History:  Procedure Laterality Date  . ANTERIOR CERVICAL DECOMP/DISCECTOMY FUSION N/A 11/09/2013   Procedure:  Anterior cervical decompression/diskectomy/fusion cervical four-five ,cervical five-six,cervical six-seven;  Surgeon: Floyce Stakes, MD;  Location: [facility];  Service: Neurosurgery;  Laterality: N/A;  . BACK SURGERY    . COLONOSCOPY  2011  . CTR    . gastric by pass  6/11  . MINOR HEMORRHOIDECTOMY    . SPINE SURGERY    . THYROID LOBECTOMY Right 08/12/2013   Procedure: RIGHT THYROID LOBECTOMY;  Surgeon: Odis Hollingshead, MD;  Location: [facility]S;  Service: General;  Laterality: Right;  . TUBAL LIGATION      Family History  Problem Relation Age of Onset  . Congestive  Heart Failure Mother   . Diabetes Mother        died at 57  . Cancer Mother        cervical  . Arthritis Mother   . Asthma Mother   . Depression Mother   . Heart disease Mother   . Hypertension Mother   . Congestive Heart Failure Father   . Emphysema Father   . Alcohol abuse Father   . Diabetes Father   . Arthritis Father   . COPD Father   . Depression Father   . Heart disease Father        heart attack died at 22  . Hypertension Father   . Stroke Father   . Suicidality Sister   . Colon polyps Sister   . Breast cancer Sister   . Alcohol abuse Brother   . Alcohol abuse Paternal Grandfather   . Alcohol abuse Brother   . Alcohol abuse Brother   . Colon cancer Neg Hx   . Esophageal cancer Neg Hx   . Stomach cancer Neg Hx   . Rectal cancer Neg Hx    Social History:  reports that she quit smoking about 23 years ago. She has never used smokeless tobacco. She reports previous alcohol use. She reports previous drug use. Drug:  Benzodiazepines.  Allergies:  Allergies  Allergen Reactions  . Codeine Itching, Nausea And Vomiting, Rash and Other (See Comments)  . Melatonin Other (See Comments)    seizure  . Ambien [Zolpidem Tartrate] Other (See Comments)    forgetful  . Eszopiclone Rash  . Lansoprazole Palpitations  . Mirapex [Pramipexole] Other (See Comments)    Mouth dry  . Motrin [Ibuprofen] Nausea And Vomiting  . Prilosec [Omeprazole] Palpitations and Other (See Comments)    Makes heart beat fast.  . Trazodone And Nefazodone Other (See Comments)    fainted    Medications Prior to Admission  Medication Sig Dispense Refill  . acetaminophen (TYLENOL) 500 MG tablet Take 1,000 mg by mouth every 4 (four) hours as needed for moderate pain.     Marland Kitchen ALPRAZolam (XANAX) 0.25 MG tablet Take 0.25 mg by mouth at bedtime.     Marland Kitchen aspirin EC 81 MG tablet Take 81 mg by mouth daily.    . busPIRone (BUSPAR) 10 MG tablet Take 2 tablets (20 mg total) by mouth 2 (two) times daily. 360 tablet 0   . diclofenac sodium (VOLTAREN) 1 % GEL Apply 4 g topically 4 (four) times daily. 100 g 2  . FLUoxetine (PROZAC) 40 MG capsule Take 1 capsule (40 mg total) by mouth daily. 90 capsule 0  . fluticasone (FLONASE) 50 MCG/ACT nasal spray Place 1 spray into both nostrils daily as needed (congestion).    . gabapentin (NEURONTIN) 300 MG capsule Take 300 mg by mouth daily at 6 PM.     . hydrochlorothiazide (MICROZIDE) 12.5 MG capsule Take 1 capsule (12.5 mg total) by mouth daily. 90 capsule 3  . hydrOXYzine (ATARAX/VISTARIL) 50 MG tablet Take 1 tablet (50 mg total) by mouth 3 (three) times daily as needed. (Patient taking differently: Take 100 mg by mouth 2 (two) times daily. ) 270 tablet 0  . lamoTRIgine (LAMICTAL) 100 MG tablet Take 1 tablet (100 mg total) by mouth daily. 90 tablet 0  . QUEtiapine (SEROQUEL) 100 MG tablet Take 1 tablet (100 mg total) by mouth at bedtime. 90 tablet 0  . Vitamin D, Ergocalciferol, (DRISDOL) 1.25 MG (50000 UNIT) CAPS capsule Take 50,000 Units by mouth every 30 (thirty) days.      Results for orders placed or performed during the hospital encounter of 10/05/19 (from the past 48 hour(s))  Glucose, capillary     Status: Abnormal   Collection Time: 10/05/19  8:16 AM  Result Value Ref Range   Glucose-Capillary 116 (H) 70 - 99 mg/dL    Comment: Glucose reference range applies only to samples taken after fasting for at least 8 hours.   Comment 1 Notify RN    Comment 2 Document in Chart    No results found.  Pertinent items noted in HPI and remainder of comprehensive ROS otherwise negative.  Blood pressure (!) 158/64, pulse 85, temperature 98.4 F (36.9 C), temperature source Oral, resp. rate 18, height 5\' 3"  (1.6 m), weight 108.9 kg, SpO2 100 %.  Patient is awake and alert.  She is oriented and appropriate.  Speech is fluent.  Judgment insight are intact.  Cranial nerve function normal bilateral motor examination reveals mild weakness of dorsiflexion her left foot  otherwise motor strength intact.  Sensor examination with decrease sensation pinprick light touch in her left L5 dermatome.  Deep tendon reflexes normal active scepter Achilles reflexes are absent bilaterally.  No evidence of long track signs.  Gait is antalgic.  Posture is mildly flexed peer  examination head ears eyes nose throat is unremarkable her chest and abdomen are benign.  Extremities are free from injury deformity. 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  Plan bilateral L5-S1 decompressive laminotomies and foraminotomies followed by posterior lumbar interbody fusion utilizing interbody cages, local harvested autograft, and augmented with posterior lateral arthrodesis utilizing nonsegmental pedicle screw fixation and local autograft.  Risks and benefits of been explained.  Patient wishes to proceed.  Mallie Mussel A Sean Macwilliams 10/05/2019, 10:20 AM

## 2019-10-05 NOTE — Anesthesia Procedure Notes (Signed)
Procedure Name: Intubation Date/Time: 10/05/2019 10:42 AM Performed by: Colin Benton, CRNA Pre-anesthesia Checklist: Emergency Drugs available, Patient identified, Suction available and Patient being monitored Patient Re-evaluated:Patient Re-evaluated prior to induction Oxygen Delivery Method: Circle system utilized Preoxygenation: Pre-oxygenation with 100% oxygen Induction Type: IV induction Ventilation: Mask ventilation without difficulty Laryngoscope Size: Miller and 2 Grade View: Grade I Tube type: Oral Tube size: 7.0 mm Number of attempts: 1 Airway Equipment and Method: Stylet Placement Confirmation: ETT inserted through vocal cords under direct vision,  positive ETCO2 and breath sounds checked- equal and bilateral Secured at: 22 cm Tube secured with: Tape Dental Injury: Teeth and Oropharynx as per pre-operative assessment

## 2019-10-05 NOTE — Brief Op Note (Signed)
10/05/2019  2:02 PM  PATIENT:  [name]  64 y.o. female  PRE-OPERATIVE DIAGNOSIS:  Stenosis  POST-OPERATIVE DIAGNOSIS:  Stenosis  PROCEDURE:  Procedure(s): POSTERIOR LUMBAR INTERBODY FUSION - LUMBAR FIVE-SACRAL ONE (N/A)  SURGEON:  Surgeon(s) and Role:    * Earnie Larsson, MD - Primary    * Dawley, Theodoro Doing, DO - Assisting  PHYSICIAN ASSISTANT:   ASSISTANTS: Bergman,NP   ANESTHESIA:   general  EBL:  450 mL   BLOOD ADMINISTERED:none  DRAINS: none   LOCAL MEDICATIONS USED:  MARCAINE     SPECIMEN:  No Specimen  DISPOSITION OF SPECIMEN:  N/A  COUNTS:  YES  TOURNIQUET:  * No tourniquets in log *  DICTATION: .Dragon Dictation  PLAN OF CARE: Admit for overnight observation  PATIENT DISPOSITION:  PACU - hemodynamically stable.   Delay start of Pharmacological VTE agent (>24hrs) due to surgical blood loss or risk of bleeding: yes

## 2019-10-05 NOTE — Op Note (Signed)
Date of procedure: 10/05/2019  Date of dictation: Same  Service: Neurosurgery  Preoperative diagnosis: L5-S1 foraminal stenosis with radiculopathy  Postoperative diagnosis: Same  Procedure Name: L5-S1 redo decompressive laminotomies and foraminotomies, more than would be required for simple interbody fusion alone.  L5-S1 posterior lumbar interbody fusion utilizing interbody cages and locally harvested autograft  L5-S1 posterior lateral arthrodesis utilizing nonsegmental pedicle screw fixation and local autograft  Surgeon:Zamara Cozad A.Naryiah Schley, M.D.  Asst. Surgeon: Ardean Larsen, NP  Anesthesia: General  Indication: 64 year old female status post bilateral L5-S1 decompressive surgeries in the past presents with worsening left-sided L5 radicular symptoms failing conservative management.  Work-up demonstrates evidence of recurrent foraminal stenosis laterally with compression of her exiting left L5 nerve root.  Patient presents now for lumbar decompression and fusion in hopes of improving her symptoms.  Operative note: After induction anesthesia, patient position prone onto Wilson frame and appropriately padded.  Lumbar region prepped and draped sterilely.  Incision made overlying L5-S1.  Dissection performed bilaterally.  Retractor placed.  Fluoroscopy used.  Levels confirmed.  Previous decompressive laminotomies were dissected free bilaterally.  On the right side the laminotomy was in fact a complete hemilaminectomy.  On the left side the laminotomy was widened using Kerrison rongeurs, Leksell rongeurs and high-speed drill.  In the process of doing the redual laminotomy a small partial-thickness dural laceration was performed.  This was oversewn using 4-0 Nurolon in a watertight fashion.  Ligamentum flavum and epidural scar were resected.  Complete inferior facetectomies and majority superior facetectomies were performed bilaterally.  Foraminotomies along the course the exiting L5 and S1 nerve  roots were completed bilaterally.  Bilateral discectomies were then performed.  With the dissipates distracted on patient's right side the disc base on the left side was prepared for interbody fusion.  A Medtronic 11 mm expandable cage was then impacted into place and expanded.  Distractor was moved patient's right side.  To space prepared on the right side.  Soft tissue removed interspace.  Morselized autograft packed in their space.  A second cage was then impacted into place and expanded.  Pedicles of L5 and S1 were identified using surface landmarks and intraoperative fluoroscopy with superficial bone around the pedicle was then removed using high-speed drill patient pedicles then probed using pedicle all each pedicle tract was then probed and found to be solidly within the bone.  Each pedicle tract was then tapped with a screw tap.  Screw temple was probed and found to be solidly within the bone.  5.75 mm radius brand screws from [employer] were placed bilaterally at L5 and S1.  Final images reveal good position of cages and the hardware at the proper upper level with normal alignment of spine.  Transverse processes and sacral ala were decorticated.  Morselized autograft was packed posterior laterally.  Short segment titanium rod placed over the screw heads at L5 and S1.  Locking caps placed over the screws and locking caps and engaged with construct under compression.  DuraGen was placed over the dural repair.  DuraSeal was placed over the laminectomy defect.  Gelfoam was also placed over the laminectomy defect.  Vancomycin powder was placed in deep wound space.  Wounds and closed in layers of Vicryl sutures.  Steri-Strips and sterile dressing were applied.  No apparent complications.  Patient tolerated the procedure well and she returned to the recovery room postop.

## 2019-10-05 NOTE — Anesthesia Postprocedure Evaluation (Signed)
Anesthesia Post Note  Patient: Tamara Mccullough  Procedure(s) Performed: POSTERIOR LUMBAR INTERBODY FUSION - LUMBAR FIVE-SACRAL ONE (N/A Back)     Patient location during evaluation: PACU Anesthesia Type: General Level of consciousness: awake and alert Pain management: pain level controlled Vital Signs Assessment: post-procedure vital signs reviewed and stable Respiratory status: spontaneous breathing, nonlabored ventilation, respiratory function stable and patient connected to nasal cannula oxygen Cardiovascular status: blood pressure returned to baseline and stable Postop Assessment: no apparent nausea or vomiting Anesthetic complications: no   No complications documented.  Last Vitals:  Vitals:   10/05/19 1548 10/05/19 1921  BP: 130/73 135/75  Pulse: 91 73  Resp: 16 18  Temp: 36.9 C 36.8 C  SpO2: 96% 98%    Last Pain:  Vitals:   10/05/19 1957  TempSrc:   PainSc: 8                  Azelyn Batie P Novalee Horsfall

## 2019-10-06 DIAGNOSIS — M48061 Spinal stenosis, lumbar region without neurogenic claudication: Secondary | ICD-10-CM | POA: Diagnosis not present

## 2019-10-06 MED ORDER — TRAMADOL HCL 50 MG PO TABS
50.0000 mg | ORAL_TABLET | Freq: Four times a day (QID) | ORAL | Status: DC | PRN
  Administered 2019-10-06 – 2019-10-07 (×4): 100 mg via ORAL
  Filled 2019-10-06: qty 1
  Filled 2019-10-06: qty 2
  Filled 2019-10-06: qty 1
  Filled 2019-10-06 (×2): qty 2

## 2019-10-06 MED ORDER — BUTALBITAL-APAP-CAFFEINE 50-325-40 MG PO TABS
1.0000 | ORAL_TABLET | ORAL | Status: DC | PRN
  Administered 2019-10-06 – 2019-10-07 (×4): 1 via ORAL
  Filled 2019-10-06 (×4): qty 1

## 2019-10-06 NOTE — Progress Notes (Signed)
Postop day 1.  Back pain well controlled.  Preop radicular pain resolved.  No new numbness or weakness.  Patient with difficulty with persistent nausea vomiting and some headache.  Afebrile.  Vital signs are stable.  She is awake and alert.  She is oriented and appropriate.  Motor and sensory function are intact.  Her wound is clean and dry.  Her abdomen is soft.  Patient is actively retching.  Status post lumbar decompression and fusion.  Not sure what to make the patient's current nausea vomiting condition.  Continue supportive efforts and observation.  Symptoms could be potentially related to a small intraoperative CSF leak which was oversewn and repaired primarily although could also be related to anesthesia and her prior gastric bypass.

## 2019-10-06 NOTE — Evaluation (Signed)
Occupational Therapy Evaluation Patient Details Name: Tamara Mccullough MRN: [id number] DOB: 23-Sep-1955 Today's Date: 10/06/2019    History of Present Illness This 64 y.o. female admitted for L5-S1 redo compressive laminotomies and foraminotomies as wel as L5-S1 PLIF.  PMH includes:  sleep apnea, seizures, schizophrenia, panic attack, morbid obesity, depression, bipolar disorder, chronic back pain.    Clinical Impression   Pt admitted with above. She demonstrates the below listed deficits and will benefit from continued OT to maximize safety and independence with BADLs.  Pt presents to OT with generalized weakness, increased pain, decreased activity tolerance, and decreased awareness of precautions.  She currently requires mod A for LB ADLs and toileting and will likely benefit from the use of adaptive equipment to perform these tasks safely.   Today's session was limited by c/o severe HA.   Will continue to follow acutely.         Home health OT;Supervision - Intermittent    Equipment Recommendations  None recommended by OT    Recommendations for Other Services       Precautions / Restrictions Precautions Precautions: Back Required Braces or Orthoses: Spinal Brace Spinal Brace: Lumbar corset;Applied in sitting position      Mobility Bed Mobility Overal bed mobility: Needs Assistance Bed Mobility: Rolling;Sit to Sidelying Rolling: Supervision       Sit to sidelying: Supervision General bed mobility comments: pt instructed in log rolling   Transfers Overall transfer level: Needs assistance Equipment used: Rolling walker (2 wheeled) Transfers: Sit to/from [facility] Sit to Stand: Min guard Stand pivot transfers: Min guard            Balance Overall balance assessment: Mild deficits observed, not formally tested                                         ADL either performed or assessed with clinical judgement   ADL Overall ADL's :  Needs assistance/impaired Eating/Feeding: Independent   Grooming: Wash/dry hands;Wash/dry face;Oral care;Brushing hair;Min guard;Standing   Upper Body Bathing: Supervision/ safety;Sitting   Lower Body Bathing: Moderate assistance;Sit to/from stand Lower Body Bathing Details (indicate cue type and reason): unable to perform figure 4, but reports she could do this PTA  Upper Body Dressing : Supervision/safety   Lower Body Dressing: Maximal assistance;Sit to/from stand Lower Body Dressing Details (indicate cue type and reason): unable to perform figure 4, but reports she could do this PTA  Toilet Transfer: Min guard;Ambulation;Comfort height toilet;Grab bars;RW   Toileting- Clothing Manipulation and Hygiene: Sit to/from stand;Moderate assistance Toileting - Clothing Manipulation Details (indicate cue type and reason): unable to access peri area      Functional mobility during ADLs: Min guard;Rolling walker       Vision         Perception     Praxis      Pertinent Vitals/Pain Pain Assessment: Faces Faces Pain Scale: Hurts even more Pain Location: headache  Pain Descriptors / Indicators: Headache;Grimacing;Guarding;Moaning Pain Intervention(s): Monitored during session;Limited activity within patient's tolerance;Repositioned     Hand Dominance Right   Extremity/Trunk Assessment Upper Extremity Assessment Upper Extremity Assessment: Overall WFL for tasks assessed   Lower Extremity Assessment Lower Extremity Assessment: Defer to PT evaluation   Cervical / Trunk Assessment Cervical / Trunk Assessment: Other exceptions Cervical / Trunk Exceptions: s/p lumbar fusion and morbid obesity    Communication Communication Communication: No difficulties  Cognition Arousal/Alertness: Awake/alert Behavior During Therapy: Anxious;WFL for tasks assessed/performed Overall Cognitive Status: Within Functional Limits for tasks assessed                                      General Comments       Exercises     Shoulder Instructions      Home Living Family/patient expects to be discharged to:: Private residence Living Arrangements: Non-relatives/Friends Available Help at Discharge: Family;Available PRN/intermittently Type of Home: House Home Access: Stairs to enter [employer] of Steps: 1 Entrance Stairs-Rails: Right Home Layout: One level     Bathroom Shower/Tub: Tub/shower unit;Curtain   Biochemist, clinical: Standard     Home Equipment: Environmental consultant - 2 wheels;Walker - 4 wheels;Cane - single point;Bedside commode;Shower seat;Grab bars - tub/shower   Additional Comments: lives with a lady who has Alzheimer's who can assist her physically       Prior Functioning/Environment Level of Independence: Independent with assistive device(s)                 OT Problem List: Decreased strength;Decreased activity tolerance;Impaired balance (sitting and/or standing);Decreased safety awareness;Decreased knowledge of use of DME or AE;Decreased knowledge of precautions;Obesity;Pain      OT Treatment/Interventions: Self-care/ADL training;Therapeutic activities;Patient/family education;Balance training;DME and/or AE instruction    OT Goals(Current goals can be found in the care plan section) Acute Rehab OT Goals Patient Stated Goal: to have less pain  OT Goal Formulation: With patient Time For Goal Achievement: 10/20/19 Potential to Achieve Goals: Good ADL Goals Pt Will Perform Grooming: with supervision;standing Pt Will Perform Lower Body Bathing: with supervision;sit to/from stand;with adaptive equipment Pt Will Perform Lower Body Dressing: with supervision;with adaptive equipment;sit to/from stand Pt Will Transfer to Toilet: with supervision;ambulating;regular height toilet;grab bars Pt Will Perform Toileting - Clothing Manipulation and hygiene: with supervision;with adaptive equipment;sit to/from stand Pt Will Perform Tub/Shower Transfer:  Tub transfer;with min guard assist;ambulating;shower seat;rolling walker;grab bars  OT Frequency: Min 2X/week   Barriers to D/C:            Co-evaluation              AM-PAC OT "6 Clicks" Daily Activity     Outcome Measure Help from another person eating meals?: None Help from another person taking care of personal grooming?: A Little Help from another person toileting, which includes using toliet, bedpan, or urinal?: A Lot Help from another person bathing (including washing, rinsing, drying)?: A Lot Help from another person to put on and taking off regular upper body clothing?: A Little Help from another person to put on and taking off regular lower body clothing?: A Lot 6 Click Score: 16   End of Session Equipment Utilized During Treatment: Rolling walker;Back brace Nurse Communication: Mobility status;Patient requests pain meds  Activity Tolerance: Patient limited by pain Patient left: in bed;with call bell/phone within reach  OT Visit Diagnosis: Unsteadiness on feet (R26.81);Pain Pain - part of body:  (back )                Time: 2683-4196 OT Time Calculation (min): 18 min Charges:     Nilsa Nutting OTR/L Acute Rehabilitation Services Pager [phone] Office [place], [place] 10/06/2019, 9:47 AM

## 2019-10-06 NOTE — Evaluation (Signed)
Physical Therapy Evaluation Patient Details Name: Tamara Mccullough MRN: [id number] DOB: 1955/05/26 Today's Date: 10/06/2019   History of Present Illness  Pt is a 64 y/o female who presents s/p L5-S1 redo compressive laminotomies and foraminotomies as well as L5-S1 PLIF. PMH significant for seizures, schizophrenia, panic attacks, morbid obesity s/p gastric bypass surgery, bipolar disorder.     Clinical Impression  Pt admitted with above diagnosis. At the time of PT eval, pt with reported 10/10 headache pain and could not tolerate OOB mobility. Upon sitting up, pt holding her head and squeezing it with her hands. Pt insistent that the headache is from her pain medication and requests it be changed - explained to pt she would need to talk to the MD or nurse regarding this, and RN notified. Attempted education on precautions, brace application/wearing schedule, and benefits to working with physical therapy. Pt currently with functional limitations due to the deficits listed below (see PT Problem List). Pt will benefit from skilled PT to increase their independence and safety with mobility to allow discharge to the venue listed below.      Follow Up Recommendations Home health PT;Supervision for mobility/OOB    Equipment Recommendations  None recommended by PT    Recommendations for Other Services       Precautions / Restrictions Precautions Precautions: Fall;Back Precaution Booklet Issued: Yes (comment) Precaution Comments: Brought patient a new handout as she had spilled liquid on the one issued by OT. Reviewed precautions vebrally during transition to/from EOB as pt with difficulty focusing due to headache.  Required Braces or Orthoses: Spinal Brace Spinal Brace: Lumbar corset;Applied in sitting position Restrictions Weight Bearing Restrictions: No      Mobility  Bed Mobility Overal bed mobility: Needs Assistance Bed Mobility: Rolling;Sit to Sidelying;Sidelying to Sit Rolling:  Supervision Sidelying to sit: Supervision     Sit to sidelying: Supervision General bed mobility comments: VC's for proper log roll technique and to minimize bridging to scoot and readjust her hips in the bed. Pt transitioned to/from EOB only due to headache.   Transfers Overall transfer level: Needs assistance Equipment used: Rolling walker (2 wheeled) Transfers: Sit to/from [facility] Sit to Stand: Min guard Stand pivot transfers: Min guard       General transfer comment: Deferered due to headache  Ambulation/Gait             General Gait Details: Deferred due to headache  Stairs            Wheelchair Mobility    Modified Rankin (Stroke Patients Only)       Balance Overall balance assessment: Needs assistance Sitting-balance support: Feet supported;No upper extremity supported Sitting balance-Leahy Scale: Fair Sitting balance - Comments: Pt sitting EOB without UE support and without truncal LOB.                                     Pertinent Vitals/Pain Pain Assessment: 0-10 Pain Score: 10-Worst pain ever Faces Pain Scale: Hurts even more Pain Location: headache  Pain Descriptors / Indicators: Headache;Grimacing;Guarding;Moaning Pain Intervention(s): Limited activity within patient's tolerance;Monitored during session;Repositioned    Home Living Family/patient expects to be discharged to:: Private residence Living Arrangements: Non-relatives/Friends Available Help at Discharge: Family;Available PRN/intermittently Type of Home: House Home Access: Stairs to enter Entrance Stairs-Rails: Right Entrance Stairs-Number of Steps: 1 Home Layout: One level Home Equipment: Walker - 2 wheels;Walker - 4 wheels;Cane - single  point;Bedside commode;Shower seat;Grab bars - tub/shower Additional Comments: lives with a lady who has Alzheimer's who can assist her physically     Prior Function Level of Independence: Independent with  assistive device(s)               Hand Dominance   Dominant Hand: Right    Extremity/Trunk Assessment   Upper Extremity Assessment Upper Extremity Assessment: Defer to OT evaluation    Lower Extremity Assessment Lower Extremity Assessment:  (Unable to assess - limited eval)    Cervical / Trunk Assessment Cervical / Trunk Assessment: Other exceptions Cervical / Trunk Exceptions: s/p lumbar fusion and morbid obesity   Communication   Communication: No difficulties  Cognition Arousal/Alertness: Awake/alert Behavior During Therapy: Anxious;WFL for tasks assessed/performed Overall Cognitive Status: Within Functional Limits for tasks assessed                                        General Comments      Exercises     Assessment/Plan    PT Assessment Patient needs continued PT services  PT Problem List Decreased strength;Decreased activity tolerance;Decreased balance;Decreased mobility;Decreased knowledge of use of DME;Decreased safety awareness;Decreased knowledge of precautions;Pain       PT Treatment Interventions DME instruction;Gait training;Functional mobility training;Therapeutic activities;Therapeutic exercise;Neuromuscular re-education;Patient/family education    PT Goals (Current goals can be found in the Care Plan section)  Acute Rehab PT Goals Patient Stated Goal: Get rid of headache, change pain medication PT Goal Formulation: With patient Time For Goal Achievement: 10/13/19 Potential to Achieve Goals: Good    Frequency Min 5X/week   Barriers to discharge        Co-evaluation               AM-PAC PT "6 Clicks" Mobility  Outcome Measure Help needed turning from your back to your side while in a flat bed without using bedrails?: None Help needed moving from lying on your back to sitting on the side of a flat bed without using bedrails?: None Help needed moving to and from a bed to a chair (including a wheelchair)?: A  Little Help needed standing up from a chair using your arms (e.g., wheelchair or bedside chair)?: A Little Help needed to walk in hospital room?: A Little Help needed climbing 3-5 steps with a railing? : A Little 6 Click Score: 20    End of Session Equipment Utilized During Treatment: Gait belt Activity Tolerance: Patient limited by pain Patient left: in bed;with call bell/phone within reach Nurse Communication: Mobility status;Patient requests pain meds PT Visit Diagnosis: Unsteadiness on feet (R26.81);Pain;Other symptoms and signs involving the nervous system (R29.898) Pain - part of body:  (headache; back)    Time: 6160-7371 PT Time Calculation (min) (ACUTE ONLY): 12 min   Charges:   PT Evaluation $PT Eval Low Complexity: 1 Low          Rolinda Roan, PT, DPT Acute Rehabilitation Services Pager: [phone] Office: [phone]   Thelma Comp 10/06/2019, 12:09 PM

## 2019-10-06 NOTE — Progress Notes (Signed)
Pt has been c/o severe HA all day. Pt states its very intense when she ambulates. Pt has been flat in bed most of the day other than going to the bathroom. I notified Meagan, NP and new orders were given. Will continue to monitor Pt. Holli Humbles, RN

## 2019-10-07 DIAGNOSIS — M48061 Spinal stenosis, lumbar region without neurogenic claudication: Secondary | ICD-10-CM | POA: Diagnosis not present

## 2019-10-07 MED ORDER — TRAMADOL HCL 50 MG PO TABS: 50.0000 mg | ORAL_TABLET | Freq: Four times a day (QID) | ORAL | 0 refills | Status: AC | PRN

## 2019-10-07 MED ORDER — BUTALBITAL-APAP-CAFFEINE 50-325-40 MG PO TABS: 1.0000 | ORAL_TABLET | ORAL | 0 refills | Status: DC | PRN

## 2019-10-07 MED ORDER — TIZANIDINE HCL 4 MG PO TABS: 4.0000 mg | ORAL_TABLET | Freq: Four times a day (QID) | ORAL | 1 refills | Status: DC | PRN

## 2019-10-07 MED ORDER — SCOPOLAMINE 1 MG/3DAYS TD PT72: 1.0000 | MEDICATED_PATCH | TRANSDERMAL | 0 refills | Status: DC

## 2019-10-07 NOTE — TOC Transition Note (Signed)
Transition of Care [facility]) - CM/SW Discharge Note   Patient Details  Name: Tamara Mccullough MRN: [id number] Date of Birth: 1955/03/11  [facility]) CM/SW Contact:  Angelita Ingles, RN Phone Number: [phone]  10/07/2019, 10:48 AM   Clinical Narrative:    CM consulted for [facility] services. Patient offered choice. HH has been set up with [facility]. [place] Go to call patient to set up start of services. CM will sign off   Final next level of care: Home w Home Health Services Barriers to Discharge: No Barriers Identified   Patient Goals and CMS Choice Patient states their goals for this hospitalization and ongoing recovery are:: Patient is ready to go home CMS Medicare.gov Compare Post Acute Care list provided to:: Patient Choice offered to / list presented to : Patient  Discharge Placement                       Discharge Plan and Services                DME Arranged: N/A (DME arranged directly through the unit nurse) DME Agency: NA       HH Arranged: PT, OT, Nurse's Aide [place] Agency: [facility] ([facility]) Date [place]: 10/07/19 Time [place]: 24 Representative spoke with at [place]: [place]  Social Determinants of Health ([place]) Interventions     Readmission Risk Interventions No flowsheet data found.

## 2019-10-07 NOTE — Progress Notes (Signed)
Physical Therapy Treatment Patient Details Name: Tamara Mccullough MRN: [id number] DOB: 1955/11/18 Today's Date: 10/07/2019    History of Present Illness Pt is a 64 y/o female who presents s/p L5-S1 redo compressive laminotomies and foraminotomies as well as L5-S1 PLIF. PMH significant for seizures, schizophrenia, panic attacks, morbid obesity s/p gastric bypass surgery, bipolar disorder.     PT Comments    The pt was able to make good progress with functional mobility today as her pain was better controlled this morning compared to prior therapy session. The pt was able to demo good stability with use of a RW for hallway ambulation without need for physical assist. The pt does continue to demo deficits in functional strength and coordination of mobility to allow for good application of spinal precautions to bed mobility without assist, as well as general activity tolerance and endurance. The pt will continue to benefit from skilled PT to progress these deficits and facilitate safe return to prior level of independence following d/c.     Follow Up Recommendations  Home health PT;Supervision for mobility/OOB     Equipment Recommendations  None recommended by PT    Recommendations for Other Services       Precautions / Restrictions Precautions Precautions: Fall;Back Precaution Booklet Issued: Yes (comment) Precaution Comments: pt able to state back precautions with appropriate demonstration with bed mobility Required Braces or Orthoses: Spinal Brace Spinal Brace: Lumbar corset;Applied in sitting position Restrictions Weight Bearing Restrictions: No    Mobility  Bed Mobility Overal bed mobility: Needs Assistance Bed Mobility: Rolling;Sit to Sidelying;Sidelying to Sit Rolling: Supervision Sidelying to sit: Supervision     Sit to sidelying: Supervision General bed mobility comments: VC's for proper log roll technique and to minimize bridging to scoot and readjust her hips in the  bed. completed x2 in session for improved technique. flat bed. used rails  Transfers Overall transfer level: Needs assistance Equipment used: Rolling walker (2 wheeled);None Transfers: Sit to/from Stand Sit to Stand: Supervision         General transfer comment: the pt was able to stand with good stability and supervision only both with and without a RW.  Ambulation/Gait Ambulation/Gait assistance: Min guard Gait Distance (Feet): 150 Feet Assistive device: Rolling walker (2 wheeled) Gait Pattern/deviations: Step-through pattern;Trunk flexed;Decreased stride length Gait velocity: 0.44 m/s Gait velocity interpretation: <1.8 ft/sec, indicate of risk for recurrent falls General Gait Details: slight trunk flexion and heavy relaince on RW. step through with decreased stride lenght     Balance Overall balance assessment: Needs assistance Sitting-balance support: Feet supported;No upper extremity supported Sitting balance-Leahy Scale: Fair Sitting balance - Comments: Pt sitting EOB without UE support and without truncal LOB.   Standing balance support: No upper extremity supported;During functional activity Standing balance-Leahy Scale: Fair Standing balance comment: pt able to take small steps at EOB without UE support, BUE support for contineud walking                            Cognition Arousal/Alertness: Awake/alert Behavior During Therapy: Anxious;WFL for tasks assessed/performed Overall Cognitive Status: Within Functional Limits for tasks assessed                                 General Comments: anxious about pain, but redirectable and agreeable      Exercises      General Comments  Pertinent Vitals/Pain Pain Assessment: Faces Pain Score: 7  Faces Pain Scale: Hurts worst Pain Location: headache  Pain Descriptors / Indicators: Headache;Grimacing;Guarding;Moaning;Dull Pain Intervention(s): Limited activity within patient's  tolerance;Monitored during session;Repositioned           PT Goals (current goals can now be found in the care plan section) Acute Rehab PT Goals Patient Stated Goal: Get rid of headache PT Goal Formulation: With patient Time For Goal Achievement: 10/13/19 Potential to Achieve Goals: Good    Frequency    Min 5X/week      PT Plan         AM-PAC PT "6 Clicks" Mobility   Outcome Measure  Help needed turning from your back to your side while in a flat bed without using bedrails?: None Help needed moving from lying on your back to sitting on the side of a flat bed without using bedrails?: None Help needed moving to and from a bed to a chair (including a wheelchair)?: A Little Help needed standing up from a chair using your arms (e.g., wheelchair or bedside chair)?: A Little Help needed to walk in hospital room?: A Little Help needed climbing 3-5 steps with a railing? : A Little 6 Click Score: 20    End of Session Equipment Utilized During Treatment: Gait belt Activity Tolerance: Patient limited by pain Patient left: in bed;with call bell/phone within reach Nurse Communication: Mobility status PT Visit Diagnosis: Unsteadiness on feet (R26.81);Pain;Other symptoms and signs involving the nervous system (R29.898) Pain - part of body:  (back, headache)     Time: 8309-4076 PT Time Calculation (min) (ACUTE ONLY): 15 min  Charges:  $Gait Training: 8-22 mins                     Karma Ganja, PT, DPT   Acute Rehabilitation Department Pager #: [phone]   Otho Bellows 10/07/2019, 9:49 AM

## 2019-10-07 NOTE — Discharge Summary (Signed)
Physician Discharge Summary     Providing Compassionate, Quality Care - Together   Patient ID: Tamara Mccullough MRN: [id number] DOB/AGE: 03/25/1955 64 y.o.  Admit date: 10/05/2019 Discharge date: 10/07/2019  Admission Diagnoses: Lumbar foraminal stenosis  Discharge Diagnoses:  Active Problems:   Lumbar foraminal stenosis   Discharged Condition: good  Hospital Course: Patient underwent an L5-S1 posterior lumbar interbody fusion by Dr. Annette Stable on 10/05/2019. She was admitted to 3C08 after recovering from anesthesia in the PACU. Her postoperative course has been uncomplicated. She did develop headaches following surgery. These have responded well to Fioricet. She has worked with both physical and occupational therapies who are recommending home health PT and OT at discharge. He is ambulating independently and without difficulty. She is tolerating a normal diet. She is not having any bowel or bladder dysfunction. Her pain is well-controlled with oral pain medication. She is ready for discharge home.   Consults: rehabilitation medicine  Significant Diagnostic Studies: radiology: DG Lumbar Spine 2-3 Views  Result Date: 10/05/2019 CLINICAL DATA:  Provided history: Elective surgery. L5-S1 PLIF. Provided fluoroscopy time 41 seconds, 73.02 mGy EXAM: LUMBAR SPINE - 2-3 VIEW; DG C-ARM 1-60 MIN COMPARISON:  Radiographs of the lumbar spine 03/04/2019. Lumbar spine MRI 03/02/2019 FINDINGS: PA and lateral view intraoperative fluoroscopic images of the lumbosacral spine are submitted, 2 images total. The images demonstrate bilateral pedicle screws as well as an interbody spacer at L5-S1. Vertical interconnecting rods are not present at the time the images were taken. There are overlying soft tissue retractors. IMPRESSION: Two intraoperative fluoroscopic images of the lumbosacral spine from L5-S1 PLIF as described. Electronically Signed   By: Kellie Simmering DO   On: 10/05/2019 14:11   DG C-Arm 1-60  Min  Result Date: 10/05/2019 CLINICAL DATA:  Provided history: Elective surgery. L5-S1 PLIF. Provided fluoroscopy time 41 seconds, 73.02 mGy EXAM: LUMBAR SPINE - 2-3 VIEW; DG C-ARM 1-60 MIN COMPARISON:  Radiographs of the lumbar spine 03/04/2019. Lumbar spine MRI 03/02/2019 FINDINGS: PA and lateral view intraoperative fluoroscopic images of the lumbosacral spine are submitted, 2 images total. The images demonstrate bilateral pedicle screws as well as an interbody spacer at L5-S1. Vertical interconnecting rods are not present at the time the images were taken. There are overlying soft tissue retractors. IMPRESSION: Two intraoperative fluoroscopic images of the lumbosacral spine from L5-S1 PLIF as described. Electronically Signed   By: Kellie Simmering DO   On: 10/05/2019 14:11     Treatments: surgery:   1) L5-S1 redo decompressive laminotomies and foraminotomies, more than would be required for simple interbody fusion alone.  2) L5-S1 posterior lumbar interbody fusion utilizing interbody cages and locally harvested autograft  3) L5-S1 posterior lateral arthrodesis utilizing nonsegmental pedicle screw fixation and local autograft  Discharge Exam: Blood pressure 124/63, pulse 83, temperature 97.6 F (36.4 C), temperature source Oral, resp. rate 18, height 5\' 3"  (1.6 m), weight 108.9 kg, SpO2 97 %.   Alert and oriented x 4 PERRLA CN II-XII grossly intact MAE, Strength and sensation intact Incision is covered with Honeycomb dressing and Steri Strips; Dressing is dry and intact with a small amount of sanguinous drainage.   Disposition: Discharge disposition: 01-Home or Self Care       Discharge Instructions    Face-to-face encounter (required for Medicare/Medicaid patients)   Complete by: As directed    I Donta Nettle certify that this patient is under my care and that I, or a nurse practitioner or physician's assistant working with me, had  a face-to-face encounter that meets the  physician face-to-face encounter requirements with this patient on 10/07/2019. The encounter with the patient was in whole, or in part for the following medical condition(s) which is the primary reason for home health care (List medical condition): L5-S1 foraminal stenosis with radiculopathy, s/p L5-S1 PLIF   The encounter with the patient was in whole, or in part, for the following medical condition, which is the primary reason for home health care: L5-S1 foraminal stenosis with radiculopathy, s/p I9-S8 PLIF   I certify that, based on my findings, the following services are medically necessary home health services: Physical therapy   Reason for Medically Necessary Home Health Services: Therapy- Therapeutic Exercises to Increase Strength and Endurance   My clinical findings support the need for the above services: Unable to leave home safely without assistance and/or assistive device   Further, I certify that my clinical findings support that this patient is homebound due to: Unable to leave home safely without assistance   Home Health   Complete by: As directed    To provide the following care/treatments:  PT OT Home Health Aide       Allergies as of 10/07/2019      Reactions   Codeine Itching, Nausea And Vomiting, Rash, Other (See Comments)   Melatonin Other (See Comments)   seizure   Ambien [zolpidem Tartrate] Other (See Comments)   forgetful   Eszopiclone Rash   Lansoprazole Palpitations   Mirapex [pramipexole] Other (See Comments)   Mouth dry   Motrin [ibuprofen] Nausea And Vomiting   Prilosec [omeprazole] Palpitations, Other (See Comments)   Makes heart beat fast.   Trazodone And Nefazodone Other (See Comments)   fainted      Medication List    TAKE these medications   acetaminophen 500 MG tablet Commonly known as: TYLENOL Take 1,000 mg by mouth every 4 (four) hours as needed for moderate pain.   ALPRAZolam 0.25 MG tablet Commonly known as: XANAX Take 0.25 mg by mouth at  bedtime.   aspirin EC 81 MG tablet Take 81 mg by mouth daily.   busPIRone 10 MG tablet Commonly known as: BUSPAR Take 2 tablets (20 mg total) by mouth 2 (two) times daily.   butalbital-acetaminophen-caffeine 50-325-40 MG tablet Commonly known as: FIORICET Take 1 tablet by mouth every 4 (four) hours as needed for headache.   diclofenac sodium 1 % Gel Commonly known as: VOLTAREN Apply 4 g topically 4 (four) times daily.   FLUoxetine 40 MG capsule Commonly known as: PROZAC Take 1 capsule (40 mg total) by mouth daily.   fluticasone 50 MCG/ACT nasal spray Commonly known as: FLONASE Place 1 spray into both nostrils daily as needed (congestion).   gabapentin 300 MG capsule Commonly known as: NEURONTIN Take 300 mg by mouth daily at 6 PM.   hydrochlorothiazide 12.5 MG capsule Commonly known as: Microzide Take 1 capsule (12.5 mg total) by mouth daily.   hydrOXYzine 50 MG tablet Commonly known as: ATARAX/VISTARIL Take 1 tablet (50 mg total) by mouth 3 (three) times daily as needed. What changed:   how much to take  when to take this   lamoTRIgine 100 MG tablet Commonly known as: LAMICTAL Take 1 tablet (100 mg total) by mouth daily.   QUEtiapine 100 MG tablet Commonly known as: SEROQUEL Take 1 tablet (100 mg total) by mouth at bedtime.   scopolamine 1 MG/3DAYS Commonly known as: Transderm-Scop (1.5 MG) Place 1 patch (1.5 mg total) onto the skin every 3 (  three) days.   tiZANidine 4 MG tablet Commonly known as: Zanaflex Take 1 tablet (4 mg total) by mouth every 6 (six) hours as needed for muscle spasms.   traMADol 50 MG tablet Commonly known as: ULTRAM Take 1-2 tablets (50-100 mg total) by mouth every 6 (six) hours as needed for severe pain.   Vitamin D (Ergocalciferol) 1.25 MG (50000 UNIT) Caps capsule Commonly known as: DRISDOL Take 50,000 Units by mouth every 30 (thirty) days.            Durable Medical Equipment  (From admission, onward)         Start      Ordered   10/05/19 1544  DME Walker rolling  Once       Question:  Patient needs a walker to treat with the following condition  Answer:  Lumbar foraminal stenosis   10/05/19 1543   10/05/19 1544  DME 3 n 1  Once        10/05/19 1543           Signed: Lilianne Nettle 10/07/2019, 9:50 AM

## 2019-10-07 NOTE — Progress Notes (Signed)
Patient is discharged from room 3C08 at this time. Alert and in stable condition. IV site d/c'd and instructions read to patient with understanding verbalized and all questions answered. Left unit via wheelchair with all belongings at side. 

## 2019-10-07 NOTE — Progress Notes (Signed)
Occupational Therapy Treatment Patient Details Name: Tamara Mccullough MRN: [id number] DOB: Feb 04, 1956 Today's Date: 10/07/2019    History of present illness Pt is a 64 y/o female who presents s/p L5-S1 redo compressive laminotomies and foraminotomies as well as L5-S1 PLIF. PMH significant for seizures, schizophrenia, panic attacks, morbid obesity s/p gastric bypass surgery, bipolar disorder.    OT comments  Patient continues to make progress towards goals in skilled OT session. Patient's session encompassed EOB education with regard to AE and appropriate oral care in order to heed to back precautions, with increased mobility deferred due to 10/10 headache when sitting EOB. Pt tremulous in BUEs due to pain, but able to appropriately state back precautions and acknowledge how to use AE for lower body ADLs. Discharge remains appropriate, will continue to follow acutely.    Follow Up Recommendations  Home health OT;Supervision - Intermittent    Equipment Recommendations  None recommended by OT    Recommendations for Other Services      Precautions / Restrictions Precautions Precautions: Fall;Back Precaution Booklet Issued: Yes (comment) Precaution Comments: pt able to state back precautions with appropriate demonstration with bed mobility Required Braces or Orthoses: Spinal Brace Spinal Brace: Lumbar corset;Applied in sitting position Restrictions Weight Bearing Restrictions: No       Mobility Bed Mobility Overal bed mobility: Needs Assistance Bed Mobility: Rolling;Sit to Sidelying;Sidelying to Sit Rolling: Supervision Sidelying to sit: Supervision     Sit to sidelying: Supervision General bed mobility comments: Able to demonstrate technique with no VC's needed, reliance on bed rails noted  Transfers Overall transfer level: Needs assistance Equipment used: Rolling walker (2 wheeled);None Transfers: Sit to/from Stand Sit to Stand: Supervision         General transfer  comment: Deferred due to headache    Balance Overall balance assessment: Needs assistance Sitting-balance support: Feet supported;No upper extremity supported Sitting balance-Leahy Scale: Fair Sitting balance - Comments: Pt sitting EOB without UE support and without truncal LOB.   Standing balance support: No upper extremity supported;During functional activity Standing balance-Leahy Scale: Fair Standing balance comment: Deferred due to 10/10 HA                           ADL either performed or assessed with clinical judgement   ADL Overall ADL's : Needs assistance/impaired     Grooming: Oral care;Sitting;Set up                                 General ADL Comments: Session focus on AE demonstration and oral care demonstration, pt neglecting to get up and complete due to severe HA throughout     Vision       Perception     Praxis      Cognition Arousal/Alertness: Awake/alert Behavior During Therapy: Anxious;WFL for tasks assessed/performed Overall Cognitive Status: Within Functional Limits for tasks assessed                                 General Comments: anxious about pain, but redirectable and agreeable        Exercises     Shoulder Instructions       General Comments      Pertinent Vitals/ Pain       Pain Assessment: Faces Pain Score: 7  Faces Pain Scale: Hurts worst Pain Location: headache  Pain Descriptors / Indicators: Headache;Grimacing;Guarding;Moaning;Dull Pain Intervention(s): Limited activity within patient's tolerance;Monitored during session;Repositioned  Home Living                                          Prior Functioning/Environment              Frequency  Min 2X/week        Progress Toward Goals  OT Goals(current goals can now be found in the care plan section)  Progress towards OT goals: Progressing toward goals  Acute Rehab OT Goals Patient Stated Goal: Get rid  of headache OT Goal Formulation: With patient Time For Goal Achievement: 10/20/19 Potential to Achieve Goals: Good  Plan Discharge plan remains appropriate    Co-evaluation                 AM-PAC OT "6 Clicks" Daily Activity     Outcome Measure   Help from another person eating meals?: None Help from another person taking care of personal grooming?: A Little Help from another person toileting, which includes using toliet, bedpan, or urinal?: A Lot Help from another person bathing (including washing, rinsing, drying)?: A Lot Help from another person to put on and taking off regular upper body clothing?: A Little Help from another person to put on and taking off regular lower body clothing?: A Lot 6 Click Score: 16    End of Session Equipment Utilized During Treatment: Other (comment) (Adaptive equipment)  OT Visit Diagnosis: Unsteadiness on feet (R26.81);Pain Pain - part of body:  (Head)   Activity Tolerance Patient limited by pain   Patient Left in bed;with call bell/phone within reach   Nurse Communication Mobility status        Time: 5638-7564 OT Time Calculation (min): 10 min  Charges: OT General Charges $OT Visit: 1 Visit OT Treatments $Self Care/Home Management : 8-22 mins  Tamara Mccullough, COTA/L Acute Rehabilitation Services [phone]   Tamara Mccullough 10/07/2019, 9:51 AM

## 2019-10-08 MED FILL — Sodium Chloride IV Soln 0.9%: INTRAVENOUS | Qty: 1000 | Status: AC

## 2019-10-08 MED FILL — Heparin Sodium (Porcine) Inj 1000 Unit/ML: INTRAMUSCULAR | Qty: 30 | Status: AC
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
## 2019-11-02 ENCOUNTER — Telehealth (HOSPITAL_COMMUNITY): Payer: Medicare HMO | Admitting: Psychiatry

## 2019-11-02 ENCOUNTER — Telehealth (HOSPITAL_COMMUNITY): Payer: Self-pay | Admitting: Psychiatry

## 2019-11-02 ENCOUNTER — Other Ambulatory Visit: Payer: Self-pay

## 2019-11-02 NOTE — Telephone Encounter (Signed)
Called the patient  twice for appointment scheduled today. The patient did not answer the phone. Left voice message to contact the office.  
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
## 2019-11-03 ENCOUNTER — Telehealth (HOSPITAL_COMMUNITY): Payer: Medicare HMO | Admitting: Psychiatry

## 2019-11-08 ENCOUNTER — Other Ambulatory Visit (HOSPITAL_COMMUNITY): Payer: Self-pay | Admitting: Psychiatry

## 2019-11-08 MED ORDER — FLUOXETINE HCL 40 MG PO CAPS
40.0000 mg | ORAL_CAPSULE | Freq: Every day | ORAL | 0 refills | Status: DC
Start: 2019-11-08 — End: 2020-02-14
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
## 2019-11-18 ENCOUNTER — Other Ambulatory Visit (HOSPITAL_COMMUNITY): Payer: Self-pay | Admitting: Psychiatry

## 2019-11-18 DIAGNOSIS — F0630 Mood disorder due to known physiological condition, unspecified: Secondary | ICD-10-CM

## 2019-11-18 MED ORDER — HYDROXYZINE HCL 50 MG PO TABS: 50.0000 mg | ORAL_TABLET | Freq: Three times a day (TID) | ORAL | 0 refills | Status: DC | PRN

## 2019-11-22 ENCOUNTER — Telehealth (INDEPENDENT_AMBULATORY_CARE_PROVIDER_SITE_OTHER): Payer: Medicare HMO | Admitting: Psychiatry

## 2019-11-22 ENCOUNTER — Encounter (HOSPITAL_COMMUNITY): Payer: Self-pay | Admitting: Psychiatry

## 2019-11-22 ENCOUNTER — Other Ambulatory Visit: Payer: Self-pay

## 2019-11-22 DIAGNOSIS — F3341 Major depressive disorder, recurrent, in partial remission: Secondary | ICD-10-CM | POA: Diagnosis not present

## 2019-11-22 DIAGNOSIS — F0630 Mood disorder due to known physiological condition, unspecified: Secondary | ICD-10-CM

## 2019-11-22 DIAGNOSIS — F4310 Post-traumatic stress disorder, unspecified: Secondary | ICD-10-CM | POA: Diagnosis not present

## 2019-11-22 MED ORDER — QUETIAPINE FUMARATE 100 MG PO TABS: 100.0000 mg | ORAL_TABLET | Freq: Every day | ORAL | 0 refills | Status: DC

## 2019-11-22 MED ORDER — BUSPIRONE HCL 10 MG PO TABS: 20.0000 mg | ORAL_TABLET | Freq: Two times a day (BID) | ORAL | 0 refills | Status: DC

## 2019-11-22 MED ORDER — LAMOTRIGINE 100 MG PO TABS
100.0000 mg | ORAL_TABLET | Freq: Every day | ORAL | 0 refills | Status: DC
Start: 2019-11-09 — End: 2020-02-14

## 2019-11-22 NOTE — Patient Instructions (Signed)
1.Continuefluoxetine40 mg daily 2.Continue Buspar20 mg BID 3.Continuequetiapine100mg  at night 4.Continuelamotrigine 100 mg daily 5 Continuehydroxyzine50 mgtwo to three timesa dayas needed for anxiety 6.Next appointment:1/10 at 1:40

## 2020-01-13 ENCOUNTER — Ambulatory Visit: Payer: Medicare HMO | Admitting: Physical Therapy

## 2020-01-19 ENCOUNTER — Other Ambulatory Visit: Payer: Self-pay

## 2020-01-19 ENCOUNTER — Ambulatory Visit: Payer: Medicare HMO | Attending: Neurosurgery | Admitting: Physical Therapy

## 2020-01-19 ENCOUNTER — Encounter: Payer: Self-pay | Admitting: Physical Therapy

## 2020-01-19 DIAGNOSIS — M5441 Lumbago with sciatica, right side: Secondary | ICD-10-CM | POA: Insufficient documentation

## 2020-01-19 DIAGNOSIS — G8929 Other chronic pain: Secondary | ICD-10-CM | POA: Diagnosis present

## 2020-01-19 DIAGNOSIS — M6281 Muscle weakness (generalized): Secondary | ICD-10-CM | POA: Insufficient documentation

## 2020-01-19 NOTE — Therapy (Signed)
[facility] [place], [place], [address] Phone: [phone]   Fax:  [phone]  Physical Therapy Evaluation  Patient Details  Name: Tamara Mccullough MRN: [id number] Date of Birth: April 22, 1955 Referring Provider (PT): Earnie Larsson MD   Encounter Date: 01/19/2020   PT End of Session - 01/19/20 1323    Visit Number 1    Number of Visits 12    Date for PT Re-Evaluation 03/01/20    PT Start Time 9485    PT Stop Time 1310    PT Time Calculation (min) 39 min    Activity Tolerance Patient tolerated treatment well    Behavior During Therapy [facility] for tasks assessed/performed           Past Medical History:  Diagnosis Date  . Allergy   . Anemia   . Arthritis    back   . Asthma    h/o  . Bipolar disorder ([place])   . Chronic back pain   . Depression   . Depression   . Dyspnea    on exertion  . Edema   . Hyperlipidemia   . Hypertension   . Insomnia   . Morbid obesity ([place])   . Neuromuscular disorder ([place])   . Overdose of sleeping tabs 03/10/2013  . Panic attacks   . Pneumonia    h/o  . PONV (postoperative nausea and vomiting)   . Restless leg syndrome   . Schizophrenia ([place])   . Seizures ([place])    hx of seizure 05/2013; last seizure April 2019  . Sleep apnea    has not used CPAP in 3 years- not using CPAP  . Thyroid disease     Past Surgical History:  Procedure Laterality Date  . ANTERIOR CERVICAL DECOMP/DISCECTOMY FUSION N/A 11/09/2013   Procedure:  Anterior cervical decompression/diskectomy/fusion cervical four-five ,cervical five-six,cervical six-seven;  Surgeon: Floyce Stakes, MD;  Location: [facility];  Service: Neurosurgery;  Laterality: N/A;  . BACK SURGERY    . COLONOSCOPY  2011  . CTR    . gastric by pass  6/11  . MINOR HEMORRHOIDECTOMY    . SPINE SURGERY    . THYROID LOBECTOMY Right 08/12/2013   Procedure: RIGHT THYROID LOBECTOMY;  Surgeon: Odis Hollingshead, MD;  Location: [facility];  Service:  General;  Laterality: Right;  . TUBAL LIGATION      There were no vitals filed for this visit.    Subjective Assessment - 01/19/20 1301    Subjective COVID-19 screen performed prior to patient entering clinic.  The patient presents to the clinic today with a long h/o spinal pain and s/p lumbar L5-S1 fusion performed on 10/05/19.  She states that after suregry she was very weak and had difficulty walking.  She is walking with a straight cane today but has a FWW and it is recommended that she use this (FWW) as it will provide her more stability and safety.  Her pain-level is a 6/10 but can rise to [place] higher levels with increased activity.  She alos reports numbness from her right anterior knee to her foot.  She reports one fall over the last 6 months when she was bending forward.  Resting decreases her pain and walking increases her pain.    Pertinent History "5 back surgeries", HTN, thyroid surgery, CTR, ACDF, bipolar.    Limitations Walking    How long can you walk comfortably? Around home.    Patient Stated Goals Get stronger and walk better.  Problem List Patient Active Problem List   Diagnosis Date Noted  . PTSD (post-traumatic stress disorder) 10/14/2017  . Seizure ([place]) 12/13/2016  . Lumbar disc disorder 11/07/2016  . Gastric bypass status for obesity 08/22/2016  . Seizures ([place]) 09/01/2015  . Lumbar foraminal stenosis 07/17/2015  . OA (osteoarthritis) of knee 07/17/2015  . Alcohol use disorder, severe, dependence ([place])   . Insomnia due to anxiety and fear 12/20/2013  . Cervical stenosis of spinal canal 11/09/2013  . RLS (restless legs syndrome) 08/09/2013  . Multinodular goiter 06/09/2013  . Alcoholism in remission ([place]) 03/15/2013  . Essential hypertension, benign 05/20/2012  . GAD (generalized anxiety disorder) 05/20/2012  . Hyperlipidemia with target LDL less than 100 05/20/2012  . Mood disorder in conditions classified elsewhere 05/20/2012  . Anemia, vitamin  B12 deficiency 07/06/2009  . Obesity 06/29/2009  . PERSONAL HX COLONIC POLYPS 06/29/2009    Arion Morgan, [place] MPT 01/19/2020, 1:45 PM  [facility] [address] [place], [place], [address] Phone: [phone]   Fax:  [phone]  Name: Tamara Mccullough MRN: [id number] Date of Birth: 1955/04/30  [facility] [place], [place], [address] Phone: [phone]   Fax:  [phone]  Physical Therapy Evaluation  Patient Details  Name: Tamara Mccullough MRN: [id number] Date of Birth: April 22, 1955 Referring Provider (PT): Earnie Larsson MD   Encounter Date: 01/19/2020   PT End of Session - 01/19/20 1323    Visit Number 1    Number of Visits 12    Date for PT Re-Evaluation 03/01/20    PT Start Time 9485    PT Stop Time 1310    PT Time Calculation (min) 39 min    Activity Tolerance Patient tolerated treatment well    Behavior During Therapy [facility] for tasks assessed/performed           Past Medical History:  Diagnosis Date  . Allergy   . Anemia   . Arthritis    back   . Asthma    h/o  . Bipolar disorder ([place])   . Chronic back pain   . Depression   . Depression   . Dyspnea    on exertion  . Edema   . Hyperlipidemia   . Hypertension   . Insomnia   . Morbid obesity ([place])   . Neuromuscular disorder ([place])   . Overdose of sleeping tabs 03/10/2013  . Panic attacks   . Pneumonia    h/o  . PONV (postoperative nausea and vomiting)   . Restless leg syndrome   . Schizophrenia ([place])   . Seizures ([place])    hx of seizure 05/2013; last seizure April 2019  . Sleep apnea    has not used CPAP in 3 years- not using CPAP  . Thyroid disease     Past Surgical History:  Procedure Laterality Date  . ANTERIOR CERVICAL DECOMP/DISCECTOMY FUSION N/A 11/09/2013   Procedure:  Anterior cervical decompression/diskectomy/fusion cervical four-five ,cervical five-six,cervical six-seven;  Surgeon: Floyce Stakes, MD;  Location: [facility];  Service: Neurosurgery;  Laterality: N/A;  . BACK SURGERY    . COLONOSCOPY  2011  . CTR    . gastric by pass  6/11  . MINOR HEMORRHOIDECTOMY    . SPINE SURGERY    . THYROID LOBECTOMY Right 08/12/2013   Procedure: RIGHT THYROID LOBECTOMY;  Surgeon: Odis Hollingshead, MD;  Location: [facility];  Service:  General;  Laterality: Right;  . TUBAL LIGATION      There were no vitals filed for this visit.    Subjective Assessment - 01/19/20 1301    Subjective COVID-19 screen performed prior to patient entering clinic.  The patient presents to the clinic today with a long h/o spinal pain and s/p lumbar L5-S1 fusion performed on 10/05/19.  She states that after suregry she was very weak and had difficulty walking.  She is walking with a straight cane today but has a FWW and it is recommended that she use this (FWW) as it will provide her more stability and safety.  Her pain-level is a 6/10 but can rise to [place] higher levels with increased activity.  She alos reports numbness from her right anterior knee to her foot.  She reports one fall over the last 6 months when she was bending forward.  Resting decreases her pain and walking increases her pain.    Pertinent History "5 back surgeries", HTN, thyroid surgery, CTR, ACDF, bipolar.    Limitations Walking    How long can you walk comfortably? Around home.    Patient Stated Goals Get stronger and walk better.  Problem List Patient Active Problem List   Diagnosis Date Noted  . PTSD (post-traumatic stress disorder) 10/14/2017  . Seizure ([place]) 12/13/2016  . Lumbar disc disorder 11/07/2016  . Gastric bypass status for obesity 08/22/2016  . Seizures ([place]) 09/01/2015  . Lumbar foraminal stenosis 07/17/2015  . OA (osteoarthritis) of knee 07/17/2015  . Alcohol use disorder, severe, dependence ([place])   . Insomnia due to anxiety and fear 12/20/2013  . Cervical stenosis of spinal canal 11/09/2013  . RLS (restless legs syndrome) 08/09/2013  . Multinodular goiter 06/09/2013  . Alcoholism in remission ([place]) 03/15/2013  . Essential hypertension, benign 05/20/2012  . GAD (generalized anxiety disorder) 05/20/2012  . Hyperlipidemia with target LDL less than 100 05/20/2012  . Mood disorder in conditions classified elsewhere 05/20/2012  . Anemia, vitamin  B12 deficiency 07/06/2009  . Obesity 06/29/2009  . PERSONAL HX COLONIC POLYPS 06/29/2009    Arion Morgan, [place] MPT 01/19/2020, 1:45 PM  [facility] [address] [place], [place], [address] Phone: [phone]   Fax:  [phone]  Name: Tamara Mccullough MRN: [id number] Date of Birth: 1955/04/30

## 2020-01-24 ENCOUNTER — Ambulatory Visit: Payer: Medicare HMO | Admitting: Physical Therapy

## 2020-01-25 ENCOUNTER — Ambulatory Visit: Payer: Medicare HMO | Admitting: *Deleted

## 2020-01-25 ENCOUNTER — Other Ambulatory Visit: Payer: Self-pay

## 2020-01-25 ENCOUNTER — Inpatient Hospital Stay (HOSPITAL_COMMUNITY)
Admission: EM | Admit: 2020-01-25 | Discharge: 2020-02-07 | DRG: 558 | Disposition: A | Discharge status: Home / Self Care | Payer: Medicare HMO | Attending: Internal Medicine | Admitting: Internal Medicine

## 2020-01-25 ENCOUNTER — Emergency Department (HOSPITAL_COMMUNITY): Payer: Medicare HMO

## 2020-01-25 DIAGNOSIS — M4807 Spinal stenosis, lumbosacral region: Secondary | ICD-10-CM | POA: Diagnosis present

## 2020-01-25 DIAGNOSIS — F419 Anxiety disorder, unspecified: Secondary | ICD-10-CM | POA: Diagnosis present

## 2020-01-25 DIAGNOSIS — F1020 Alcohol dependence, uncomplicated: Secondary | ICD-10-CM | POA: Diagnosis present

## 2020-01-25 DIAGNOSIS — M5416 Radiculopathy, lumbar region: Secondary | ICD-10-CM | POA: Diagnosis not present

## 2020-01-25 DIAGNOSIS — Z8371 Family history of colonic polyps: Secondary | ICD-10-CM

## 2020-01-25 DIAGNOSIS — M519 Unspecified thoracic, thoracolumbar and lumbosacral intervertebral disc disorder: Secondary | ICD-10-CM

## 2020-01-25 DIAGNOSIS — Z9181 History of falling: Secondary | ICD-10-CM

## 2020-01-25 DIAGNOSIS — Z888 Allergy status to other drugs, medicaments and biological substances status: Secondary | ICD-10-CM | POA: Diagnosis not present

## 2020-01-25 DIAGNOSIS — F32A Depression, unspecified: Secondary | ICD-10-CM | POA: Diagnosis present

## 2020-01-25 DIAGNOSIS — Z8261 Family history of arthritis: Secondary | ICD-10-CM

## 2020-01-25 DIAGNOSIS — R200 Anesthesia of skin: Secondary | ICD-10-CM | POA: Diagnosis present

## 2020-01-25 DIAGNOSIS — R001 Bradycardia, unspecified: Secondary | ICD-10-CM | POA: Diagnosis present

## 2020-01-25 DIAGNOSIS — Z87891 Personal history of nicotine dependence: Secondary | ICD-10-CM

## 2020-01-25 DIAGNOSIS — E440 Moderate protein-calorie malnutrition: Secondary | ICD-10-CM | POA: Insufficient documentation

## 2020-01-25 DIAGNOSIS — G8929 Other chronic pain: Secondary | ICD-10-CM | POA: Diagnosis present

## 2020-01-25 DIAGNOSIS — Z20822 Contact with and (suspected) exposure to covid-19: Secondary | ICD-10-CM | POA: Diagnosis present

## 2020-01-25 DIAGNOSIS — Z7982 Long term (current) use of aspirin: Secondary | ICD-10-CM

## 2020-01-25 DIAGNOSIS — R350 Frequency of micturition: Secondary | ICD-10-CM | POA: Diagnosis not present

## 2020-01-25 DIAGNOSIS — M6282 Rhabdomyolysis: Principal | ICD-10-CM | POA: Diagnosis present

## 2020-01-25 DIAGNOSIS — R296 Repeated falls: Secondary | ICD-10-CM | POA: Diagnosis present

## 2020-01-25 DIAGNOSIS — Z6841 Body Mass Index (BMI) 40.0 and over, adult: Secondary | ICD-10-CM | POA: Diagnosis not present

## 2020-01-25 DIAGNOSIS — Z885 Allergy status to narcotic agent status: Secondary | ICD-10-CM

## 2020-01-25 DIAGNOSIS — M48061 Spinal stenosis, lumbar region without neurogenic claudication: Secondary | ICD-10-CM

## 2020-01-25 DIAGNOSIS — Z825 Family history of asthma and other chronic lower respiratory diseases: Secondary | ICD-10-CM

## 2020-01-25 DIAGNOSIS — Z833 Family history of diabetes mellitus: Secondary | ICD-10-CM

## 2020-01-25 DIAGNOSIS — Z79899 Other long term (current) drug therapy: Secondary | ICD-10-CM

## 2020-01-25 DIAGNOSIS — Z803 Family history of malignant neoplasm of breast: Secondary | ICD-10-CM

## 2020-01-25 DIAGNOSIS — F0630 Mood disorder due to known physiological condition, unspecified: Secondary | ICD-10-CM

## 2020-01-25 DIAGNOSIS — I10 Essential (primary) hypertension: Secondary | ICD-10-CM | POA: Diagnosis present

## 2020-01-25 DIAGNOSIS — Z8616 Personal history of COVID-19: Secondary | ICD-10-CM | POA: Diagnosis not present

## 2020-01-25 DIAGNOSIS — T796XXA Traumatic ischemia of muscle, initial encounter: Secondary | ICD-10-CM

## 2020-01-25 DIAGNOSIS — R531 Weakness: Secondary | ICD-10-CM

## 2020-01-25 DIAGNOSIS — E876 Hypokalemia: Secondary | ICD-10-CM | POA: Diagnosis present

## 2020-01-25 DIAGNOSIS — Z886 Allergy status to analgesic agent status: Secondary | ICD-10-CM

## 2020-01-25 DIAGNOSIS — R300 Dysuria: Secondary | ICD-10-CM | POA: Diagnosis not present

## 2020-01-25 DIAGNOSIS — M62830 Muscle spasm of back: Secondary | ICD-10-CM | POA: Diagnosis not present

## 2020-01-25 DIAGNOSIS — Z981 Arthrodesis status: Secondary | ICD-10-CM

## 2020-01-25 DIAGNOSIS — Z8249 Family history of ischemic heart disease and other diseases of the circulatory system: Secondary | ICD-10-CM

## 2020-01-25 DIAGNOSIS — Z823 Family history of stroke: Secondary | ICD-10-CM

## 2020-01-25 LAB — CBC WITH DIFFERENTIAL/PLATELET
Abs Immature Granulocytes: 0.04 10*3/uL (ref 0.00–0.07)
Basophils Absolute: 0 10*3/uL (ref 0.0–0.1)
Basophils Relative: 0 %
Eosinophils Absolute: 0 10*3/uL (ref 0.0–0.5)
Eosinophils Relative: 0 %
HCT: 39.3 % (ref 36.0–46.0)
Hemoglobin: 11.9 g/dL — ABNORMAL LOW (ref 12.0–15.0)
Immature Granulocytes: 1 %
Lymphocytes Relative: 6 %
Lymphs Abs: 0.4 10*3/uL — ABNORMAL LOW (ref 0.7–4.0)
MCH: 25.5 pg — ABNORMAL LOW (ref 26.0–34.0)
MCHC: 30.3 g/dL (ref 30.0–36.0)
MCV: 84.2 fL (ref 80.0–100.0)
Monocytes Absolute: 0.4 10*3/uL (ref 0.1–1.0)
Monocytes Relative: 6 %
Neutro Abs: 5.6 10*3/uL (ref 1.7–7.7)
Neutrophils Relative %: 87 %
Platelets: 149 10*3/uL — ABNORMAL LOW (ref 150–400)
RBC: 4.67 MIL/uL (ref 3.87–5.11)
RDW: 16.3 % — ABNORMAL HIGH (ref 11.5–15.5)
WBC: 6.4 10*3/uL (ref 4.0–10.5)
nRBC: 0 % (ref 0.0–0.2)

## 2020-01-25 LAB — CK: Total CK: 3004 U/L — ABNORMAL HIGH (ref 38–234)

## 2020-01-25 LAB — BASIC METABOLIC PANEL
Anion gap: 15 (ref 5–15)
BUN: 23 mg/dL (ref 8–23)
CO2: 24 mmol/L (ref 22–32)
Calcium: 8.1 mg/dL — ABNORMAL LOW (ref 8.9–10.3)
Chloride: 95 mmol/L — ABNORMAL LOW (ref 98–111)
Creatinine, Ser: 0.98 mg/dL (ref 0.44–1.00)
GFR, Estimated: >60 mL/min (ref >60)
Glucose, Bld: 175 mg/dL — ABNORMAL HIGH (ref 70–99)
Potassium: 3.1 mmol/L — ABNORMAL LOW (ref 3.5–5.1)
Sodium: 134 mmol/L — ABNORMAL LOW (ref 135–145)

## 2020-01-25 LAB — LACTIC ACID, PLASMA: Lactic Acid, Venous: 1.6 mmol/L (ref 0.5–1.9)

## 2020-01-25 LAB — RESP PANEL BY RT-PCR (FLU A&B, COVID) ARPGX2
Influenza A by PCR: NEGATIVE
Influenza B by PCR: NEGATIVE
SARS Coronavirus 2 by RT PCR: NEGATIVE

## 2020-01-25 LAB — TSH: TSH: 2.906 u[IU]/mL (ref 0.350–4.500)

## 2020-01-25 LAB — HIV ANTIBODY (ROUTINE TESTING W REFLEX): HIV Screen 4th Generation wRfx: NONREACTIVE

## 2020-01-25 MED ORDER — GADOBUTROL 1 MMOL/ML IV SOLN: 10.0000 mL | Freq: Once | INTRAVENOUS | Status: DC | PRN

## 2020-01-25 MED ORDER — ALPRAZOLAM 0.25 MG PO TABS
0.2500 mg | ORAL_TABLET | Freq: Every evening | ORAL | Status: DC | PRN
  Administered 2020-01-26 – 2020-01-30 (×5): 0.25 mg via ORAL
  Filled 2020-01-25 (×6): qty 1

## 2020-01-25 MED ORDER — GADOBUTROL 1 MMOL/ML IV SOLN
10.0000 mL | Freq: Once | INTRAVENOUS | Status: AC | PRN
  Administered 2020-01-25: 05:00:00 10 mL via INTRAVENOUS

## 2020-01-25 MED ORDER — CALCIUM GLUCONATE-NACL 1-0.675 GM/50ML-% IV SOLN
1.0000 g | Freq: Once | INTRAVENOUS | Status: AC
  Administered 2020-01-25: 12:00:00 1000 mg via INTRAVENOUS
  Filled 2020-01-25: qty 50

## 2020-01-25 MED ORDER — BUSPIRONE HCL 5 MG PO TABS
20.0000 mg | ORAL_TABLET | Freq: Two times a day (BID) | ORAL | Status: DC
  Administered 2020-01-25 – 2020-02-07 (×26): 20 mg via ORAL
  Filled 2020-01-25 (×26): qty 4
  Filled 2020-01-25: qty 2

## 2020-01-25 MED ORDER — SODIUM CHLORIDE 0.9% FLUSH
3.0000 mL | Freq: Two times a day (BID) | INTRAVENOUS | Status: DC
  Administered 2020-01-25 – 2020-02-07 (×23): 3 mL via INTRAVENOUS

## 2020-01-25 MED ORDER — DICLOFENAC SODIUM 1 % TD GEL: 4.0000 g | Freq: Four times a day (QID) | TRANSDERMAL | Status: DC | PRN

## 2020-01-25 MED ORDER — GABAPENTIN 300 MG PO CAPS
300.0000 mg | ORAL_CAPSULE | Freq: Every day | ORAL | Status: DC
  Administered 2020-01-25 – 2020-02-06 (×13): 300 mg via ORAL
  Filled 2020-01-25 (×13): qty 1

## 2020-01-25 MED ORDER — SODIUM CHLORIDE 0.9 % IV SOLN: Freq: Once | INTRAVENOUS | Status: AC

## 2020-01-25 MED ORDER — FLUOXETINE HCL 20 MG PO CAPS
40.0000 mg | ORAL_CAPSULE | Freq: Every day | ORAL | Status: DC
  Administered 2020-01-25 – 2020-02-07 (×13): 40 mg via ORAL
  Filled 2020-01-25 (×14): qty 2

## 2020-01-25 MED ORDER — POTASSIUM CHLORIDE CRYS ER 20 MEQ PO TBCR
40.0000 meq | EXTENDED_RELEASE_TABLET | ORAL | Status: AC
  Administered 2020-01-25: 12:00:00 40 meq via ORAL
  Filled 2020-01-25: qty 2

## 2020-01-25 MED ORDER — LAMOTRIGINE 100 MG PO TABS
100.0000 mg | ORAL_TABLET | Freq: Every day | ORAL | Status: DC
  Administered 2020-01-25 – 2020-02-07 (×13): 100 mg via ORAL
  Filled 2020-01-25 (×14): qty 1

## 2020-01-25 MED ORDER — TRAMADOL HCL 50 MG PO TABS
50.0000 mg | ORAL_TABLET | Freq: Four times a day (QID) | ORAL | Status: DC | PRN
  Administered 2020-01-25 – 2020-02-07 (×28): 50 mg via ORAL
  Filled 2020-01-25 (×29): qty 1

## 2020-01-25 MED ORDER — ACETAMINOPHEN 325 MG PO TABS
650.0000 mg | ORAL_TABLET | Freq: Four times a day (QID) | ORAL | Status: DC | PRN
  Administered 2020-01-26: 650 mg via ORAL
  Filled 2020-01-25: qty 2

## 2020-01-25 MED ORDER — SODIUM CHLORIDE 0.9 % IV SOLN: INTRAVENOUS | Status: DC

## 2020-01-25 MED ORDER — HYDROXYZINE HCL 25 MG PO TABS
100.0000 mg | ORAL_TABLET | Freq: Every day | ORAL | Status: DC
  Administered 2020-01-25 – 2020-01-31 (×7): 100 mg via ORAL
  Filled 2020-01-25 (×7): qty 4

## 2020-01-25 MED ORDER — ACETAMINOPHEN 650 MG RE SUPP: 650.0000 mg | Freq: Four times a day (QID) | RECTAL | Status: DC | PRN

## 2020-01-25 MED ORDER — ONDANSETRON HCL 4 MG/2ML IJ SOLN: 4.0000 mg | Freq: Four times a day (QID) | INTRAMUSCULAR | Status: DC | PRN

## 2020-01-25 MED ORDER — HYDROXYZINE HCL 25 MG PO TABS
50.0000 mg | ORAL_TABLET | Freq: Every day | ORAL | Status: DC
  Administered 2020-01-26 – 2020-02-01 (×7): 50 mg via ORAL
  Filled 2020-01-25: qty 2
  Filled 2020-01-25: qty 1
  Filled 2020-01-25 (×6): qty 2

## 2020-01-25 MED ORDER — ALBUTEROL SULFATE (2.5 MG/3ML) 0.083% IN NEBU: 2.5000 mg | INHALATION_SOLUTION | Freq: Four times a day (QID) | RESPIRATORY_TRACT | Status: DC | PRN

## 2020-01-25 MED ORDER — QUETIAPINE FUMARATE 50 MG PO TABS
100.0000 mg | ORAL_TABLET | Freq: Every day | ORAL | Status: DC
  Administered 2020-01-25 – 2020-02-06 (×13): 100 mg via ORAL
  Filled 2020-01-25: qty 2
  Filled 2020-01-25: qty 4
  Filled 2020-01-25 (×9): qty 2
  Filled 2020-01-25: qty 4
  Filled 2020-01-25 (×4): qty 2

## 2020-01-25 MED ORDER — ENOXAPARIN SODIUM 40 MG/0.4ML ~~LOC~~ SOLN
40.0000 mg | SUBCUTANEOUS | Status: DC
  Administered 2020-01-25 – 2020-02-07 (×14): 40 mg via SUBCUTANEOUS
  Filled 2020-01-25 (×14): qty 0.4

## 2020-01-25 MED ORDER — ONDANSETRON HCL 4 MG PO TABS: 4.0000 mg | ORAL_TABLET | Freq: Four times a day (QID) | ORAL | Status: DC | PRN

## 2020-01-25 MED ORDER — SODIUM CHLORIDE 0.9 % IV SOLN: INTRAVENOUS | Status: AC

## 2020-01-25 MED ORDER — SODIUM CHLORIDE 0.9 % IV BOLUS
1000.0000 mL | Freq: Once | INTRAVENOUS | Status: AC
  Administered 2020-01-25: 07:00:00 1000 mL via INTRAVENOUS

## 2020-01-25 MED ORDER — ASPIRIN EC 81 MG PO TBEC
81.0000 mg | DELAYED_RELEASE_TABLET | Freq: Every day | ORAL | Status: DC
  Administered 2020-01-25 – 2020-02-07 (×14): 81 mg via ORAL
  Filled 2020-01-25 (×14): qty 1

## 2020-01-25 MED ORDER — HYDROXYZINE HCL 25 MG PO TABS: 50.0000 mg | ORAL_TABLET | ORAL | Status: DC

## 2020-01-25 NOTE — ED Notes (Signed)
Attempted to give report and nurse said they will call back.

## 2020-01-25 NOTE — Progress Notes (Signed)
NEW ADMISSION NOTE New Admission Note:   Arrival Method:  E.D. stretcher bed Mental Orientation: Alert and oriented  X4 Telemetry:# 6 NSR Assessment: Completed Skin:No skin issue ,assessed with Seth Bake R.N. IV: Left A/C with NS @150CC /HR. Pain:Denies Tubes:None Safety Measures: Safety Fall Prevention Plan has been given, discussed and signed Admission: Completed [facility] Orientation: Patient has been orientated to the room, unit and staff.  Family:None.  Orders have been reviewed and implemented. Will continue to monitor the patient. Call light has been placed within reach and bed alarm has been activated.   [place], Zenon Mayo, RN

## 2020-01-25 NOTE — ED Notes (Signed)
Pt to MRI

## 2020-01-25 NOTE — ED Triage Notes (Signed)
Pt BIB [facility]. EMS reports that pt fell out of bed trying to go to the bathroom. Pt denies LOC or hitting head. Pt had a spinal fusion recently and reports pain in lower back. EMS reports that pt has good movement and sensation in both legs but some numbness in both legs - right leg more numb than the left. A0x4. VS in EMS 101/56 Pule 63 RR 20 O2 95%

## 2020-01-25 NOTE — Progress Notes (Signed)
Patient admitted with rhabdomyolysis secondary to prolonged immobility.  Patient reports significant back pain and some ongoing right lower extremity numbness.  Her motor examination is good direct testing.  Her follow-up MRI scan is stable with normal postoperative change without evidence of any compressive pathology or any evidence of complication of the surgery site.  At this point we will continue with efforts at pain control and mobilization.  She will likely need skilled nursing facility placement for prolonged convalescence.

## 2020-01-25 NOTE — Consult Note (Signed)
Reason for Consult: Recent lumbar surgery, RLE weakness Referring Physician: Dr. Myra Gianotti Tamara Mccullough is an 64 y.o. female.  HPI: Tamara Mccullough is a 64 y.o. caucasian fe/female with a history outline below. She underwent an L5-S1 fusion by Dr. Annette Stable on 10/05/2019. She presented to the emergency department early this morning. She reports that two days ago, she tried to stand up and fell. She laid on the floor for 15 hours before she was able to get EMS to come to the house and assist her to bed. Today, she tried to get up again and fell. She reports that she is numb from her right knee down to her foot and feels like her right lower extremity is weak. She denies any upper extremity symptoms. She denies speech difficulty, visual changes, or facial droop. She denies bowel or bladder dysfunction. A lumbar MRI was performed this morning.  Past Medical History:  Diagnosis Date   Allergy    Anemia    Arthritis    back    Asthma    h/o   Bipolar disorder (HCC)    Chronic back pain    Depression    Depression    Dyspnea    on exertion   Edema    Hyperlipidemia    Hypertension    Insomnia    Morbid obesity ([place])    Neuromuscular disorder ([place])    Overdose of sleeping tabs 03/10/2013   Panic attacks    Pneumonia    h/o   PONV (postoperative nausea and vomiting)    Restless leg syndrome    Schizophrenia ([place])    Seizures ([place])    hx of seizure 05/2013; last seizure April 2019   Sleep apnea    has not used CPAP in 3 years- not using CPAP   Thyroid disease     Past Surgical History:  Procedure Laterality Date   ANTERIOR CERVICAL DECOMP/DISCECTOMY FUSION N/A 11/09/2013   Procedure:  Anterior cervical decompression/diskectomy/fusion cervical four-five ,cervical five-six,cervical six-seven;  Surgeon: Floyce Stakes, MD;  Location: [facility];  Service: Neurosurgery;  Laterality: N/A;   BACK SURGERY     COLONOSCOPY  2011   CTR     gastric by pass  6/11    MINOR HEMORRHOIDECTOMY     SPINE SURGERY     THYROID LOBECTOMY Right 08/12/2013   Procedure: RIGHT THYROID LOBECTOMY;  Surgeon: Odis Hollingshead, MD;  Location: [facility];  Service: General;  Laterality: Right;   TUBAL LIGATION      Family History  Problem Relation Age of Onset   Congestive Heart Failure Mother    Diabetes Mother        died at 3   Cancer Mother        cervical   Arthritis Mother    Asthma Mother    Depression Mother    Heart disease Mother    Hypertension Mother    Congestive Heart Failure Father    Emphysema Father    Alcohol abuse Father    Diabetes Father    Arthritis Father    COPD Father    Depression Father    Heart disease Father        heart attack died at 4   Hypertension Father    Stroke Father    Suicidality Sister    Colon polyps Sister    Breast cancer Sister    Alcohol abuse Brother    Alcohol abuse Paternal Grandfather    Alcohol abuse  Brother    Alcohol abuse Brother    Colon cancer Neg Hx    Esophageal cancer Neg Hx    Stomach cancer Neg Hx    Rectal cancer Neg Hx     Social History:  reports that she quit smoking about 23 years ago. She has never used smokeless tobacco. She reports previous alcohol use. She reports previous drug use. Drug: Benzodiazepines.  Allergies:  Allergies  Allergen Reactions   Codeine Itching, Nausea And Vomiting, Rash and Other (See Comments)   Melatonin Other (See Comments)    seizure   Ambien [Zolpidem Tartrate] Other (See Comments)    forgetful   Eszopiclone Rash   Lansoprazole Palpitations   Mirapex [Pramipexole] Other (See Comments)    Mouth dry   Motrin [Ibuprofen] Nausea And Vomiting   Prilosec [Omeprazole] Palpitations and Other (See Comments)    Makes heart beat fast.   Trazodone And Nefazodone Other (See Comments)    fainted    Medications: I have reviewed the patient's current medications.  Results for orders placed or performed during the  hospital encounter of 01/25/20 (from the past 48 hour(s))  CBC with Differential/Platelet     Status: Abnormal   Collection Time: 01/25/20  3:35 AM  Result Value Ref Range   WBC 6.4 4.0 - 10.5 K/uL   RBC 4.67 3.87 - 5.11 MIL/uL   Hemoglobin 11.9 (L) 12.0 - 15.0 g/dL   HCT 39.3 36.0 - 46.0 %   MCV 84.2 80.0 - 100.0 fL   MCH 25.5 (L) 26.0 - 34.0 pg   MCHC 30.3 30.0 - 36.0 g/dL   RDW 16.3 (H) 11.5 - 15.5 %   Platelets 149 (L) 150 - 400 K/uL   nRBC 0.0 0.0 - 0.2 %   Neutrophils Relative % 87 %   Neutro Abs 5.6 1.7 - 7.7 K/uL   Lymphocytes Relative 6 %   Lymphs Abs 0.4 (L) 0.7 - 4.0 K/uL   Monocytes Relative 6 %   Monocytes Absolute 0.4 0.1 - 1.0 K/uL   Eosinophils Relative 0 %   Eosinophils Absolute 0.0 0.0 - 0.5 K/uL   Basophils Relative 0 %   Basophils Absolute 0.0 0.0 - 0.1 K/uL   WBC Morphology VACUOLATED NEUTROPHILS    Immature Granulocytes 1 %   Abs Immature Granulocytes 0.04 0.00 - 0.07 K/uL    Comment: Performed at [facility], [address] [place], [place] [address]  Basic metabolic panel     Status: Abnormal   Collection Time: 01/25/20  3:35 AM  Result Value Ref Range   Sodium 134 (L) 135 - 145 mmol/L   Potassium 3.1 (L) 3.5 - 5.1 mmol/L   Chloride 95 (L) 98 - 111 mmol/L   CO2 24 22 - 32 mmol/L   Glucose, Bld 175 (H) 70 - 99 mg/dL    Comment: Glucose reference range applies only to samples taken after fasting for at least 8 hours.   BUN 23 8 - 23 mg/dL   Creatinine, Ser 0.98 0.44 - 1.00 mg/dL   Calcium 8.1 (L) 8.9 - 10.3 mg/dL   GFR, Estimated >60 >60 mL/min    Comment: (NOTE) Calculated using the CKD-EPI Creatinine Equation (2021)    Anion gap 15 5 - 15    Comment: Performed at [facility] [address] [place], [place] [address]  Lactic acid, plasma     Status: None   Collection Time: 01/25/20  3:35 AM  Result Value Ref Range  Lactic Acid, Venous 1.6 0.5 - 1.9 mmol/L    Comment: Performed at [facility], [place] [address],  [place], [place] [address]  CK     Status: Abnormal   Collection Time: 01/25/20  3:35 AM  Result Value Ref Range   Total CK 3,004 (H) 38 - 234 U/L    Comment: Performed at [facility], [place] [address] [place], [place] [address]  Resp Panel by RT-PCR (Flu A&B, Covid) Nasopharyngeal Swab     Status: None   Collection Time: 01/25/20  6:12 AM   Specimen: Nasopharyngeal Swab; Nasopharyngeal(NP) swabs in vial transport medium  Result Value Ref Range   SARS Coronavirus 2 by RT PCR NEGATIVE NEGATIVE    Comment: (NOTE) SARS-CoV-2 target nucleic acids are NOT DETECTED.  The SARS-CoV-2 RNA is generally detectable in upper respiratory specimens during the acute phase of infection. The lowest concentration of SARS-CoV-2 viral copies this assay can detect is 138 copies/mL. A negative result does not preclude SARS-Cov-2 infection and should not be used as the sole basis for treatment or other patient management decisions. A negative result may occur with  improper specimen collection/handling, submission of specimen other than nasopharyngeal swab, presence of viral mutation(s) within the areas targeted by this assay, and inadequate number of viral copies(<138 copies/mL). A negative result must be combined with clinical observations, patient history, and epidemiological information. The expected result is Negative.  Fact Sheet for Patients:  EntrepreneurPulse.com.au  Fact Sheet for Healthcare Providers:  IncredibleEmployment.be  This test is no t yet approved or cleared by the [place] FDA and  has been authorized for detection and/or diagnosis of SARS-CoV-2 by FDA under an Emergency Use Authorization (EUA). This EUA will remain  in effect (meaning this test can be used) for the duration of the COVID-19 declaration under Section 564(b)(1) of the Act, 21 U.S.C.section 360bbb-3(b)(1), unless the authorization is terminated  or revoked sooner.        Influenza A by PCR NEGATIVE NEGATIVE   Influenza B by PCR NEGATIVE NEGATIVE    Comment: (NOTE) The Xpert Xpress SARS-CoV-2/FLU/RSV plus assay is intended as an aid in the diagnosis of influenza from Nasopharyngeal swab specimens and should not be used as a sole basis for treatment. Nasal washings and aspirates are unacceptable for Xpert Xpress SARS-CoV-2/FLU/RSV testing.  Fact Sheet for Patients: EntrepreneurPulse.com.au  Fact Sheet for Healthcare Providers: IncredibleEmployment.be  This test is not yet approved or cleared by the [place] FDA and has been authorized for detection and/or diagnosis of SARS-CoV-2 by FDA under an Emergency Use Authorization (EUA). This EUA will remain in effect (meaning this test can be used) for the duration of the COVID-19 declaration under Section 564(b)(1) of the Act, 21 U.S.C. section 360bbb-3(b)(1), unless the authorization is terminated or revoked.  Performed at [facility], [place] [address], [place], [place] [address]     MR Lumbar Spine W Wo Contrast  Result Date: 01/25/2020 CLINICAL DATA:  New lumbar radiculopathy. History of prior surgery. History of L5-S1 fusion 10/05/2019. Fall from bed going to the bathroom with lower back pain EXAM: MRI LUMBAR SPINE WITHOUT AND WITH CONTRAST TECHNIQUE: Multiplanar and multiecho pulse sequences of the lumbar spine were obtained without and with intravenous contrast. CONTRAST:  5mL GADAVIST GADOBUTROL 1 MMOL/ML IV SOLN COMPARISON:  12/29/2019 FINDINGS: Segmentation:  5 lumbar type vertebrae Alignment:  Physiologic Vertebrae: Recent L5-S1 fusion. No gross change in hardware positioning. The noncompressive fluid collection at the laminectomy defect  and posterior to the left facetectomy has regressed. There is still perineural fat effacement at the left more than right L5-S1 foramina, likely perineural granulation tissue. Due to cage subsidence and residual ridging  there is still bony foraminal narrowing on both sides. No convincing change in vertebral body marrow signal about the cage which is at least partially related to subsidence seen on 12/16/2019 radiograph. Would expect regression of marrow edema related to standard postoperative changes. There is mild presacral edema which is mildly increased. Remote L4 superior endplate fracture. No overt infection or acute fracture Conus medullaris and cauda equina: Conus extends to the L1-2 level. Conus and cauda equina appear normal. Paraspinal and other soft tissues: Decreased fluid collection as noted above at the level of surgery. There is stable posterior granulation tissue. Gallstone versus volume averaging of the gallbladder wall. Disc levels: Stable residual bony foraminal narrowings at L5-S1, as noted above. L4-5 asymmetric rightward disc collapse and endplate and facet ridging with right L4 foraminal impingement. L3-4 disc bulging and mild degenerative facet spurring without impingement. IMPRESSION: 1. No acute finding. 2. Recent L5-S1 fusion with regressing noncompressive fluid collection. There is still marrow edema about the intervertebral cage which is likely related to the subsidence noted on a 12/16/2019 radiograph. 3. Unchanged presumed exuberant granulation tissue around the left foraminal L5 nerve root. 4. L4-5 right foraminal impingement. Electronically Signed   By: Monte Fantasia M.D.   On: 01/25/2020 05:13    Review of Systems  Constitutional: Positive for activity change. Negative for appetite change, chills, diaphoresis, fatigue, fever and unexpected weight change.  HENT: Negative.   Eyes: Negative.   Respiratory: Negative.   Cardiovascular: Negative.   Gastrointestinal: Negative.   Endocrine: Negative.   Genitourinary: Negative.   Musculoskeletal: Positive for arthralgias, back pain, gait problem and myalgias. Negative for joint swelling, neck pain and neck stiffness.  Allergic/Immunologic:  Negative.   Neurological: Positive for weakness and numbness. Negative for seizures, facial asymmetry, speech difficulty and headaches.  Hematological: Negative.   Psychiatric/Behavioral: Positive for confusion, decreased concentration and hallucinations. Negative for agitation and behavioral problems.   Blood pressure (!) 100/59, pulse (!) 59, temperature 98 F (36.7 C), temperature source Oral, resp. rate 20, SpO2 94 %. Physical Exam Constitutional:      Appearance: She is obese. She is ill-appearing.  HENT:     Head: Normocephalic and atraumatic.     Nose: Nose normal.  Eyes:     Extraocular Movements: Extraocular movements intact.     Conjunctiva/sclera: Conjunctivae normal.     Pupils: Pupils are equal, round, and reactive to light.  Cardiovascular:     Rate and Rhythm: Regular rhythm. Bradycardia present.     Pulses: Normal pulses.  Pulmonary:     Effort: Pulmonary effort is normal. No respiratory distress.  Abdominal:     Palpations: Abdomen is soft.     Tenderness: There is no abdominal tenderness.  Musculoskeletal:        General: Normal range of motion.     Cervical back: Normal range of motion and neck supple.     Comments: Tenderness to palpation at right lower extremity below the knee  Skin:    General: Skin is warm and dry.     Capillary Refill: Capillary refill takes less than 2 seconds.  Neurological:     General: No focal deficit present.     Mental Status: She is oriented to person, place, and time.     Comments: Right dorsiflexion 4/5,right hip  flexor 3/5 otherwise strength appears intact  Psychiatric:        Mood and Affect: Mood normal.        Behavior: Behavior normal.     Comments: Reports hallucinations     Assessment/Plan: Patient with recent lumbar fusion and new right lowe extremity numbness and weakness. Patient developed rhabdomyolysis secondary to prolonged immobilization following a fall. MRI with no changes compared to MRI completed on  12/29/2019. No neurosurgical interventions recommended at this time. Neurosurgery will continue to follow.  Tamara Mccullough 01/25/2020, 8:56 AM

## 2020-01-25 NOTE — H&P (Signed)
History and Physical    Tamara Mccullough:[id number] DOB: [birth year] DOA: 01/25/2020  Referring MD/NP/PA: Harrold Donath, MD PCP: Heywood Bene, PA-C  Patient coming from: Home via EMS  Chief Complaint: Falls  I have personally briefly reviewed patient's old medical records in [place]   HPI: Tamara Mccullough is a 64 y.o. female with medical history significant of hypertension, hyperthyroidism, COVID-19 in 05/2019, chronic pain, and multiple prior surgeries who presents after having multiple falls at home.  She relates to falls to right leg weakness and numbness. Patient had L5-S1 interbody fusion for L5-S1 foraminal stenosis with radiculopathy on August 31 with Dr. Annette Stable.  After the procedure she reports that she went home and was barely able to walk.  Surgery fixed issues with pain running down left side of her leg, but reported having right-sided numbness from the knee down to her toes.  Associated symptoms include pins on needles sensation in her right foot and sharp lower back pain that radiates into her groin that is worse with movement she had follow-up with Dr. Annette Stable who she states advised patient give it some time to heal.  Physical therapy had been coming out to her home and she had shock therapy 5 days ago.  She was okay the day following the treatment, but 3 days ago she was unable to get up.  She tried to get out of bed and slid to the floor where she laid for 15 hours until her neighbor came by and was able to call 911.  They were able to get her up and back in the bed.  She had her phone and potty chair near her bed.  Following day she was trying to get up to go to the bathroom and again fell, but was able to call family who ultimately called 911.  Denies any loss of consciousness or trauma to her head with falls.  She also reported to another fall on Thanksgiving day while she was with family.  Patient notes that she had COVID-19 back in April of this year and since  that time has had decreased appetite and lost over 40 pounds.  She has not drank alcohol in over 8 months and quit smoking several years ago.  ED Course: Upon admission to the emergency department patient was seen to be afebrile, pulse 59-60, blood pressure 100/59-115/61, and all other vital signs stable.  Labs significant for hemoglobin 11.9, platelets 149, sodium 134, potassium 3.1, BUN 23, creatinine 0.98, glucose 175, and CK 3004.  MRI of the lumbar spine showed recent L5-S1 fusion with regressing noncompressive fluid collection, marrow edema about the intervertebral cage, and  L4-5 right foraminal impingement.  16 and influenza screening were negative neurosurgery have been consulted.  Patient had received a total of 2 L of normal saline IV fluids.  Review of Systems  Constitutional: Positive for weight loss. Negative for chills and fever.       Positive for poor appetite  HENT: Negative for congestion and nosebleeds.   Eyes: Negative for photophobia and pain.  Respiratory: Negative for cough and shortness of breath.   Cardiovascular: Negative for chest pain and leg swelling.  Gastrointestinal: Negative for abdominal pain, diarrhea, nausea and vomiting.  Genitourinary: Negative for dysuria and hematuria.  Musculoskeletal: Positive for falls, joint pain and myalgias.  Skin: Negative for rash.  Neurological: Positive for tingling and sensory change. Negative for loss of consciousness.  Psychiatric/Behavioral: Negative for memory loss and substance abuse.  Past Medical History:  Diagnosis Date  . Allergy   . Anemia   . Arthritis    back   . Asthma    h/o  . Bipolar disorder ([place])   . Chronic back pain   . Depression   . Depression   . Dyspnea    on exertion  . Edema   . Hyperlipidemia   . Hypertension   . Insomnia   . Morbid obesity ([place])   . Neuromuscular disorder ([place])   . Overdose of sleeping tabs 03/10/2013  . Panic attacks   . Pneumonia    h/o  . PONV (postoperative  nausea and vomiting)   . Restless leg syndrome   . Schizophrenia ([place])   . Seizures ([place])    hx of seizure 05/2013; last seizure April 2019  . Sleep apnea    has not used CPAP in 3 years- not using CPAP  . Thyroid disease     Past Surgical History:  Procedure Laterality Date  . ANTERIOR CERVICAL DECOMP/DISCECTOMY FUSION N/A 11/09/2013   Procedure:  Anterior cervical decompression/diskectomy/fusion cervical four-five ,cervical five-six,cervical six-seven;  Surgeon: Floyce Stakes, MD;  Location: [facility];  Service: Neurosurgery;  Laterality: N/A;  . BACK SURGERY    . COLONOSCOPY  2011  . [facility]    . gastric by pass  6/11  . MINOR HEMORRHOIDECTOMY    . SPINE SURGERY    . THYROID LOBECTOMY Right 08/12/2013   Procedure: RIGHT THYROID LOBECTOMY;  Surgeon: Odis Hollingshead, MD;  Location: [facility];  Service: General;  Laterality: Right;  . TUBAL LIGATION       reports that she quit smoking about 23 years ago. She has never used smokeless tobacco. She reports previous alcohol use. She reports previous drug use. Drug: Benzodiazepines.  Allergies  Allergen Reactions  . Codeine Itching, Nausea And Vomiting, Rash and Other (See Comments)  . Melatonin Other (See Comments)    seizure  . Ambien [Zolpidem Tartrate] Other (See Comments)    forgetful  . Eszopiclone Rash  . Lansoprazole Palpitations  . Mirapex [Pramipexole] Other (See Comments)    Mouth dry  . Motrin [Ibuprofen] Nausea And Vomiting  . Prilosec [Omeprazole] Palpitations and Other (See Comments)    Makes heart beat fast.  . Trazodone And Nefazodone Other (See Comments)    fainted    Family History  Problem Relation Age of Onset  . Congestive Heart Failure Mother   . Diabetes Mother        died at 79  . Cancer Mother        cervical  . Arthritis Mother   . Asthma Mother   . Depression Mother   . Heart disease Mother   . Hypertension Mother   . Congestive Heart Failure Father   . Emphysema Father   . Alcohol abuse  Father   . Diabetes Father   . Arthritis Father   . COPD Father   . Depression Father   . Heart disease Father        heart attack died at 61  . Hypertension Father   . Stroke Father   . Suicidality Sister   . Colon polyps Sister   . Breast cancer Sister   . Alcohol abuse Brother   . Alcohol abuse Paternal Grandfather   . Alcohol abuse Brother   . Alcohol abuse Brother   . Colon cancer Neg Hx   . Esophageal cancer Neg Hx   . Stomach cancer Neg Hx   .  Rectal cancer Neg Hx     Prior to Admission medications   Medication Sig Start Date End Date Taking? Authorizing Provider  acetaminophen (TYLENOL) 500 MG tablet Take 1,000 mg by mouth every 4 (four) hours as needed for moderate pain.     [provider]  ALPRAZolam Duanne Moron) 0.25 MG tablet Take 0.25 mg by mouth at bedtime.  08/11/18   [provider]  aspirin EC 81 MG tablet Take 81 mg by mouth daily.    [provider]  busPIRone (BUSPAR) 10 MG tablet Take 2 tablets (20 mg total) by mouth 2 (two) times daily. Patient not taking: Reported on 01/19/2020 12/02/19   Norman Clay, MD  butalbital-acetaminophen-caffeine (FIORICET) [phone] MG tablet Take 1 tablet by mouth every 4 (four) hours as needed for headache. Patient not taking: Reported on 01/19/2020 10/07/19   Viona Gilmore D, NP  diclofenac sodium (VOLTAREN) 1 % GEL Apply 4 g topically 4 (four) times daily. Patient not taking: Reported on 01/19/2020 03/24/17   Raylene Everts, MD  FLUoxetine (PROZAC) 40 MG capsule Take 1 capsule (40 mg total) by mouth daily. Patient not taking: Reported on 01/19/2020 11/08/19   Norman Clay, MD  fluticasone [facility]) 50 MCG/ACT nasal spray Place 1 spray into both nostrils daily as needed (congestion). Patient not taking: Reported on 01/19/2020    [provider]  gabapentin (NEURONTIN) 300 MG capsule Take 300 mg by mouth daily at 6 PM.  09/17/19   [provider]  hydrochlorothiazide (MICROZIDE) 12.5  MG capsule Take 1 capsule (12.5 mg total) by mouth daily. 08/22/16   Raylene Everts, MD  hydrOXYzine (ATARAX/VISTARIL) 50 MG tablet Take 1 tablet (50 mg total) by mouth 3 (three) times daily as needed. 11/18/19   Norman Clay, MD  lamoTRIgine (LAMICTAL) 100 MG tablet Take 1 tablet (100 mg total) by mouth daily. 11/09/19   Norman Clay, MD  QUEtiapine (SEROQUEL) 100 MG tablet Take 1 tablet (100 mg total) by mouth at bedtime. 11/09/19   Norman Clay, MD  scopolamine (TRANSDERM-SCOP, 1.5 MG,) 1 MG/3DAYS Place 1 patch (1.5 mg total) onto the skin every 3 (three) days. 10/07/19 10/06/20  Viona Gilmore D, NP  tiZANidine (ZANAFLEX) 4 MG tablet Take 1 tablet (4 mg total) by mouth every 6 (six) hours as needed for muscle spasms. 10/07/19 10/06/20  Viona Gilmore D, NP  traMADol (ULTRAM) 50 MG tablet Take 1-2 tablets (50-100 mg total) by mouth every 6 (six) hours as needed for severe pain. 10/07/19   Viona Gilmore D, NP  Vitamin D, Ergocalciferol, (DRISDOL) 1.25 MG (50000 UNIT) CAPS capsule Take 50,000 Units by mouth every 30 (thirty) days. 07/04/19   [provider]    Physical Exam:  Constitutional: Elderly female who appears to be in no acute distress while laying flat on the hospital bed Vitals:   01/25/20 0145 01/25/20 0526  BP: 115/61 (!) 100/59  Pulse: 60 (!) 59  Resp: 20 20  Temp: 98.5 F (36.9 C) 98 F (36.7 C)  TempSrc: Oral Oral  SpO2: 94% 94%   Eyes: PERRL, lids and conjunctivae normal ENMT: Mucous membranes are moist. Posterior pharynx clear of any exudate or lesions. .  Neck: normal, supple, no masses, no thyromegaly Respiratory: clear to auscultation bilaterally, no wheezing, no crackles. Normal respiratory effort. No accessory muscle use.  Cardiovascular: Bradycardic, no murmurs / rubs / gallops. No extremity edema. 2+ pedal pulses. No carotid bruits.  Abdomen: no tenderness, no masses palpated. No hepatosplenomegaly. Bowel sounds positive.  Musculoskeletal: no clubbing /  cyanosis.  Normal muscle tone.  Decreased range of motion to dorsiflex the left foot. Skin: no rashes, lesions, ulcers. No induration Neurologic: CN 2-12 grossly intact.  Decreased sensation noted on the right leg.   Psychiatric: Normal judgment and insight. Alert and oriented x 3. Normal mood.     Labs on Admission: I have personally reviewed following labs and imaging studies  CBC: Recent Labs  Lab 01/25/20 0335  WBC 6.4  NEUTROABS 5.6  HGB 11.9*  HCT 39.3  MCV 84.2  PLT 765*   Basic Metabolic Panel: Recent Labs  Lab 01/25/20 0335  NA 134*  K 3.1*  CL 95*  CO2 24  GLUCOSE 175*  BUN 23  CREATININE 0.98  CALCIUM 8.1*   GFR: CrCl cannot be calculated (Unknown ideal weight.). Liver Function Tests: No results for input(s): AST, ALT, ALKPHOS, BILITOT, PROT, ALBUMIN in the last 168 hours. No results for input(s): LIPASE, AMYLASE in the last 168 hours. No results for input(s): AMMONIA in the last 168 hours. Coagulation Profile: No results for input(s): INR, PROTIME in the last 168 hours. Cardiac Enzymes: Recent Labs  Lab 01/25/20 0335  CKTOTAL 3,004*   BNP (last 3 results) No results for input(s): PROBNP in the last 8760 hours. HbA1C: No results for input(s): HGBA1C in the last 72 hours. CBG: No results for input(s): GLUCAP in the last 168 hours. Lipid Profile: No results for input(s): CHOL, HDL, LDLCALC, TRIG, CHOLHDL, LDLDIRECT in the last 72 hours. Thyroid Function Tests: No results for input(s): TSH, T4TOTAL, FREET4, T3FREE, THYROIDAB in the last 72 hours. Anemia Panel: No results for input(s): VITAMINB12, FOLATE, FERRITIN, TIBC, IRON, RETICCTPCT in the last 72 hours. Urine analysis:    Component Value Date/Time   COLORURINE YELLOW 12/13/2016 1102   APPEARANCEUR CLEAR 12/13/2016 1102   LABSPEC 1.018 12/13/2016 1102   PHURINE 5.0 12/13/2016 1102   GLUCOSEU NEGATIVE 12/13/2016 1102   HGBUR NEGATIVE 12/13/2016 1102   BILIRUBINUR NEGATIVE 12/13/2016 1102    BILIRUBINUR neg 03/07/2014 1459   KETONESUR NEGATIVE 12/13/2016 1102   PROTEINUR 30 (A) 12/13/2016 1102   UROBILINOGEN negative 03/07/2014 1459   UROBILINOGEN 0.2 03/04/2013 1710   NITRITE NEGATIVE 12/13/2016 1102   LEUKOCYTESUR TRACE (A) 12/13/2016 1102   Sepsis Labs: Recent Results (from the past 240 hour(s))  Resp Panel by RT-PCR (Flu A&B, Covid) Nasopharyngeal Swab     Status: None   Collection Time: 01/25/20  6:12 AM   Specimen: Nasopharyngeal Swab; Nasopharyngeal(NP) swabs in vial transport medium  Result Value Ref Range Status   SARS Coronavirus 2 by RT PCR NEGATIVE NEGATIVE Final    Comment: (NOTE) SARS-CoV-2 target nucleic acids are NOT DETECTED.  The SARS-CoV-2 RNA is generally detectable in upper respiratory specimens during the acute phase of infection. The lowest concentration of SARS-CoV-2 viral copies this assay can detect is 138 copies/mL. A negative result does not preclude SARS-Cov-2 infection and should not be used as the sole basis for treatment or other patient management decisions. A negative result may occur with  improper specimen collection/handling, submission of specimen other than nasopharyngeal swab, presence of viral mutation(s) within the areas targeted by this assay, and inadequate number of viral copies(<138 copies/mL). A negative result must be combined with clinical observations, patient history, and epidemiological information. The expected result is Negative.  Fact Sheet for Patients:  EntrepreneurPulse.com.au  Fact Sheet for Healthcare Providers:  IncredibleEmployment.be  This test is no t yet approved or cleared by the [place]  States FDA and  has been authorized for detection and/or diagnosis of SARS-CoV-2 by FDA under an Emergency Use Authorization (EUA). This EUA will remain  in effect (meaning this test can be used) for the duration of the COVID-19 declaration under Section 564(b)(1) of the Act,  21 U.S.C.section 360bbb-3(b)(1), unless the authorization is terminated  or revoked sooner.       Influenza A by PCR NEGATIVE NEGATIVE Final   Influenza B by PCR NEGATIVE NEGATIVE Final    Comment: (NOTE) The Xpert Xpress SARS-CoV-2/FLU/RSV plus assay is intended as an aid in the diagnosis of influenza from Nasopharyngeal swab specimens and should not be used as a sole basis for treatment. Nasal washings and aspirates are unacceptable for Xpert Xpress SARS-CoV-2/FLU/RSV testing.  Fact Sheet for Patients: EntrepreneurPulse.com.au  Fact Sheet for Healthcare Providers: IncredibleEmployment.be  This test is not yet approved or cleared by the [place] FDA and has been authorized for detection and/or diagnosis of SARS-CoV-2 by FDA under an Emergency Use Authorization (EUA). This EUA will remain in effect (meaning this test can be used) for the duration of the COVID-19 declaration under Section 564(b)(1) of the Act, 21 U.S.C. section 360bbb-3(b)(1), unless the authorization is terminated or revoked.  Performed at [facility], [place] [address] [place], [place] [address]      Radiological Exams on Admission: MR Lumbar Spine W Wo Contrast  Result Date: 01/25/2020 CLINICAL DATA:  New lumbar radiculopathy. History of prior surgery. History of L5-S1 fusion 10/05/2019. Fall from bed going to the bathroom with lower back pain EXAM: MRI LUMBAR SPINE WITHOUT AND WITH CONTRAST TECHNIQUE: Multiplanar and multiecho pulse sequences of the lumbar spine were obtained without and with intravenous contrast. CONTRAST:  58mL GADAVIST GADOBUTROL 1 MMOL/ML IV SOLN COMPARISON:  12/29/2019 FINDINGS: Segmentation:  5 lumbar type vertebrae Alignment:  Physiologic Vertebrae: Recent L5-S1 fusion. No gross change in hardware positioning. The noncompressive fluid collection at the laminectomy defect and posterior to the left facetectomy has regressed. There is still  perineural fat effacement at the left more than right L5-S1 foramina, likely perineural granulation tissue. Due to cage subsidence and residual ridging there is still bony foraminal narrowing on both sides. No convincing change in vertebral body marrow signal about the cage which is at least partially related to subsidence seen on 12/16/2019 radiograph. Would expect regression of marrow edema related to standard postoperative changes. There is mild presacral edema which is mildly increased. Remote L4 superior endplate fracture. No overt infection or acute fracture Conus medullaris and cauda equina: Conus extends to the L1-2 level. Conus and cauda equina appear normal. Paraspinal and other soft tissues: Decreased fluid collection as noted above at the level of surgery. There is stable posterior granulation tissue. Gallstone versus volume averaging of the gallbladder wall. Disc levels: Stable residual bony foraminal narrowings at L5-S1, as noted above. L4-5 asymmetric rightward disc collapse and endplate and facet ridging with right L4 foraminal impingement. L3-4 disc bulging and mild degenerative facet spurring without impingement. IMPRESSION: 1. No acute finding. 2. Recent L5-S1 fusion with regressing noncompressive fluid collection. There is still marrow edema about the intervertebral cage which is likely related to the subsidence noted on a 12/16/2019 radiograph. 3. Unchanged presumed exuberant granulation tissue around the left foraminal L5 nerve root. 4. L4-5 right foraminal impingement. Electronically Signed   By: Monte Fantasia M.D.   On: 01/25/2020 05:13    EKG: Independently reviewed.  Sinus bradycardia at 45 bpm with QT prolonged at 405  Assessment/Plan Rhabdomyolysis: Acute.  Patient reported having two recent falls and laying on the floor for at least 15 hours after one of the falls.  CK elevated up to 3004.  Patient had been started on IV fluids. -Admit to telemetry bed -Continue IV fluids 150  mL/h overnight -Recheck CK in a.m. -Hold nephrotoxic agents  Falls secondary to right leg weakness L4-5 right foraminal impingement: Patient reports since interbody fusion of L5-S1 in August she has had right leg numbness and weakness.  MRI did not note any acute findings, but did note L4-L5 foraminal impingement which could be exacerbating symptoms. -PT to evaluate and treat -Transitions of care consult for possible need of rehab -Appreciate neurosurgery consultative services we will follow-up for any further recommendations  Bradycardia: Acute.  Patient's heart rate is noted to be in 40-50s, but otherwise hemodynamically stable and otherwise asymptomatic. -Follow-up telemetry overnight  Hypokalemia: Acute. Potassium 3.1 on admission. -Give 40 mEq of potassium chloride x1 dose now -Continue to monitor in place as needed  Essential hypertension: Blood pressures currently stable.  Home medications include furosemide 20 mg daily. -Hold furosemide  Poor appetite and weight loss: Patient reports that she lost over 40 pounds after having COVID-19 in April. -Check TSH -Check prealbumin  Mood disorder with anxiety -Continue current home regimen  History of COVID-19: Patient had COVID-19 back in April of this year.  DVT prophylaxis: Lovenox Code Status: Full Family Communication: Sister updated over the phone Disposition Plan: Likely will need inpatient rehab Consults called: Neurosurgery Admission status:, Require more than 2 midnight stay for IV fluid hydration and physical therapy  Norval Morton MD [facility]   If 7PM-7AM, please contact night-coverage   01/25/2020, 8:00 AM

## 2020-01-25 NOTE — ED Notes (Signed)
Lunch Tray Ordered @ 1117. 

## 2020-01-25 NOTE — ED Provider Notes (Signed)
[facility] EMERGENCY DEPARTMENT Provider Note   CSN: [id number] Arrival date & time: 01/25/20  0143     History Chief Complaint  Patient presents with  . Back Pain    Tamara Mccullough is a 64 y.o. female.  Patient presents to the emergency department for evaluation of low back pain and leg weakness.  Patient had L5-S1 interbody fusion on August 31.  She reports that she has been unable to stand or bear weight since 2 days ago.  She reports that yesterday she tried to stand up and fell, had to lay on her floor for 15 hours.  She finally got EMS to come to the house and they put her in bed.  Today she tried to get up again and fell, reports that she cannot feel anything from her right knee down.  When she tries to move around the foot is "floppy" she gets in the way of her other leg.  Prior to surgery she was having radicular pain down the left side, has not previously had any right-sided symptoms.  No speech difficulty, facial droop or right upper extremity weakness or numbness.        Past Medical History:  Diagnosis Date  . Allergy   . Anemia   . Arthritis    back   . Asthma    h/o  . Bipolar disorder ([place])   . Chronic back pain   . Depression   . Depression   . Dyspnea    on exertion  . Edema   . Hyperlipidemia   . Hypertension   . Insomnia   . Morbid obesity ([place])   . Neuromuscular disorder ([place])   . Overdose of sleeping tabs 03/10/2013  . Panic attacks   . Pneumonia    h/o  . PONV (postoperative nausea and vomiting)   . Restless leg syndrome   . Schizophrenia ([place])   . Seizures ([place])    hx of seizure 05/2013; last seizure April 2019  . Sleep apnea    has not used CPAP in 3 years- not using CPAP  . Thyroid disease     Patient Active Problem List   Diagnosis Date Noted  . Rhabdomyolysis 01/25/2020  . PTSD (post-traumatic stress disorder) 10/14/2017  . Seizure ([place]) 12/13/2016  . Lumbar disc disorder 11/07/2016  . Gastric bypass status  for obesity 08/22/2016  . Seizures ([place]) 09/01/2015  . Lumbar foraminal stenosis 07/17/2015  . OA (osteoarthritis) of knee 07/17/2015  . Alcohol use disorder, severe, dependence ([place])   . Insomnia due to anxiety and fear 12/20/2013  . Cervical stenosis of spinal canal 11/09/2013  . RLS (restless legs syndrome) 08/09/2013  . Multinodular goiter 06/09/2013  . Alcoholism in remission ([place]) 03/15/2013  . Essential hypertension, benign 05/20/2012  . GAD (generalized anxiety disorder) 05/20/2012  . Hyperlipidemia with target LDL less than 100 05/20/2012  . Mood disorder in conditions classified elsewhere 05/20/2012  . Anemia, vitamin B12 deficiency 07/06/2009  . Obesity 06/29/2009  . PERSONAL HX COLONIC POLYPS 06/29/2009    Past Surgical History:  Procedure Laterality Date  . ANTERIOR CERVICAL DECOMP/DISCECTOMY FUSION N/A 11/09/2013   Procedure:  Anterior cervical decompression/diskectomy/fusion cervical four-five ,cervical five-six,cervical six-seven;  Surgeon: Floyce Stakes, MD;  Location: [facility];  Service: Neurosurgery;  Laterality: N/A;  . BACK SURGERY    . COLONOSCOPY  2011  . CTR    . gastric by pass  6/11  . MINOR HEMORRHOIDECTOMY    . SPINE  SURGERY    . THYROID LOBECTOMY Right 08/12/2013   Procedure: RIGHT THYROID LOBECTOMY;  Surgeon: Odis Hollingshead, MD;  Location: [facility];  Service: General;  Laterality: Right;  . TUBAL LIGATION       OB History   No obstetric history on file.     Family History  Problem Relation Age of Onset  . Congestive Heart Failure Mother   . Diabetes Mother        died at 40  . Cancer Mother        cervical  . Arthritis Mother   . Asthma Mother   . Depression Mother   . Heart disease Mother   . Hypertension Mother   . Congestive Heart Failure Father   . Emphysema Father   . Alcohol abuse Father   . Diabetes Father   . Arthritis Father   . COPD Father   . Depression Father   . Heart disease Father        heart attack died at  34  . Hypertension Father   . Stroke Father   . Suicidality Sister   . Colon polyps Sister   . Breast cancer Sister   . Alcohol abuse Brother   . Alcohol abuse Paternal Grandfather   . Alcohol abuse Brother   . Alcohol abuse Brother   . Colon cancer Neg Hx   . Esophageal cancer Neg Hx   . Stomach cancer Neg Hx   . Rectal cancer Neg Hx     Social History   Tobacco Use  . Smoking status: Former Smoker    Quit date: 02/05/1996    Years since quitting: 23.9  . Smokeless tobacco: Never Used  Vaping Use  . Vaping Use: Never used  Substance Use Topics  . Alcohol use: Not Currently    Comment: heavy liquor for the last year   . Drug use: Not Currently    Types: Benzodiazepines    Home Medications Prior to Admission medications   Medication Sig Start Date End Date Taking? Authorizing Provider  acetaminophen (TYLENOL) 500 MG tablet Take 1,000 mg by mouth every 4 (four) hours as needed for moderate pain.     [provider]  ALPRAZolam Duanne Moron) 0.25 MG tablet Take 0.25 mg by mouth at bedtime.  08/11/18   [provider]  aspirin EC 81 MG tablet Take 81 mg by mouth daily.    [provider]  busPIRone (BUSPAR) 10 MG tablet Take 2 tablets (20 mg total) by mouth 2 (two) times daily. Patient not taking: Reported on 01/19/2020 12/02/19   Norman Clay, MD  butalbital-acetaminophen-caffeine (FIORICET) [phone] MG tablet Take 1 tablet by mouth every 4 (four) hours as needed for headache. Patient not taking: Reported on 01/19/2020 10/07/19   Viona Gilmore D, NP  diclofenac sodium (VOLTAREN) 1 % GEL Apply 4 g topically 4 (four) times daily. Patient not taking: Reported on 01/19/2020 03/24/17   Raylene Everts, MD  FLUoxetine (PROZAC) 40 MG capsule Take 1 capsule (40 mg total) by mouth daily. Patient not taking: Reported on 01/19/2020 11/08/19   Norman Clay, MD  fluticasone [facility]) 50 MCG/ACT nasal spray Place 1 spray into both nostrils daily as needed  (congestion). Patient not taking: Reported on 01/19/2020    [provider]  gabapentin (NEURONTIN) 300 MG capsule Take 300 mg by mouth daily at 6 PM.  09/17/19   [provider]  hydrochlorothiazide (MICROZIDE) 12.5 MG capsule Take 1 capsule (12.5 mg total) by mouth  daily. 08/22/16   Eustace Moore, MD  hydrOXYzine (ATARAX/VISTARIL) 50 MG tablet Take 1 tablet (50 mg total) by mouth 3 (three) times daily as needed. 11/18/19   Neysa Hotter, MD  lamoTRIgine (LAMICTAL) 100 MG tablet Take 1 tablet (100 mg total) by mouth daily. 11/09/19   Neysa Hotter, MD  QUEtiapine (SEROQUEL) 100 MG tablet Take 1 tablet (100 mg total) by mouth at bedtime. 11/09/19   Neysa Hotter, MD  scopolamine (TRANSDERM-SCOP, 1.5 MG,) 1 MG/3DAYS Place 1 patch (1.5 mg total) onto the skin every 3 (three) days. 10/07/19 10/06/20  Val Eagle D, NP  tiZANidine (ZANAFLEX) 4 MG tablet Take 1 tablet (4 mg total) by mouth every 6 (six) hours as needed for muscle spasms. 10/07/19 10/06/20  Val Eagle D, NP  traMADol (ULTRAM) 50 MG tablet Take 1-2 tablets (50-100 mg total) by mouth every 6 (six) hours as needed for severe pain. 10/07/19   Val Eagle D, NP  Vitamin D, Ergocalciferol, (DRISDOL) 1.25 MG (50000 UNIT) CAPS capsule Take 50,000 Units by mouth every 30 (thirty) days. 07/04/19   [provider]    Allergies    Codeine, Melatonin, Ambien [zolpidem tartrate], Eszopiclone, Lansoprazole, Mirapex [pramipexole], Motrin [ibuprofen], Prilosec [omeprazole], and Trazodone and nefazodone  Review of Systems   Review of Systems  Musculoskeletal: Positive for back pain.  Neurological: Positive for weakness and numbness.  All other systems reviewed and are negative.   Physical Exam Updated Vital Signs BP (!) 100/59 (BP Location: Left Arm)   Pulse (!) 59   Temp 98 F (36.7 C) (Oral)   Resp 20   SpO2 94%   Physical Exam Vitals and nursing note reviewed.  Constitutional:      General: She is not in  acute distress.    Appearance: Normal appearance. She is well-developed and well-nourished.  HENT:     Head: Normocephalic and atraumatic.     Right Ear: Hearing normal.     Left Ear: Hearing normal.     Nose: Nose normal.     Mouth/Throat:     Mouth: Oropharynx is clear and moist and mucous membranes are normal.  Eyes:     Extraocular Movements: EOM normal.     Conjunctiva/sclera: Conjunctivae normal.     Pupils: Pupils are equal, round, and reactive to light.  Cardiovascular:     Rate and Rhythm: Regular rhythm.     Pulses:          Dorsalis pedis pulses are 1+ on the right side and 1+ on the left side.     Heart sounds: S1 normal and S2 normal. No murmur heard. No friction rub. No gallop.   Pulmonary:     Effort: Pulmonary effort is normal. No respiratory distress.     Breath sounds: Normal breath sounds.  Chest:     Chest wall: No tenderness.  Abdominal:     General: Bowel sounds are normal.     Palpations: Abdomen is soft. There is no hepatosplenomegaly.     Tenderness: There is no abdominal tenderness. There is no guarding or rebound. Negative signs include Murphy's sign and McBurney's sign.     Hernia: No hernia is present.  Musculoskeletal:        General: Normal range of motion.     Cervical back: Normal range of motion and neck supple.  Skin:    General: Skin is warm, dry and intact.     Findings: No rash.     Nails: There is no  cyanosis.  Neurological:     Mental Status: She is alert and oriented to person, place, and time.     GCS: GCS eye subscore is 4. GCS verbal subscore is 5. GCS motor subscore is 6.     Cranial Nerves: No cranial nerve deficit.     Sensory: Sensation is intact. No sensory deficit.     Motor: Weakness present.     Deep Tendon Reflexes: Strength normal.     Comments: RLE str 2+/5 LLE 3+/5  Psychiatric:        Mood and Affect: Mood and affect normal.        Speech: Speech normal.        Behavior: Behavior normal.        Thought Content:  Thought content normal.     ED Results / Procedures / Treatments   Labs (all labs ordered are listed, but only abnormal results are displayed) Labs Reviewed  CBC WITH DIFFERENTIAL/PLATELET - Abnormal; Notable for the following components:      Result Value   Hemoglobin 11.9 (*)    MCH 25.5 (*)    RDW 16.3 (*)    Platelets 149 (*)    Lymphs Abs 0.4 (*)    All other components within normal limits  BASIC METABOLIC PANEL - Abnormal; Notable for the following components:   Sodium 134 (*)    Potassium 3.1 (*)    Chloride 95 (*)    Glucose, Bld 175 (*)    Calcium 8.1 (*)    All other components within normal limits  CK - Abnormal; Notable for the following components:   Total CK 3,004 (*)    All other components within normal limits  RESP PANEL BY RT-PCR (FLU A&B, COVID) ARPGX2  LACTIC ACID, PLASMA    EKG None  Radiology MR Lumbar Spine W Wo Contrast  Result Date: 01/25/2020 CLINICAL DATA:  New lumbar radiculopathy. History of prior surgery. History of L5-S1 fusion 10/05/2019. Fall from bed going to the bathroom with lower back pain EXAM: MRI LUMBAR SPINE WITHOUT AND WITH CONTRAST TECHNIQUE: Multiplanar and multiecho pulse sequences of the lumbar spine were obtained without and with intravenous contrast. CONTRAST:  80mL GADAVIST GADOBUTROL 1 MMOL/ML IV SOLN COMPARISON:  12/29/2019 FINDINGS: Segmentation:  5 lumbar type vertebrae Alignment:  Physiologic Vertebrae: Recent L5-S1 fusion. No gross change in hardware positioning. The noncompressive fluid collection at the laminectomy defect and posterior to the left facetectomy has regressed. There is still perineural fat effacement at the left more than right L5-S1 foramina, likely perineural granulation tissue. Due to cage subsidence and residual ridging there is still bony foraminal narrowing on both sides. No convincing change in vertebral body marrow signal about the cage which is at least partially related to subsidence seen on  12/16/2019 radiograph. Would expect regression of marrow edema related to standard postoperative changes. There is mild presacral edema which is mildly increased. Remote L4 superior endplate fracture. No overt infection or acute fracture Conus medullaris and cauda equina: Conus extends to the L1-2 level. Conus and cauda equina appear normal. Paraspinal and other soft tissues: Decreased fluid collection as noted above at the level of surgery. There is stable posterior granulation tissue. Gallstone versus volume averaging of the gallbladder wall. Disc levels: Stable residual bony foraminal narrowings at L5-S1, as noted above. L4-5 asymmetric rightward disc collapse and endplate and facet ridging with right L4 foraminal impingement. L3-4 disc bulging and mild degenerative facet spurring without impingement. IMPRESSION: 1. No acute finding. 2.  Recent L5-S1 fusion with regressing noncompressive fluid collection. There is still marrow edema about the intervertebral cage which is likely related to the subsidence noted on a 12/16/2019 radiograph. 3. Unchanged presumed exuberant granulation tissue around the left foraminal L5 nerve root. 4. L4-5 right foraminal impingement. Electronically Signed   By: Monte Fantasia M.D.   On: 01/25/2020 05:13    Procedures Procedures (including critical care time)  Medications Ordered in ED Medications  gadobutrol (GADAVIST) 1 MMOL/ML injection 10 mL (has no administration in time range)  gadobutrol (GADAVIST) 1 MMOL/ML injection 10 mL (10 mLs Intravenous Contrast Given 01/25/20 0445)    ED Course  I have reviewed the triage vital signs and the nursing notes.  Pertinent labs & imaging results that were available during my care of the patient were reviewed by me and considered in my medical decision making (see chart for details).    MDM Rules/Calculators/A&P                          Patient presents to emergency department with decreased ability to walk for period of 2  weeks.  Patient had L5-S1 lumbar surgery on August 31.  Approximately 2 weeks ago she started having increased back pain.  An MRI at that time did not show any acute problems.  Since then, however, she has become unable to walk.  She reports complete numbness of the right leg from the knee down and cannot lift it.  Prior to that she noticed that the foot was floppy and she could not control it when she was trying to walk.  Patient had a fall 2 days ago and could not get up off the ground.  She laid on the ground for 15 hours.  She has rhabdomyolysis as a result of this.  Patient fell again today and presented to the ER for evaluation.  Examination reveals decreased sensation of the right lower extremity with plus out of 5 strength against gravity.  MRI, however, does not show any explanation for this.  Patient will be admitted by medicine for treatment of rhabdomyolysis, PT evaluation.  Neurosurgery has been contacted and they will consult on the patient.  Final Clinical Impression(s) / ED Diagnoses Final diagnoses:  Traumatic rhabdomyolysis, initial encounter [facility])    Rx / DC Orders ED Discharge Orders    None       Grisell Bissette, Gwenyth Allegra, MD 01/25/20 [phone]

## 2020-01-26 DIAGNOSIS — E876 Hypokalemia: Secondary | ICD-10-CM | POA: Diagnosis not present

## 2020-01-26 DIAGNOSIS — R531 Weakness: Secondary | ICD-10-CM | POA: Diagnosis not present

## 2020-01-26 DIAGNOSIS — I10 Essential (primary) hypertension: Secondary | ICD-10-CM | POA: Diagnosis not present

## 2020-01-26 DIAGNOSIS — R001 Bradycardia, unspecified: Secondary | ICD-10-CM | POA: Diagnosis not present

## 2020-01-26 LAB — COMPREHENSIVE METABOLIC PANEL
ALT: 33 U/L (ref 0–44)
AST: 51 U/L — ABNORMAL HIGH (ref 15–41)
Albumin: 2.2 g/dL — ABNORMAL LOW (ref 3.5–5.0)
Alkaline Phosphatase: 140 U/L — ABNORMAL HIGH (ref 38–126)
Anion gap: 12 (ref 5–15)
BUN: 15 mg/dL (ref 8–23)
CO2: 21 mmol/L — ABNORMAL LOW (ref 22–32)
Calcium: 7.7 mg/dL — ABNORMAL LOW (ref 8.9–10.3)
Chloride: 101 mmol/L (ref 98–111)
Creatinine, Ser: 0.77 mg/dL (ref 0.44–1.00)
GFR, Estimated: >60 mL/min (ref >60)
Glucose, Bld: 119 mg/dL — ABNORMAL HIGH (ref 70–99)
Potassium: 3.5 mmol/L (ref 3.5–5.1)
Sodium: 134 mmol/L — ABNORMAL LOW (ref 135–145)
Total Bilirubin: 1 mg/dL (ref 0.3–1.2)
Total Protein: 5.2 g/dL — ABNORMAL LOW (ref 6.5–8.1)

## 2020-01-26 LAB — PREALBUMIN: Prealbumin: <5 mg/dL — ABNORMAL LOW (ref 18–38)

## 2020-01-26 LAB — CBC
HCT: 36.7 % (ref 36.0–46.0)
Hemoglobin: 11.1 g/dL — ABNORMAL LOW (ref 12.0–15.0)
MCH: 25.8 pg — ABNORMAL LOW (ref 26.0–34.0)
MCHC: 30.2 g/dL (ref 30.0–36.0)
MCV: 85.3 fL (ref 80.0–100.0)
Platelets: UNDETERMINED 10*3/uL (ref 150–400)
RBC: 4.3 MIL/uL (ref 3.87–5.11)
RDW: 16.3 % — ABNORMAL HIGH (ref 11.5–15.5)
WBC: 4.5 10*3/uL (ref 4.0–10.5)
nRBC: 0 % (ref 0.0–0.2)

## 2020-01-26 LAB — CK: Total CK: 964 U/L — ABNORMAL HIGH (ref 38–234)

## 2020-01-26 NOTE — Evaluation (Signed)
Physical Therapy Evaluation Patient Details Name: Tamara Mccullough MRN: [id number] DOB: 1955/06/05 Today's Date: 01/26/2020   History of Present Illness  Pt is a 64yo female s/p fall, lying on floor x15 hours. Found to have rhabdo, R sided weakness. MRI lumbar: L4-5 right foraminal impingementPMHx: L5-S1 sx 09/2019. ACDF 11/2013.  Clinical Impression   Patient received in bed, just premedicated by nursing and agreeable to therapy session. Needed heavy physical assist for bed mobility, however did better than anticipated with OOB activity including functional transfers and gait with RW- still quite weak and tremulous and definitely a high fall risk if left to her own devices at home. Recliner not available in room, left in bed for now with all needs met and RN aware of patient status. Will definitely benefit from intensive therapies in the CIR setting prior to return home.     Follow Up Recommendations CIR    Equipment Recommendations  Rolling walker with 5" wheels;3in1 (PT);Wheelchair (measurements PT);Wheelchair cushion (measurements PT)    Recommendations for Other Services       Precautions / Restrictions Precautions Precautions: Fall;Other (comment) Precaution Comments: multiple falls in past 6 mos Restrictions Weight Bearing Restrictions: No      Mobility  Bed Mobility Overal bed mobility: Needs Assistance Bed Mobility: Supine to Sit;Sit to Supine     Supine to sit: Mod assist;+2 for physical assistance Sit to supine: Mod assist;+2 for physical assistance   General bed mobility comments: modAx2 for management of BLEs and trunk, also cues for rolling to prevent excess stress on her back/increase in pain    Transfers Overall transfer level: Needs assistance Equipment used: Rolling walker (2 wheeled) Transfers: Sit to/from Stand Sit to Stand: Min guard;+2 safety/equipment         General transfer comment: min guardx2 for safety given recent falls/weakness- able to  power up well but generally tremulous and very weak  Ambulation/Gait Ambulation/Gait assistance: Min guard;+2 safety/equipment Gait Distance (Feet): 60 Feet Assistive device: Rolling walker (2 wheeled) Gait Pattern/deviations: Step-through pattern;Decreased step length - right;Decreased step length - left;Decreased stride length;Decreased weight shift to right;Decreased weight shift to left;Trunk flexed;Wide base of support Gait velocity: decreased   General Gait Details: slow and tremulous with RW- needed a lot of extra time to complete this gait distance as well as multiple standing rest breaks. Poor insight into current weakness and deficits, wanted to push further than was safe and needed cues to turn around and walk back to her room as well as chair follow  Stairs            Wheelchair Mobility    Modified Rankin (Stroke Patients Only)       Balance Overall balance assessment: Needs assistance;History of Falls Sitting-balance support: Bilateral upper extremity supported;Feet supported Sitting balance-Leahy Scale: Fair     Standing balance support: Bilateral upper extremity supported;During functional activity Standing balance-Leahy Scale: Poor Standing balance comment: hx of multiple recent falls, heavy reliance on BUE support, tremulous                             Pertinent Vitals/Pain Pain Assessment: Faces Faces Pain Scale: Hurts little more Pain Location: back pain Pain Descriptors / Indicators: Aching;Discomfort Pain Intervention(s): Limited activity within patient's tolerance;Monitored during session    Home Living Family/patient expects to be discharged to:: Private residence Living Arrangements: Alone Available Help at Discharge: Friend(s);Available PRN/intermittently Type of Home: House Home Access: Stairs to enter Entrance  Stairs-Rails: Right Entrance Stairs-Number of Steps: 3 Home Layout: One level Home Equipment: Walker - 2  wheels;Walker - 4 wheels;Cane - single point;Bedside commode;Shower seat;Grab bars - tub/shower;Toilet riser Additional Comments: goes upstairss sideways    Prior Function Level of Independence: Independent with assistive device(s)         Comments: independent with ADL; IADL- driving; mobility with RW or SPC.     Hand Dominance   Dominant Hand: Right    Extremity/Trunk Assessment   Upper Extremity Assessment Upper Extremity Assessment: Defer to OT evaluation    Lower Extremity Assessment Lower Extremity Assessment: Generalized weakness (functionally, did not notice excessive buckling or weakness in RLE with pregait and gait activities)    Cervical / Trunk Assessment Cervical / Trunk Assessment: Kyphotic  Communication   Communication: No difficulties  Cognition Arousal/Alertness: Awake/alert Behavior During Therapy: WFL for tasks assessed/performed;Flat affect Overall Cognitive Status: Impaired/Different from baseline Area of Impairment: Memory;Safety/judgement;Problem solving                     Memory: Decreased short-term memory   Safety/Judgement: Decreased awareness of safety;Decreased awareness of deficits   Problem Solving: Slow processing;Decreased initiation;Difficulty sequencing;Requires verbal cues General Comments: seems a bit confused- told us she did not want to go to intensive rehab center then told us later she wanted to go to CIR downstairs and perseverated on "getting to rehab downstairs"; definite impairment in safety and judgement as well as poor insight into deficits      General Comments      Exercises     Assessment/Plan    PT Assessment Patient needs continued PT services  PT Problem List Decreased strength;Decreased cognition;Obesity;Decreased activity tolerance;Decreased safety awareness;Decreased balance;Pain;Decreased mobility;Decreased coordination       PT Treatment Interventions DME instruction;Balance training;Gait  training;Stair training;Cognitive remediation;Functional mobility training;Patient/family education;Therapeutic activities;Therapeutic exercise    PT Goals (Current goals can be found in the Care Plan section)  Acute Rehab PT Goals Patient Stated Goal: go to the rehab downstairs PT Goal Formulation: With patient Time For Goal Achievement: 02/09/20 Potential to Achieve Goals: Fair    Frequency Min 3X/week   Barriers to discharge        Co-evaluation               AM-PAC PT "6 Clicks" Mobility  Outcome Measure Help needed turning from your back to your side while in a flat bed without using bedrails?: A Little Help needed moving from lying on your back to sitting on the side of a flat bed without using bedrails?: A Lot Help needed moving to and from a bed to a chair (including a wheelchair)?: A Little Help needed standing up from a chair using your arms (e.g., wheelchair or bedside chair)?: A Little Help needed to walk in hospital room?: A Little Help needed climbing 3-5 steps with a railing? : A Lot 6 Click Score: 16    End of Session Equipment Utilized During Treatment: Gait belt Activity Tolerance: Patient limited by fatigue Patient left: in bed;with call bell/phone within reach;with bed alarm set Nurse Communication: Mobility status PT Visit Diagnosis: Unsteadiness on feet (R26.81);Difficulty in walking, not elsewhere classified (R26.2);Repeated falls (R29.6);Muscle weakness (generalized) (M62.81)    Time: 0175-1025 PT Time Calculation (min) (ACUTE ONLY): 33 min   Charges:   PT Evaluation $PT Eval Moderate Complexity: 1 Mod (co-eval with OT)          Ann Lions PT, DPT, PN1   Supplemental Physical Therapist [facility]    Pager [phone] Acute Rehab Office [phone]

## 2020-01-26 NOTE — Evaluation (Signed)
Occupational Therapy Evaluation Patient Details Name: NIKELLE TUSS MRN: [id number] DOB: 1955-10-25 Today's Date: 01/26/2020    History of Present Illness Pt is a 64yo female s/p fall, lying on floor x15 hours. Found to have rhabdo, R sided weakness. MRI lumbar: L4-5 right foraminal impingementPMHx: L5-S1 sx 09/2019. ACDF 11/2013.   Clinical Impression   Pt PTA: Pt living alone, was independent prior and using SPC for mobility. Pt currently limited by decreased strength, decreased activity tolerance, decreased ability to care for self and increased pain. Pt set-upA to modA for ADL; limited with mobility due to pain and weakness - modA +2 for bed mobility; minA overall for mobility and chair follow required. VSS on RA. 95 BPM; 98% O2 on RA. Pt would benefit from continued OT skilled services for ADL, mobility and safety in CIR setting. OT following acutely.      Follow Up Recommendations  CIR    Equipment Recommendations  3 in 1 bedside commode    Recommendations for Other Services       Precautions / Restrictions Precautions Precautions: Fall;Other (comment) Precaution Comments: multiple falls in past 6 mos Restrictions Weight Bearing Restrictions: No      Mobility Bed Mobility Overal bed mobility: Needs Assistance Bed Mobility: Supine to Sit;Sit to Supine     Supine to sit: Mod assist;+2 for physical assistance Sit to supine: Mod assist;+2 for physical assistance   General bed mobility comments: modAx2 for management of BLEs and trunk, also cues for log rolling to prevent pain in low back    Transfers Overall transfer level: Needs assistance Equipment used: Rolling walker (2 wheeled) Transfers: Sit to/from [employer] Sit to Stand: Min guard;+2 safety/equipment Stand pivot transfers: Min assist       General transfer comment: minA to avoid plopping and for hand placement    Balance Overall balance assessment: Needs assistance;History of  Falls Sitting-balance support: Bilateral upper extremity supported;Feet supported Sitting balance-Leahy Scale: Fair     Standing balance support: Bilateral upper extremity supported;During functional activity Standing balance-Leahy Scale: Poor Standing balance comment: hx of multiple recent falls, heavy reliance on BUE support, tremulous                           ADL either performed or assessed with clinical judgement   ADL Overall ADL's : Needs assistance/impaired Eating/Feeding: Set up;Sitting   Grooming: Set up;Sitting   Upper Body Bathing: Minimal assistance;Sitting   Lower Body Bathing: Moderate assistance;Sitting/lateral leans;Sit to/from stand   Upper Body Dressing : Minimal assistance;Sitting   Lower Body Dressing: Moderate assistance;Sit to/from stand   Toilet Transfer: Minimal assistance;Ambulation;Cueing for safety;RW Toilet Transfer Details (indicate cue type and reason): Pt requires assist for hand placement Toileting- Clothing Manipulation and Hygiene: Moderate assistance;Sitting/lateral lean;Sit to/from stand Toileting - Clothing Manipulation Details (indicate cue type and reason): set-upA for sitting frontal pericare; modA for posterior pericare     Functional mobility during ADLs: Min guard;+2 for physical assistance;Rolling walker;Cueing for safety General ADL Comments: Pt limited by decreased strength, decreased activity tolerance, decreased ability to care for self and increased pain.     Vision Baseline Vision/History: Wears glasses Wears Glasses: At all times Patient Visual Report: No change from baseline Vision Assessment?: No apparent visual deficits     Perception     Praxis      Pertinent Vitals/Pain Pain Assessment: Faces Faces Pain Scale: Hurts little more Pain Location: back pain Pain Descriptors / Indicators: Aching;Discomfort  Pain Intervention(s): Monitored during session;Limited activity within patient's tolerance      Hand Dominance Right   Extremity/Trunk Assessment Upper Extremity Assessment Upper Extremity Assessment: Generalized weakness   Lower Extremity Assessment Lower Extremity Assessment: Generalized weakness   Cervical / Trunk Assessment Cervical / Trunk Assessment: Kyphotic   Communication Communication Communication: No difficulties   Cognition Arousal/Alertness: Awake/alert Behavior During Therapy: WFL for tasks assessed/performed;Flat affect Overall Cognitive Status: Impaired/Different from baseline Area of Impairment: Memory;Safety/judgement;Problem solving                     Memory: Decreased short-term memory   Safety/Judgement: Decreased awareness of safety;Decreased awareness of deficits   Problem Solving: Slow processing;Decreased initiation;Difficulty sequencing;Requires verbal cues General Comments: Poor insight into deficits, decreased processing speed, and decreased ability to problem solve at times   General Comments  VSS on RA. 95 BPM; 98% O2 on RA.    Exercises     Shoulder Instructions      Home Living Family/patient expects to be discharged to:: Private residence Living Arrangements: Alone Available Help at Discharge: Friend(s);Available PRN/intermittently Type of Home: House Home Access: Stairs to enter [employer] of Steps: 3 Entrance Stairs-Rails: Right Home Layout: One level     Bathroom Shower/Tub: Tub/shower unit;Curtain   Firefighter: Standard     Home Equipment: Environmental consultant - 2 wheels;Walker - 4 wheels;Cane - single point;Bedside commode;Shower seat;Grab bars - tub/shower;Toilet riser   Additional Comments: goes upstairs sideways      Prior Functioning/Environment Level of Independence: Independent with assistive device(s)        Comments: independent with ADL; IADL- driving; mobility with RW or SPC.        OT Problem List: Decreased strength;Decreased activity tolerance;Impaired balance (sitting and/or  standing);Decreased safety awareness;Pain;Increased edema;Decreased knowledge of use of DME or AE      OT Treatment/Interventions: Self-care/ADL training;Therapeutic exercise;Energy conservation;Therapeutic activities;Patient/family education;Other (comment);DME and/or AE instruction    OT Goals(Current goals can be found in the care plan section) Acute Rehab OT Goals Patient Stated Goal: go to the rehab downstairs Time For Goal Achievement: 02/09/20 Potential to Achieve Goals: Good ADL Goals Pt Will Perform Grooming: with supervision;standing Pt Will Perform Lower Body Dressing: with min guard assist;sitting/lateral leans;sit to/from stand Pt Will Transfer to Toilet: with supervision;ambulating;bedside commode Pt/caregiver will Perform Home Exercise Program: Increased strength;Both right and left upper extremity;With Supervision Additional ADL Goal #1: Pt will state 3 fall prevention strategies to increase independence. Additional ADL Goal #2: Pt with increase to supervisionA for bed mobility to increase independence for ADL.  OT Frequency: Min 2X/week   Barriers to D/C:            Co-evaluation              AM-PAC OT "6 Clicks" Daily Activity     Outcome Measure Help from another person eating meals?: None Help from another person taking care of personal grooming?: A Little Help from another person toileting, which includes using toliet, bedpan, or urinal?: A Lot Help from another person bathing (including washing, rinsing, drying)?: A Lot Help from another person to put on and taking off regular upper body clothing?: A Little Help from another person to put on and taking off regular lower body clothing?: A Lot 6 Click Score: 16   End of Session Equipment Utilized During Treatment: Gait belt;Rolling walker Nurse Communication: Mobility status  Activity Tolerance: Patient tolerated treatment well;Patient limited by pain;Patient limited by fatigue Patient left: in bed;with  call bell/phone within reach;with bed alarm set  OT Visit Diagnosis: Unsteadiness on feet (R26.81);Muscle weakness (generalized) (M62.81);Pain                Time: 7829-5621 OT Time Calculation (min): 33 min Charges:  OT General Charges $OT Visit: 1 Visit OT Evaluation $OT Eval Moderate Complexity: 1 Mod  Flora Lipps, OTR/L Acute Rehabilitation Services Pager: [phone] Office: [phone]   Dannell Gortney C 01/26/2020, 4:15 PM

## 2020-01-26 NOTE — Progress Notes (Signed)
PROGRESS NOTE  CHANETTE KILL [id number] DOB: 14-May-1955 DOA: 01/25/2020 PCP: Heywood Bene, PA-C  HPI/Recap of past 37 hours: 64 year old female with past medical history significant for hypertension, hypothyroidism, COVID-19 infection this past April and chronic pain who underwent a L5/S1 interbody fusion for foraminal stenosis with radiculopathy on August 31.  Following the procedure, patient states that she went home and was barely able to walk.  While pain had much improved, she was having right-sided numbness from the knee down to her toes as well as feelings of pins-and-needles in her right foot.  Patient had had physical therapy coming to her house.  She also underwent shock therapy for her mood disorder 5 days prior.  The day after she was fine, but then she says 3 days ago she was unable to get up.  She tried to get out of bed and slid to the floor where she lived for 15 hours until paramedics were called.  They were able to get her back to bed, and she did not want to come into the hospital.  The following day, she was try to get up to go the bathroom again felt but was able to call family to call 911 and this time she came to the hospital which was 12/21.  Patient was evaluated and found to have a CK level of 3004.  MRI of the lumbar spine noted recent fusion with regressing noncompressive fluid collection marrow edema and some mild L4-5 right foraminal impingement.  Patient was admitted to the hospital service for rhabdomyolysis.  Patient was evaluated by neurosurgery and after review of MRI, felt that patient's main issues were from deconditioning and weakness as she has had virtually no activity in the last few days.  Cleared for getting out of bed.  Seen by PT and OT who recommended inpatient rehab and now consultation pending.  Patient complains of some pain, especially in her right leg, but otherwise doing well.  Assessment/Plan: Principal Problem:   Generalized  weakness Active Problems:   Essential hypertension, benign: Stable continue home medications.    Mood disorder in conditions classified elsewhere: Status post shock therapy.  Continue home medications.  Morbid obesity: Patient with poor appetite and weight loss.  Reported that she has lost over 40 pounds since April because of Covid.  Criteria for morbid obesity given BMI greater than 40.  Prealbumin and albumin low.  Will get nutrition consult.    Rhabdomyolysis: Secondary to prolonged immobilization on floor 2 days ago.  Continue IV fluids, follow-up CK level in the morning.   Lumbar nerve root impingement: No significant impingement.  Cleared by neurosurgery.    Bradycardia: Heart rate on admission noted to be 40s to 50s.  Keep on telemetry.    Hypokalemia: Replace as needed.    Anxiety   Code Status: Full code  Family Communication: Left message for family  Disposition Plan: Potential transfer to inpatient rehab if accepted   Consultants:  Neurosurgery  Physical medicine rehab  Procedures:  None  Antimicrobials:  None  DVT prophylaxis: Lovenox   Objective: Vitals:   01/26/20 0622 01/26/20 0933  BP: (!) 97/54 117/66  Pulse: (!) 59 70  Resp: 17 18  Temp: 98.9 F (37.2 C) 97.7 F (36.5 C)  SpO2: 91% 92%    Intake/Output Summary (Last 24 hours) at 01/26/2020 1640 Last data filed at 01/26/2020 1418 Gross per 24 hour  Intake 3195 ml  Output 1000 ml  Net 2195 ml   Filed  Weights   01/25/20 1507  Weight: 111.1 kg   Body mass index is 44.81 kg/m.  Exam:   General: Alert and oriented x3, no acute distress  HEENT: Normocephalic and atraumatic, mucous membranes are slightly dry  Cardiovascular: Regular rate and rhythm, S1-S2  Respiratory: Clear auscultation bilaterally  Abdomen: Soft, nontender, nondistended, positive bowel sounds  Musculoskeletal: No clubbing or cyanosis or edema  Skin: No skin breaks, tears or lesions  Psychiatry:  Appropriate, no evidence of psychoses   Data Reviewed: CBC: Recent Labs  Lab 01/25/20 0335 01/26/20 0405  WBC 6.4 4.5  NEUTROABS 5.6  --   HGB 11.9* 11.1*  HCT 39.3 36.7  MCV 84.2 85.3  PLT 149* PLATELET CLUMPS NOTED ON SMEAR, UNABLE TO ESTIMATE   Basic Metabolic Panel: Recent Labs  Lab 01/25/20 0335 01/26/20 0405  NA 134* 134*  K 3.1* 3.5  CL 95* 101  CO2 24 21*  GLUCOSE 175* 119*  BUN 23 15  CREATININE 0.98 0.77  CALCIUM 8.1* 7.7*   GFR: Estimated Creatinine Clearance: 81.4 mL/min (by C-G formula based on SCr of 0.77 mg/dL). Liver Function Tests: Recent Labs  Lab 01/26/20 0405  AST 51*  ALT 33  ALKPHOS 140*  BILITOT 1.0  PROT 5.2*  ALBUMIN 2.2*   No results for input(s): LIPASE, AMYLASE in the last 168 hours. No results for input(s): AMMONIA in the last 168 hours. Coagulation Profile: No results for input(s): INR, PROTIME in the last 168 hours. Cardiac Enzymes: Recent Labs  Lab 01/25/20 0335 01/26/20 0405  CKTOTAL 3,004* 964*   BNP (last 3 results) No results for input(s): PROBNP in the last 8760 hours. HbA1C: No results for input(s): HGBA1C in the last 72 hours. CBG: No results for input(s): GLUCAP in the last 168 hours. Lipid Profile: No results for input(s): CHOL, HDL, LDLCALC, TRIG, CHOLHDL, LDLDIRECT in the last 72 hours. Thyroid Function Tests: Recent Labs    01/25/20 1642  TSH 2.906   Anemia Panel: No results for input(s): VITAMINB12, FOLATE, FERRITIN, TIBC, IRON, RETICCTPCT in the last 72 hours. Urine analysis:    Component Value Date/Time   COLORURINE YELLOW 12/13/2016 1102   APPEARANCEUR CLEAR 12/13/2016 1102   LABSPEC 1.018 12/13/2016 1102   PHURINE 5.0 12/13/2016 1102   GLUCOSEU NEGATIVE 12/13/2016 1102   HGBUR NEGATIVE 12/13/2016 1102   BILIRUBINUR NEGATIVE 12/13/2016 1102   BILIRUBINUR neg 03/07/2014 1459   KETONESUR NEGATIVE 12/13/2016 1102   PROTEINUR 30 (A) 12/13/2016 1102   UROBILINOGEN negative 03/07/2014 1459    UROBILINOGEN 0.2 03/04/2013 1710   NITRITE NEGATIVE 12/13/2016 1102   LEUKOCYTESUR TRACE (A) 12/13/2016 1102   Sepsis Labs: @LABRCNTIP (procalcitonin:4,lacticidven:4)  ) Recent Results (from the past 240 hour(s))  Resp Panel by RT-PCR (Flu A&B, Covid) Nasopharyngeal Swab     Status: None   Collection Time: 01/25/20  6:12 AM   Specimen: Nasopharyngeal Swab; Nasopharyngeal(NP) swabs in vial transport medium  Result Value Ref Range Status   SARS Coronavirus 2 by RT PCR NEGATIVE NEGATIVE Final    Comment: (NOTE) SARS-CoV-2 target nucleic acids are NOT DETECTED.  The SARS-CoV-2 RNA is generally detectable in upper respiratory specimens during the acute phase of infection. The lowest concentration of SARS-CoV-2 viral copies this assay can detect is 138 copies/mL. A negative result does not preclude SARS-Cov-2 infection and should not be used as the sole basis for treatment or other patient management decisions. A negative result may occur with  improper specimen collection/handling, submission of specimen other than nasopharyngeal swab,  presence of viral mutation(s) within the areas targeted by this assay, and inadequate number of viral copies(<138 copies/mL). A negative result must be combined with clinical observations, patient history, and epidemiological information. The expected result is Negative.  Fact Sheet for Patients:  EntrepreneurPulse.com.au  Fact Sheet for Healthcare Providers:  IncredibleEmployment.be  This test is no t yet approved or cleared by the [place] FDA and  has been authorized for detection and/or diagnosis of SARS-CoV-2 by FDA under an Emergency Use Authorization (EUA). This EUA will remain  in effect (meaning this test can be used) for the duration of the COVID-19 declaration under Section 564(b)(1) of the Act, 21 U.S.C.section 360bbb-3(b)(1), unless the authorization is terminated  or revoked sooner.        Influenza A by PCR NEGATIVE NEGATIVE Final   Influenza B by PCR NEGATIVE NEGATIVE Final    Comment: (NOTE) The Xpert Xpress SARS-CoV-2/FLU/RSV plus assay is intended as an aid in the diagnosis of influenza from Nasopharyngeal swab specimens and should not be used as a sole basis for treatment. Nasal washings and aspirates are unacceptable for Xpert Xpress SARS-CoV-2/FLU/RSV testing.  Fact Sheet for Patients: EntrepreneurPulse.com.au  Fact Sheet for Healthcare Providers: IncredibleEmployment.be  This test is not yet approved or cleared by the [place] FDA and has been authorized for detection and/or diagnosis of SARS-CoV-2 by FDA under an Emergency Use Authorization (EUA). This EUA will remain in effect (meaning this test can be used) for the duration of the COVID-19 declaration under Section 564(b)(1) of the Act, 21 U.S.C. section 360bbb-3(b)(1), unless the authorization is terminated or revoked.  Performed at [facility], [place] [address], [place], [place] [address]       Studies: No results found.  Scheduled Meds: . aspirin EC  81 mg Oral Daily  . busPIRone  20 mg Oral BID  . enoxaparin (LOVENOX) injection  40 mg Subcutaneous Q24H  . FLUoxetine  40 mg Oral Daily  . gabapentin  300 mg Oral QHS  . hydrOXYzine  50 mg Oral Daily   And  . hydrOXYzine  100 mg Oral QHS  . lamoTRIgine  100 mg Oral Daily  . QUEtiapine  100 mg Oral QHS  . sodium chloride flush  3 mL Intravenous Q12H    Continuous Infusions:   LOS: 1 day     Annita Brod, MD [facility]   01/26/2020, 4:40 PM

## 2020-01-26 NOTE — Progress Notes (Signed)
Rehab Admissions Coordinator Note:  Patient was screened by Cleatrice Burke for appropriateness for an Inpatient Acute Rehab Consult per therapy recommendations.  At this time, we are recommending Inpatient Rehab consult to assess CIR vs SNF needs. I will place order per protocol.Cleatrice Burke RN MSN 01/26/2020, 3:44 PM  I can be reached at [phone].

## 2020-01-26 NOTE — Progress Notes (Signed)
Providing Compassionate, Quality Care - Together   Subjective: Patient reports back pain that radiates into her right lower extremity.  Objective: Vital signs in last 24 hours: Temp:  [97.7 F (36.5 C)-98.9 F (37.2 C)] 97.7 F (36.5 C) (12/22 0933) Pulse Rate:  [48-73] 70 (12/22 0933) Resp:  [14-20] 18 (12/22 0933) BP: (92-119)/(49-71) 117/66 (12/22 0933) SpO2:  [91 %-96 %] 92 % (12/22 0933) Weight:  [111.1 kg] 111.1 kg (12/21 1507)  Intake/Output from previous day: 12/21 0701 - 12/22 0700 In: 2405 [P.O.:120; I.V.:2235; IV Piggyback:50] Out: 650 [Urine:650] Intake/Output this shift: No intake/output data recorded.  Alert and oriented x 4 PERRLA CN II-XII grossly intact MAE, Right dorsiflexion 4/5,right hip flexor 3/5 otherwise strength appears intact  Surgical site well-approximated   Lab Results: Recent Labs    01/25/20 0335 01/26/20 0405  WBC 6.4 4.5  HGB 11.9* 11.1*  HCT 39.3 36.7  PLT 149* PLATELET CLUMPS NOTED ON SMEAR, UNABLE TO ESTIMATE   BMET Recent Labs    01/25/20 0335 01/26/20 0405  NA 134* 134*  K 3.1* 3.5  CL 95* 101  CO2 24 21*  GLUCOSE 175* 119*  BUN 23 15  CREATININE 0.98 0.77  CALCIUM 8.1* 7.7*    Studies/Results: MR Lumbar Spine W Wo Contrast  Result Date: 01/25/2020 CLINICAL DATA:  New lumbar radiculopathy. History of prior surgery. History of L5-S1 fusion 10/05/2019. Fall from bed going to the bathroom with lower back pain EXAM: MRI LUMBAR SPINE WITHOUT AND WITH CONTRAST TECHNIQUE: Multiplanar and multiecho pulse sequences of the lumbar spine were obtained without and with intravenous contrast. CONTRAST:  23mL GADAVIST GADOBUTROL 1 MMOL/ML IV SOLN COMPARISON:  12/29/2019 FINDINGS: Segmentation:  5 lumbar type vertebrae Alignment:  Physiologic Vertebrae: Recent L5-S1 fusion. No gross change in hardware positioning. The noncompressive fluid collection at the laminectomy defect and posterior to the left facetectomy has regressed.  There is still perineural fat effacement at the left more than right L5-S1 foramina, likely perineural granulation tissue. Due to cage subsidence and residual ridging there is still bony foraminal narrowing on both sides. No convincing change in vertebral body marrow signal about the cage which is at least partially related to subsidence seen on 12/16/2019 radiograph. Would expect regression of marrow edema related to standard postoperative changes. There is mild presacral edema which is mildly increased. Remote L4 superior endplate fracture. No overt infection or acute fracture Conus medullaris and cauda equina: Conus extends to the L1-2 level. Conus and cauda equina appear normal. Paraspinal and other soft tissues: Decreased fluid collection as noted above at the level of surgery. There is stable posterior granulation tissue. Gallstone versus volume averaging of the gallbladder wall. Disc levels: Stable residual bony foraminal narrowings at L5-S1, as noted above. L4-5 asymmetric rightward disc collapse and endplate and facet ridging with right L4 foraminal impingement. L3-4 disc bulging and mild degenerative facet spurring without impingement. IMPRESSION: 1. No acute finding. 2. Recent L5-S1 fusion with regressing noncompressive fluid collection. There is still marrow edema about the intervertebral cage which is likely related to the subsidence noted on a 12/16/2019 radiograph. 3. Unchanged presumed exuberant granulation tissue around the left foraminal L5 nerve root. 4. L4-5 right foraminal impingement. Electronically Signed   By: Monte Fantasia M.D.   On: 01/25/2020 05:13    Assessment/Plan: Patient with recent lumbar fusion and new right lowe extremity numbness and weakness. Patient developed rhabdomyolysis secondary to prolonged immobilization following a fall. MRI with no changes compared to MRI completed on  12/29/2019. There were no significant areas of stenosis noted.   LOS: 1 day   -Continue  efforts at pain control -Patient would greatly benefit from further physical rehabilitation   Val Eagle, DNP, AGNP-C Nurse Practitioner  [facility] [address], [place], [place] [address] P: [phone]    F: [phone]  01/26/2020, 11:55 AM

## 2020-01-27 DIAGNOSIS — I10 Essential (primary) hypertension: Secondary | ICD-10-CM | POA: Diagnosis not present

## 2020-01-27 DIAGNOSIS — F0630 Mood disorder due to known physiological condition, unspecified: Secondary | ICD-10-CM | POA: Diagnosis not present

## 2020-01-27 DIAGNOSIS — M6282 Rhabdomyolysis: Secondary | ICD-10-CM | POA: Diagnosis not present

## 2020-01-27 DIAGNOSIS — R531 Weakness: Secondary | ICD-10-CM | POA: Diagnosis not present

## 2020-01-27 LAB — BASIC METABOLIC PANEL
Anion gap: 10 (ref 5–15)
BUN: 12 mg/dL (ref 8–23)
CO2: 21 mmol/L — ABNORMAL LOW (ref 22–32)
Calcium: 7.5 mg/dL — ABNORMAL LOW (ref 8.9–10.3)
Chloride: 106 mmol/L (ref 98–111)
Creatinine, Ser: 0.73 mg/dL (ref 0.44–1.00)
GFR, Estimated: >60 mL/min (ref >60)
Glucose, Bld: 128 mg/dL — ABNORMAL HIGH (ref 70–99)
Potassium: 2.8 mmol/L — ABNORMAL LOW (ref 3.5–5.1)
Sodium: 137 mmol/L (ref 135–145)

## 2020-01-27 LAB — CBC
HCT: 30.8 % — ABNORMAL LOW (ref 36.0–46.0)
Hemoglobin: 9.9 g/dL — ABNORMAL LOW (ref 12.0–15.0)
MCH: 26.8 pg (ref 26.0–34.0)
MCHC: 32.1 g/dL (ref 30.0–36.0)
MCV: 83.5 fL (ref 80.0–100.0)
Platelets: UNDETERMINED 10*3/uL (ref 150–400)
RBC: 3.69 MIL/uL — ABNORMAL LOW (ref 3.87–5.11)
RDW: 16.3 % — ABNORMAL HIGH (ref 11.5–15.5)
WBC: 4.9 10*3/uL (ref 4.0–10.5)
nRBC: 0 % (ref 0.0–0.2)

## 2020-01-27 LAB — CK: Total CK: 559 U/L — ABNORMAL HIGH (ref 38–234)

## 2020-01-27 MED ORDER — POTASSIUM CHLORIDE CRYS ER 20 MEQ PO TBCR
40.0000 meq | EXTENDED_RELEASE_TABLET | Freq: Two times a day (BID) | ORAL | Status: AC
  Administered 2020-01-27 (×2): 40 meq via ORAL
  Filled 2020-01-27 (×2): qty 2

## 2020-01-27 MED ORDER — SODIUM CHLORIDE 0.9 % IV SOLN: INTRAVENOUS | Status: DC

## 2020-01-27 MED ORDER — ENSURE MAX PROTEIN PO LIQD
11.0000 [oz_av] | Freq: Two times a day (BID) | ORAL | Status: DC
  Administered 2020-01-27 – 2020-02-06 (×16): 11 [oz_av] via ORAL
  Filled 2020-01-27 (×24): qty 330

## 2020-01-27 MED ORDER — ADULT MULTIVITAMIN W/MINERALS CH
1.0000 | ORAL_TABLET | Freq: Every day | ORAL | Status: DC
  Administered 2020-01-27 – 2020-02-07 (×12): 1 via ORAL
  Filled 2020-01-27 (×12): qty 1

## 2020-01-27 NOTE — Progress Notes (Signed)
Initial Nutrition Assessment  DOCUMENTATION CODES:   Non-severe (moderate) malnutrition in context of chronic illness,Morbid obesity  INTERVENTION:   Ensure Max BID, each supplement provides 150 kcal and 30 grams of protein.   MVI with minerals   NUTRITION DIAGNOSIS:   Moderate Malnutrition related to chronic illness (chronic pain impacting mobility) as evidenced by mild muscle depletion,moderate muscle depletion,moderate fat depletion,mild fat depletion.  GOAL:   Patient will meet greater than or equal to 90% of their needs  MONITOR:   PO intake,Supplement acceptance,Labs,Weight trends  REASON FOR ASSESSMENT:   Consult Assessment of nutrition requirement/status  ASSESSMENT:   64 y.o. female with PMH of HTN, hypothyroidism, COVID-19 infection in April, and chronic pain who underwent L5/S1 fusion in August. Presents after fall, lying on floor x 15 hours. Admitted for R sided weakness and rhabdomyolysis secondary to prolonged immobility.  Pt stated her appetite has been decreased for the last two weeks. Pt stated she usually eats 4 small meals a day. In the past two weeks she has been consuming 2 small meals a day. Pt could not provide detailed dietary recall. Pt reports she has been very thirsty the past two weeks. Pt reports drinking water and diet green tea at home. She denies drinking any ONS. Per chart review, pt consumed 50-75% of documented meals.   Per chart review, pt had gastric bypass surgery in 2011. Pt reports before this surgery she weighed 378 lbs. She reports her current weight is 205 lbs. Pt reports a 40 lb weight loss after having COVID in April. Per chart review on Care Everywhere, pt weighed 243 lbs on 07/13/19. On 09/24/19, pt weighed 240 lbs. She weighed 218 lbs on 11/30/19 at an outpatient appointment. Pt weight at admission was 245 lbs but this appears to be stated rather than measured. Weight fluctuations likely due to fluid accumulation.   Pt stated she was  able to ambulate with a walker until a few days ago when her weakness and R-sided numbness worsened.   Labs reviewed: Potassium 2.8, CK 559  Medications reviewed and include: NS at 75 mL/hr, Neurotonin    NUTRITION - FOCUSED PHYSICAL EXAM:  Flowsheet Row Most Recent Value  Orbital Region Mild depletion  Upper Arm Region Moderate depletion  Thoracic and Lumbar Region No depletion  Buccal Region Mild depletion  Temple Region Mild depletion  Clavicle Bone Region Moderate depletion  Clavicle and Acromion Bone Region Moderate depletion  Scapular Bone Region Mild depletion  Dorsal Hand Mild depletion  Patellar Region Mild depletion  Anterior Thigh Region Mild depletion  Posterior Calf Region Mild depletion  Edema (RD Assessment) Mild  Hair Reviewed  Eyes Reviewed  Mouth Reviewed  Skin Reviewed  Nails Reviewed       Diet Order:   Diet Order            Diet regular Room service appropriate? Yes; Fluid consistency: Thin  Diet effective now                 EDUCATION NEEDS:   No education needs have been identified at this time  Skin:  Skin Assessment: Reviewed RN Assessment  Last BM:  01/22/20  Height:   Ht Readings from Last 1 Encounters:  01/25/20 5\' 2"  (1.575 m)    Weight:   Wt Readings from Last 1 Encounters:  01/25/20 111.1 kg    Ideal Body Weight:  50 kg  BMI:  Body mass index is 44.81 kg/m.  Estimated Nutritional Needs:   Kcal:  1900-2100 kcal  Protein:  105-125 grams  Fluid:  >2 L/day    Ronnald Nian, Dietetic Intern Pager: [phone] If unavailable: [phone]

## 2020-01-27 NOTE — Progress Notes (Signed)
   Providing Compassionate, Quality Care - Together   Subjective: Patient reports no issues overnight. She feels like she is gradually improving.  Objective: Vital signs in last 24 hours: Temp:  [98.3 F (36.8 C)-99.5 F (37.5 C)] 98.7 F (37.1 C) (12/23 0944) Pulse Rate:  [63-77] 63 (12/23 0944) Resp:  [16-20] 18 (12/23 0944) BP: (115-142)/(59-95) 115/59 (12/23 0944) SpO2:  [91 %-95 %] 95 % (12/23 0944)  Intake/Output from previous day: 12/22 0701 - 12/23 0700 In: 840 [P.O.:840] Out: 550 [Urine:550] Intake/Output this shift: Total I/O In: 120 [P.O.:120] Out: 150 [Urine:150]  Alert and oriented x 4 PERRLA CN II-XII grossly intact MAE, Right dorsiflexion 4/5,right hip flexor 3/5 otherwise strength appears intact Surgical site well-approximated  Lab Results: Recent Labs    01/26/20 0405 01/27/20 0151  WBC 4.5 4.9  HGB 11.1* 9.9*  HCT 36.7 30.8*  PLT PLATELET CLUMPS NOTED ON SMEAR, UNABLE TO ESTIMATE PLATELET CLUMPS NOTED ON SMEAR, UNABLE TO ESTIMATE   BMET Recent Labs    01/26/20 0405 01/27/20 0151  NA 134* 137  K 3.5 2.8*  CL 101 106  CO2 21* 21*  GLUCOSE 119* 128*  BUN 15 12  CREATININE 0.77 0.73  CALCIUM 7.7* 7.5*    Studies/Results: No results found.  Assessment/Plan: Patient with recent lumbar fusion and new right lowe extremity numbness and weakness. Patient developed rhabdomyolysis secondary to prolonged immobilization following a fall. MRI with no changes compared to MRI completed on 12/29/2019. There were no significant areas of stenosis noted.   LOS: 2 days    -Patient gradually improving -Agree with efforts at physical rehabilitation -Neurosurgery will sign off at this time. Please re-consult if we can be of further assistance.   Viona Gilmore, DNP, AGNP-C Nurse Practitioner  [facility] [place] [address], [place], [place] [address] P: [phone]    F: [phone]  01/27/2020, 2:17  PM

## 2020-01-27 NOTE — Progress Notes (Addendum)
PROGRESS NOTE  Tamara HASPEL [id number] DOB: 1955/03/30 DOA: 01/25/2020 PCP: Heywood Bene, PA-C  HPI/Recap of past 21 hours: 64 year old female with past medical history significant for hypertension, hypothyroidism, COVID-19 infection this past April and chronic pain who underwent a L5/S1 interbody fusion for foraminal stenosis with radiculopathy on August 31.  Following the procedure, patient states that she went home and was barely able to walk.  While pain had much improved, she was having right-sided numbness from the knee down to her toes as well as feelings of pins-and-needles in her right foot.  Patient had had physical therapy coming to her house.  She also underwent shock therapy for her mood disorder 5 days prior.  The day after she was fine, but then she says 3 days ago she was unable to get up.  She tried to get out of bed and slid to the floor where she lived for 15 hours until paramedics were called.  They were able to get her back to bed, and she did not want to come into the hospital.  The following day, she was try to get up to go the bathroom again felt but was able to call family to call 911 and this time she came to the hospital which was 12/21.  Patient was evaluated and found to have a CK level of 3004.  MRI of the lumbar spine noted recent fusion with regressing noncompressive fluid collection marrow edema and some mild L4-5 right foraminal impingement.  Patient was admitted to the hospital service for rhabdomyolysis.  Patient was evaluated by neurosurgery and after review of MRI, felt that patient's main issues were from deconditioning and weakness as she has had virtually no activity in the last few days.  PT and OT recommending CIR. after discussion with inpatient rehab physician, patient opted for SNF.  Social work consulted.  Assessment/Plan: Principal Problem:   Generalized weakness Active Problems:   Essential hypertension, benign: Continue to remain  stable.  Continue home medications.    Mood disorder in conditions classified elsewhere: Status post shock therapy.  Continue home medications.  Morbid obesity: Patient with poor appetite and weight loss.  Reported that she has lost over 40 pounds since April because of Covid.  Criteria for morbid obesity given BMI greater than 40.  Prealbumin and albumin low.  Will get nutrition consult.    Rhabdomyolysis: Secondary to prolonged immobilization on floor 2 days ago.  CPK continues to trend downward.    Lumbar nerve root impingement: No significant impingement.  Cleared by neurosurgery.    Bradycardia: Heart rate on admission noted to be 40s to 50s.  Much improved, now 60s to 70s    Hypokalemia: Replace as needed.    Anxiety  Alcohol abuse: Updated daughter by phone who informed us that patient has issues with alcohol abuse.  She was glad that the patient is in the hospital and from here is going to skilled facility which will help keep her away from alcohol.  She is pretty close to being out of the window for withdrawals, but will still watch regardless.   Code Status: Full code  Family Communication: Updated daughter by phone.   Disposition Plan: Social work consulted for skilled nursing    Consultants:  Neurosurgery  Physical medicine rehab  Procedures:  None  Antimicrobials:  None  DVT prophylaxis: Lovenox   Objective: Vitals:   01/27/20 0436 01/27/20 0944  BP: 128/66 (!) 115/59  Pulse: 65 63  Resp: 16 18  Temp: 98.3 F (36.8 C) 98.7 F (37.1 C)  SpO2: 91% 95%    Intake/Output Summary (Last 24 hours) at 01/27/2020 1305 Last data filed at 01/27/2020 0805 Gross per 24 hour  Intake 600 ml  Output 550 ml  Net 50 ml   Filed Weights   01/25/20 1507  Weight: 111.1 kg   Body mass index is 44.81 kg/m.  Exam:   General: Alert and oriented x3, no acute distress  HEENT: Normocephalic and atraumatic, mucous membranes moist  Cardiovascular: Regular  rate and rhythm, S1-S2  Respiratory: Clear auscultation bilaterally  Abdomen: Soft, nontender, nondistended, positive bowel sounds  Musculoskeletal: No clubbing or cyanosis or edema  Skin: No skin breaks, tears or lesions  Psychiatry: Appropriate, no evidence of psychoses   Data Reviewed: CBC: Recent Labs  Lab 01/25/20 0335 01/26/20 0405 01/27/20 0151  WBC 6.4 4.5 4.9  NEUTROABS 5.6  --   --   HGB 11.9* 11.1* 9.9*  HCT 39.3 36.7 30.8*  MCV 84.2 85.3 83.5  PLT 149* PLATELET CLUMPS NOTED ON SMEAR, UNABLE TO ESTIMATE PLATELET CLUMPS NOTED ON SMEAR, UNABLE TO ESTIMATE   Basic Metabolic Panel: Recent Labs  Lab 01/25/20 0335 01/26/20 0405 01/27/20 0151  NA 134* 134* 137  K 3.1* 3.5 2.8*  CL 95* 101 106  CO2 24 21* 21*  GLUCOSE 175* 119* 128*  BUN 23 15 12   CREATININE 0.98 0.77 0.73  CALCIUM 8.1* 7.7* 7.5*   GFR: Estimated Creatinine Clearance: 81.4 mL/min (by C-G formula based on SCr of 0.73 mg/dL). Liver Function Tests: Recent Labs  Lab 01/26/20 0405  AST 51*  ALT 33  ALKPHOS 140*  BILITOT 1.0  PROT 5.2*  ALBUMIN 2.2*   No results for input(s): LIPASE, AMYLASE in the last 168 hours. No results for input(s): AMMONIA in the last 168 hours. Coagulation Profile: No results for input(s): INR, PROTIME in the last 168 hours. Cardiac Enzymes: Recent Labs  Lab 01/25/20 0335 01/26/20 0405 01/27/20 0151  CKTOTAL 3,004* 964* 559*   BNP (last 3 results) No results for input(s): PROBNP in the last 8760 hours. HbA1C: No results for input(s): HGBA1C in the last 72 hours. CBG: No results for input(s): GLUCAP in the last 168 hours. Lipid Profile: No results for input(s): CHOL, HDL, LDLCALC, TRIG, CHOLHDL, LDLDIRECT in the last 72 hours. Thyroid Function Tests: Recent Labs    01/25/20 1642  TSH 2.906   Anemia Panel: No results for input(s): VITAMINB12, FOLATE, FERRITIN, TIBC, IRON, RETICCTPCT in the last 72 hours. Urine analysis:    Component Value  Date/Time   COLORURINE YELLOW 12/13/2016 1102   APPEARANCEUR CLEAR 12/13/2016 1102   LABSPEC 1.018 12/13/2016 1102   PHURINE 5.0 12/13/2016 1102   GLUCOSEU NEGATIVE 12/13/2016 1102   HGBUR NEGATIVE 12/13/2016 1102   BILIRUBINUR NEGATIVE 12/13/2016 1102   BILIRUBINUR neg 03/07/2014 1459   KETONESUR NEGATIVE 12/13/2016 1102   PROTEINUR 30 (A) 12/13/2016 1102   UROBILINOGEN negative 03/07/2014 1459   UROBILINOGEN 0.2 03/04/2013 1710   NITRITE NEGATIVE 12/13/2016 1102   LEUKOCYTESUR TRACE (A) 12/13/2016 1102   Sepsis Labs: @LABRCNTIP (procalcitonin:4,lacticidven:4)  ) Recent Results (from the past 240 hour(s))  Resp Panel by RT-PCR (Flu A&B, Covid) Nasopharyngeal Swab     Status: None   Collection Time: 01/25/20  6:12 AM   Specimen: Nasopharyngeal Swab; Nasopharyngeal(NP) swabs in vial transport medium  Result Value Ref Range Status   SARS Coronavirus 2 by RT PCR NEGATIVE NEGATIVE Final    Comment: (NOTE) SARS-CoV-2 target  nucleic acids are NOT DETECTED.  The SARS-CoV-2 RNA is generally detectable in upper respiratory specimens during the acute phase of infection. The lowest concentration of SARS-CoV-2 viral copies this assay can detect is 138 copies/mL. A negative result does not preclude SARS-Cov-2 infection and should not be used as the sole basis for treatment or other patient management decisions. A negative result may occur with  improper specimen collection/handling, submission of specimen other than nasopharyngeal swab, presence of viral mutation(s) within the areas targeted by this assay, and inadequate number of viral copies(<138 copies/mL). A negative result must be combined with clinical observations, patient history, and epidemiological information. The expected result is Negative.  Fact Sheet for Patients:  EntrepreneurPulse.com.au  Fact Sheet for Healthcare Providers:  IncredibleEmployment.be  This test is no t yet approved  or cleared by the [place] FDA and  has been authorized for detection and/or diagnosis of SARS-CoV-2 by FDA under an Emergency Use Authorization (EUA). This EUA will remain  in effect (meaning this test can be used) for the duration of the COVID-19 declaration under Section 564(b)(1) of the Act, 21 U.S.C.section 360bbb-3(b)(1), unless the authorization is terminated  or revoked sooner.       Influenza A by PCR NEGATIVE NEGATIVE Final   Influenza B by PCR NEGATIVE NEGATIVE Final    Comment: (NOTE) The Xpert Xpress SARS-CoV-2/FLU/RSV plus assay is intended as an aid in the diagnosis of influenza from Nasopharyngeal swab specimens and should not be used as a sole basis for treatment. Nasal washings and aspirates are unacceptable for Xpert Xpress SARS-CoV-2/FLU/RSV testing.  Fact Sheet for Patients: EntrepreneurPulse.com.au  Fact Sheet for Healthcare Providers: IncredibleEmployment.be  This test is not yet approved or cleared by the [place] FDA and has been authorized for detection and/or diagnosis of SARS-CoV-2 by FDA under an Emergency Use Authorization (EUA). This EUA will remain in effect (meaning this test can be used) for the duration of the COVID-19 declaration under Section 564(b)(1) of the Act, 21 U.S.C. section 360bbb-3(b)(1), unless the authorization is terminated or revoked.  Performed at [facility], [place] [address] [place], [place] [address]       Studies: No results found.  Scheduled Meds: . aspirin EC  81 mg Oral Daily  . busPIRone  20 mg Oral BID  . enoxaparin (LOVENOX) injection  40 mg Subcutaneous Q24H  . FLUoxetine  40 mg Oral Daily  . gabapentin  300 mg Oral QHS  . hydrOXYzine  50 mg Oral Daily   And  . hydrOXYzine  100 mg Oral QHS  . lamoTRIgine  100 mg Oral Daily  . potassium chloride  40 mEq Oral BID  . QUEtiapine  100 mg Oral QHS  . sodium chloride flush  3 mL Intravenous Q12H     Continuous Infusions: . sodium chloride 75 mL/hr at 01/27/20 1253     LOS: 2 days     Annita Brod, MD [facility]   01/27/2020, 1:05 PM

## 2020-01-27 NOTE — Progress Notes (Signed)
Inpatient Rehabilitation-Admissions Coordinator   Inpatient Rehab Admissions:  Inpatient Rehab Consult received.  I met with pt at the bedside for rehabilitation assessment. Discussed current recommendations for CIR, expectations in the program, estimated LOS, and assistance recommended at DC. The patient expressed her concern over the short LOS and is preferring a longer program (~20 days). Discussed the difference between CIR and SNF as well as insurance barriers. She has chosen SNF as her preference at this time. Per pt request, contacted her sister Manuela Schwartz to discuss this in detail; she agreed with SNF placement for short term rehab.   AC will sign off and will notify [facility] team of need for new rehab dispo.   Raechel Ache, OTR/L  Rehab Admissions Coordinator  [phone] 01/27/2020 2:00 PM

## 2020-01-28 DIAGNOSIS — F0630 Mood disorder due to known physiological condition, unspecified: Secondary | ICD-10-CM | POA: Diagnosis not present

## 2020-01-28 DIAGNOSIS — R531 Weakness: Secondary | ICD-10-CM | POA: Diagnosis not present

## 2020-01-28 DIAGNOSIS — I10 Essential (primary) hypertension: Secondary | ICD-10-CM | POA: Diagnosis not present

## 2020-01-28 DIAGNOSIS — E440 Moderate protein-calorie malnutrition: Secondary | ICD-10-CM | POA: Insufficient documentation

## 2020-01-28 DIAGNOSIS — M6282 Rhabdomyolysis: Secondary | ICD-10-CM | POA: Diagnosis not present

## 2020-01-28 LAB — CBC
HCT: 30.5 % — ABNORMAL LOW (ref 36.0–46.0)
Hemoglobin: 10 g/dL — ABNORMAL LOW (ref 12.0–15.0)
MCH: 26.5 pg (ref 26.0–34.0)
MCHC: 32.8 g/dL (ref 30.0–36.0)
MCV: 80.9 fL (ref 80.0–100.0)
Platelets: 122 10*3/uL — ABNORMAL LOW (ref 150–400)
RBC: 3.77 MIL/uL — ABNORMAL LOW (ref 3.87–5.11)
RDW: 16.2 % — ABNORMAL HIGH (ref 11.5–15.5)
WBC: 5.8 10*3/uL (ref 4.0–10.5)
nRBC: 0 % (ref 0.0–0.2)

## 2020-01-28 LAB — BASIC METABOLIC PANEL
Anion gap: 10 (ref 5–15)
BUN: 10 mg/dL (ref 8–23)
CO2: 22 mmol/L (ref 22–32)
Calcium: 7.6 mg/dL — ABNORMAL LOW (ref 8.9–10.3)
Chloride: 105 mmol/L (ref 98–111)
Creatinine, Ser: 0.67 mg/dL (ref 0.44–1.00)
GFR, Estimated: >60 mL/min (ref >60)
Glucose, Bld: 109 mg/dL — ABNORMAL HIGH (ref 70–99)
Potassium: 3.6 mmol/L (ref 3.5–5.1)
Sodium: 137 mmol/L (ref 135–145)

## 2020-01-28 NOTE — Progress Notes (Addendum)
Occupational Therapy Treatment Patient Details Name: Tamara Mccullough MRN: [id number] DOB: 08-26-1955 Today's Date: 01/28/2020    History of present illness Pt is a 64yo female s/p fall, lying on floor x15 hours. Found to have rhabdo, R sided weakness. MRI lumbar: L4-5 right foraminal impingementPMHx: L5-S1 sx 09/2019. ACDF 11/2013.   OT comments  Pt continues to present with decreased activity tolerance, strength, and balance. Pt eager to participate in therapy and reporting she needs to use restroom. Pt requiring Min Guard A for functional mobility with RW and for safety in toilet transfer. Pt requiring Mod A for toilet hygiene after BM. Pt performing hand hygiene at sink with Min Guard A. Pt motivated to perform mobility in hallway, but reporting increased fatigue after toileting. Request return to bed and is eager to walk with PT in hallway later today. Continue to recommend dc to post-acute rehab and pt wanting to dc to SNF for longer rehab. Will continue to follow acutely acutely as admitted.    Follow Up Recommendations  CIR;SNF - Feel pt would continue to benefit from CIR. However, pt wanting SNF for longer rehab prior to return home.   Equipment Recommendations  3 in 1 bedside commode    Recommendations for Other Services      Precautions / Restrictions Precautions Precautions: Fall;Other (comment) Precaution Comments: multiple falls in past 6 mos       Mobility Bed Mobility Overal bed mobility: Needs Assistance Bed Mobility: Sit to Supine       Sit to supine: Min assist   General bed mobility comments: Min A for eleavting RLE over EOB  Transfers Overall transfer level: Needs assistance Equipment used: Rolling walker (2 wheeled) Transfers: Sit to/from [employer] Sit to Stand: Min guard         General transfer comment: Min Guard A for safety with sit<>stand    Balance Overall balance assessment: Needs assistance;History of  Falls Sitting-balance support: Bilateral upper extremity supported;Feet supported Sitting balance-Leahy Scale: Fair     Standing balance support: Bilateral upper extremity supported;During functional activity Standing balance-Leahy Scale: Poor Standing balance comment: hx of multiple recent falls, heavy reliance on BUE support, tremulous                           ADL either performed or assessed with clinical judgement   ADL Overall ADL's : Needs assistance/impaired Eating/Feeding: Set up;Sitting   Grooming: Min guard;Wash/dry hands;Standing Grooming Details (indicate cue type and reason): Pt performing hand hygiene at sink with MIn Guard A for safety                 Toilet Transfer: Min guard;Grab bars;Regular Toilet;RW;Ambulation Toilet Transfer Details (indicate cue type and reason): Min guard A for safety. Use of grab bar for safe descent and then to pull into standing. Toileting- Clothing Manipulation and Hygiene: Moderate assistance;Sit to/from stand Toileting - Clothing Manipulation Details (indicate cue type and reason): Pt performing anterior peri care and requiring Mod A for posterior peri care after BM     Functional mobility during ADLs: Min guard;Rolling walker General ADL Comments: Pt contineus to present with decreased acitivty tolerance, strength, and balance     Vision   Vision Assessment?: No apparent visual deficits   Perception     Praxis      Cognition Arousal/Alertness: Awake/alert Behavior During Therapy: WFL for tasks assessed/performed;Flat affect Overall Cognitive Status: Impaired/Different from baseline Area of Impairment: Safety/judgement;Problem solving;Awareness  Safety/Judgement: Decreased awareness of safety;Decreased awareness of deficits Awareness: Emergent Problem Solving: Slow processing;Decreased initiation;Difficulty sequencing;Requires verbal cues General Comments: Pt requiring  increased time for processing and problem solving. Pt presenting with increased awareness for weakness (BLE) stating "I think I may need to wait on the walk because my legs are shaky" (after performing toiletingi n bathroom)        Exercises     Shoulder Instructions       General Comments VSS on RA    Pertinent Vitals/ Pain       Pain Assessment: Faces Faces Pain Scale: Hurts little more Pain Location: back pain Pain Descriptors / Indicators: Aching;Discomfort Pain Intervention(s): Monitored during session;Limited activity within patient's tolerance;Repositioned  Home Living                                          Prior Functioning/Environment              Frequency  Min 2X/week        Progress Toward Goals  OT Goals(current goals can now be found in the care plan section)  Progress towards OT goals: Progressing toward goals  Acute Rehab OT Goals Patient Stated Goal: go to the rehab downstairs Time For Goal Achievement: 02/09/20 Potential to Achieve Goals: Good ADL Goals Pt Will Perform Grooming: with supervision;standing Pt Will Perform Lower Body Dressing: with min guard assist;sitting/lateral leans;sit to/from stand Pt Will Transfer to Toilet: with supervision;ambulating;bedside commode Pt/caregiver will Perform Home Exercise Program: Increased strength;Both right and left upper extremity;With Supervision Additional ADL Goal #1: Pt will state 3 fall prevention strategies to increase independence. Additional ADL Goal #2: Pt with increase to supervisionA for bed mobility to increase independence for ADL.  Plan Discharge plan needs to be updated    Co-evaluation                 AM-PAC OT "6 Clicks" Daily Activity     Outcome Measure   Help from another person eating meals?: None Help from another person taking care of personal grooming?: A Little Help from another person toileting, which includes using toliet, bedpan, or urinal?:  A Lot Help from another person bathing (including washing, rinsing, drying)?: A Lot Help from another person to put on and taking off regular upper body clothing?: A Little Help from another person to put on and taking off regular lower body clothing?: A Lot 6 Click Score: 16    End of Session Equipment Utilized During Treatment: Rolling walker  OT Visit Diagnosis: Unsteadiness on feet (R26.81);Muscle weakness (generalized) (M62.81);Pain   Activity Tolerance Patient tolerated treatment well;Patient limited by fatigue   Patient Left in bed;with call bell/phone within reach   Nurse Communication Mobility status        Time: 0800-0822 OT Time Calculation (min): 22 min  Charges: OT General Charges $OT Visit: 1 Visit OT Treatments $Self Care/Home Management : 8-22 mins  .ccpt   Theodoro Grist Santos [place]wick 01/28/2020, 9:49 AM

## 2020-01-28 NOTE — Progress Notes (Addendum)
    RE:  Tamara Mccullough       Date of Birth: 12/22/1955      Date:   01/28/20       To Whom It May Concern:  Please be advised that the above-named patient will require a short-term nursing home stay - anticipated 30 days or less for rehabilitation and strengthening.  The plan is for return home.  Gevena Barre, MD             MD signature              01/28/20  Date

## 2020-01-28 NOTE — TOC Initial Note (Signed)
Transition of Care [facility]) - Initial/Assessment Note    Patient Details  Name: Tamara Mccullough MRN: [id number] Date of Birth: 04/11/1955  [facility]) CM/SW Contact:    Joanne Chars, LCSW Phone Number: 01/28/2020, 9:35 AM  Clinical Narrative:   CSW met with pt regarding discharge plan.  Pt agreeable to referral to SNF, choice document provided. Permission given to speak with brother, sister, and to send info out in hub for SNF.  Pt is vaccinated for covid.  Pt does have mental health dx and substance abuse dx listed.  Pt reports last use of alcohol one year ago, denies any illegal drug use.  Pt reports she has outpt psychiatrist for med management for anxiety/depression.                  Expected Discharge Plan: Skilled Nursing Facility Barriers to Discharge: Continued Medical Work up,SNF Pending bed offer   Patient Goals and CMS Choice Patient states their goals for this hospitalization and ongoing recovery are:: get back to my house CMS Medicare.gov Compare Post Acute Care list provided to:: Patient Choice offered to / list presented to : Patient  Expected Discharge Plan and Services Expected Discharge Plan: [place] Choice: [place] arrangements for the past 2 months: Single Family Home                                      Prior Living Arrangements/Services Living arrangements for the past 2 months: Single Family Home Lives with:: Self Patient language and need for interpreter reviewed:: Yes Do you feel safe going back to the place where you live?: Yes      Need for Family Participation in Patient Care: No (Comment) Care giver support system in place?: Yes (comment) Current home services: Other (comment) (none) Criminal Activity/Legal Involvement Pertinent to Current Situation/Hospitalization: No - Comment as needed  Activities of Daily Living Home Assistive Devices/Equipment: Walker  (specify type),Bedside commode/3-in-1 ADL Screening (condition at time of admission) Patient's cognitive ability adequate to safely complete daily activities?: Yes Is the patient deaf or have difficulty hearing?: No Does the patient have difficulty seeing, even when wearing glasses/contacts?: No Does the patient have difficulty concentrating, remembering, or making decisions?: No Patient able to express need for assistance with ADLs?: Yes Does the patient have difficulty dressing or bathing?: Yes Independently performs ADLs?: No Communication: Needs assistance Dressing (OT): Needs assistance Is this a change from baseline?: Pre-admission baseline Grooming: Needs assistance Feeding: Needs assistance Is this a change from baseline?: Pre-admission baseline Bathing: Needs assistance Is this a change from baseline?: Pre-admission baseline Toileting: Needs assistance In/Out Bed: Needs assistance Walks in Home: Needs assistance Is this a change from baseline?: Pre-admission baseline Does the patient have difficulty walking or climbing stairs?: Yes Weakness of Legs: Both Weakness of Arms/Hands: None  Permission Sought/Granted Permission sought to share information with : Family Chief Financial Officer Permission granted to share information with : Yes, Verbal Permission Granted  Share Information with NAME: brother Konrad Dolores, sister Manuela Schwartz  Permission granted to share info w AGENCY: SNF        Emotional Assessment Appearance:: Appears stated age Attitude/Demeanor/Rapport: Engaged Affect (typically observed): Appropriate,Pleasant Orientation: : Oriented to Self,Oriented to Place,Oriented to  Time,Oriented to Situation Alcohol / Substance Use: Alcohol Use (by history, last use one year ago per pt) Psych Involvement:  Outpatient Provider (psychiatrist for med management)  Admission diagnosis:  Rhabdomyolysis [M62.82] Traumatic rhabdomyolysis, initial encounter ([place])  [T79.6XXA] Patient Active Problem List   Diagnosis Date Noted  . Malnutrition of moderate degree 01/28/2020  . Generalized weakness 01/26/2020  . Rhabdomyolysis 01/25/2020  . Lumbar nerve root impingement 01/25/2020  . Bradycardia 01/25/2020  . Hypokalemia 01/25/2020  . Anxiety 01/25/2020  . PTSD (post-traumatic stress disorder) 10/14/2017  . Seizure ([place]) 12/13/2016  . Lumbar disc disorder 11/07/2016  . Gastric bypass status for obesity 08/22/2016  . Seizures ([place]) 09/01/2015  . Lumbar foraminal stenosis 07/17/2015  . OA (osteoarthritis) of knee 07/17/2015  . Alcohol use disorder, severe, dependence ([place])   . Insomnia due to anxiety and fear 12/20/2013  . Cervical stenosis of spinal canal 11/09/2013  . RLS (restless legs syndrome) 08/09/2013  . Multinodular goiter 06/09/2013  . Alcoholism in remission ([place]) 03/15/2013  . Essential hypertension, benign 05/20/2012  . GAD (generalized anxiety disorder) 05/20/2012  . Hyperlipidemia with target LDL less than 100 05/20/2012  . Mood disorder in conditions classified elsewhere 05/20/2012  . Anemia, vitamin B12 deficiency 07/06/2009  . Obesity 06/29/2009  . PERSONAL HX COLONIC POLYPS 06/29/2009   PCP:  Heywood Bene, PA-C Pharmacy:   [id number] [place] [id number]: [phone]: [phone]    Social Determinants of Health (SDOH) Interventions    Readmission Risk Interventions No flowsheet data found.

## 2020-01-28 NOTE — NC FL2 (Signed)
[place] LEVEL OF CARE SCREENING TOOL     IDENTIFICATION  Patient Name: Tamara Mccullough Birthdate: 06-25-1955 Sex: female Admission Date (Current Location): 01/25/2020  [facility] and [place] Number:  Herbalist and Address:  [facility]. [facility], [place] [address], [place], [place] [address]      Provider Number: [id number]  Attending Physician Name and Address:  Annita Brod, MD  Relative Name and Phone Number:  Frederico Hamman Brother [phone]    Current Level of Care: Hospital Recommended Level of Care: [place] Prior Approval Number:    Date Approved/Denied:   PASRR Number:    Discharge Plan: SNF    Current Diagnoses: Patient Active Problem List   Diagnosis Date Noted  . Malnutrition of moderate degree 01/28/2020  . Generalized weakness 01/26/2020  . Rhabdomyolysis 01/25/2020  . Lumbar nerve root impingement 01/25/2020  . Bradycardia 01/25/2020  . Hypokalemia 01/25/2020  . Anxiety 01/25/2020  . PTSD (post-traumatic stress disorder) 10/14/2017  . Seizure ([place]) 12/13/2016  . Lumbar disc disorder 11/07/2016  . Gastric bypass status for obesity 08/22/2016  . Seizures ([place]) 09/01/2015  . Lumbar foraminal stenosis 07/17/2015  . OA (osteoarthritis) of knee 07/17/2015  . Alcohol use disorder, severe, dependence ([place])   . Insomnia due to anxiety and fear 12/20/2013  . Cervical stenosis of spinal canal 11/09/2013  . RLS (restless legs syndrome) 08/09/2013  . Multinodular goiter 06/09/2013  . Alcoholism in remission ([place]) 03/15/2013  . Essential hypertension, benign 05/20/2012  . GAD (generalized anxiety disorder) 05/20/2012  . Hyperlipidemia with target LDL less than 100 05/20/2012  . Mood disorder in conditions classified elsewhere 05/20/2012  . Anemia, vitamin B12 deficiency 07/06/2009  . Obesity 06/29/2009  . PERSONAL HX COLONIC POLYPS 06/29/2009    Orientation RESPIRATION BLADDER Height & Weight      Self,Time,Situation,Place  Normal Incontinent Weight: 245 lb (111.1 kg) Height:  5\' 2"  (157.5 cm)  BEHAVIORAL SYMPTOMS/MOOD NEUROLOGICAL BOWEL NUTRITION STATUS      Continent Diet (Regular diet.  See discharge summary)  AMBULATORY STATUS COMMUNICATION OF NEEDS Skin   Supervision Verbally Normal                       Personal Care Assistance Level of Assistance  Bathing,Feeding,Dressing Bathing Assistance: Limited assistance Feeding assistance: Independent Dressing Assistance: Limited assistance     Functional Limitations Info  Sight,Hearing,Speech Sight Info: Adequate Hearing Info: Adequate Speech Info: Adequate    SPECIAL CARE FACTORS FREQUENCY  PT (By licensed PT),OT (By licensed OT)     PT Frequency: 5x week OT Frequency: 5x week            Contractures Contractures Info: Not present    Additional Factors Info  Code Status,Allergies Code Status Info: full Allergies Info: Codeine, Melatonin, Ambien (Zolpidem Tartrate), Eszopiclone, Lansoprazole, Mirapex (Pramipexole), Motrin (Ibuprofen), Prilosec (Omeprazole), Trazodone And Nefazodone           Current Medications (01/28/2020):  This is the current hospital active medication list Current Facility-Administered Medications  Medication Dose Route Frequency Provider Last Rate Last Admin  . 0.9 %  sodium chloride infusion   Intravenous Continuous Annita Brod, MD 75 mL/hr at 01/28/20 0224 New Bag at 01/28/20 0224  . acetaminophen (TYLENOL) tablet 650 mg  650 mg Oral Q6H PRN Fuller Plan A, MD   650 mg at 01/26/20 2131   Or  . acetaminophen (TYLENOL) suppository 650 mg  650 mg Rectal Q6H  PRN Fuller Plan A, MD      . albuterol (PROVENTIL) (2.5 MG/3ML) 0.083% nebulizer solution 2.5 mg  2.5 mg Nebulization Q6H PRN Fuller Plan A, MD      . ALPRAZolam Duanne Moron) tablet 0.25 mg  0.25 mg Oral QHS PRN Fuller Plan A, MD   0.25 mg at 01/27/20 2128  . aspirin EC tablet 81 mg  81 mg Oral Daily Fuller Plan A, MD   81 mg at 01/28/20 0911  . busPIRone (BUSPAR) tablet 20 mg  20 mg Oral BID Fuller Plan A, MD   20 mg at 01/28/20 0912  . diclofenac sodium (VOLTAREN) 1 % transdermal gel 4 g  4 g Topical QID PRN Smith, Rondell A, MD      . enoxaparin (LOVENOX) injection 40 mg  40 mg Subcutaneous Q24H Smith, Rondell A, MD   40 mg at 01/27/20 1252  . FLUoxetine (PROZAC) capsule 40 mg  40 mg Oral Daily Fuller Plan A, MD   40 mg at 01/28/20 0911  . gabapentin (NEURONTIN) capsule 300 mg  300 mg Oral QHS Smith, Rondell A, MD   300 mg at 01/27/20 2128  . gadobutrol (GADAVIST) 1 MMOL/ML injection 10 mL  10 mL Intravenous Once PRN Fuller Plan A, MD      . hydrOXYzine (ATARAX/VISTARIL) tablet 50 mg  50 mg Oral Daily Tamala Julian, Rondell A, MD   50 mg at 01/28/20 0911   And  . hydrOXYzine (ATARAX/VISTARIL) tablet 100 mg  100 mg Oral QHS Smith, Rondell A, MD   100 mg at 01/27/20 2308  . lamoTRIgine (LAMICTAL) tablet 100 mg  100 mg Oral Daily Tamala Julian, Rondell A, MD   100 mg at 01/28/20 0912  . multivitamin with minerals tablet 1 tablet  1 tablet Oral Daily Annita Brod, MD   1 tablet at 01/28/20 0912  . ondansetron (ZOFRAN) tablet 4 mg  4 mg Oral Q6H PRN Fuller Plan A, MD       Or  . ondansetron (ZOFRAN) injection 4 mg  4 mg Intravenous Q6H PRN Smith, Rondell A, MD      . protein supplement (ENSURE MAX) liquid  11 oz Oral BID Annita Brod, MD   11 oz at 01/28/20 0918  . QUEtiapine (SEROQUEL) tablet 100 mg  100 mg Oral QHS Smith, Rondell A, MD   100 mg at 01/27/20 2128  . sodium chloride flush (NS) 0.9 % injection 3 mL  3 mL Intravenous Q12H Smith, Rondell A, MD   3 mL at 01/28/20 0913  . traMADol (ULTRAM) tablet 50 mg  50 mg Oral Q6H PRN Norval Morton, MD   50 mg at 01/28/20 0920     Discharge Medications: Please see discharge summary for a list of discharge medications.  Relevant Imaging Results:  Relevant Lab Results:   Additional Information SSN [id number]  Joanne Chars, LCSW

## 2020-01-28 NOTE — Progress Notes (Signed)
Physical Therapy Treatment Patient Details Name: Tamara Mccullough MRN: [id number] DOB: Jun 27, 1955 Today's Date: 01/28/2020    History of Present Illness Pt is a 64yo female s/p fall, lying on floor x15 hours. Found to have rhabdo, R sided weakness. MRI lumbar: L4-5 right foraminal impingementPMHx: L5-S1 sx 09/2019. ACDF 11/2013.    PT Comments    Patient received in bed, in better spirits today but perhaps still slightly confused as she kept confusing the details of CIR vs SNF. Reports high pain levels but jovial and smiling, making jokes today, but pain did seem to genuinely increase with bed mobility. Able to progress gait distance significantly today but still limited by fatigue; had to take multiple standing rest breaks during gait. Left up in recliner with all needs met, chair alarm active. Will benefit from SNF moving forward.     Follow Up Recommendations  SNF     Equipment Recommendations  Rolling walker with 5" wheels;3in1 (PT);Wheelchair (measurements PT);Wheelchair cushion (measurements PT)    Recommendations for Other Services       Precautions / Restrictions Precautions Precautions: Fall;Other (comment) Precaution Comments: multiple falls in past 6 mos Restrictions Weight Bearing Restrictions: No    Mobility  Bed Mobility Overal bed mobility: Needs Assistance Bed Mobility: Supine to Sit;Rolling Rolling: Mod assist   Supine to sit: Mod assist;+2 for physical assistance Sit to supine: Min assist   General bed mobility comments: still needs heavy physical assistance for bed mobility, this part of session also very limited by pain and needed extended time/increased effort  Transfers Overall transfer level: Needs assistance Equipment used: Rolling walker (2 wheeled) Transfers: Sit to/from Stand Sit to Stand: Min assist         General transfer comment: MinA to boost up to standing from bed with cues for hand placement/sequencing with  RW  Ambulation/Gait Ambulation/Gait assistance: Min guard Gait Distance (Feet): 80 Feet Assistive device: Rolling walker (2 wheeled) Gait Pattern/deviations: Step-through pattern;Decreased step length - right;Decreased step length - left;Decreased stride length;Decreased weight shift to right;Decreased weight shift to left;Trunk flexed;Wide base of support Gait velocity: decreased   General Gait Details: less tremulous today but still with wide BOS; needed multiple standing rest breaks (able to walk 15-26f at a time), had better awareness of her limitations   Stairs             Wheelchair Mobility    Modified Rankin (Stroke Patients Only)       Balance Overall balance assessment: Needs assistance;History of Falls Sitting-balance support: Bilateral upper extremity supported;Feet supported Sitting balance-Leahy Scale: Fair     Standing balance support: Bilateral upper extremity supported;During functional activity Standing balance-Leahy Scale: Poor Standing balance comment: hx of multiple recent falls, heavy reliance on BUE support, tremulous                            Cognition Arousal/Alertness: Awake/alert Behavior During Therapy: Flat affect Overall Cognitive Status: Impaired/Different from baseline Area of Impairment: Safety/judgement;Problem solving;Awareness                     Memory: Decreased short-term memory   Safety/Judgement: Decreased awareness of safety;Decreased awareness of deficits Awareness: Emergent Problem Solving: Slow processing;Decreased initiation;Difficulty sequencing;Requires verbal cues General Comments: still needs increased time for processing/problem solving, better awareness of impairments and what we need to safely walk today (RW and chair follow). Still seems a bit confused as she kept mixing up CIR and  SNF      Exercises      General Comments General comments (skin integrity, edema, etc.): VSS on RA       Pertinent Vitals/Pain Pain Assessment: Faces Faces Pain Scale: Hurts whole lot Pain Location: back pain especially when getting out of bed Pain Descriptors / Indicators: Aching;Discomfort;Grimacing Pain Intervention(s): Limited activity within patient's tolerance;Monitored during session;Repositioned    Home Living                      Prior Function            PT Goals (current goals can now be found in the care plan section) Acute Rehab PT Goals Patient Stated Goal: go to SNF PT Goal Formulation: With patient Time For Goal Achievement: 02/09/20 Potential to Achieve Goals: Fair Progress towards PT goals: Progressing toward goals    Frequency    Min 3X/week      PT Plan Discharge plan needs to be updated    Co-evaluation              AM-PAC PT "6 Clicks" Mobility   Outcome Measure  Help needed turning from your back to your side while in a flat bed without using bedrails?: A Little Help needed moving from lying on your back to sitting on the side of a flat bed without using bedrails?: A Lot Help needed moving to and from a bed to a chair (including a wheelchair)?: A Little Help needed standing up from a chair using your arms (e.g., wheelchair or bedside chair)?: A Little Help needed to walk in hospital room?: A Little Help needed climbing 3-5 steps with a railing? : A Little 6 Click Score: 17    End of Session Equipment Utilized During Treatment: Gait belt Activity Tolerance: Patient limited by fatigue Patient left: in chair;with call bell/phone within reach;with chair alarm set Nurse Communication: Mobility status PT Visit Diagnosis: Unsteadiness on feet (R26.81);Difficulty in walking, not elsewhere classified (R26.2);Repeated falls (R29.6);Muscle weakness (generalized) (M62.81)     Time: 3295-1884 PT Time Calculation (min) (ACUTE ONLY): 24 min  Charges:  $Gait Training: 8-22 mins $Therapeutic Activity: 8-22 mins                      Windell Norfolk, DPT, PN1   Supplemental Physical Therapist [place]    Pager [phone] Acute Rehab Office [phone]

## 2020-01-28 NOTE — Plan of Care (Signed)
  Problem: Health Behavior/Discharge Planning: Goal: Ability to manage health-related needs will improve Outcome: Progressing   Problem: Activity: Goal: Risk for activity intolerance will decrease Outcome: Progressing   Problem: Nutrition: Goal: Adequate nutrition will be maintained Outcome: Progressing   

## 2020-01-28 NOTE — Progress Notes (Signed)
PROGRESS NOTE  KRYSTALL HONER [id number] DOB: 28-Mar-1955 DOA: 01/25/2020 PCP: Heywood Bene, PA-C  HPI/Recap of past 7 hours: 64 year old female with past medical history significant for hypertension, hypothyroidism, COVID-19 infection this past April and chronic pain who underwent a L5/S1 interbody fusion for foraminal stenosis with radiculopathy on August 31.  Following the procedure, patient states that she went home and was barely able to walk.  While pain had much improved, she was having right-sided numbness from the knee down to her toes as well as feelings of pins-and-needles in her right foot.  Patient had had physical therapy coming to her house.  She also underwent shock therapy for her mood disorder 5 days prior.  The day after she was fine, but then she says 3 days ago she was unable to get up.  She tried to get out of bed and slid to the floor where she lived for 15 hours until paramedics were called.  They were able to get her back to bed, and she did not want to come into the hospital.  The following day, she was try to get up to go the bathroom again felt but was able to call family to call 911 and this time she came to the hospital which was 12/21.  Patient was evaluated and found to have a CK level of 3004.  MRI of the lumbar spine noted recent fusion with regressing noncompressive fluid collection marrow edema and some mild L4-5 right foraminal impingement.  Patient was admitted to the hospital service for rhabdomyolysis.  Patient was evaluated by neurosurgery and after review of MRI, felt that patient's main issues were from deconditioning and weakness as she has had virtually no activity in the last few days.  PT and OT recommending CIR. after discussion with inpatient rehab physician, patient opted for SNF.  Social work consulted.  This morning doing okay.  No major complaints mild back pain.  Assessment/Plan: Principal Problem:   Generalized weakness: We will  need aggressive physical therapy looking at skilled nursing rehab beds.  In the meantime, out of bed to chair Active Problems:   Essential hypertension, benign: Continue to remain stable.  Continue home medications.    Mood disorder in conditions classified elsewhere: Status post shock therapy.  Continue home medications.  Morbid obesity: Patient with poor appetite and weight loss.  Reported that she has lost over 40 pounds since April because of Covid.  Criteria for morbid obesity given BMI greater than 40.    Moderate protein calorie malnutrition: In the context of chronic illness which includes her alcoholism as evidenced by mild to moderate fat and muscle depletion.  Seen by nutrition.  Put on Ensure twice daily plus multivitamin.    Rhabdomyolysis: Secondary to prolonged immobilization on floor prior to admission.  CPK continues to trend downward.    Lumbar nerve root impingement: No significant impingement.  Cleared by neurosurgery.    Bradycardia: Heart rate on admission noted to be 40s to 50s.  Much improved, now 60s to 70s    Hypokalemia: Replace as needed.    Anxiety  Alcohol abuse: Updated daughter by phone who informed us that patient has issues with alcohol abuse.  She was glad that the patient is in the hospital and from here is going to skilled facility which will help keep her away from alcohol.  Patient is out of window for withdrawals   Code Status: Full code  Family Communication: Left message for daughter.  Disposition Plan:  Social work consulted for skilled nursing    Consultants:  Neurosurgery  Physical medicine rehab  Procedures:  None  Antimicrobials:  None  DVT prophylaxis: Lovenox   Objective: Vitals:   01/28/20 0617 01/28/20 0911  BP: 131/67 125/67  Pulse: 74 74  Resp: 17 18  Temp: 98 F (36.7 C) 97.9 F (36.6 C)  SpO2: 93% 96%    Intake/Output Summary (Last 24 hours) at 01/28/2020 1316 Last data filed at 01/28/2020 1601 Gross  per 24 hour  Intake 1895 ml  Output 150 ml  Net 1745 ml   Filed Weights   01/25/20 1507  Weight: 111.1 kg   Body mass index is 44.81 kg/m.  Exam:   General: Alert and oriented x3, no acute distress  HEENT: Normocephalic and atraumatic, mucous membranes moist  Cardiovascular: Regular rate and rhythm, S1-S2  Respiratory: Clear auscultation bilaterally  Abdomen: Soft, nontender, nondistended, positive bowel sounds  Musculoskeletal: No clubbing or cyanosis or edema  Skin: No skin breaks, tears or lesions  Psychiatry: Appropriate, no evidence of psychoses   Data Reviewed: CBC: Recent Labs  Lab 01/25/20 0335 01/26/20 0405 01/27/20 0151 01/28/20 0339  WBC 6.4 4.5 4.9 5.8  NEUTROABS 5.6  --   --   --   HGB 11.9* 11.1* 9.9* 10.0*  HCT 39.3 36.7 30.8* 30.5*  MCV 84.2 85.3 83.5 80.9  PLT 149* PLATELET CLUMPS NOTED ON SMEAR, UNABLE TO ESTIMATE PLATELET CLUMPS NOTED ON SMEAR, UNABLE TO ESTIMATE 093*   Basic Metabolic Panel: Recent Labs  Lab 01/25/20 0335 01/26/20 0405 01/27/20 0151 01/28/20 0339  NA 134* 134* 137 137  K 3.1* 3.5 2.8* 3.6  CL 95* 101 106 105  CO2 24 21* 21* 22  GLUCOSE 175* 119* 128* 109*  BUN 23 15 12 10   CREATININE 0.98 0.77 0.73 0.67  CALCIUM 8.1* 7.7* 7.5* 7.6*   GFR: Estimated Creatinine Clearance: 81.4 mL/min (by C-G formula based on SCr of 0.67 mg/dL). Liver Function Tests: Recent Labs  Lab 01/26/20 0405  AST 51*  ALT 33  ALKPHOS 140*  BILITOT 1.0  PROT 5.2*  ALBUMIN 2.2*   No results for input(s): LIPASE, AMYLASE in the last 168 hours. No results for input(s): AMMONIA in the last 168 hours. Coagulation Profile: No results for input(s): INR, PROTIME in the last 168 hours. Cardiac Enzymes: Recent Labs  Lab 01/25/20 0335 01/26/20 0405 01/27/20 0151  CKTOTAL 3,004* 964* 559*   BNP (last 3 results) No results for input(s): PROBNP in the last 8760 hours. HbA1C: No results for input(s): HGBA1C in the last 72  hours. CBG: No results for input(s): GLUCAP in the last 168 hours. Lipid Profile: No results for input(s): CHOL, HDL, LDLCALC, TRIG, CHOLHDL, LDLDIRECT in the last 72 hours. Thyroid Function Tests: Recent Labs    01/25/20 1642  TSH 2.906   Anemia Panel: No results for input(s): VITAMINB12, FOLATE, FERRITIN, TIBC, IRON, RETICCTPCT in the last 72 hours. Urine analysis:    Component Value Date/Time   COLORURINE YELLOW 12/13/2016 1102   APPEARANCEUR CLEAR 12/13/2016 1102   LABSPEC 1.018 12/13/2016 1102   PHURINE 5.0 12/13/2016 1102   GLUCOSEU NEGATIVE 12/13/2016 1102   HGBUR NEGATIVE 12/13/2016 1102   BILIRUBINUR NEGATIVE 12/13/2016 1102   BILIRUBINUR neg 03/07/2014 1459   KETONESUR NEGATIVE 12/13/2016 1102   PROTEINUR 30 (A) 12/13/2016 1102   UROBILINOGEN negative 03/07/2014 1459   UROBILINOGEN 0.2 03/04/2013 1710   NITRITE NEGATIVE 12/13/2016 1102   LEUKOCYTESUR TRACE (A) 12/13/2016 1102  Sepsis Labs: @LABRCNTIP (procalcitonin:4,lacticidven:4)  ) Recent Results (from the past 240 hour(s))  Resp Panel by RT-PCR (Flu A&B, Covid) Nasopharyngeal Swab     Status: None   Collection Time: 01/25/20  6:12 AM   Specimen: Nasopharyngeal Swab; Nasopharyngeal(NP) swabs in vial transport medium  Result Value Ref Range Status   SARS Coronavirus 2 by RT PCR NEGATIVE NEGATIVE Final    Comment: (NOTE) SARS-CoV-2 target nucleic acids are NOT DETECTED.  The SARS-CoV-2 RNA is generally detectable in upper respiratory specimens during the acute phase of infection. The lowest concentration of SARS-CoV-2 viral copies this assay can detect is 138 copies/mL. A negative result does not preclude SARS-Cov-2 infection and should not be used as the sole basis for treatment or other patient management decisions. A negative result may occur with  improper specimen collection/handling, submission of specimen other than nasopharyngeal swab, presence of viral mutation(s) within the areas targeted by  this assay, and inadequate number of viral copies(<138 copies/mL). A negative result must be combined with clinical observations, patient history, and epidemiological information. The expected result is Negative.  Fact Sheet for Patients:  EntrepreneurPulse.com.au  Fact Sheet for Healthcare Providers:  IncredibleEmployment.be  This test is no t yet approved or cleared by the [place] FDA and  has been authorized for detection and/or diagnosis of SARS-CoV-2 by FDA under an Emergency Use Authorization (EUA). This EUA will remain  in effect (meaning this test can be used) for the duration of the COVID-19 declaration under Section 564(b)(1) of the Act, 21 U.S.C.section 360bbb-3(b)(1), unless the authorization is terminated  or revoked sooner.       Influenza A by PCR NEGATIVE NEGATIVE Final   Influenza B by PCR NEGATIVE NEGATIVE Final    Comment: (NOTE) The Xpert Xpress SARS-CoV-2/FLU/RSV plus assay is intended as an aid in the diagnosis of influenza from Nasopharyngeal swab specimens and should not be used as a sole basis for treatment. Nasal washings and aspirates are unacceptable for Xpert Xpress SARS-CoV-2/FLU/RSV testing.  Fact Sheet for Patients: EntrepreneurPulse.com.au  Fact Sheet for Healthcare Providers: IncredibleEmployment.be  This test is not yet approved or cleared by the [place] FDA and has been authorized for detection and/or diagnosis of SARS-CoV-2 by FDA under an Emergency Use Authorization (EUA). This EUA will remain in effect (meaning this test can be used) for the duration of the COVID-19 declaration under Section 564(b)(1) of the Act, 21 U.S.C. section 360bbb-3(b)(1), unless the authorization is terminated or revoked.  Performed at [facility], [place] [address], [place], [place] [address]       Studies: No results found.  Scheduled Meds: . aspirin EC  81 mg  Oral Daily  . busPIRone  20 mg Oral BID  . enoxaparin (LOVENOX) injection  40 mg Subcutaneous Q24H  . FLUoxetine  40 mg Oral Daily  . gabapentin  300 mg Oral QHS  . hydrOXYzine  50 mg Oral Daily   And  . hydrOXYzine  100 mg Oral QHS  . lamoTRIgine  100 mg Oral Daily  . multivitamin with minerals  1 tablet Oral Daily  . Ensure Max Protein  11 oz Oral BID  . QUEtiapine  100 mg Oral QHS  . sodium chloride flush  3 mL Intravenous Q12H    Continuous Infusions: . sodium chloride 75 mL/hr at 01/28/20 0224     LOS: 3 days     Annita Brod, MD [facility]   01/28/2020, 1:16 PM

## 2020-01-29 DIAGNOSIS — I10 Essential (primary) hypertension: Secondary | ICD-10-CM | POA: Diagnosis not present

## 2020-01-29 DIAGNOSIS — R531 Weakness: Secondary | ICD-10-CM | POA: Diagnosis not present

## 2020-01-29 DIAGNOSIS — F0630 Mood disorder due to known physiological condition, unspecified: Secondary | ICD-10-CM | POA: Diagnosis not present

## 2020-01-29 DIAGNOSIS — M6282 Rhabdomyolysis: Secondary | ICD-10-CM | POA: Diagnosis not present

## 2020-01-29 NOTE — Progress Notes (Signed)
PROGRESS NOTE  Tamara Mccullough IHK:[id number] DOB: 11-Feb-1955 DOA: 01/25/2020 PCP: Heywood Bene, PA-C  HPI/Recap of past 44 hours: 64 year old female with past medical history significant for hypertension, hypothyroidism, COVID-19 infection this past April and chronic pain who underwent a L5/S1 interbody fusion for foraminal stenosis with radiculopathy on August 31.  Following the procedure, patient states that she went home and was barely able to walk.  While pain had much improved, she was having right-sided numbness from the knee down to her toes as well as feelings of pins-and-needles in her right foot.  Patient had had physical therapy coming to her house.  She also underwent shock therapy for her mood disorder 5 days prior.  The day after she was fine, but then she says 3 days ago she was unable to get up.  She tried to get out of bed and slid to the floor where she lived for 15 hours until paramedics were called.  They were able to get her back to bed, and she did not want to come into the hospital.  The following day, she was try to get up to go the bathroom again felt but was able to call family to call 911 and this time she came to the hospital which was 12/21.  Patient was evaluated and found to have a CK level of 3004.  MRI of the lumbar spine noted recent fusion with regressing noncompressive fluid collection marrow edema and some mild L4-5 right foraminal impingement.  Patient was admitted to the hospital service for rhabdomyolysis.  Patient was evaluated by neurosurgery and after review of MRI, felt that patient's main issues were from deconditioning and weakness as she has had virtually no activity in the last few days.  PT and OT recommending CIR. after discussion with inpatient rehab physician, patient opted for SNF.  Social work consulted, and patient has elected facility.  Patient doing okay.  Has been tolerating getting out of bed to chair.  Assessment/Plan: Principal  Problem:   Generalized weakness: We will need much physical therapy looking at skilled nursing rehab beds, hopefully will go Monday Active Problems:   Essential hypertension, benign: Continue to remain stable.  Continue home medications.    Mood disorder in conditions classified elsewhere: Status post shock therapy.  Continue home medications.  Morbid obesity: Patient with poor appetite and weight loss.  Reported that she has lost over 40 pounds since April because of Covid.  Meets criteria for morbid obesity given BMI greater than 40.    Moderate protein calorie malnutrition: In the context of chronic illness which includes her alcoholism as evidenced by mild to moderate fat and muscle depletion.  Seen by nutrition.  Put on Ensure twice daily plus multivitamin.    Rhabdomyolysis: Now resolved.  Secondary to prolonged immobilization on floor prior to admission.  CPK continues to trend downward.    Lumbar nerve root impingement: No significant impingement.  Cleared by neurosurgery.    Bradycardia: Heart rate on admission noted to be 40s to 50s.  Much improved, now 60s to 70s    Hypokalemia: Replace as needed.    Anxiety: Stable  Alcohol abuse: Updated daughter by phone who informed us that patient has issues with alcohol abuse.  She was glad that the patient is in the hospital and from here is going to skilled facility which will help keep her away from alcohol.  Patient is out of window for withdrawals   Code Status: Full code  Family Communication:  Left message for daughter.  Disposition Plan: Skilled nursing, hopefully Monday   Consultants:  Neurosurgery  Physical medicine rehab  Procedures:  None  Antimicrobials:  None  DVT prophylaxis: Lovenox   Objective: Vitals:   01/29/20 0549 01/29/20 1203  BP: 133/62 122/61  Pulse: 77 79  Resp: 18 20  Temp: 98.2 F (36.8 C) 98.4 F (36.9 C)  SpO2: 92% 92%    Intake/Output Summary (Last 24 hours) at 01/29/2020  1342 Last data filed at 01/29/2020 1139 Gross per 24 hour  Intake 963 ml  Output 1225 ml  Net -262 ml   Filed Weights   01/25/20 1507 01/28/20 2255  Weight: 111.1 kg 102.2 kg   Body mass index is 41.21 kg/m.  Exam: Unchanged from previous day  General: Alert and oriented x3, no acute distress  HEENT: Normocephalic and atraumatic, mucous membranes moist  Cardiovascular: Regular rate and rhythm, S1-S2  Respiratory: Clear auscultation bilaterally  Abdomen: Soft, nontender, nondistended, positive bowel sounds  Musculoskeletal: No clubbing or cyanosis or edema  Skin: No skin breaks, tears or lesions  Psychiatry: Appropriate, no evidence of psychoses   Data Reviewed: CBC: Recent Labs  Lab 01/25/20 0335 01/26/20 0405 01/27/20 0151 01/28/20 0339  WBC 6.4 4.5 4.9 5.8  NEUTROABS 5.6  --   --   --   HGB 11.9* 11.1* 9.9* 10.0*  HCT 39.3 36.7 30.8* 30.5*  MCV 84.2 85.3 83.5 80.9  PLT 149* PLATELET CLUMPS NOTED ON SMEAR, UNABLE TO ESTIMATE PLATELET CLUMPS NOTED ON SMEAR, UNABLE TO ESTIMATE [id number]*   Basic Metabolic Panel: Recent Labs  Lab 01/25/20 0335 01/26/20 0405 01/27/20 0151 01/28/20 0339  NA 134* 134* 137 137  K 3.1* 3.5 2.8* 3.6  CL 95* 101 106 105  CO2 24 21* 21* 22  GLUCOSE 175* 119* 128* 109*  BUN 23 15 12 10   CREATININE 0.98 0.77 0.73 0.67  CALCIUM 8.1* 7.7* 7.5* 7.6*   GFR: Estimated Creatinine Clearance: 77.4 mL/min (by C-G formula based on SCr of 0.67 mg/dL). Liver Function Tests: Recent Labs  Lab 01/26/20 0405  AST 51*  ALT 33  ALKPHOS 140*  BILITOT 1.0  PROT 5.2*  ALBUMIN 2.2*   No results for input(s): LIPASE, AMYLASE in the last 168 hours. No results for input(s): AMMONIA in the last 168 hours. Coagulation Profile: No results for input(s): INR, PROTIME in the last 168 hours. Cardiac Enzymes: Recent Labs  Lab 01/25/20 0335 01/26/20 0405 01/27/20 0151  CKTOTAL 3,004* 964* 559*   BNP (last 3 results) No results for input(s):  PROBNP in the last 8760 hours. HbA1C: No results for input(s): HGBA1C in the last 72 hours. CBG: No results for input(s): GLUCAP in the last 168 hours. Lipid Profile: No results for input(s): CHOL, HDL, LDLCALC, TRIG, CHOLHDL, LDLDIRECT in the last 72 hours. Thyroid Function Tests: No results for input(s): TSH, T4TOTAL, FREET4, T3FREE, THYROIDAB in the last 72 hours. Anemia Panel: No results for input(s): VITAMINB12, FOLATE, FERRITIN, TIBC, IRON, RETICCTPCT in the last 72 hours. Urine analysis:    Component Value Date/Time   COLORURINE YELLOW 12/13/2016 1102   APPEARANCEUR CLEAR 12/13/2016 1102   LABSPEC 1.018 12/13/2016 1102   PHURINE 5.0 12/13/2016 1102   GLUCOSEU NEGATIVE 12/13/2016 1102   HGBUR NEGATIVE 12/13/2016 1102   BILIRUBINUR NEGATIVE 12/13/2016 1102   BILIRUBINUR neg 03/07/2014 1459   KETONESUR NEGATIVE 12/13/2016 1102   PROTEINUR 30 (A) 12/13/2016 1102   UROBILINOGEN negative 03/07/2014 1459   UROBILINOGEN 0.2 03/04/2013 1710  NITRITE NEGATIVE 12/13/2016 1102   LEUKOCYTESUR TRACE (A) 12/13/2016 1102   Sepsis Labs: @LABRCNTIP (procalcitonin:4,lacticidven:4)  ) Recent Results (from the past 240 hour(s))  Resp Panel by RT-PCR (Flu A&B, Covid) Nasopharyngeal Swab     Status: None   Collection Time: 01/25/20  6:12 AM   Specimen: Nasopharyngeal Swab; Nasopharyngeal(NP) swabs in vial transport medium  Result Value Ref Range Status   SARS Coronavirus 2 by RT PCR NEGATIVE NEGATIVE Final    Comment: (NOTE) SARS-CoV-2 target nucleic acids are NOT DETECTED.  The SARS-CoV-2 RNA is generally detectable in upper respiratory specimens during the acute phase of infection. The lowest concentration of SARS-CoV-2 viral copies this assay can detect is 138 copies/mL. A negative result does not preclude SARS-Cov-2 infection and should not be used as the sole basis for treatment or other patient management decisions. A negative result may occur with  improper specimen  collection/handling, submission of specimen other than nasopharyngeal swab, presence of viral mutation(s) within the areas targeted by this assay, and inadequate number of viral copies(<138 copies/mL). A negative result must be combined with clinical observations, patient history, and epidemiological information. The expected result is Negative.  Fact Sheet for Patients:  EntrepreneurPulse.com.au  Fact Sheet for Healthcare Providers:  IncredibleEmployment.be  This test is no t yet approved or cleared by the [place] FDA and  has been authorized for detection and/or diagnosis of SARS-CoV-2 by FDA under an Emergency Use Authorization (EUA). This EUA will remain  in effect (meaning this test can be used) for the duration of the COVID-19 declaration under Section 564(b)(1) of the Act, 21 U.S.C.section 360bbb-3(b)(1), unless the authorization is terminated  or revoked sooner.       Influenza A by PCR NEGATIVE NEGATIVE Final   Influenza B by PCR NEGATIVE NEGATIVE Final    Comment: (NOTE) The Xpert Xpress SARS-CoV-2/FLU/RSV plus assay is intended as an aid in the diagnosis of influenza from Nasopharyngeal swab specimens and should not be used as a sole basis for treatment. Nasal washings and aspirates are unacceptable for Xpert Xpress SARS-CoV-2/FLU/RSV testing.  Fact Sheet for Patients: EntrepreneurPulse.com.au  Fact Sheet for Healthcare Providers: IncredibleEmployment.be  This test is not yet approved or cleared by the [place] FDA and has been authorized for detection and/or diagnosis of SARS-CoV-2 by FDA under an Emergency Use Authorization (EUA). This EUA will remain in effect (meaning this test can be used) for the duration of the COVID-19 declaration under Section 564(b)(1) of the Act, 21 U.S.C. section 360bbb-3(b)(1), unless the authorization is terminated or revoked.  Performed at [facility], [place] [address] [place], [place] [address]       Studies: No results found.  Scheduled Meds: . aspirin EC  81 mg Oral Daily  . busPIRone  20 mg Oral BID  . enoxaparin (LOVENOX) injection  40 mg Subcutaneous Q24H  . FLUoxetine  40 mg Oral Daily  . gabapentin  300 mg Oral QHS  . hydrOXYzine  50 mg Oral Daily   And  . hydrOXYzine  100 mg Oral QHS  . lamoTRIgine  100 mg Oral Daily  . multivitamin with minerals  1 tablet Oral Daily  . Ensure Max Protein  11 oz Oral BID  . QUEtiapine  100 mg Oral QHS  . sodium chloride flush  3 mL Intravenous Q12H    Continuous Infusions:    LOS: 4 days     Annita Brod, MD [facility]   01/29/2020, 1:42 PM

## 2020-01-29 NOTE — Plan of Care (Signed)
  Problem: Education: Goal: Knowledge of General Education information will improve Description: Including pain rating scale, medication(s)/side effects and non-pharmacologic comfort measures Outcome: Progressing   Problem: Health Behavior/Discharge Planning: Goal: Ability to manage health-related needs will improve Outcome: Progressing   Problem: Elimination: Goal: Will not experience complications related to bowel motility Outcome: Progressing   Problem: Pain Managment: Goal: General experience of comfort will improve Outcome: Progressing

## 2020-01-30 DIAGNOSIS — F0630 Mood disorder due to known physiological condition, unspecified: Secondary | ICD-10-CM | POA: Diagnosis not present

## 2020-01-30 DIAGNOSIS — M6282 Rhabdomyolysis: Secondary | ICD-10-CM | POA: Diagnosis not present

## 2020-01-30 DIAGNOSIS — R531 Weakness: Secondary | ICD-10-CM | POA: Diagnosis not present

## 2020-01-30 DIAGNOSIS — I10 Essential (primary) hypertension: Secondary | ICD-10-CM | POA: Diagnosis not present

## 2020-01-30 NOTE — Progress Notes (Signed)
PROGRESS NOTE  TIPHANIE CONNERY [id number] DOB: 1955/03/04 DOA: 01/25/2020 PCP: Heywood Bene, PA-C  HPI/Recap of past 48 hours: 64 year old female with past medical history significant for hypertension, hypothyroidism, COVID-19 infection this past April and chronic pain who underwent a L5/S1 interbody fusion for foraminal stenosis with radiculopathy on August 31.  Following the procedure, patient states that she went home and was barely able to walk.  While pain had much improved, she was having right-sided numbness from the knee down to her toes as well as feelings of pins-and-needles in her right foot.  Patient had had physical therapy coming to her house.  She also underwent shock therapy for her mood disorder 5 days prior.  The day after she was fine, but then she says 3 days ago she was unable to get up.  She tried to get out of bed and slid to the floor where she lived for 15 hours until paramedics were called.  They were able to get her back to bed, and she did not want to come into the hospital.  The following day, she was try to get up to go the bathroom again felt but was able to call family to call 911 and this time she came to the hospital which was 12/21.  Patient was evaluated and found to have a CK level of 3004.  MRI of the lumbar spine noted recent fusion with regressing noncompressive fluid collection marrow edema and some mild L4-5 right foraminal impingement.  Patient was admitted to the hospital service for rhabdomyolysis.  Patient was evaluated by neurosurgery and after review of MRI, felt that patient's main issues were from deconditioning and weakness as she has had virtually no activity in the last few days.  PT and OT recommending CIR. after discussion with inpatient rehab physician, patient opted for SNF.  Social work consulted, and patient has elected facility.  Patient doing okay.  Has been tolerating getting out of bed to chair.  Occasional back pain, no new  complaints  Assessment/Plan: Principal Problem:   Generalized weakness: We will need much physical therapy looking at skilled nursing rehab beds, hopefully will go Monday Active Problems:   Essential hypertension, benign: Continue to remain stable.  Continue home medications.    Mood disorder in conditions classified elsewhere: Status post shock therapy.  Continue home medications.  Morbid obesity: Patient with poor appetite and weight loss.  Reported that she has lost over 40 pounds since April because of Covid.  Meets criteria for morbid obesity given BMI greater than 40.    Moderate protein calorie malnutrition: In the context of chronic illness which includes her alcoholism as evidenced by mild to moderate fat and muscle depletion.  Seen by nutrition.  Put on Ensure twice daily plus multivitamin.    Rhabdomyolysis: Now resolved.  Secondary to prolonged immobilization on floor prior to admission.  CPK continues to trend downward.    Lumbar nerve root impingement: No significant impingement.  Cleared by neurosurgery.    Bradycardia: Heart rate on admission noted to be 40s to 50s.  Much improved, now 60s to 70s    Hypokalemia: Replace as needed.  Recheck labs in the morning    Anxiety: Stable  Alcohol abuse: Updated daughter by phone who informed us that patient has issues with alcohol abuse.  She was glad that the patient is in the hospital and from here is going to skilled facility which will help keep her away from alcohol.  Patient is out  of window for withdrawals   Code Status: Full code  Family Communication: Left message for daughter.  Disposition Plan: Skilled nursing, hopefully Monday   Consultants:  Neurosurgery  Physical medicine rehab  Procedures:  None  Antimicrobials:  None  DVT prophylaxis: Lovenox   Objective: Vitals:   01/30/20 0455 01/30/20 0847  BP: 125/67 119/61  Pulse: 72 72  Resp: 18 18  Temp: 99 F (37.2 C) 97.9 F (36.6 C)  SpO2:  94% 93%    Intake/Output Summary (Last 24 hours) at 01/30/2020 1208 Last data filed at 01/30/2020 1004 Gross per 24 hour  Intake 243 ml  Output 600 ml  Net -357 ml   Filed Weights   01/25/20 1507 01/28/20 2255  Weight: 111.1 kg 102.2 kg   Body mass index is 41.21 kg/m.  Exam:   General: Alert and oriented x3, no acute distress  HEENT: Normocephalic and atraumatic, mucous membranes moist  Cardiovascular: Regular rate and rhythm, S1-S2  Respiratory: Clear auscultation bilaterally  Abdomen: Soft, nontender, nondistended, positive bowel sounds  Musculoskeletal: No clubbing or cyanosis or edema  Skin: No skin breaks, tears or lesions  Psychiatry: Appropriate, no evidence of psychoses   Data Reviewed: CBC: Recent Labs  Lab 01/25/20 0335 01/26/20 0405 01/27/20 0151 01/28/20 0339  WBC 6.4 4.5 4.9 5.8  NEUTROABS 5.6  --   --   --   HGB 11.9* 11.1* 9.9* 10.0*  HCT 39.3 36.7 30.8* 30.5*  MCV 84.2 85.3 83.5 80.9  PLT 149* PLATELET CLUMPS NOTED ON SMEAR, UNABLE TO ESTIMATE PLATELET CLUMPS NOTED ON SMEAR, UNABLE TO ESTIMATE 123XX123*   Basic Metabolic Panel: Recent Labs  Lab 01/25/20 0335 01/26/20 0405 01/27/20 0151 01/28/20 0339  NA 134* 134* 137 137  K 3.1* 3.5 2.8* 3.6  CL 95* 101 106 105  CO2 24 21* 21* 22  GLUCOSE 175* 119* 128* 109*  BUN 23 15 12 10   CREATININE 0.98 0.77 0.73 0.67  CALCIUM 8.1* 7.7* 7.5* 7.6*   GFR: Estimated Creatinine Clearance: 77.4 mL/min (by C-G formula based on SCr of 0.67 mg/dL). Liver Function Tests: Recent Labs  Lab 01/26/20 0405  AST 51*  ALT 33  ALKPHOS 140*  BILITOT 1.0  PROT 5.2*  ALBUMIN 2.2*   No results for input(s): LIPASE, AMYLASE in the last 168 hours. No results for input(s): AMMONIA in the last 168 hours. Coagulation Profile: No results for input(s): INR, PROTIME in the last 168 hours. Cardiac Enzymes: Recent Labs  Lab 01/25/20 0335 01/26/20 0405 01/27/20 0151  CKTOTAL 3,004* 964* 559*   BNP (last 3  results) No results for input(s): PROBNP in the last 8760 hours. HbA1C: No results for input(s): HGBA1C in the last 72 hours. CBG: No results for input(s): GLUCAP in the last 168 hours. Lipid Profile: No results for input(s): CHOL, HDL, LDLCALC, TRIG, CHOLHDL, LDLDIRECT in the last 72 hours. Thyroid Function Tests: No results for input(s): TSH, T4TOTAL, FREET4, T3FREE, THYROIDAB in the last 72 hours. Anemia Panel: No results for input(s): VITAMINB12, FOLATE, FERRITIN, TIBC, IRON, RETICCTPCT in the last 72 hours. Urine analysis:    Component Value Date/Time   COLORURINE YELLOW 12/13/2016 1102   APPEARANCEUR CLEAR 12/13/2016 1102   LABSPEC 1.018 12/13/2016 1102   PHURINE 5.0 12/13/2016 1102   GLUCOSEU NEGATIVE 12/13/2016 1102   HGBUR NEGATIVE 12/13/2016 1102   BILIRUBINUR NEGATIVE 12/13/2016 1102   BILIRUBINUR neg 03/07/2014 [place] 12/13/2016 1102   PROTEINUR 30 (A) 12/13/2016 1102   UROBILINOGEN negative  03/07/2014 1459   UROBILINOGEN 0.2 03/04/2013 1710   NITRITE NEGATIVE 12/13/2016 1102   LEUKOCYTESUR TRACE (A) 12/13/2016 1102   Sepsis Labs: @LABRCNTIP (procalcitonin:4,lacticidven:4)  ) Recent Results (from the past 240 hour(s))  Resp Panel by RT-PCR (Flu A&B, Covid) Nasopharyngeal Swab     Status: None   Collection Time: 01/25/20  6:12 AM   Specimen: Nasopharyngeal Swab; Nasopharyngeal(NP) swabs in vial transport medium  Result Value Ref Range Status   SARS Coronavirus 2 by RT PCR NEGATIVE NEGATIVE Final    Comment: (NOTE) SARS-CoV-2 target nucleic acids are NOT DETECTED.  The SARS-CoV-2 RNA is generally detectable in upper respiratory specimens during the acute phase of infection. The lowest concentration of SARS-CoV-2 viral copies this assay can detect is 138 copies/mL. A negative result does not preclude SARS-Cov-2 infection and should not be used as the sole basis for treatment or other patient management decisions. A negative result may occur  with  improper specimen collection/handling, submission of specimen other than nasopharyngeal swab, presence of viral mutation(s) within the areas targeted by this assay, and inadequate number of viral copies(<138 copies/mL). A negative result must be combined with clinical observations, patient history, and epidemiological information. The expected result is Negative.  Fact Sheet for Patients:  EntrepreneurPulse.com.au  Fact Sheet for Healthcare Providers:  IncredibleEmployment.be  This test is no t yet approved or cleared by the [place] FDA and  has been authorized for detection and/or diagnosis of SARS-CoV-2 by FDA under an Emergency Use Authorization (EUA). This EUA will remain  in effect (meaning this test can be used) for the duration of the COVID-19 declaration under Section 564(b)(1) of the Act, 21 U.S.C.section 360bbb-3(b)(1), unless the authorization is terminated  or revoked sooner.       Influenza A by PCR NEGATIVE NEGATIVE Final   Influenza B by PCR NEGATIVE NEGATIVE Final    Comment: (NOTE) The Xpert Xpress SARS-CoV-2/FLU/RSV plus assay is intended as an aid in the diagnosis of influenza from Nasopharyngeal swab specimens and should not be used as a sole basis for treatment. Nasal washings and aspirates are unacceptable for Xpert Xpress SARS-CoV-2/FLU/RSV testing.  Fact Sheet for Patients: EntrepreneurPulse.com.au  Fact Sheet for Healthcare Providers: IncredibleEmployment.be  This test is not yet approved or cleared by the [place] FDA and has been authorized for detection and/or diagnosis of SARS-CoV-2 by FDA under an Emergency Use Authorization (EUA). This EUA will remain in effect (meaning this test can be used) for the duration of the COVID-19 declaration under Section 564(b)(1) of the Act, 21 U.S.C. section 360bbb-3(b)(1), unless the authorization is terminated  or revoked.  Performed at [facility], [place] [address] [place], [place] [address]       Studies: No results found.  Scheduled Meds: . aspirin EC  81 mg Oral Daily  . busPIRone  20 mg Oral BID  . enoxaparin (LOVENOX) injection  40 mg Subcutaneous Q24H  . FLUoxetine  40 mg Oral Daily  . gabapentin  300 mg Oral QHS  . hydrOXYzine  50 mg Oral Daily   And  . hydrOXYzine  100 mg Oral QHS  . lamoTRIgine  100 mg Oral Daily  . multivitamin with minerals  1 tablet Oral Daily  . Ensure Max Protein  11 oz Oral BID  . QUEtiapine  100 mg Oral QHS  . sodium chloride flush  3 mL Intravenous Q12H    Continuous Infusions:    LOS: 5 days     Annita Brod, MD  [facility]   01/30/2020, 12:08 PM

## 2020-01-30 NOTE — TOC Progression Note (Signed)
Transition of Care [facility]) - Progression Note    Patient Details  Name: Tamara Mccullough MRN: [id number] Date of Birth: 1955-08-29  Transition of Care [facility]) CM/SW Phil Campbell, [place] Phone Number: 01/30/2020, 1:25 PM  Clinical Narrative:    CSW gave bed offers to pt. She noted that she would like to go to [facility]. She was advised that [facility] had not offered and was a buy in living community. She was upset with her bed choices, but was encouraged to look at them and decide. CSW asked if there was anyone that the pt wanted called and she said no. PASSR still at level 2 manual review. SW will continue to follow for DC planning.    Expected Discharge Plan: [place] Barriers to Discharge: Continued Medical Work up,SNF Pending bed offer  Expected Discharge Plan and Services Expected Discharge Plan: [place] Choice: [place] arrangements for the past 2 months: Single Family Home                                       Social Determinants of Health (SDOH) Interventions    Readmission Risk Interventions No flowsheet data found.

## 2020-01-30 NOTE — Plan of Care (Signed)

## 2020-01-31 DIAGNOSIS — R001 Bradycardia, unspecified: Secondary | ICD-10-CM | POA: Diagnosis not present

## 2020-01-31 DIAGNOSIS — I10 Essential (primary) hypertension: Secondary | ICD-10-CM | POA: Diagnosis not present

## 2020-01-31 DIAGNOSIS — R531 Weakness: Secondary | ICD-10-CM | POA: Diagnosis not present

## 2020-01-31 DIAGNOSIS — E876 Hypokalemia: Secondary | ICD-10-CM | POA: Diagnosis not present

## 2020-01-31 LAB — URINALYSIS, ROUTINE W REFLEX MICROSCOPIC
Bilirubin Urine: NEGATIVE
Glucose, UA: NEGATIVE mg/dL
Hgb urine dipstick: NEGATIVE
Ketones, ur: 20 mg/dL — AB
Leukocytes,Ua: NEGATIVE
Nitrite: NEGATIVE
Protein, ur: NEGATIVE mg/dL
Specific Gravity, Urine: 1.009 (ref 1.005–1.030)
pH: 7 (ref 5.0–8.0)

## 2020-01-31 MED ORDER — ALPRAZOLAM 0.25 MG PO TABS
0.2500 mg | ORAL_TABLET | Freq: Two times a day (BID) | ORAL | Status: DC | PRN
  Administered 2020-01-31 – 2020-02-07 (×14): 0.25 mg via ORAL
  Filled 2020-01-31 (×14): qty 1

## 2020-01-31 MED ORDER — CYCLOBENZAPRINE HCL 5 MG PO TABS
5.0000 mg | ORAL_TABLET | Freq: Three times a day (TID) | ORAL | Status: DC | PRN
  Administered 2020-01-31 – 2020-02-07 (×13): 5 mg via ORAL
  Filled 2020-01-31 (×15): qty 1

## 2020-01-31 NOTE — TOC Progression Note (Signed)
Transition of Care [facility]) - Progression Note    Patient Details  Name: Tamara Mccullough MRN: [id number] Date of Birth: March 16, 1955  [facility]) CM/SW Contact  Okey Dupre Lazaro Arms, LCSW Phone Number: 01/31/2020, 4:03 PM  Clinical Narrative:  Visited with [name] to talk with her regarding the facility responses and determine if she had made a decision. Bed offers given again and patient chose [facility].  1:07 pm: Talked with admissions director Margret Chance regarding patient and was asked to e-mail information ([email or web address])  3:47 pm: Talked with Mr. Durwin Nora regarding his Epic access. He will check on and follow-up with CSW. .       Expected Discharge Plan: Skilled Nursing Facility Barriers to Discharge: Continued Medical Work up,SNF Pending bed offer  Expected Discharge Plan and Services Expected Discharge Plan: Skilled Nursing Facility     Post Acute Care Choice: Skilled Nursing Facility Living arrangements for the past 2 months: Single Family Home                                       Social Determinants of Health (SDOH) Interventions    Readmission Risk Interventions No flowsheet data found.

## 2020-01-31 NOTE — Progress Notes (Signed)
Physical Therapy Treatment Patient Details Name: Tamara Mccullough MRN: [id number] DOB: 05/25/1955 Today's Date: 01/31/2020    History of Present Illness Pt is a 64yo female s/p fall, lying on floor x15 hours. Found to have rhabdo, R sided weakness. MRI lumbar: L4-5 right foraminal impingementPMHx: L5-S1 sx 09/2019. ACDF 11/2013.    PT Comments    Patient received in bed, pleasant and cooperative with therapy and determined to "do this by myself". Able to perform mobility today with min guard assist and extended time/increased effort as well as intermittent cues for safety/hand placement. Tolerated significant progression of gait distance well, much less tremulous. Still having STM deficits and confusing SNF/CIR, perseverating on wanting to go to CIR today. Left up in recliner with all needs met, chair alarm active. Progressing well.    Follow Up Recommendations  SNF     Equipment Recommendations  Rolling walker with 5" wheels;3in1 (PT);Wheelchair (measurements PT);Wheelchair cushion (measurements PT)    Recommendations for Other Services       Precautions / Restrictions Precautions Precautions: Fall;Other (comment) Precaution Comments: multiple falls in past 6 mos Restrictions Weight Bearing Restrictions: No    Mobility  Bed Mobility Overal bed mobility: Needs Assistance Bed Mobility: Supine to Sit     Supine to sit: Min guard;HOB elevated     General bed mobility comments: min guard and significant amount of increased time to get to EOB but able to do so without external assistance  Transfers Overall transfer level: Needs assistance Equipment used: Rolling walker (2 wheeled) Transfers: Sit to/from Stand Sit to Stand: Min guard         General transfer comment: min guard to boost up, did need intermittent cues for hand placement especially when going from stand to sit  Ambulation/Gait Ambulation/Gait assistance: Min guard Gait Distance (Feet): 150  Feet Assistive device: Rolling walker (2 wheeled) Gait Pattern/deviations: Step-through pattern;Decreased step length - right;Decreased step length - left;Decreased stride length;Decreased weight shift to right;Decreased weight shift to left;Trunk flexed;Wide base of support Gait velocity: decreased   General Gait Details: slow but steady with RW, no significant balance deficit noted, states "I feel stronger"   Stairs             Wheelchair Mobility    Modified Rankin (Stroke Patients Only)       Balance Overall balance assessment: Needs assistance;History of Falls Sitting-balance support: Bilateral upper extremity supported;Feet supported Sitting balance-Leahy Scale: Good     Standing balance support: Bilateral upper extremity supported;During functional activity Standing balance-Leahy Scale: Fair Standing balance comment: hx of multiple recent falls, heavy reliance on BUE support                            Cognition Arousal/Alertness: Awake/alert Behavior During Therapy: Flat affect Overall Cognitive Status: Impaired/Different from baseline Area of Impairment: Safety/judgement;Problem solving;Awareness                     Memory: Decreased short-term memory   Safety/Judgement: Decreased awareness of safety;Decreased awareness of deficits Awareness: Emergent Problem Solving: Slow processing;Decreased initiation;Difficulty sequencing;Requires verbal cues General Comments: cognition better today but will shows STM deficits and perseverating on going to CIR (even though it was her choice to go to SNF instead); very motivated to improve      Exercises      General Comments        Pertinent Vitals/Pain Pain Assessment: No/denies pain Pain Score: 0-No pain Pain  Intervention(s): Limited activity within patient's tolerance;Monitored during session    Home Living                      Prior Function            PT Goals (current  goals can now be found in the care plan section) Acute Rehab PT Goals Patient Stated Goal: go to SNF PT Goal Formulation: With patient Time For Goal Achievement: 02/09/20 Potential to Achieve Goals: Fair Progress towards PT goals: Progressing toward goals    Frequency    Min 3X/week      PT Plan Current plan remains appropriate    Co-evaluation              AM-PAC PT "6 Clicks" Mobility   Outcome Measure  Help needed turning from your back to your side while in a flat bed without using bedrails?: A Little Help needed moving from lying on your back to sitting on the side of a flat bed without using bedrails?: A Lot Help needed moving to and from a bed to a chair (including a wheelchair)?: A Little Help needed standing up from a chair using your arms (e.g., wheelchair or bedside chair)?: A Little Help needed to walk in hospital room?: A Little Help needed climbing 3-5 steps with a railing? : A Little 6 Click Score: 17    End of Session Equipment Utilized During Treatment: Gait belt Activity Tolerance: Patient tolerated treatment well Patient left: in chair;with call bell/phone within reach;with chair alarm set Nurse Communication: Mobility status PT Visit Diagnosis: Unsteadiness on feet (R26.81);Difficulty in walking, not elsewhere classified (R26.2);Repeated falls (R29.6);Muscle weakness (generalized) (M62.81)     Time: 5176-1607 PT Time Calculation (min) (ACUTE ONLY): 17 min  Charges:  $Gait Training: 8-22 mins                     Windell Norfolk, DPT, PN1   Supplemental Physical Therapist [place]    Pager [phone] Acute Rehab Office [phone]

## 2020-01-31 NOTE — Progress Notes (Signed)
PROGRESS NOTE  Tamara Mccullough [id number] DOB: 08/29/1955 DOA: 01/25/2020 PCP: Heywood Bene, PA-C  HPI/Recap of past 67 hours: 64 year old female with past medical history significant for hypertension, hypothyroidism, COVID-19 infection this past April and chronic pain who underwent a L5/S1 interbody fusion for foraminal stenosis with radiculopathy on August 31.  Following the procedure, patient states that she went home and was barely able to walk.  While pain had much improved, she was having right-sided numbness from the knee down to her toes as well as feelings of pins-and-needles in her right foot.  Patient had had physical therapy coming to her house.  She also underwent shock therapy for her mood disorder 5 days prior.  The day after she was fine, but then she says 3 days ago she was unable to get up.  She tried to get out of bed and slid to the floor where she lived for 15 hours until paramedics were called.  They were able to get her back to bed, and she did not want to come into the hospital.  The following day, she was try to get up to go the bathroom again felt but was able to call family to call 911 and this time she came to the hospital which was 12/21.  Patient was evaluated and found to have a CK level of 3004.  MRI of the lumbar spine noted recent fusion with regressing noncompressive fluid collection marrow edema and some mild L4-5 right foraminal impingement.  Patient was admitted to the hospital service for rhabdomyolysis.  Patient was evaluated by neurosurgery and after review of MRI, felt that patient's main issues were from deconditioning and weakness as she has had virtually no activity in the last few days.  PT and OT recommending CIR. after discussion with inpatient rehab physician, patient opted for SNF.  PASARR & insurance authorization pending.  Patient doing okay, complains of occasional back spasms.  Also complains of some dysuria and urinary  frequency.  Assessment/Plan: Principal Problem:   Generalized weakness: Needs a lot of physical therapy.  Have added muscle relaxer for back.  Skilled nursing bed once [employer] authorization goes through. Active Problems:   Essential hypertension, benign: Continue to remain stable.  Continue home medications.    Mood disorder in conditions classified elsewhere: Status post shock therapy.  Continue home medications.  Morbid obesity: Patient with poor appetite and weight loss.  Reported that she has lost over 40 pounds since April because of Covid.  Meets criteria for morbid obesity given BMI greater than 40.    Moderate protein calorie malnutrition: In the context of chronic illness which includes her alcoholism as evidenced by mild to moderate fat and muscle depletion.  Seen by nutrition.  Put on Ensure twice daily plus multivitamin.    Rhabdomyolysis: Now resolved.  Secondary to prolonged immobilization on floor prior to admission.  CPK continues to trend downward.    Lumbar nerve root impingement: No significant impingement.  Cleared by neurosurgery.    Bradycardia: Heart rate on admission noted to be 40s to 50s.  Much improved, now 60s to 70s    Hypokalemia: Replace as needed.  Recheck labs in the morning    Anxiety: Stable  Alcohol abuse: Updated daughter by phone who informed us that patient has issues with alcohol abuse.  She was glad that the patient is in the hospital and from here is going to skilled facility which will help keep her away from alcohol.  Patient is  out of window for withdrawals  Dysuria: Checking urinalysis   Code Status: Full code  Family Communication: Left message for daughter.  Disposition Plan: Skilled nursing, hopefully in next few days   Consultants:  Neurosurgery  Physical medicine rehab  Procedures:  None  Antimicrobials:  None  DVT prophylaxis: Lovenox   Objective: Vitals:   01/31/20 0527 01/31/20 0813  BP: 129/63  126/62  Pulse: 73 87  Resp: 16 16  Temp: 98.2 F (36.8 C) 97.9 F (36.6 C)  SpO2: 93% 94%    Intake/Output Summary (Last 24 hours) at 01/31/2020 1519 Last data filed at 01/31/2020 1200 Gross per 24 hour  Intake 849 ml  Output 651 ml  Net 198 ml   Filed Weights   01/25/20 1507 01/28/20 2255 01/30/20 2047  Weight: 111.1 kg 102.2 kg 111.1 kg   Body mass index is 44.81 kg/m.  Exam:   General: Alert and oriented x3, no acute distress  HEENT: Normocephalic and atraumatic, mucous membranes moist  Cardiovascular: Regular rate and rhythm, S1-S2  Respiratory: Clear auscultation bilaterally  Abdomen: Soft, nontender, nondistended, positive bowel sounds  Musculoskeletal: No clubbing or cyanosis or edema  Skin: No skin breaks, tears or lesions  Psychiatry: Appropriate, no evidence of psychoses   Data Reviewed: CBC: Recent Labs  Lab 01/25/20 0335 01/26/20 0405 01/27/20 0151 01/28/20 0339  WBC 6.4 4.5 4.9 5.8  NEUTROABS 5.6  --   --   --   HGB 11.9* 11.1* 9.9* 10.0*  HCT 39.3 36.7 30.8* 30.5*  MCV 84.2 85.3 83.5 80.9  PLT 149* PLATELET CLUMPS NOTED ON SMEAR, UNABLE TO ESTIMATE PLATELET CLUMPS NOTED ON SMEAR, UNABLE TO ESTIMATE 123XX123*   Basic Metabolic Panel: Recent Labs  Lab 01/25/20 0335 01/26/20 0405 01/27/20 0151 01/28/20 0339  NA 134* 134* 137 137  K 3.1* 3.5 2.8* 3.6  CL 95* 101 106 105  CO2 24 21* 21* 22  GLUCOSE 175* 119* 128* 109*  BUN 23 15 12 10   CREATININE 0.98 0.77 0.73 0.67  CALCIUM 8.1* 7.7* 7.5* 7.6*   GFR: Estimated Creatinine Clearance: 81.4 mL/min (by C-G formula based on SCr of 0.67 mg/dL). Liver Function Tests: Recent Labs  Lab 01/26/20 0405  AST 51*  ALT 33  ALKPHOS 140*  BILITOT 1.0  PROT 5.2*  ALBUMIN 2.2*   No results for input(s): LIPASE, AMYLASE in the last 168 hours. No results for input(s): AMMONIA in the last 168 hours. Coagulation Profile: No results for input(s): INR, PROTIME in the last 168 hours. Cardiac  Enzymes: Recent Labs  Lab 01/25/20 0335 01/26/20 0405 01/27/20 0151  CKTOTAL 3,004* 964* 559*   BNP (last 3 results) No results for input(s): PROBNP in the last 8760 hours. HbA1C: No results for input(s): HGBA1C in the last 72 hours. CBG: No results for input(s): GLUCAP in the last 168 hours. Lipid Profile: No results for input(s): CHOL, HDL, LDLCALC, TRIG, CHOLHDL, LDLDIRECT in the last 72 hours. Thyroid Function Tests: No results for input(s): TSH, T4TOTAL, FREET4, T3FREE, THYROIDAB in the last 72 hours. Anemia Panel: No results for input(s): VITAMINB12, FOLATE, FERRITIN, TIBC, IRON, RETICCTPCT in the last 72 hours. Urine analysis:    Component Value Date/Time   COLORURINE YELLOW 12/13/2016 1102   APPEARANCEUR CLEAR 12/13/2016 1102   LABSPEC 1.018 12/13/2016 1102   PHURINE 5.0 12/13/2016 1102   GLUCOSEU NEGATIVE 12/13/2016 1102   HGBUR NEGATIVE 12/13/2016 1102   BILIRUBINUR NEGATIVE 12/13/2016 1102   BILIRUBINUR neg 03/07/2014 [place] 12/13/2016  1102   PROTEINUR 30 (A) 12/13/2016 1102   UROBILINOGEN negative 03/07/2014 1459   UROBILINOGEN 0.2 03/04/2013 1710   NITRITE NEGATIVE 12/13/2016 1102   LEUKOCYTESUR TRACE (A) 12/13/2016 1102   Sepsis Labs: @LABRCNTIP (procalcitonin:4,lacticidven:4)  ) Recent Results (from the past 240 hour(s))  Resp Panel by RT-PCR (Flu A&B, Covid) Nasopharyngeal Swab     Status: None   Collection Time: 01/25/20  6:12 AM   Specimen: Nasopharyngeal Swab; Nasopharyngeal(NP) swabs in vial transport medium  Result Value Ref Range Status   SARS Coronavirus 2 by RT PCR NEGATIVE NEGATIVE Final    Comment: (NOTE) SARS-CoV-2 target nucleic acids are NOT DETECTED.  The SARS-CoV-2 RNA is generally detectable in upper respiratory specimens during the acute phase of infection. The lowest concentration of SARS-CoV-2 viral copies this assay can detect is 138 copies/mL. A negative result does not preclude SARS-Cov-2 infection and  should not be used as the sole basis for treatment or other patient management decisions. A negative result may occur with  improper specimen collection/handling, submission of specimen other than nasopharyngeal swab, presence of viral mutation(s) within the areas targeted by this assay, and inadequate number of viral copies(<138 copies/mL). A negative result must be combined with clinical observations, patient history, and epidemiological information. The expected result is Negative.  Fact Sheet for Patients:  01/27/20  Fact Sheet for Healthcare Providers:  BloggerCourse.com  This test is no t yet approved or cleared by the [place] FDA and  has been authorized for detection and/or diagnosis of SARS-CoV-2 by FDA under an Emergency Use Authorization (EUA). This EUA will remain  in effect (meaning this test can be used) for the duration of the COVID-19 declaration under Section 564(b)(1) of the Act, 21 U.S.C.section 360bbb-3(b)(1), unless the authorization is terminated  or revoked sooner.       Influenza A by PCR NEGATIVE NEGATIVE Final   Influenza B by PCR NEGATIVE NEGATIVE Final    Comment: (NOTE) The Xpert Xpress SARS-CoV-2/FLU/RSV plus assay is intended as an aid in the diagnosis of influenza from Nasopharyngeal swab specimens and should not be used as a sole basis for treatment. Nasal washings and aspirates are unacceptable for Xpert Xpress SARS-CoV-2/FLU/RSV testing.  Fact Sheet for Patients: [place]  Fact Sheet for Healthcare Providers: BloggerCourse.com  This test is not yet approved or cleared by the [place] FDA and has been authorized for detection and/or diagnosis of SARS-CoV-2 by FDA under an Emergency Use Authorization (EUA). This EUA will remain in effect (meaning this test can be used) for the duration of the COVID-19 declaration  under Section 564(b)(1) of the Act, 21 U.S.C. section 360bbb-3(b)(1), unless the authorization is terminated or revoked.  Performed at [facility], [address], [place], [place]       Studies: No results found.  Scheduled Meds:  aspirin EC  81 mg Oral Daily   busPIRone  20 mg Oral BID   enoxaparin (LOVENOX) injection  40 mg Subcutaneous Q24H   FLUoxetine  40 mg Oral Daily   gabapentin  300 mg Oral QHS   hydrOXYzine  50 mg Oral Daily   And   hydrOXYzine  100 mg Oral QHS   lamoTRIgine  100 mg Oral Daily   multivitamin with minerals  1 tablet Oral Daily   Ensure Max Protein  11 oz Oral BID   QUEtiapine  100 mg Oral QHS   sodium chloride flush  3 mL Intravenous Q12H    Continuous Infusions:  LOS: 6 days     Annita Brod, MD [facility]   01/31/2020, 3:19 PM

## 2020-02-01 DIAGNOSIS — F0630 Mood disorder due to known physiological condition, unspecified: Secondary | ICD-10-CM | POA: Diagnosis not present

## 2020-02-01 DIAGNOSIS — I10 Essential (primary) hypertension: Secondary | ICD-10-CM | POA: Diagnosis not present

## 2020-02-01 DIAGNOSIS — M6282 Rhabdomyolysis: Secondary | ICD-10-CM | POA: Diagnosis not present

## 2020-02-01 DIAGNOSIS — R531 Weakness: Secondary | ICD-10-CM | POA: Diagnosis not present

## 2020-02-01 LAB — BASIC METABOLIC PANEL
Anion gap: 8 (ref 5–15)
BUN: 9 mg/dL (ref 8–23)
CO2: 26 mmol/L (ref 22–32)
Calcium: 7.5 mg/dL — ABNORMAL LOW (ref 8.9–10.3)
Chloride: 105 mmol/L (ref 98–111)
Creatinine, Ser: 0.68 mg/dL (ref 0.44–1.00)
GFR, Estimated: >60 mL/min (ref >60)
Glucose, Bld: 93 mg/dL (ref 70–99)
Potassium: 3.1 mmol/L — ABNORMAL LOW (ref 3.5–5.1)
Sodium: 139 mmol/L (ref 135–145)

## 2020-02-01 MED ORDER — HYDROXYZINE HCL 25 MG PO TABS
50.0000 mg | ORAL_TABLET | Freq: Three times a day (TID) | ORAL | Status: DC
  Administered 2020-02-01 – 2020-02-07 (×18): 50 mg via ORAL
  Filled 2020-02-01 (×18): qty 2

## 2020-02-01 MED ORDER — POTASSIUM CHLORIDE 20 MEQ PO PACK
20.0000 meq | PACK | Freq: Once | ORAL | Status: AC
  Administered 2020-02-01: 20 meq via ORAL
  Filled 2020-02-01: qty 1

## 2020-02-01 MED ORDER — HYDROXYZINE HCL 25 MG PO TABS
50.0000 mg | ORAL_TABLET | Freq: Every day | ORAL | Status: DC
  Filled 2020-02-01: qty 2

## 2020-02-01 NOTE — Progress Notes (Signed)
PROGRESS NOTE  Tamara Mccullough DGU:[id number] DOB: 1955/02/12 DOA: 01/25/2020 PCP: Roger Kill, PA-C  HPI/Recap of past 75 hours: 64 year old female with past medical history significant for hypertension, hypothyroidism, COVID-19 infection this past April and chronic pain who underwent a L5/S1 interbody fusion for foraminal stenosis with radiculopathy on August 31.  Following the procedure, patient states that she went home and was barely able to walk.  While pain had much improved, she was having right-sided numbness from the knee down to her toes as well as feelings of pins-and-needles in her right foot.  Patient had had physical therapy coming to her house.  She also underwent shock therapy for her mood disorder 5 days prior.  The day after she was fine, but then she says 3 days ago she was unable to get up.  She tried to get out of bed and slid to the floor where she lived for 15 hours until paramedics were called.  They were able to get her back to bed, and she did not want to come into the hospital.  The following day, she was try to get up to go the bathroom again felt but was able to call family to call 911 and this time she came to the hospital which was 12/21.  Patient was evaluated and found to have a CK level of 3004.  MRI of the lumbar spine noted recent fusion with regressing noncompressive fluid collection marrow edema and some mild L4-5 right foraminal impingement.  Patient was admitted to the hospital service for rhabdomyolysis.  Patient was evaluated by neurosurgery and after review of MRI, felt that patient's main issues were from deconditioning and weakness as she has had virtually no activity in the last few days.  PT and OT recommending CIR. after discussion with inpatient rehab physician, patient opted for SNF.  PASARR & insurance authorization pending.  Patient doing okay today.  No complaints.  Muscle relaxers have helped her lower back.  Assessment/Plan: Principal  Problem:   Generalized weakness: Needs a lot of physical therapy.  Have added muscle relaxer for back.  Skilled nursing bed once [employer] authorization goes through. Active Problems:   Essential hypertension, benign: Continue to remain stable.  Continue home medications.    Mood disorder in conditions classified elsewhere: Status post shock therapy.  Continue home medications.  Morbid obesity: Patient with poor appetite and weight loss.  Reported that she has lost over 40 pounds since April because of Covid.  Meets criteria for morbid obesity given BMI greater than 40.    Moderate protein calorie malnutrition: In the context of chronic illness which includes her alcoholism as evidenced by mild to moderate fat and muscle depletion.  Seen by nutrition.  Put on Ensure twice daily plus multivitamin.    Rhabdomyolysis: Now resolved.  Secondary to prolonged immobilization on floor prior to admission.  CPK continues to trend downward.    Lumbar nerve root impingement: No significant impingement.  Cleared by neurosurgery.    Bradycardia: Heart rate on admission noted to be 40s to 50s.  Much improved, now 60s to 70s.    Hypokalemia: Replace as needed.  Recheck labs in the morning    Anxiety: Stable  Alcohol abuse: Updated daughter by phone who informed us that patient has issues with alcohol abuse.  She was glad that the patient is in the hospital and from here is going to skilled facility which will help keep her away from alcohol.  Patient is out of  window for withdrawals  Dysuria complaints: Check u/a on 12/27 & was clear.   Code Status: Full code  Family Communication: Left message for daughter.  Disposition Plan: Skilled nursing, hopefully soon   Consultants:  Neurosurgery  Physical medicine rehab  Procedures:  None  Antimicrobials:  None  DVT prophylaxis: Lovenox   Objective: Vitals:   02/01/20 0534 02/01/20 1008  BP: 133/68 (!) 148/74  Pulse: 71 72  Resp:  18 20  Temp: 99.1 F (37.3 C) 98.2 F (36.8 C)  SpO2: 93% 96%    Intake/Output Summary (Last 24 hours) at 02/01/2020 1244 Last data filed at 02/01/2020 1030 Gross per 24 hour  Intake 600 ml  Output 1020 ml  Net -420 ml   Filed Weights   01/28/20 2255 01/30/20 2047 01/31/20 2226  Weight: 102.2 kg 111.1 kg 112.9 kg   Body mass index is 45.54 kg/m.  Exam: No change from previous day  General: Alert and oriented x3, no acute distress  HEENT: Normocephalic and atraumatic, mucous membranes moist  Cardiovascular: Regular rate and rhythm, S1-S2  Respiratory: Clear auscultation bilaterally  Abdomen: Soft, nontender, nondistended, positive bowel sounds  Musculoskeletal: No clubbing or cyanosis or edema  Skin: No skin breaks, tears or lesions  Psychiatry: Appropriate, no evidence of psychoses   Data Reviewed: CBC: Recent Labs  Lab 01/26/20 0405 01/27/20 0151 01/28/20 0339  WBC 4.5 4.9 5.8  HGB 11.1* 9.9* 10.0*  HCT 36.7 30.8* 30.5*  MCV 85.3 83.5 80.9  PLT PLATELET CLUMPS NOTED ON SMEAR, UNABLE TO ESTIMATE PLATELET CLUMPS NOTED ON SMEAR, UNABLE TO ESTIMATE 123XX123*   Basic Metabolic Panel: Recent Labs  Lab 01/26/20 0405 01/27/20 0151 01/28/20 0339 02/01/20 0326  NA 134* 137 137 139  K 3.5 2.8* 3.6 3.1*  CL 101 106 105 105  CO2 21* 21* 22 26  GLUCOSE 119* 128* 109* 93  BUN 15 12 10 9   CREATININE 0.77 0.73 0.67 0.68  CALCIUM 7.7* 7.5* 7.6* 7.5*   GFR: Estimated Creatinine Clearance: 82.1 mL/min (by C-G formula based on SCr of 0.68 mg/dL). Liver Function Tests: Recent Labs  Lab 01/26/20 0405  AST 51*  ALT 33  ALKPHOS 140*  BILITOT 1.0  PROT 5.2*  ALBUMIN 2.2*   No results for input(s): LIPASE, AMYLASE in the last 168 hours. No results for input(s): AMMONIA in the last 168 hours. Coagulation Profile: No results for input(s): INR, PROTIME in the last 168 hours. Cardiac Enzymes: Recent Labs  Lab 01/26/20 0405 01/27/20 0151  CKTOTAL 964* 559*    BNP (last 3 results) No results for input(s): PROBNP in the last 8760 hours. HbA1C: No results for input(s): HGBA1C in the last 72 hours. CBG: No results for input(s): GLUCAP in the last 168 hours. Lipid Profile: No results for input(s): CHOL, HDL, LDLCALC, TRIG, CHOLHDL, LDLDIRECT in the last 72 hours. Thyroid Function Tests: No results for input(s): TSH, T4TOTAL, FREET4, T3FREE, THYROIDAB in the last 72 hours. Anemia Panel: No results for input(s): VITAMINB12, FOLATE, FERRITIN, TIBC, IRON, RETICCTPCT in the last 72 hours. Urine analysis:    Component Value Date/Time   COLORURINE YELLOW 01/31/2020 1520   APPEARANCEUR CLEAR 01/31/2020 1520   LABSPEC 1.009 01/31/2020 1520   PHURINE 7.0 01/31/2020 1520   GLUCOSEU NEGATIVE 01/31/2020 1520   HGBUR NEGATIVE 01/31/2020 [place] 01/31/2020 1520   BILIRUBINUR neg 03/07/2014 1459   KETONESUR 20 (A) 01/31/2020 1520   PROTEINUR NEGATIVE 01/31/2020 1520   UROBILINOGEN negative 03/07/2014 1459  UROBILINOGEN 0.2 03/04/2013 1710   NITRITE NEGATIVE 01/31/2020 1520   LEUKOCYTESUR NEGATIVE 01/31/2020 1520   Sepsis Labs: @LABRCNTIP (procalcitonin:4,lacticidven:4)  ) Recent Results (from the past 240 hour(s))  Resp Panel by RT-PCR (Flu A&B, Covid) Nasopharyngeal Swab     Status: None   Collection Time: 01/25/20  6:12 AM   Specimen: Nasopharyngeal Swab; Nasopharyngeal(NP) swabs in vial transport medium  Result Value Ref Range Status   SARS Coronavirus 2 by RT PCR NEGATIVE NEGATIVE Final    Comment: (NOTE) SARS-CoV-2 target nucleic acids are NOT DETECTED.  The SARS-CoV-2 RNA is generally detectable in upper respiratory specimens during the acute phase of infection. The lowest concentration of SARS-CoV-2 viral copies this assay can detect is 138 copies/mL. A negative result does not preclude SARS-Cov-2 infection and should not be used as the sole basis for treatment or other patient management decisions. A negative  result may occur with  improper specimen collection/handling, submission of specimen other than nasopharyngeal swab, presence of viral mutation(s) within the areas targeted by this assay, and inadequate number of viral copies(<138 copies/mL). A negative result must be combined with clinical observations, patient history, and epidemiological information. The expected result is Negative.  Fact Sheet for Patients:  EntrepreneurPulse.com.au  Fact Sheet for Healthcare Providers:  IncredibleEmployment.be  This test is no t yet approved or cleared by the [place] FDA and  has been authorized for detection and/or diagnosis of SARS-CoV-2 by FDA under an Emergency Use Authorization (EUA). This EUA will remain  in effect (meaning this test can be used) for the duration of the COVID-19 declaration under Section 564(b)(1) of the Act, 21 U.S.C.section 360bbb-3(b)(1), unless the authorization is terminated  or revoked sooner.       Influenza A by PCR NEGATIVE NEGATIVE Final   Influenza B by PCR NEGATIVE NEGATIVE Final    Comment: (NOTE) The Xpert Xpress SARS-CoV-2/FLU/RSV plus assay is intended as an aid in the diagnosis of influenza from Nasopharyngeal swab specimens and should not be used as a sole basis for treatment. Nasal washings and aspirates are unacceptable for Xpert Xpress SARS-CoV-2/FLU/RSV testing.  Fact Sheet for Patients: EntrepreneurPulse.com.au  Fact Sheet for Healthcare Providers: IncredibleEmployment.be  This test is not yet approved or cleared by the [place] FDA and has been authorized for detection and/or diagnosis of SARS-CoV-2 by FDA under an Emergency Use Authorization (EUA). This EUA will remain in effect (meaning this test can be used) for the duration of the COVID-19 declaration under Section 564(b)(1) of the Act, 21 U.S.C. section 360bbb-3(b)(1), unless the authorization is  terminated or revoked.  Performed at [facility], [place] [address], [place], [place] [address]       Studies: No results found.  Scheduled Meds: . aspirin EC  81 mg Oral Daily  . busPIRone  20 mg Oral BID  . enoxaparin (LOVENOX) injection  40 mg Subcutaneous Q24H  . FLUoxetine  40 mg Oral Daily  . gabapentin  300 mg Oral QHS  . hydrOXYzine  50 mg Oral Daily   And  . hydrOXYzine  100 mg Oral QHS  . lamoTRIgine  100 mg Oral Daily  . multivitamin with minerals  1 tablet Oral Daily  . Ensure Max Protein  11 oz Oral BID  . QUEtiapine  100 mg Oral QHS  . sodium chloride flush  3 mL Intravenous Q12H    Continuous Infusions:    LOS: 7 days     Annita Brod, MD [facility]   02/01/2020,  12:44 PM

## 2020-02-01 NOTE — Care Management Important Message (Signed)
Important Message  Patient Details  Name: Tamara Mccullough MRN: [id number] Date of Birth: 07/11/1955   Medicare Important Message Given:  Yes     Dorena Bodo 02/01/2020, 3:41 PM

## 2020-02-02 DIAGNOSIS — I10 Essential (primary) hypertension: Secondary | ICD-10-CM | POA: Diagnosis not present

## 2020-02-02 DIAGNOSIS — M6282 Rhabdomyolysis: Secondary | ICD-10-CM | POA: Diagnosis not present

## 2020-02-02 DIAGNOSIS — R531 Weakness: Secondary | ICD-10-CM | POA: Diagnosis not present

## 2020-02-02 DIAGNOSIS — F0630 Mood disorder due to known physiological condition, unspecified: Secondary | ICD-10-CM | POA: Diagnosis not present

## 2020-02-02 MED ORDER — POTASSIUM CHLORIDE CRYS ER 20 MEQ PO TBCR
40.0000 meq | EXTENDED_RELEASE_TABLET | Freq: Once | ORAL | Status: AC
  Administered 2020-02-02: 40 meq via ORAL
  Filled 2020-02-02: qty 2

## 2020-02-02 NOTE — Progress Notes (Signed)
Physical Therapy Treatment Patient Details Name: Tamara Mccullough MRN: [id number] DOB: 06-01-1955 Today's Date: 02/02/2020    History of Present Illness Pt is a 64yo female s/p fall, lying on floor x15 hours. Found to have rhabdo, R sided weakness. MRI lumbar: L4-5 right foraminal impingementPMHx: L5-S1 sx 09/2019. ACDF 11/2013.    PT Comments    Patient received in bed, happy to see therapist and eager to try to progress her mobility. Able to mobilize on a min guard basis with RW today with increased time and effort, and did well improving gait distance. Has better awareness of what she needs to be safe before getting up to walk (hospital socks, walker, mask) but still with overall impaired awareness of safety and deficits. Left up in recliner with all needs met, chair alarm active. Progressing well.     Follow Up Recommendations  SNF     Equipment Recommendations  Rolling walker with 5" wheels;3in1 (PT);Wheelchair (measurements PT);Wheelchair cushion (measurements PT)    Recommendations for Other Services       Precautions / Restrictions Precautions Precautions: Fall;Other (comment) Precaution Comments: multiple falls in past 6 mos Restrictions Weight Bearing Restrictions: No    Mobility  Bed Mobility Overal bed mobility: Needs Assistance Bed Mobility: Supine to Sit Rolling: Min guard   Supine to sit: Min guard;HOB elevated     General bed mobility comments: able to roll with use of rail and got herself from side to sit with min guard/increased time and effort  Transfers Overall transfer level: Needs assistance Equipment used: Rolling walker (2 wheeled) Transfers: Sit to/from Stand Sit to Stand: Min guard         General transfer comment: min guard to boost up, did need intermittent cues for hand placement especially when going from stand to sit  Ambulation/Gait Ambulation/Gait assistance: Min guard Gait Distance (Feet): 180 Feet Assistive device: Rolling  walker (2 wheeled) Gait Pattern/deviations: Step-through pattern;Decreased step length - right;Decreased step length - left;Decreased stride length;Decreased weight shift to right;Decreased weight shift to left;Trunk flexed;Wide base of support Gait velocity: decreased   General Gait Details: slow and steady with RW, able to continue progressing gait distance today without difficulty   Stairs             Wheelchair Mobility    Modified Rankin (Stroke Patients Only)       Balance Overall balance assessment: Needs assistance;History of Falls Sitting-balance support: Bilateral upper extremity supported;Feet supported Sitting balance-Leahy Scale: Good     Standing balance support: Bilateral upper extremity supported;During functional activity Standing balance-Leahy Scale: Fair Standing balance comment: hx of multiple recent falls, heavy reliance on BUE support                            Cognition Arousal/Alertness: Awake/alert Behavior During Therapy: Flat affect Overall Cognitive Status: Impaired/Different from baseline Area of Impairment: Safety/judgement;Problem solving;Awareness                     Memory: Decreased short-term memory   Safety/Judgement: Decreased awareness of safety;Decreased awareness of deficits Awareness: Emergent Problem Solving: Slow processing;Decreased initiation;Difficulty sequencing;Requires verbal cues General Comments: still with STM deficits but has improving awareness of what she needs to get OOB and before getting up, still with reduced safety awareness      Exercises      General Comments        Pertinent Vitals/Pain Pain Assessment: No/denies pain Pain Score: 0-No  pain Pain Intervention(s): Limited activity within patient's tolerance;Monitored during session    Home Living                      Prior Function            PT Goals (current goals can now be found in the care plan section) Acute  Rehab PT Goals Patient Stated Goal: go to SNF PT Goal Formulation: With patient Time For Goal Achievement: 02/09/20 Potential to Achieve Goals: Fair    Frequency    Min 3X/week      PT Plan Current plan remains appropriate    Co-evaluation              AM-PAC PT "6 Clicks" Mobility   Outcome Measure  Help needed turning from your back to your side while in a flat bed without using bedrails?: A Little Help needed moving from lying on your back to sitting on the side of a flat bed without using bedrails?: A Little Help needed moving to and from a bed to a chair (including a wheelchair)?: A Little Help needed standing up from a chair using your arms (e.g., wheelchair or bedside chair)?: A Little Help needed to walk in hospital room?: A Little Help needed climbing 3-5 steps with a railing? : A Little 6 Click Score: 18    End of Session   Activity Tolerance: Patient tolerated treatment well Patient left: in chair;with call bell/phone within reach;with chair alarm set Nurse Communication: Mobility status PT Visit Diagnosis: Unsteadiness on feet (R26.81);Difficulty in walking, not elsewhere classified (R26.2);Repeated falls (R29.6);Muscle weakness (generalized) (M62.81)     Time: 5732-2025 PT Time Calculation (min) (ACUTE ONLY): 14 min  Charges:  $Gait Training: 8-22 mins                     Windell Norfolk, DPT, PN1   Supplemental Physical Therapist [place]    Pager [phone] Acute Rehab Office [phone]

## 2020-02-02 NOTE — Progress Notes (Signed)
PROGRESS NOTE  Tamara Mccullough ZOX:[id number] DOB: Jun 06, 1955 DOA: 01/25/2020 PCP: Roger Kill, PA-C  HPI/Recap of past 76 hours: 64 year old female with past medical history significant for hypertension, hypothyroidism, COVID-19 infection this past April and chronic pain who underwent a L5/S1 interbody fusion for foraminal stenosis with radiculopathy on August 31.  Following the procedure, patient states that she went home and was barely able to walk.  While pain had much improved, she was having right-sided numbness from the knee down to her toes as well as feelings of pins-and-needles in her right foot.  Patient had had physical therapy coming to her house.  She also underwent shock therapy for her mood disorder 5 days prior.  The day after she was fine, but then she says 3 days ago she was unable to get up.  She tried to get out of bed and slid to the floor where she lived for 15 hours until paramedics were called.  They were able to get her back to bed, and she did not want to come into the hospital.  The following day, she was try to get up to go the bathroom again felt but was able to call family to call 911 and this time she came to the hospital which was 12/21.  Patient was evaluated and found to have a CK level of 3004.  MRI of the lumbar spine noted recent fusion with regressing noncompressive fluid collection marrow edema and some mild L4-5 right foraminal impingement.  Patient was admitted to the hospital service for rhabdomyolysis.  Patient was evaluated by neurosurgery and after review of MRI, felt that patient's main issues were from deconditioning and weakness as she has had virtually no activity in the last few days.  PT and OT recommending CIR. after discussion with inpatient rehab physician, patient opted for SNF.  PASARR & insurance authorization pending.  Patient doing okay today.  No new complaints  Assessment/Plan: Principal Problem:   Generalized weakness: Needs a  lot of physical therapy.  Have added muscle relaxer for back.  Skilled nursing bed once [employer] authorization goes through. Active Problems:   Essential hypertension, benign: Continue to remain stable.  Continue home medications.    Mood disorder in conditions classified elsewhere: Status post shock therapy.  Continue home medications.  Morbid obesity: Patient with poor appetite and weight loss.  Reported that she has lost over 40 pounds since April because of Covid.  Meets criteria for morbid obesity given BMI greater than 40.    Moderate protein calorie malnutrition: In the context of chronic illness which includes her alcoholism as evidenced by mild to moderate fat and muscle depletion.  Seen by nutrition.  Put on Ensure twice daily plus multivitamin.    Rhabdomyolysis: Now resolved.  Secondary to prolonged immobilization on floor prior to admission.  CPK continues to trend downward.    Lumbar nerve root impingement: No significant impingement.  Cleared by neurosurgery.    Bradycardia: Heart rate on admission noted to be 40s to 50s.  Much improved, now 60s to 70s.    Hypokalemia: Replace as needed.  Potassium today at 3.1, so we will give further potassium.  Recheck in the morning plus magnesium level    Anxiety: Stable  Alcohol abuse: Updated daughter by phone who informed us that patient has issues with alcohol abuse.  She was glad that the patient is in the hospital and from here is going to skilled facility which will help keep her away from  alcohol.  Patient is out of window for withdrawals  Dysuria complaints: Check u/a on 12/27 & was clear.   Code Status: Full code  Family Communication: Left message for daughter.  Disposition Plan: Skilled nursing, hopefully approved soon   Consultants:  Neurosurgery  Physical medicine rehab  Procedures:  None  Antimicrobials:  None  DVT prophylaxis: Lovenox   Objective: Vitals:   02/02/20 0455 02/02/20 0910   BP: (!) 142/70 132/68  Pulse: 72 72  Resp: 18 18  Temp: 98 F (36.7 C) 98 F (36.7 C)  SpO2: 94% 94%    Intake/Output Summary (Last 24 hours) at 02/02/2020 1355 Last data filed at 02/02/2020 0844 Gross per 24 hour  Intake 603 ml  Output 375 ml  Net 228 ml   Filed Weights   01/30/20 2047 01/31/20 2226 02/01/20 2059  Weight: 111.1 kg 112.9 kg 112.5 kg   Body mass index is 45.36 kg/m.  Exam:   General: Alert and oriented x3, no acute distress  HEENT: Normocephalic and atraumatic, mucous membranes moist  Cardiovascular: Regular rate and rhythm, S1-S2  Respiratory: Clear auscultation bilaterally  Abdomen: Soft, nontender, nondistended, positive bowel sounds  Musculoskeletal: No clubbing or cyanosis or edema  Skin: No skin breaks, tears or lesions  Psychiatry: Appropriate, no evidence of psychoses   Data Reviewed: CBC: Recent Labs  Lab 01/27/20 0151 01/28/20 0339  WBC 4.9 5.8  HGB 9.9* 10.0*  HCT 30.8* 30.5*  MCV 83.5 80.9  PLT PLATELET CLUMPS NOTED ON SMEAR, UNABLE TO ESTIMATE 673*   Basic Metabolic Panel: Recent Labs  Lab 01/27/20 0151 01/28/20 0339 02/01/20 0326  NA 137 137 139  K 2.8* 3.6 3.1*  CL 106 105 105  CO2 21* 22 26  GLUCOSE 128* 109* 93  BUN 12 10 9   CREATININE 0.73 0.67 0.68  CALCIUM 7.5* 7.6* 7.5*   GFR: Estimated Creatinine Clearance: 82 mL/min (by C-G formula based on SCr of 0.68 mg/dL). Liver Function Tests: No results for input(s): AST, ALT, ALKPHOS, BILITOT, PROT, ALBUMIN in the last 168 hours. No results for input(s): LIPASE, AMYLASE in the last 168 hours. No results for input(s): AMMONIA in the last 168 hours. Coagulation Profile: No results for input(s): INR, PROTIME in the last 168 hours. Cardiac Enzymes: Recent Labs  Lab 01/27/20 0151  CKTOTAL 559*   BNP (last 3 results) No results for input(s): PROBNP in the last 8760 hours. HbA1C: No results for input(s): HGBA1C in the last 72 hours. CBG: No results for  input(s): GLUCAP in the last 168 hours. Lipid Profile: No results for input(s): CHOL, HDL, LDLCALC, TRIG, CHOLHDL, LDLDIRECT in the last 72 hours. Thyroid Function Tests: No results for input(s): TSH, T4TOTAL, FREET4, T3FREE, THYROIDAB in the last 72 hours. Anemia Panel: No results for input(s): VITAMINB12, FOLATE, FERRITIN, TIBC, IRON, RETICCTPCT in the last 72 hours. Urine analysis:    Component Value Date/Time   COLORURINE YELLOW 01/31/2020 1520   APPEARANCEUR CLEAR 01/31/2020 1520   LABSPEC 1.009 01/31/2020 1520   PHURINE 7.0 01/31/2020 1520   GLUCOSEU NEGATIVE 01/31/2020 1520   HGBUR NEGATIVE 01/31/2020 1520   BILIRUBINUR NEGATIVE 01/31/2020 1520   BILIRUBINUR neg 03/07/2014 1459   KETONESUR 20 (A) 01/31/2020 1520   PROTEINUR NEGATIVE 01/31/2020 1520   UROBILINOGEN negative 03/07/2014 1459   UROBILINOGEN 0.2 03/04/2013 1710   NITRITE NEGATIVE 01/31/2020 1520   LEUKOCYTESUR NEGATIVE 01/31/2020 1520   Sepsis Labs: @LABRCNTIP (procalcitonin:4,lacticidven:4)  ) Recent Results (from the past 240 hour(s))  Resp Panel by RT-PCR (  Flu A&B, Covid) Nasopharyngeal Swab     Status: None   Collection Time: 01/25/20  6:12 AM   Specimen: Nasopharyngeal Swab; Nasopharyngeal(NP) swabs in vial transport medium  Result Value Ref Range Status   SARS Coronavirus 2 by RT PCR NEGATIVE NEGATIVE Final    Comment: (NOTE) SARS-CoV-2 target nucleic acids are NOT DETECTED.  The SARS-CoV-2 RNA is generally detectable in upper respiratory specimens during the acute phase of infection. The lowest concentration of SARS-CoV-2 viral copies this assay can detect is 138 copies/mL. A negative result does not preclude SARS-Cov-2 infection and should not be used as the sole basis for treatment or other patient management decisions. A negative result may occur with  improper specimen collection/handling, submission of specimen other than nasopharyngeal swab, presence of viral mutation(s) within the areas  targeted by this assay, and inadequate number of viral copies(<138 copies/mL). A negative result must be combined with clinical observations, patient history, and epidemiological information. The expected result is Negative.  Fact Sheet for Patients:  BloggerCourse.com  Fact Sheet for Healthcare Providers:  SeriousBroker.it  This test is no t yet approved or cleared by the [place] FDA and  has been authorized for detection and/or diagnosis of SARS-CoV-2 by FDA under an Emergency Use Authorization (EUA). This EUA will remain  in effect (meaning this test can be used) for the duration of the COVID-19 declaration under Section 564(b)(1) of the Act, 21 U.S.C.section 360bbb-3(b)(1), unless the authorization is terminated  or revoked sooner.       Influenza A by PCR NEGATIVE NEGATIVE Final   Influenza B by PCR NEGATIVE NEGATIVE Final    Comment: (NOTE) The Xpert Xpress SARS-CoV-2/FLU/RSV plus assay is intended as an aid in the diagnosis of influenza from Nasopharyngeal swab specimens and should not be used as a sole basis for treatment. Nasal washings and aspirates are unacceptable for Xpert Xpress SARS-CoV-2/FLU/RSV testing.  Fact Sheet for Patients: BloggerCourse.com  Fact Sheet for Healthcare Providers: SeriousBroker.it  This test is not yet approved or cleared by the [place] FDA and has been authorized for detection and/or diagnosis of SARS-CoV-2 by FDA under an Emergency Use Authorization (EUA). This EUA will remain in effect (meaning this test can be used) for the duration of the COVID-19 declaration under Section 564(b)(1) of the Act, 21 U.S.C. section 360bbb-3(b)(1), unless the authorization is terminated or revoked.  Performed at [facility], [address], [place], [place] [address]       Studies: No results found.  Scheduled Meds: . aspirin  EC  81 mg Oral Daily  . busPIRone  20 mg Oral BID  . enoxaparin (LOVENOX) injection  40 mg Subcutaneous Q24H  . FLUoxetine  40 mg Oral Daily  . gabapentin  300 mg Oral QHS  . hydrOXYzine  50 mg Oral TID  . lamoTRIgine  100 mg Oral Daily  . multivitamin with minerals  1 tablet Oral Daily  . Ensure Max Protein  11 oz Oral BID  . QUEtiapine  100 mg Oral QHS  . sodium chloride flush  3 mL Intravenous Q12H    Continuous Infusions:    LOS: 8 days     Hollice Espy, MD [facility]   02/02/2020, 1:55 PM

## 2020-02-02 NOTE — TOC Progression Note (Signed)
Transition of Care [facility]) - Progression Note    Patient Details  Name: Tamara Mccullough MRN: [id number] Date of Birth: 11-21-1955  [facility]) CM/SW Contact  Okey Dupre Lazaro Arms, LCSW Phone Number: 02/02/2020, 4:21 PM  Clinical Narrative:  CSW talked with Margret Chance, admissions director at [facility] on 12/28 regarding patient's readiness for discharge and insurance authorization. Per Malena Peer, he will initiate authorization.  CSW will continue to follow and facilitate discharge to [facility] once auth received.    Expected Discharge Plan: Skilled Nursing Facility Barriers to Discharge: Continued Medical Work up,SNF Pending bed offer  Expected Discharge Plan and Services Expected Discharge Plan: Skilled Nursing Facility     Post Acute Care Choice: Skilled Nursing Facility Living arrangements for the past 2 months: Single Family Home                                     Social Determinants of Health (SDOH) Interventions  No SDOH interventions requested or needed at this time  Readmission Risk Interventions No flowsheet data found.

## 2020-02-03 DIAGNOSIS — F0630 Mood disorder due to known physiological condition, unspecified: Secondary | ICD-10-CM | POA: Diagnosis not present

## 2020-02-03 DIAGNOSIS — R531 Weakness: Secondary | ICD-10-CM | POA: Diagnosis not present

## 2020-02-03 DIAGNOSIS — M6282 Rhabdomyolysis: Secondary | ICD-10-CM | POA: Diagnosis not present

## 2020-02-03 DIAGNOSIS — I10 Essential (primary) hypertension: Secondary | ICD-10-CM | POA: Diagnosis not present

## 2020-02-03 LAB — CBC
HCT: 29.5 % — ABNORMAL LOW (ref 36.0–46.0)
Hemoglobin: 9 g/dL — ABNORMAL LOW (ref 12.0–15.0)
MCH: 25.8 pg — ABNORMAL LOW (ref 26.0–34.0)
MCHC: 30.5 g/dL (ref 30.0–36.0)
MCV: 84.5 fL (ref 80.0–100.0)
Platelets: 415 10*3/uL — ABNORMAL HIGH (ref 150–400)
RBC: 3.49 MIL/uL — ABNORMAL LOW (ref 3.87–5.11)
RDW: 16.5 % — ABNORMAL HIGH (ref 11.5–15.5)
WBC: 6.7 10*3/uL (ref 4.0–10.5)
nRBC: 0 % (ref 0.0–0.2)

## 2020-02-03 LAB — BASIC METABOLIC PANEL
Anion gap: 9 (ref 5–15)
BUN: 10 mg/dL (ref 8–23)
CO2: 25 mmol/L (ref 22–32)
Calcium: 7.7 mg/dL — ABNORMAL LOW (ref 8.9–10.3)
Chloride: 105 mmol/L (ref 98–111)
Creatinine, Ser: 0.64 mg/dL (ref 0.44–1.00)
GFR, Estimated: >60 mL/min (ref >60)
Glucose, Bld: 104 mg/dL — ABNORMAL HIGH (ref 70–99)
Potassium: 3.8 mmol/L (ref 3.5–5.1)
Sodium: 139 mmol/L (ref 135–145)

## 2020-02-03 LAB — MAGNESIUM: Magnesium: 2 mg/dL (ref 1.7–2.4)

## 2020-02-03 NOTE — Plan of Care (Signed)
  Problem: Nutrition: Goal: Adequate nutrition will be maintained Outcome: Progressing   Problem: Coping: Goal: Level of anxiety will decrease Outcome: Progressing   

## 2020-02-03 NOTE — Progress Notes (Signed)
Occupational Therapy Treatment Patient Details Name: Tamara Mccullough MRN: [id number] DOB: 09-Aug-1955 Today's Date: 02/03/2020    History of present illness Pt is a 64yo female s/p fall, lying on floor x15 hours. Found to have rhabdo, R sided weakness. MRI lumbar: L4-5 right foraminal impingementPMHx: L5-S1 sx 09/2019. ACDF 11/2013.   OT comments  Pt making good progress with functional goals. Pt very motivated to get OOB and work with therapy. Pt ambulated to bathroom with RW for toilet transfers, toileting, bathing and grooming/hygiene. OT will continue to follow acutely to maximize level of function and safety  Follow Up Recommendations  SNF    Equipment Recommendations  3 in 1 bedside commode (TBD at next venue of care)    Recommendations for Other Services      Precautions / Restrictions Precautions Precautions: Fall;Other (comment) Precaution Comments: multiple falls in past 6 mos Restrictions Weight Bearing Restrictions: No       Mobility Bed Mobility Overal bed mobility: Needs Assistance Bed Mobility: Supine to Sit;Sit to Supine     Supine to sit: Min guard Sit to supine: Min guard      Transfers Overall transfer level: Needs assistance Equipment used: Rolling walker (2 wheeled) Transfers: Sit to/from Stand Sit to Stand: Min guard Stand pivot transfers: Min guard       General transfer comment: verbal cues for correct hand placement    Balance Overall balance assessment: Needs assistance;History of Falls Sitting-balance support: Bilateral upper extremity supported;Feet supported Sitting balance-Leahy Scale: Good     Standing balance support: Bilateral upper extremity supported;During functional activity Standing balance-Leahy Scale: Fair                             ADL either performed or assessed with clinical judgement   ADL Overall ADL's : Needs assistance/impaired     Grooming: Min guard;Wash/dry hands;Standing Grooming Details  (indicate cue type and reason): standing at RW Upper Body Bathing: Min guard;Sitting Upper Body Bathing Details (indicate cue type and reason): simulated seated on commode Lower Body Bathing: Sitting/lateral leans;Sit to/from stand;Minimal assistance Lower Body Bathing Details (indicate cue type and reason): simulated standing at RW Upper Body Dressing : Sitting;Min guard Upper Body Dressing Details (indicate cue type and reason): donned clean gown standing at RW Lower Body Dressing: Minimal assistance Lower Body Dressing Details (indicate cue type and reason): donned socks Toilet Transfer: Min guard;Grab bars;Regular Toilet;RW;Ambulation;Cueing for safety   Toileting- Clothing Manipulation and Hygiene: Minimal assistance;Sit to/from stand;Cueing for safety Toileting - Clothing Manipulation Details (indicate cue type and reason): min A with clothing mgt     Functional mobility during ADLs: Min guard;Rolling walker;Cueing for safety       Vision Baseline Vision/History: Wears glasses Wears Glasses: At all times Patient Visual Report: No change from baseline     Perception     Praxis      Cognition Arousal/Alertness: Awake/alert Behavior During Therapy: WFL for tasks assessed/performed Overall Cognitive Status: Impaired/Different from baseline Area of Impairment: Safety/judgement;Problem solving;Awareness                     Memory: Decreased short-term memory   Safety/Judgement: Decreased awareness of safety;Decreased awareness of deficits   Problem Solving: Slow processing;Decreased initiation;Difficulty sequencing;Requires verbal cues          Exercises     Shoulder Instructions       General Comments      Pertinent Vitals/ Pain  Pain Assessment: No/denies pain Pain Score: 0-No pain Pain Intervention(s): Monitored during session  Home Living                                          Prior Functioning/Environment               Frequency  Min 2X/week        Progress Toward Goals  OT Goals(current goals can now be found in the care plan section)  Progress towards OT goals: Progressing toward goals  Acute Rehab OT Goals Patient Stated Goal: go to SNF  Plan Discharge plan remains appropriate    Co-evaluation                 AM-PAC OT "6 Clicks" Daily Activity     Outcome Measure   Help from another person eating meals?: None Help from another person taking care of personal grooming?: A Little Help from another person toileting, which includes using toliet, bedpan, or urinal?: A Little Help from another person bathing (including washing, rinsing, drying)?: A Little Help from another person to put on and taking off regular upper body clothing?: A Little Help from another person to put on and taking off regular lower body clothing?: A Little 6 Click Score: 19    End of Session Equipment Utilized During Treatment: Rolling walker;Gait belt  OT Visit Diagnosis: Unsteadiness on feet (R26.81);Muscle weakness (generalized) (M62.81);Pain   Activity Tolerance Patient tolerated treatment well   Patient Left in bed;with call bell/phone within reach   Nurse Communication          Time: 5909-3112 OT Time Calculation (min): 28 min  Charges: OT General Charges $OT Visit: 1 Visit OT Treatments $Self Care/Home Management : 8-22 mins $Therapeutic Activity: 8-22 mins     Galen Manila 02/03/2020, 3:55 PM

## 2020-02-03 NOTE — Progress Notes (Signed)
PROGRESS NOTE  Tamara Mccullough IRW:[id number] DOB: April 22, 1955 DOA: 01/25/2020 PCP: Roger Kill, PA-C  HPI/Recap of past 46 hours: 64 year old female with past medical history significant for hypertension, hypothyroidism, COVID-19 infection this past April and chronic pain who underwent a L5/S1 interbody fusion for foraminal stenosis with radiculopathy on August 31.  Following the procedure, patient states that she went home and was barely able to walk.  While pain had much improved, she was having right-sided numbness from the knee down to her toes as well as feelings of pins-and-needles in her right foot.  Patient had had physical therapy coming to her house.  She also underwent shock therapy for her mood disorder 5 days prior.  The day after she was fine, but then she says 3 days ago she was unable to get up.  She tried to get out of bed and slid to the floor where she lived for 15 hours until paramedics were called.  They were able to get her back to bed, and she did not want to come into the hospital.  The following day, she was try to get up to go the bathroom again felt but was able to call family to call 911 and this time she came to the hospital which was 12/21.  Patient was evaluated and found to have a CK level of 3004.  MRI of the lumbar spine noted recent fusion with regressing noncompressive fluid collection marrow edema and some mild L4-5 right foraminal impingement.  Patient was admitted to the hospital service for rhabdomyolysis.  Patient was evaluated by neurosurgery and after review of MRI, felt that patient's main issues were from deconditioning and weakness as she has had virtually no activity in the last few days.  PT and OT recommending CIR. after discussion with inpatient rehab physician, patient opted for SNF.  PASARR & insurance authorization pending.  Patient about the same.  No new complaints.  Working well with physical therapy.  Assessment/Plan: Principal  Problem:   Generalized weakness: Needs a lot of physical therapy.  Have added muscle relaxer for back.  Skilled nursing bed once [employer] authorization goes through. Active Problems:   Essential hypertension, benign: Continue to remain stable.  Continue home medications.    Mood disorder in conditions classified elsewhere: Status post shock therapy.  Continue home medications.  Morbid obesity: Patient with poor appetite and weight loss.  Reported that she has lost over 40 pounds since April because of Covid.  Meets criteria for morbid obesity given BMI greater than 40.    Moderate protein calorie malnutrition: In the context of chronic illness which includes her alcoholism as evidenced by mild to moderate fat and muscle depletion.  Seen by nutrition.  Put on Ensure twice daily plus multivitamin.    Rhabdomyolysis: Now resolved.  Secondary to prolonged immobilization on floor prior to admission.  CPK continues to trend downward.    Lumbar nerve root impingement: No significant impingement.  Cleared by neurosurgery.    Bradycardia: Heart rate on admission noted to be 40s to 50s.  Resolved, now 60s to 70s.    Hypokalemia: Replace as needed.  Potassium and magnesium on 12/30 within normal limits    Anxiety: Stable  Alcohol abuse: Updated daughter by phone who informed us that patient has issues with alcohol abuse.  She was glad that the patient is in the hospital and from here is going to skilled facility which will help keep her away from alcohol.  Patient is out  of window for withdrawals  Dysuria complaints: Checked u/a on 12/27 & was clear.   Code Status: Full code  Family Communication: Left message for daughter.  Disposition Plan: Skilled nursing, hopefully approved soon   Consultants:  Neurosurgery  Physical medicine rehab  Procedures:  None  Antimicrobials:  None  DVT prophylaxis: Lovenox   Objective: Vitals:   02/03/20 0618 02/03/20 0938  BP: 126/86  128/61  Pulse: 69 72  Resp: 18 18  Temp: 98.1 F (36.7 C) 98.1 F (36.7 C)  SpO2: 95% 93%    Intake/Output Summary (Last 24 hours) at 02/03/2020 1558 Last data filed at 02/03/2020 1200 Gross per 24 hour  Intake 483 ml  Output 1150 ml  Net -667 ml   Filed Weights   01/31/20 2226 02/01/20 2059 02/02/20 2111  Weight: 112.9 kg 112.5 kg 112.5 kg   Body mass index is 45.36 kg/m.  Exam: Unchanged from previous day  General: Alert and oriented x3, no acute distress  HEENT: Normocephalic and atraumatic, mucous membranes moist  Cardiovascular: Regular rate and rhythm, S1-S2  Respiratory: Clear auscultation bilaterally  Abdomen: Soft, nontender, nondistended, positive bowel sounds  Musculoskeletal: No clubbing or cyanosis or edema  Skin: No skin breaks, tears or lesions  Psychiatry: Appropriate, no evidence of psychoses   Data Reviewed: CBC: Recent Labs  Lab 01/28/20 0339 02/03/20 0040  WBC 5.8 6.7  HGB 10.0* 9.0*  HCT 30.5* 29.5*  MCV 80.9 84.5  PLT 122* 415*   Basic Metabolic Panel: Recent Labs  Lab 01/28/20 0339 02/01/20 0326 02/03/20 0040  NA 137 139 139  K 3.6 3.1* 3.8  CL 105 105 105  CO2 22 26 25   GLUCOSE 109* 93 104*  BUN 10 9 10   CREATININE 0.67 0.68 0.64  CALCIUM 7.6* 7.5* 7.7*  MG  --   --  2.0   GFR: Estimated Creatinine Clearance: 82 mL/min (by C-G formula based on SCr of 0.64 mg/dL). Liver Function Tests: No results for input(s): AST, ALT, ALKPHOS, BILITOT, PROT, ALBUMIN in the last 168 hours. No results for input(s): LIPASE, AMYLASE in the last 168 hours. No results for input(s): AMMONIA in the last 168 hours. Coagulation Profile: No results for input(s): INR, PROTIME in the last 168 hours. Cardiac Enzymes: No results for input(s): CKTOTAL, CKMB, CKMBINDEX, TROPONINI in the last 168 hours. BNP (last 3 results) No results for input(s): PROBNP in the last 8760 hours. HbA1C: No results for input(s): HGBA1C in the last 72  hours. CBG: No results for input(s): GLUCAP in the last 168 hours. Lipid Profile: No results for input(s): CHOL, HDL, LDLCALC, TRIG, CHOLHDL, LDLDIRECT in the last 72 hours. Thyroid Function Tests: No results for input(s): TSH, T4TOTAL, FREET4, T3FREE, THYROIDAB in the last 72 hours. Anemia Panel: No results for input(s): VITAMINB12, FOLATE, FERRITIN, TIBC, IRON, RETICCTPCT in the last 72 hours. Urine analysis:    Component Value Date/Time   COLORURINE YELLOW 01/31/2020 1520   APPEARANCEUR CLEAR 01/31/2020 1520   LABSPEC 1.009 01/31/2020 1520   PHURINE 7.0 01/31/2020 1520   GLUCOSEU NEGATIVE 01/31/2020 1520   HGBUR NEGATIVE 01/31/2020 1520   BILIRUBINUR NEGATIVE 01/31/2020 1520   BILIRUBINUR neg 03/07/2014 1459   KETONESUR 20 (A) 01/31/2020 1520   PROTEINUR NEGATIVE 01/31/2020 1520   UROBILINOGEN negative 03/07/2014 1459   UROBILINOGEN 0.2 03/04/2013 1710   NITRITE NEGATIVE 01/31/2020 1520   LEUKOCYTESUR NEGATIVE 01/31/2020 1520   Sepsis Labs: @LABRCNTIP (procalcitonin:4,lacticidven:4)  ) Recent Results (from the past 240 hour(s))  Resp Panel by  RT-PCR (Flu A&B, Covid) Nasopharyngeal Swab     Status: None   Collection Time: 01/25/20  6:12 AM   Specimen: Nasopharyngeal Swab; Nasopharyngeal(NP) swabs in vial transport medium  Result Value Ref Range Status   SARS Coronavirus 2 by RT PCR NEGATIVE NEGATIVE Final    Comment: (NOTE) SARS-CoV-2 target nucleic acids are NOT DETECTED.  The SARS-CoV-2 RNA is generally detectable in upper respiratory specimens during the acute phase of infection. The lowest concentration of SARS-CoV-2 viral copies this assay can detect is 138 copies/mL. A negative result does not preclude SARS-Cov-2 infection and should not be used as the sole basis for treatment or other patient management decisions. A negative result may occur with  improper specimen collection/handling, submission of specimen other than nasopharyngeal swab, presence of viral  mutation(s) within the areas targeted by this assay, and inadequate number of viral copies(<138 copies/mL). A negative result must be combined with clinical observations, patient history, and epidemiological information. The expected result is Negative.  Fact Sheet for Patients:  EntrepreneurPulse.com.au  Fact Sheet for Healthcare Providers:  IncredibleEmployment.be  This test is no t yet approved or cleared by the [place] FDA and  has been authorized for detection and/or diagnosis of SARS-CoV-2 by FDA under an Emergency Use Authorization (EUA). This EUA will remain  in effect (meaning this test can be used) for the duration of the COVID-19 declaration under Section 564(b)(1) of the Act, 21 U.S.C.section 360bbb-3(b)(1), unless the authorization is terminated  or revoked sooner.       Influenza A by PCR NEGATIVE NEGATIVE Final   Influenza B by PCR NEGATIVE NEGATIVE Final    Comment: (NOTE) The Xpert Xpress SARS-CoV-2/FLU/RSV plus assay is intended as an aid in the diagnosis of influenza from Nasopharyngeal swab specimens and should not be used as a sole basis for treatment. Nasal washings and aspirates are unacceptable for Xpert Xpress SARS-CoV-2/FLU/RSV testing.  Fact Sheet for Patients: EntrepreneurPulse.com.au  Fact Sheet for Healthcare Providers: IncredibleEmployment.be  This test is not yet approved or cleared by the [place] FDA and has been authorized for detection and/or diagnosis of SARS-CoV-2 by FDA under an Emergency Use Authorization (EUA). This EUA will remain in effect (meaning this test can be used) for the duration of the COVID-19 declaration under Section 564(b)(1) of the Act, 21 U.S.C. section 360bbb-3(b)(1), unless the authorization is terminated or revoked.  Performed at [facility], [place] [address] [place], [place] [address]       Studies: No results  found.  Scheduled Meds: . aspirin EC  81 mg Oral Daily  . busPIRone  20 mg Oral BID  . enoxaparin (LOVENOX) injection  40 mg Subcutaneous Q24H  . FLUoxetine  40 mg Oral Daily  . gabapentin  300 mg Oral QHS  . hydrOXYzine  50 mg Oral TID  . lamoTRIgine  100 mg Oral Daily  . multivitamin with minerals  1 tablet Oral Daily  . Ensure Max Protein  11 oz Oral BID  . QUEtiapine  100 mg Oral QHS  . sodium chloride flush  3 mL Intravenous Q12H    Continuous Infusions:    LOS: 9 days     Annita Brod, MD [facility]   02/03/2020, 3:58 PM

## 2020-02-03 NOTE — Progress Notes (Signed)
Nutrition Follow-up  DOCUMENTATION CODES:   Non-severe (moderate) malnutrition in context of chronic illness,Morbid obesity  INTERVENTION:  Continue Ensure Max BID, each supplement provides 150 kcal and 30 grams of protein.   Continue MVI with minerals    NUTRITION DIAGNOSIS:   Moderate Malnutrition related to chronic illness (chronic pain impacting mobility) as evidenced by mild muscle depletion,moderate muscle depletion,moderate fat depletion,mild fat depletion.  ongoing  GOAL:   Patient will meet greater than or equal to 90% of their needs  progressing  MONITOR:   PO intake,Supplement acceptance,Labs,Weight trends  REASON FOR ASSESSMENT:   Consult Assessment of nutrition requirement/status  ASSESSMENT:   64 y.o. female with PMH of HTN, hypothyroidism, COVID-19 infection in April, and chronic pain who underwent L5/S1 fusion in August. Presents after fall, lying on floor x 15 hours. Admitted for R sided weakness and rhabdomyolysis secondary to prolonged immobility.  Pt pending d/c to SNF, awaiting insurance authorization.   Pt's appetite has improved since last RD assessment. Meal completions documented as 50-100% x last 8 recorded meals (65% average meal intake). Pt has been receiving Ensure Max po BID. Per RN, pt doing well with supplement. Will continue with current nutrition plan of care.  UOP: documented today  Labs and medications reviewed. Pt receiving MVI w/ minerals daily.   Diet Order:   Diet Order            Diet regular Room service appropriate? Yes; Fluid consistency: Thin  Diet effective now                 EDUCATION NEEDS:   No education needs have been identified at this time  Skin:  Skin Assessment: Reviewed RN Assessment  Last BM:  12/28  Height:   Ht Readings from Last 1 Encounters:  01/25/20 5\' 2"  (1.575 m)    Weight:   Wt Readings from Last 1 Encounters:  02/02/20 112.5 kg    Ideal Body Weight:  50 kg  BMI:   Body mass index is 45.36 kg/m.  Estimated Nutritional Needs:   Kcal:  1900-2100 kcal  Protein:  105-125 grams  Fluid:  >2 L/day    02/04/20, MS, RD, LDN RD pager number and weekend/on-call pager number located in Amion.

## 2020-02-04 DIAGNOSIS — R531 Weakness: Secondary | ICD-10-CM | POA: Diagnosis not present

## 2020-02-04 DIAGNOSIS — I10 Essential (primary) hypertension: Secondary | ICD-10-CM | POA: Diagnosis not present

## 2020-02-04 DIAGNOSIS — M6282 Rhabdomyolysis: Secondary | ICD-10-CM | POA: Diagnosis not present

## 2020-02-04 DIAGNOSIS — F0630 Mood disorder due to known physiological condition, unspecified: Secondary | ICD-10-CM | POA: Diagnosis not present

## 2020-02-04 NOTE — Progress Notes (Signed)
Inpatient Rehabilitation-Admissions Coordinator   Received request from attending to re-screen this patient for CIR. At this time, pt is Min G for transfers and Min G for 180 feet. She has had multiple falls in the past 6 months and will most likely require supervision at DC. As pt is not expected to make large functional gains in a short period of time, [facility] would recommend SNF.   Will not pursue CIR for this patient at this time.   Cheri Rous, OTR/L  Rehab Admissions Coordinator  [phone] 02/04/2020 1:59 PM

## 2020-02-04 NOTE — TOC Progression Note (Signed)
Transition of Care [facility]) - Progression Note    Patient Details  Name: EMELINE SIMPSON MRN: [id number] Date of Birth: [birth year]  Transition [facility]) CM/SW Contact  Okey Dupre Lazaro Arms, LCSW Phone Number: 02/04/2020, 3:08 PM  Clinical Narrative:  Communicated with admissions director Malena Peer via HIPPA compliant text (2:23 pm) regarding insurance auth status of patient. No response as of yet. CSW will follow-up with Malena Peer on Monday.    Expected Discharge Plan: Skilled Nursing Facility Barriers to Discharge: Continued Medical Work up,SNF Pending bed offer  Expected Discharge Plan and Services Expected Discharge Plan: Skilled Nursing Facility     Post Acute Care Choice: Skilled Nursing Facility Living arrangements for the past 2 months: Single Family Home                                     Social Determinants of Health (SDOH) Interventions  No SDOH interventions requested or needed at this time.  Readmission Risk Interventions No flowsheet data found.

## 2020-02-04 NOTE — Progress Notes (Signed)
Physical Therapy Treatment Patient Details Name: Tamara Mccullough MRN: [id number] DOB: 1955/02/17 Today's Date: 02/04/2020    History of Present Illness Pt is a 64yo female s/p fall, lying on floor x15 hours. Found to have rhabdo, R sided weakness. MRI lumbar: L4-5 right foraminal impingementPMHx: L5-S1 sx 09/2019. ACDF 11/2013.    PT Comments    Patient received in bed, pleasant but tells me she has been wetting the bed all day until now because "I can't get to the bathroom fast enough and there's no sense in me getting up and them cleaning me/changing the bed if I'm just gonna pee again". Able to mobilize on a min guard basis, did stand impulsively a couple of times with RW when she was performing pericare to her private areas and needed MinA to steady without BUE support. Continues to have impairment in functional problem solving and sequencing, making her a fall risk due to cognition alone. Tried gait training with shoes on today, had very noticeable difference in gait mechanics and functional activity tolerance just from weight of shoes and had a hard time clearing her feet during swing phase of gait. Introduced steps but this was very difficult due to gross weakness and she requires MinA for safety/balance with step navigation. Left up in recliner with all needs met, chair alarm active. Continue to recommend SNF.    Follow Up Recommendations  SNF;Supervision/Assistance - 24 hour     Equipment Recommendations  Rolling walker with 5" wheels;3in1 (PT);Wheelchair (measurements PT);Wheelchair cushion (measurements PT)    Recommendations for Other Services       Precautions / Restrictions Precautions Precautions: Fall;Other (comment) Precaution Comments: multiple falls in past 6 mos Restrictions Weight Bearing Restrictions: No    Mobility  Bed Mobility Overal bed mobility: Needs Assistance Bed Mobility: Supine to Sit Rolling: Min guard   Supine to sit: Min guard     General  bed mobility comments: able to roll with use of rail and got herself from side to sit with min guard/increased time and effort  Transfers Overall transfer level: Needs assistance Equipment used: Rolling walker (2 wheeled) Transfers: Sit to/from Stand Sit to Stand: Min guard         General transfer comment: verbal cues for correct hand placement especially with stand to sit; was impulsive with her standing today and tried to get up without RW when cleaning her private areas  Ambulation/Gait Ambulation/Gait assistance: Min guard Gait Distance (Feet): 180 Feet Assistive device: Rolling walker (2 wheeled) Gait Pattern/deviations: Step-through pattern;Decreased step length - right;Decreased step length - left;Decreased stride length;Decreased weight shift to right;Decreased weight shift to left;Trunk flexed;Wide base of support Gait velocity: decreased   General Gait Details: wore sneakers today- had increase in shuffling/scuffing gait and difficulty really clearing feet well. Cues for heel-toe pattern and improved foot clearance with gait. Became more fatigued due to weight of shoes vs socks   Stairs Stairs: Yes Stairs assistance: Min assist Stair Management: Two rails;Forwards;Step to pattern Number of Stairs: 3 General stair comments: 3 single step ups with B rails and MinA for balance, very off balance and with R knee almost buckling on one attempt   Wheelchair Mobility    Modified Rankin (Stroke Patients Only)       Balance Overall balance assessment: Needs assistance;History of Falls Sitting-balance support: Bilateral upper extremity supported;Feet supported Sitting balance-Leahy Scale: Good     Standing balance support: Bilateral upper extremity supported;During functional activity Standing balance-Leahy Scale: Fair Standing balance comment: hx  of multiple recent falls, heavy reliance on BUE support                            Cognition  Arousal/Alertness: Awake/alert Behavior During Therapy: WFL for tasks assessed/performed Overall Cognitive Status: Impaired/Different from baseline Area of Impairment: Safety/judgement;Problem solving;Awareness                     Memory: Decreased short-term memory   Safety/Judgement: Decreased awareness of safety;Decreased awareness of deficits Awareness: Emergent Problem Solving: Slow processing;Decreased initiation;Difficulty sequencing;Requires verbal cues General Comments: still very confused about what rehab setting is SNF and which is CIR, perseverates on telling me "when will I get to come upstairs with you guys for therapy?". Still with ongoing impaired awareness of safety and at times sequencing and functional problem solving. Would be a high fall risk if left to her own devices independently due to cognition alone      Exercises      General Comments        Pertinent Vitals/Pain Pain Assessment: No/denies pain Pain Score: 0-No pain Pain Intervention(s): Limited activity within patient's tolerance;Monitored during session    Home Living                      Prior Function            PT Goals (current goals can now be found in the care plan section) Acute Rehab PT Goals Patient Stated Goal: go to SNF PT Goal Formulation: With patient Time For Goal Achievement: 02/09/20 Potential to Achieve Goals: Fair Progress towards PT goals: Progressing toward goals    Frequency    Min 3X/week      PT Plan Discharge plan needs to be updated    Co-evaluation              AM-PAC PT "6 Clicks" Mobility   Outcome Measure  Help needed turning from your back to your side while in a flat bed without using bedrails?: A Little Help needed moving from lying on your back to sitting on the side of a flat bed without using bedrails?: A Little Help needed moving to and from a bed to a chair (including a wheelchair)?: A Little Help needed standing up from  a chair using your arms (e.g., wheelchair or bedside chair)?: A Little Help needed to walk in hospital room?: A Little Help needed climbing 3-5 steps with a railing? : A Lot 6 Click Score: 17    End of Session   Activity Tolerance: Patient tolerated treatment well Patient left: in chair;with call bell/phone within reach;with chair alarm set Nurse Communication: Mobility status PT Visit Diagnosis: Unsteadiness on feet (R26.81);Difficulty in walking, not elsewhere classified (R26.2);Repeated falls (R29.6);Muscle weakness (generalized) (M62.81)     Time: 5465-6812 PT Time Calculation (min) (ACUTE ONLY): 23 min  Charges:  $Gait Training: 8-22 mins $Therapeutic Activity: 8-22 mins                     Windell Norfolk, DPT, PN1   Supplemental Physical Therapist [place]    Pager [phone] Acute Rehab Office [phone]

## 2020-02-04 NOTE — Progress Notes (Signed)
PROGRESS NOTE  Tamara Mccullough [id number] DOB: [birth year] DOA: 01/25/2020 PCP: Heywood Bene, PA-C  HPI/Recap of past 8 hours: 64 year old female with past medical history significant for hypertension, hypothyroidism, COVID-19 infection this past April and chronic pain who underwent a L5/S1 interbody fusion for foraminal stenosis with radiculopathy on August 31.  Following the procedure, patient states that she went home and was barely able to walk.  While pain had much improved, she was having right-sided numbness from the knee down to her toes as well as feelings of pins-and-needles in her right foot.  Patient had had physical therapy coming to her house.  She also underwent shock therapy for her mood disorder 5 days prior.  The day after she was fine, but then she says 3 days ago she was unable to get up.  She tried to get out of bed and slid to the floor where she lived for 15 hours until paramedics were called.  They were able to get her back to bed, and she did not want to come into the hospital.  The following day, she was try to get up to go the bathroom again felt but was able to call family to call 911 and this time she came to the hospital which was 12/21.  Patient was evaluated and found to have a CK level of 3004.  MRI of the lumbar spine noted recent fusion with regressing noncompressive fluid collection marrow edema and some mild L4-5 right foraminal impingement.  Patient was admitted to the hospital service for rhabdomyolysis.  Patient was evaluated by neurosurgery and after review of MRI, felt that patient's main issues were from deconditioning and weakness as she has had virtually no activity in the last few days.  PT and OT recommending CIR. after discussion with inpatient rehab physician, patient opted for SNF.  PASARR & insurance authorization pending.  With patient waiting for about a week, she has opted to try to go back to inpatient rehab.  Inpatient rehab notified  and will follow up and evaluate the patient.  Assessment/Plan: Principal Problem:   Generalized weakness: Needs a lot of physical therapy.  Have added muscle relaxer for back.  Initially cleared by inpatient rehab but she declined for skilled nursing.  Since she has been waiting for approval for skilled nursing, and she will try to go back to inpatient rehab Active Problems:   Essential hypertension, benign: Continue to remain stable.  Continue home medications.    Mood disorder in conditions classified elsewhere: Status post shock therapy.  Continue home medications.  Morbid obesity: Patient with poor appetite and weight loss.  Reported that she has lost over 40 pounds since April because of Covid.  Meets criteria for morbid obesity given BMI greater than 40.    Moderate protein calorie malnutrition: In the context of chronic illness which includes her alcoholism as evidenced by mild to moderate fat and muscle depletion.  Seen by nutrition.  Put on Ensure twice daily plus multivitamin.    Rhabdomyolysis: Now resolved.  Secondary to prolonged immobilization on floor prior to admission.  CPK continues to trend downward.    Lumbar nerve root impingement: No significant impingement.  Cleared by neurosurgery.    Bradycardia: Heart rate on admission noted to be 40s to 50s.  Resolved, now 29s to 29s.    Hypokalemia: Replace as needed.  Potassium and magnesium on 12/30 within normal limits    Anxiety: Stable  Alcohol abuse: Updated daughter by phone who informed  us that patient has issues with alcohol abuse.  She was glad that the patient is in the hospital and from here is going to skilled facility which will help keep her away from alcohol.  Patient is out of window for withdrawals  Dysuria complaints: Checked u/a on 12/27 & was clear.   Code Status: Full code  Family Communication: Left message for daughter.  Disposition Plan: Skilled nursing versus inpatient  rehab   Consultants:  Neurosurgery  Physical medicine rehab  Procedures:  None  Antimicrobials:  None  DVT prophylaxis: Lovenox   Objective: Vitals:   02/04/20 0430 02/04/20 0907  BP: 127/71 (!) 109/57  Pulse: 70 74  Resp: 16 16  Temp: 98.2 F (36.8 C) 98.1 F (36.7 C)  SpO2: 94% 94%    Intake/Output Summary (Last 24 hours) at 02/04/2020 1344 Last data filed at 02/04/2020 1230 Gross per 24 hour  Intake 1020 ml  Output 1801 ml  Net -781 ml   Filed Weights   01/31/20 2226 02/01/20 2059 02/02/20 2111  Weight: 112.9 kg 112.5 kg 112.5 kg   Body mass index is 45.36 kg/m.  Exam: Unchanged from previous day  General: Alert and oriented x3, no acute distress  HEENT: Normocephalic and atraumatic, mucous membranes moist  Cardiovascular: Regular rate and rhythm, S1-S2  Respiratory: Clear auscultation bilaterally  Abdomen: Soft, nontender, nondistended, positive bowel sounds  Musculoskeletal: No clubbing or cyanosis or edema  Skin: No skin breaks, tears or lesions  Psychiatry: Appropriate, no evidence of psychoses   Data Reviewed: CBC: Recent Labs  Lab 02/03/20 0040  WBC 6.7  HGB 9.0*  HCT 29.5*  MCV 84.5  PLT 415*   Basic Metabolic Panel: Recent Labs  Lab 02/01/20 0326 02/03/20 0040  NA 139 139  K 3.1* 3.8  CL 105 105  CO2 26 25  GLUCOSE 93 104*  BUN 9 10  CREATININE 0.68 0.64  CALCIUM 7.5* 7.7*  MG  --  2.0   GFR: Estimated Creatinine Clearance: 82 mL/min (by C-G formula based on SCr of 0.64 mg/dL). Liver Function Tests: No results for input(s): AST, ALT, ALKPHOS, BILITOT, PROT, ALBUMIN in the last 168 hours. No results for input(s): LIPASE, AMYLASE in the last 168 hours. No results for input(s): AMMONIA in the last 168 hours. Coagulation Profile: No results for input(s): INR, PROTIME in the last 168 hours. Cardiac Enzymes: No results for input(s): CKTOTAL, CKMB, CKMBINDEX, TROPONINI in the last 168 hours. BNP (last 3  results) No results for input(s): PROBNP in the last 8760 hours. HbA1C: No results for input(s): HGBA1C in the last 72 hours. CBG: No results for input(s): GLUCAP in the last 168 hours. Lipid Profile: No results for input(s): CHOL, HDL, LDLCALC, TRIG, CHOLHDL, LDLDIRECT in the last 72 hours. Thyroid Function Tests: No results for input(s): TSH, T4TOTAL, FREET4, T3FREE, THYROIDAB in the last 72 hours. Anemia Panel: No results for input(s): VITAMINB12, FOLATE, FERRITIN, TIBC, IRON, RETICCTPCT in the last 72 hours. Urine analysis:    Component Value Date/Time   COLORURINE YELLOW 01/31/2020 1520   APPEARANCEUR CLEAR 01/31/2020 1520   LABSPEC 1.009 01/31/2020 1520   PHURINE 7.0 01/31/2020 1520   GLUCOSEU NEGATIVE 01/31/2020 1520   HGBUR NEGATIVE 01/31/2020 1520   BILIRUBINUR NEGATIVE 01/31/2020 1520   BILIRUBINUR neg 03/07/2014 1459   KETONESUR 20 (A) 01/31/2020 1520   PROTEINUR NEGATIVE 01/31/2020 1520   UROBILINOGEN negative 03/07/2014 1459   UROBILINOGEN 0.2 03/04/2013 1710   NITRITE NEGATIVE 01/31/2020 1520   LEUKOCYTESUR NEGATIVE  01/31/2020 1520   Sepsis Labs: @LABRCNTIP (procalcitonin:4,lacticidven:4)  ) No results found for this or any previous visit (from the past 240 hour(s)).    Studies: No results found.  Scheduled Meds: . aspirin EC  81 mg Oral Daily  . busPIRone  20 mg Oral BID  . enoxaparin (LOVENOX) injection  40 mg Subcutaneous Q24H  . FLUoxetine  40 mg Oral Daily  . gabapentin  300 mg Oral QHS  . hydrOXYzine  50 mg Oral TID  . lamoTRIgine  100 mg Oral Daily  . multivitamin with minerals  1 tablet Oral Daily  . Ensure Max Protein  11 oz Oral BID  . QUEtiapine  100 mg Oral QHS  . sodium chloride flush  3 mL Intravenous Q12H    Continuous Infusions:    LOS: 10 days     Annita Brod, MD [facility]   02/04/2020, 1:44 PM

## 2020-02-05 DIAGNOSIS — R531 Weakness: Secondary | ICD-10-CM | POA: Diagnosis not present

## 2020-02-05 DIAGNOSIS — I10 Essential (primary) hypertension: Secondary | ICD-10-CM | POA: Diagnosis not present

## 2020-02-05 DIAGNOSIS — F0630 Mood disorder due to known physiological condition, unspecified: Secondary | ICD-10-CM | POA: Diagnosis not present

## 2020-02-05 DIAGNOSIS — M6282 Rhabdomyolysis: Secondary | ICD-10-CM | POA: Diagnosis not present

## 2020-02-05 NOTE — Plan of Care (Signed)
  Problem: Education: Goal: Knowledge of General Education information will improve Description: Including pain rating scale, medication(s)/side effects and non-pharmacologic comfort measures Outcome: Progressing   Problem: Pain Managment: Goal: General experience of comfort will improve Outcome: Progressing   

## 2020-02-05 NOTE — Plan of Care (Signed)
  Problem: Education: Goal: Knowledge of General Education information will improve Description: Including pain rating scale, medication(s)/side effects and non-pharmacologic comfort measures Outcome: Progressing   Problem: Coping: Goal: Level of anxiety will decrease Outcome: Progressing   Problem: Pain Managment: Goal: General experience of comfort will improve Outcome: Progressing   

## 2020-02-05 NOTE — Progress Notes (Signed)
PROGRESS NOTE  Tamara Mccullough [id number] DOB: 1955/06/03 DOA: 01/25/2020 PCP: Heywood Bene, PA-C  HPI/Recap of past 85 hours: 65 year old female with past medical history significant for hypertension, hypothyroidism, COVID-19 infection this past April and chronic pain who underwent a L5/S1 interbody fusion for foraminal stenosis with radiculopathy on August 31.  Following the procedure, patient states that she went home and was barely able to walk.  While pain had much improved, she was having right-sided numbness from the knee down to her toes as well as feelings of pins-and-needles in her right foot.  Patient had had physical therapy coming to her house.  She also underwent shock therapy for her mood disorder 5 days prior.  The day after she was fine, but then she says 3 days ago she was unable to get up.  She tried to get out of bed and slid to the floor where she lived for 15 hours until paramedics were called.  They were able to get her back to bed, and she did not want to come into the hospital.  The following day, she was try to get up to go the bathroom again felt but was able to call family to call 911 and this time she came to the hospital which was 12/21.  Patient was evaluated and found to have a CK level of 3004.  MRI of the lumbar spine noted recent fusion with regressing noncompressive fluid collection marrow edema and some mild L4-5 right foraminal impingement.  Patient was admitted to the hospital service for rhabdomyolysis.  Patient was evaluated by neurosurgery and after review of MRI, felt that patient's main issues were from deconditioning and weakness as she has had virtually no activity in the last few days.  PT and OT recommending CIR. after discussion with inpatient rehab physician, patient opted for SNF.  PASARR & insurance authorization pending.  No new complaints.  Assessment/Plan: Principal Problem:   Generalized weakness: Needs a lot of physical therapy.   Have added muscle relaxer for back.  Initially cleared by inpatient rehab but she declined for skilled nursing.  Since she has been waiting for approval for skilled nursing, and she will try to go back to inpatient rehab Active Problems:   Essential hypertension, benign: Continue to remain stable.  Continue home medications.    Mood disorder in conditions classified elsewhere: Status post shock therapy.  Continue home medications.  Morbid obesity: Patient with poor appetite and weight loss.  Reported that she has lost over 40 pounds since April because of Covid.  Meets criteria for morbid obesity given BMI greater than 40.    Moderate protein calorie malnutrition: In the context of chronic illness which includes her alcoholism as evidenced by mild to moderate fat and muscle depletion.  Seen by nutrition.  Put on Ensure twice daily plus multivitamin.    Rhabdomyolysis: Now resolved.  Secondary to prolonged immobilization on floor prior to admission.  CPK continues to trend downward.    Lumbar nerve root impingement: No significant impingement.  Cleared by neurosurgery.  Now pain-free.    Bradycardia: Heart rate on admission noted to be 40s to 50s.  Resolved, now 26s to 52s.    Hypokalemia: Replace as needed.  Potassium and magnesium on 12/30 within normal limits    Anxiety: Stable  Alcohol abuse: Updated daughter by phone who informed us that patient has issues with alcohol abuse.  She was glad that the patient is in the hospital and from here is going  to skilled facility which will help keep her away from alcohol.  Patient is out of window for withdrawals  Dysuria complaints: Checked u/a on 12/27 & was clear.   Code Status: Full code  Family Communication: Left message for daughter.  Disposition Plan: Skilled nursing hopefully soon.   Consultants:  Neurosurgery  Physical medicine rehab  Procedures:  None  Antimicrobials:  None  DVT  prophylaxis: Lovenox   Objective: Vitals:   02/05/20 0640 02/05/20 0913  BP: 127/68 133/61  Pulse: 67 69  Resp: 19 14  Temp: 98.1 F (36.7 C) 98.1 F (36.7 C)  SpO2: 97% 96%    Intake/Output Summary (Last 24 hours) at 02/05/2020 1120 Last data filed at 02/05/2020 0900 Gross per 24 hour  Intake 480 ml  Output 1850 ml  Net -1370 ml   Filed Weights   01/31/20 2226 02/01/20 2059 02/02/20 2111  Weight: 112.9 kg 112.5 kg 112.5 kg   Body mass index is 45.36 kg/m.  Exam:   General: Alert and oriented x3, no acute distress  HEENT: Normocephalic and atraumatic, mucous membranes moist  Cardiovascular: Regular rate and rhythm, S1-S2  Respiratory: Clear auscultation bilaterally  Abdomen: Soft, nontender, nondistended, positive bowel sounds  Musculoskeletal: No clubbing or cyanosis or edema  Skin: No skin breaks, tears or lesions  Psychiatry: Appropriate, no evidence of psychoses   Data Reviewed: CBC: Recent Labs  Lab 02/03/20 0040  WBC 6.7  HGB 9.0*  HCT 29.5*  MCV 84.5  PLT [id number]*   Basic Metabolic Panel: Recent Labs  Lab 02/01/20 0326 02/03/20 0040  NA 139 139  K 3.1* 3.8  CL 105 105  CO2 26 25  GLUCOSE 93 104*  BUN 9 10  CREATININE 0.68 0.64  CALCIUM 7.5* 7.7*  MG  --  2.0   GFR: Estimated Creatinine Clearance: 82 mL/min (by C-G formula based on SCr of 0.64 mg/dL). Liver Function Tests: No results for input(s): AST, ALT, ALKPHOS, BILITOT, PROT, ALBUMIN in the last 168 hours. No results for input(s): LIPASE, AMYLASE in the last 168 hours. No results for input(s): AMMONIA in the last 168 hours. Coagulation Profile: No results for input(s): INR, PROTIME in the last 168 hours. Cardiac Enzymes: No results for input(s): CKTOTAL, CKMB, CKMBINDEX, TROPONINI in the last 168 hours. BNP (last 3 results) No results for input(s): PROBNP in the last 8760 hours. HbA1C: No results for input(s): HGBA1C in the last 72 hours. CBG: No results for input(s):  GLUCAP in the last 168 hours. Lipid Profile: No results for input(s): CHOL, HDL, LDLCALC, TRIG, CHOLHDL, LDLDIRECT in the last 72 hours. Thyroid Function Tests: No results for input(s): TSH, T4TOTAL, FREET4, T3FREE, THYROIDAB in the last 72 hours. Anemia Panel: No results for input(s): VITAMINB12, FOLATE, FERRITIN, TIBC, IRON, RETICCTPCT in the last 72 hours. Urine analysis:    Component Value Date/Time   COLORURINE YELLOW 01/31/2020 Stem 01/31/2020 1520   LABSPEC 1.009 01/31/2020 1520   PHURINE 7.0 01/31/2020 1520   GLUCOSEU NEGATIVE 01/31/2020 1520   HGBUR NEGATIVE 01/31/2020 1520   BILIRUBINUR NEGATIVE 01/31/2020 1520   BILIRUBINUR neg 03/07/2014 1459   KETONESUR 20 (A) 01/31/2020 1520   PROTEINUR NEGATIVE 01/31/2020 1520   UROBILINOGEN negative 03/07/2014 1459   UROBILINOGEN 0.2 03/04/2013 1710   NITRITE NEGATIVE 01/31/2020 1520   LEUKOCYTESUR NEGATIVE 01/31/2020 1520   Sepsis Labs: @LABRCNTIP (procalcitonin:4,lacticidven:4)  ) No results found for this or any previous visit (from the past 240 hour(s)).    Studies: No results  found.  Scheduled Meds: . aspirin EC  81 mg Oral Daily  . busPIRone  20 mg Oral BID  . enoxaparin (LOVENOX) injection  40 mg Subcutaneous Q24H  . FLUoxetine  40 mg Oral Daily  . gabapentin  300 mg Oral QHS  . hydrOXYzine  50 mg Oral TID  . lamoTRIgine  100 mg Oral Daily  . multivitamin with minerals  1 tablet Oral Daily  . Ensure Max Protein  11 oz Oral BID  . QUEtiapine  100 mg Oral QHS  . sodium chloride flush  3 mL Intravenous Q12H    Continuous Infusions:    LOS: 11 days     Hollice Espy, MD [facility]   02/05/2020, 11:20 AM

## 2020-02-06 DIAGNOSIS — R531 Weakness: Secondary | ICD-10-CM | POA: Diagnosis not present

## 2020-02-06 NOTE — Progress Notes (Signed)
PROGRESS NOTE    Tamara Mccullough  ZOX:[id number]  DOB: 04-Nov-1955  DOA: 01/25/2020 PCP: Bernita Buffy Outpatient Specialists:   Hospital course: 65 year old female with past medical history significant for hypertension, hypothyroidism, COVID-19 infection this past April and chronic pain who underwent a L5/S1 interbody fusion for foraminal stenosis with radiculopathy on August 31.  Following the procedure, patient states that she went home and was barely able to walk.  While pain had much improved, she was having right-sided numbness from the knee down to her toes as well as feelings of pins-and-needles in her right foot.  Patient had had physical therapy coming to her house.  She also underwent shock therapy for her mood disorder 5 days prior.  The day after she was fine, but then she says 3 days ago she was unable to get up.  She tried to get out of bed and slid to the floor where she lived for 15 hours until paramedics were called.  They were able to get her back to bed, and she did not want to come into the hospital.  The following day, she was try to get up to go the bathroom again felt but was able to call family to call 911 and this time she came to the hospital which was 12/21.  Patient was evaluated and found to have a CK level of 3004.  MRI of the lumbar spine noted recent fusion with regressing noncompressive fluid collection marrow edema and some mild L4-5 right foraminal impingement.  Patient was admitted to the hospital service for rhabdomyolysis.  Patient was evaluated by neurosurgery and after review of MRI, felt that patient's main issues were from deconditioning and weakness as she has had virtually no activity in the last few days.  PT and OT recommending CIR. after discussion with inpatient rehab physician, patient opted for SNF.  PASARR & insurance authorization pending.      Subjective:  Patient states that she would like to go home.  She notes that she  would like to be considered for [facility] or [facility] in [place] but if those do not come through she feels like she will be okay at home with home PT. she states that she has been getting stronger with the help of PT.  Feels that she can go to the bathroom with assistance and she feels like she is getting stronger all the time.  Objective: Vitals:   02/05/20 1638 02/05/20 2107 02/06/20 0517 02/06/20 1008  BP: 136/63 139/75 125/67 131/61  Pulse: 73 77 72 72  Resp: 18 18 15 15   Temp: 98.7 F (37.1 C) 98.5 F (36.9 C) 98.2 F (36.8 C) 97.9 F (36.6 C)  TempSrc: Oral Oral Oral Oral  SpO2: 95% 95% 97% 95%  Weight:      Height:        Intake/Output Summary (Last 24 hours) at 02/06/2020 1748 Last data filed at 02/06/2020 1020 Gross per 24 hour  Intake 0 ml  Output 850 ml  Net -850 ml   Filed Weights   01/31/20 2226 02/01/20 2059 02/02/20 2111  Weight: 112.9 kg 112.5 kg 112.5 kg     Exam:  General: Bright female sitting up in bed in no apparent distress. Eyes: sclera anicteric, conjuctiva mild injection bilaterally CVS: S1-S2, regular  Respiratory:  decreased air entry bilaterally secondary to decreased inspiratory effort, rales at bases  GI: NABS, soft, NT  LE: No edema.  Neuro:  grossly nonfocal.  Psych:  patient is logical and coherent, judgement and insight appear normal, mood and affect appropriate to situation.   Assessment & Plan:   Debilitation Patient states that she has been getting better and does not want a wait here any further for placement unless she is able to go to [facility] or [facility] in [place].  States if those do not come through tomorrow she would like to go home the day after tomorrow. Discussed with her the importance of staying safe and not falling, patient states she understands. Will ask PT to reevaluate the patient to see how safe her plan to go home is.  HTN Reasonably controlled on present medicine  Anxiety and  depression Continue home medications Has had ECT in the past Mood is bright and hopeful at present  Rhabdomyolysis Resolved   DVT prophylaxis: Lovenox Code Status: Full Family Communication: None, she states she will talk with her family Disposition Plan:   Patient is from: Home  Anticipated Discharge Location: Hopefully inpatient rehab, if not patient wants to go home  Barriers to Discharge: Awaiting bed in inpatient rehab  Is patient medically stable for Discharge: Yes   Consultants:  Neurosurgery  Physical medicine rehab  Procedures:  None  Antimicrobials:  None   Data Reviewed:  Basic Metabolic Panel: Recent Labs  Lab 02/01/20 0326 02/03/20 0040  NA 139 139  K 3.1* 3.8  CL 105 105  CO2 26 25  GLUCOSE 93 104*  BUN 9 10  CREATININE 0.68 0.64  CALCIUM 7.5* 7.7*  MG  --  2.0   Liver Function Tests: No results for input(s): AST, ALT, ALKPHOS, BILITOT, PROT, ALBUMIN in the last 168 hours. No results for input(s): LIPASE, AMYLASE in the last 168 hours. No results for input(s): AMMONIA in the last 168 hours. CBC: Recent Labs  Lab 02/03/20 0040  WBC 6.7  HGB 9.0*  HCT 29.5*  MCV 84.5  PLT 415*   Cardiac Enzymes: No results for input(s): CKTOTAL, CKMB, CKMBINDEX, TROPONINI in the last 168 hours. BNP (last 3 results) No results for input(s): PROBNP in the last 8760 hours. CBG: No results for input(s): GLUCAP in the last 168 hours.  No results found for this or any previous visit (from the past 240 hour(s)).    Studies: No results found.   Scheduled Meds: . aspirin EC  81 mg Oral Daily  . busPIRone  20 mg Oral BID  . enoxaparin (LOVENOX) injection  40 mg Subcutaneous Q24H  . FLUoxetine  40 mg Oral Daily  . gabapentin  300 mg Oral QHS  . hydrOXYzine  50 mg Oral TID  . lamoTRIgine  100 mg Oral Daily  . multivitamin with minerals  1 tablet Oral Daily  . Ensure Max Protein  11 oz Oral BID  . QUEtiapine  100 mg Oral QHS  . sodium chloride  flush  3 mL Intravenous Q12H   Continuous Infusions:  Principal Problem:   Generalized weakness Active Problems:   Essential hypertension, benign   Mood disorder in conditions classified elsewhere   Alcohol use disorder, severe, dependence (HCC)   Rhabdomyolysis   Lumbar nerve root impingement   Bradycardia   Hypokalemia   Anxiety   Malnutrition of moderate degree     Mearl Harewood Tublu Quincey Nored, [facility]  If 7PM-7AM, please contact night-coverage [email or web address] Password [email or web address] 02/06/2020, 5:48 PM    LOS: 12 days

## 2020-02-07 LAB — CBC
HCT: 29.8 % — ABNORMAL LOW (ref 36.0–46.0)
Hemoglobin: 9.1 g/dL — ABNORMAL LOW (ref 12.0–15.0)
MCH: 25.9 pg — ABNORMAL LOW (ref 26.0–34.0)
MCHC: 30.5 g/dL (ref 30.0–36.0)
MCV: 84.9 fL (ref 80.0–100.0)
Platelets: 446 10*3/uL — ABNORMAL HIGH (ref 150–400)
RBC: 3.51 MIL/uL — ABNORMAL LOW (ref 3.87–5.11)
RDW: 16.5 % — ABNORMAL HIGH (ref 11.5–15.5)
WBC: 4.8 10*3/uL (ref 4.0–10.5)
nRBC: 0 % (ref 0.0–0.2)

## 2020-02-07 LAB — BASIC METABOLIC PANEL
Anion gap: 10 (ref 5–15)
BUN: 8 mg/dL (ref 8–23)
CO2: 25 mmol/L (ref 22–32)
Calcium: 8.1 mg/dL — ABNORMAL LOW (ref 8.9–10.3)
Chloride: 105 mmol/L (ref 98–111)
Creatinine, Ser: 0.6 mg/dL (ref 0.44–1.00)
GFR, Estimated: >60 mL/min (ref >60)
Glucose, Bld: 96 mg/dL (ref 70–99)
Potassium: 3.8 mmol/L (ref 3.5–5.1)
Sodium: 140 mmol/L (ref 135–145)

## 2020-02-07 MED ORDER — ADULT MULTIVITAMIN W/MINERALS CH: 1.0000 | ORAL_TABLET | Freq: Every day | ORAL | 0 refills | Status: AC

## 2020-02-07 MED ORDER — CYCLOBENZAPRINE HCL 5 MG PO TABS: 5.0000 mg | ORAL_TABLET | Freq: Three times a day (TID) | ORAL | 0 refills | Status: AC | PRN

## 2020-02-07 MED ORDER — ENSURE MAX PROTEIN PO LIQD: 11.0000 [oz_av] | Freq: Two times a day (BID) | ORAL | 0 refills | Status: AC

## 2020-02-07 NOTE — TOC Transition Note (Signed)
Transition of Care [facility]) - CM/SW Discharge Note   Patient Details  Name: Tamara Mccullough MRN: [id number] Date of Birth: 1955-02-21  Transition [facility]) CM/SW Contact:  Bess Kinds, RN Phone Number: [phone] 02/07/2020, 2:30 PM   Clinical Narrative:     Spoke with patient at bedside to discuss progress with plans to transition home today.   Outpatient PT referral sent to [facility] where patient is active with therapy. Patient stated that she needed new referral in order to continue.   Patient requested that hospital bed be sent to [facility] in [place], [place]. Stating she has used them in the past. Spoke with representative at [facility] at [phone] to discuss hospital bed referral. DME order; medical necessity note, and demographics faxed to [phone].   Patient stated that she has wheelchair, walker, and 3N1. She is in the process of getting a ramp.   Patient to reschedule her PCP appointment once she returns home.   No further TOC needs identified.   Final next level of care: OP Rehab Barriers to Discharge: No Barriers Identified   Patient Goals and CMS Choice Patient states their goals for this hospitalization and ongoing recovery are:: go back home stating a neighbor will stay with her CMS Medicare.gov Compare Post Acute Care list provided to:: Patient Choice offered to / list presented to : Patient  Discharge Placement                       Discharge Plan and Services     Post Acute Care Choice: Skilled Nursing Facility          DME Arranged: Hospital bed DME Agency: [facility] Date DME Agency Contacted: 02/07/20 Time DME Agency Contacted: 1430 Representative spoke with at DME Agency: representative at [facility] Arranged: Refused HH HH Agency: NA        Social Determinants of Health (SDOH) Interventions     Readmission Risk Interventions No flowsheet data found.

## 2020-02-07 NOTE — Progress Notes (Signed)
    Durable Medical Equipment  (From admission, onward)         Start     Ordered   02/07/20 1348  For home use only DME Hospital bed  Once       Question Answer Comment  Length of Need 6 Months   Patient has (list medical condition): lumbar root nerve impingement   The above medical condition requires: Patient requires the ability to reposition frequently   Head must be elevated greater than: 30 degrees   Bed type Semi-electric   Support Surface: Gel Overlay      02/07/20 1348

## 2020-02-07 NOTE — TOC Transition Note (Signed)
Transition of Care [facility]) - CM/SW Discharge Note   Patient Details  Name: TASHEEMA PERRONE MRN: [id number] Date of Birth: 1955/11/15  Transition of Care [facility]) CM/SW Contact:  Bess Kinds, RN Phone Number: [phone] 02/07/2020, 1:57 PM   Clinical Narrative:     Spoke with patient at the bedside to discuss transition plans. Patient no longer agreeable to SNF. She stated that her neighbor will stay with her. She wants to go to [facility]. She stated that she has had HH in the past and did not find it helpful. She asked about getting a hospital bed. Referral sent to [facility]. Patient stated that she would like DC medications sent to [facility]. Her friend, Bonita Quin, will provide transportation home as long as she is discharged during daylight hours. PCP verified in Epic as accurate. No further TOC needs identified.   Final next level of care: OP Rehab Barriers to Discharge: No Barriers Identified   Patient Goals and CMS Choice Patient states their goals for this hospitalization and ongoing recovery are:: go back home stating a neighbor will stay with her CMS Medicare.gov Compare Post Acute Care list provided to:: Patient Choice offered to / list presented to : Patient  Discharge Placement                       Discharge Plan and Services     Post Acute Care Choice: Skilled Nursing Facility          DME Arranged: Hospital bed DME Agency: [facility] Date DME Agency Contacted: 02/07/20 Time DME Agency Contacted: 1357 Representative spoke with at DME Agency: Velna Hatchet HH Arranged: Refused HH HH Agency: NA        Social Determinants of Health (SDOH) Interventions     Readmission Risk Interventions No flowsheet data found.

## 2020-02-07 NOTE — Progress Notes (Signed)
Discharge instructions reviewed with pt. Copy of instructions and script given to pt, 1 printed script for MVI given and pt informed other scripts sent to her pharmacy by MD.  Pt d/c'd via wheelchair with belongings, with family/friend at main entrance waiting for pt.           Escorted by unit staff.

## 2020-02-07 NOTE — Progress Notes (Signed)
Physical Therapy Treatment Patient Details Name: Tamara Mccullough MRN: [id number] DOB: Apr 18, 1955 Today's Date: 02/07/2020    History of Present Illness Pt is a 65yo female s/p fall, lying on floor x15 hours. Found to have rhabdo, R sided weakness. MRI lumbar: L4-5 right foraminal impingementPMHx: L5-S1 sx 09/2019. ACDF 11/2013.    PT Comments    Patient received sitting at EOB donning her shirt; cooperative with PT session today but much more resistant than her usual for trying new/novel tasks or cues. Able to progress gait distance considerably, continued practicing stairs however she refused to try navigating steps with RW or even turning around once on the step and going down the stairs forwards. Still with cognitive impairment as evidenced by reduced safety awareness in general, as well as significant difficulty in finding her way back to her hospital room- made wrong turns multiple times and needed cues to use room numbers to find her way back. Left sitting at EOB with all needs met, nursing aware of patient status. Insistent on going home and will definitely need HHPT and 24/7A.     Follow Up Recommendations  Home health PT;Supervision/Assistance - 24 hour     Equipment Recommendations  Rolling walker with 5" wheels;3in1 (PT)    Recommendations for Other Services       Precautions / Restrictions Precautions Precautions: Fall;Other (comment) Precaution Comments: multiple falls in past 6 mos Restrictions Weight Bearing Restrictions: No    Mobility  Bed Mobility               General bed mobility comments: sitting at EOB donning shirt upon entry  Transfers Overall transfer level: Needs assistance Equipment used: Rolling walker (2 wheeled) Transfers: Sit to/from Stand Sit to Stand: Supervision         General transfer comment: S for safety, better awareness of hand placement today and did not need reminder to reach back when sitting back on  bed  Ambulation/Gait Ambulation/Gait assistance: Supervision Gait Distance (Feet): 250 Feet (162fx2) Assistive device: Rolling walker (2 wheeled) Gait Pattern/deviations: Step-through pattern;Decreased step length - right;Decreased step length - left;Decreased stride length;Decreased weight shift to right;Decreased weight shift to left;Trunk flexed;Wide base of support Gait velocity: decreased   General Gait Details: still having trouble clearing feet with sneakers on/still shuffling and scuffing but resistant to cues, stating "look I'm doing the best I can". less fatigued today   Stairs Stairs: Yes Stairs assistance: Min guard Stair Management: Two rails;Forwards;Step to pattern Number of Stairs: 3 (1 step several times) General stair comments: able to navigate single step 3 times with B rails, refuses to try navigating step with RW or even turning around and trying to go down the step forwards like she would at home   Wheelchair Mobility    Modified Rankin (Stroke Patients Only)       Balance Overall balance assessment: Needs assistance;History of Falls Sitting-balance support: Bilateral upper extremity supported;Feet supported Sitting balance-Leahy Scale: Good     Standing balance support: Bilateral upper extremity supported;During functional activity Standing balance-Leahy Scale: Fair Standing balance comment: hx of multiple recent falls, heavy reliance on BUE support                            Cognition Arousal/Alertness: Awake/alert Behavior During Therapy: WFL for tasks assessed/performed;Flat affect;Impulsive Overall Cognitive Status: Impaired/Different from baseline Area of Impairment: Safety/judgement;Problem solving;Awareness  Memory: Decreased short-term memory   Safety/Judgement: Decreased awareness of safety;Decreased awareness of deficits Awareness: Intellectual Problem Solving: Slow processing;Decreased  initiation;Difficulty sequencing;Requires verbal cues General Comments: insistant on going home, more resistant to novel tasks that are not familiar to her today, perseverating on leaving this afternoon. Had a hard time finding her way back to her room- forgot which way she needed to turn multiple times and needed cues to correct course      Exercises      General Comments        Pertinent Vitals/Pain Pain Assessment: 0-10 Pain Score: 2  Pain Location: back pain especially when getting out of bed Pain Descriptors / Indicators: Aching;Sore Pain Intervention(s): Limited activity within patient's tolerance;Monitored during session    Home Living                      Prior Function            PT Goals (current goals can now be found in the care plan section) Acute Rehab PT Goals Patient Stated Goal: go to SNF PT Goal Formulation: With patient Time For Goal Achievement: 02/09/20 Potential to Achieve Goals: Fair Progress towards PT goals: Progressing toward goals    Frequency    Min 3X/week      PT Plan Discharge plan needs to be updated;Equipment recommendations need to be updated    Co-evaluation              AM-PAC PT "6 Clicks" Mobility   Outcome Measure  Help needed turning from your back to your side while in a flat bed without using bedrails?: None Help needed moving from lying on your back to sitting on the side of a flat bed without using bedrails?: A Little Help needed moving to and from a bed to a chair (including a wheelchair)?: A Little Help needed standing up from a chair using your arms (e.g., wheelchair or bedside chair)?: A Little Help needed to walk in hospital room?: A Little Help needed climbing 3-5 steps with a railing? : A Lot 6 Click Score: 18    End of Session Equipment Utilized During Treatment: Gait belt Activity Tolerance: Patient tolerated treatment well Patient left: in bed;with call bell/phone within reach (sitting at  EOB per her request) Nurse Communication: Mobility status PT Visit Diagnosis: Unsteadiness on feet (R26.81);Difficulty in walking, not elsewhere classified (R26.2);Repeated falls (R29.6);Muscle weakness (generalized) (M62.81)     Time: 1450-1500 PT Time Calculation (min) (ACUTE ONLY): 10 min  Charges:  $Gait Training: 8-22 mins                     Windell Norfolk, DPT, PN1   Supplemental Physical Therapist [place]    Pager [phone] Acute Rehab Office [phone]

## 2020-02-07 NOTE — Care Management Important Message (Signed)
Important Message  Patient Details  Name: Tamara Mccullough MRN: [id number] Date of Birth: 10-13-1955   Medicare Important Message Given:  Yes     Oralia Rud Neema Barreira 02/07/2020, 2:55 PM

## 2020-02-08 NOTE — Discharge Summary (Signed)
Tamara Mccullough [id number] DOB: [birth year] DOA: 01/25/2020  PCP: Heywood Bene, PA-C  Admit date: 01/25/2020  Discharge date: 02/08/2020  Admitted From: home   Disposition:  home with outpatient PT   Recommendations for Outpatient Follow-up:   Follow up with PCP in 1-2 weeks  Home Health: PT   Equipment/Devices: Hospital Bed  Consultations: Neurosurgery, physical medicine and rehab Discharge Condition: Improved CODE STATUS: Full Diet Recommendation: Heart Healthy   Diet Order            Diet - low sodium heart healthy                  Chief Complaint  Patient presents with  . Back Pain     Brief history of present illness from the day of admission and additional interim summary     65 year old female with past medical history significant for hypertension, hypothyroidism, COVID-19 infection this past April and chronic pain who underwent a L5/S1 interbody fusion for foraminal stenosis with radiculopathy on August 31. Following the procedure, patient states that she went home and was barely able to walk. While pain had much improved, she was having right-sided numbness from the knee down to her toes as well as feelings of pins-and-needles in her right foot. Patient had had physical therapy coming to her house. She also underwent shock therapy for her mood disorder 5 days prior. The day after she was fine, but then she says 3 days ago she was unable to get up. She tried to get out of bed and slid to the floor where she lived for 15 hours until paramedics were called. They were able to get her back to bed, and she did not want to come into the hospital. The following day, she was try to get up to go the bathroom again felt but was able to call family to call 911 and this time she came to the  hospital which was 12/21. Patient was evaluated and found to have a CK level of 3004. MRI of the lumbar spine noted recent fusion with regressing noncompressive fluid collection marrow edema and some mild L4-5 right foraminal impingement. Patient was admitted to the hospital service for rhabdomyolysis.                                                                 Hospital Course     Patient was evaluated by neurosurgery and after review of MRI, felt that patient's main issues were from deconditioning and weakness as she has had virtually no activity in the last few days. PT and OT recommending CIR. after discussion with inpatient rehab physician, patient opted for SNF.  However patient continued to work with PT in house and by the time of SNF bed was available patient was able to  ambulate up and down the hallway with assistance of walker.  Patient opted to be discharged home with outpatient PT rather than go to a SNF.  Debilitation Patient states that she has been getting better and does not want a wait here any further for placement. She would like to go home the day after tomorrow. Discussed with her the importance of staying safe and not falling, patient states she understands. Patient was seen by PT who noted it was safe for her to go home with outpatient PT as per her preference.  HTN Reasonably controlled on present medicine  Anxiety and depression Continue home medications Has had ECT in the past Mood is bright and hopeful at present  Rhabdomyolysis Resolved   Discharge diagnosis     Principal Problem:   Generalized weakness Active Problems:   Essential hypertension, benign   Mood disorder in conditions classified elsewhere   Alcohol use disorder, severe, dependence (HCC)   Rhabdomyolysis   Lumbar nerve root impingement   Bradycardia   Hypokalemia   Anxiety   Malnutrition of moderate degree    Discharge instructions    Discharge Instructions     Ambulatory referral to Physical Therapy   Complete by: As directed    Diet - low sodium heart healthy   Complete by: As directed    Discharge instructions   Complete by: As directed    You have been referred to Physical Therapy in [place]. Follow up with your PCP in 1-2 weeks Follow up with your back surgeon as recommended.   Increase activity slowly   Complete by: As directed       Discharge Medications   Allergies as of 02/07/2020      Reactions   Codeine Itching, Nausea And Vomiting, Rash, Other (See Comments)   Melatonin Other (See Comments)   seizure   Ambien [zolpidem Tartrate] Other (See Comments)   forgetful   Eszopiclone Rash   Lansoprazole Palpitations   Mirapex [pramipexole] Other (See Comments)   Mouth dry   Motrin [ibuprofen] Nausea And Vomiting   Prilosec [omeprazole] Palpitations, Other (See Comments)   Makes heart beat fast.   Trazodone And Nefazodone Other (See Comments)   fainted      Medication List    STOP taking these medications   butalbital-acetaminophen-caffeine 50-325-40 MG tablet Commonly known as: FIORICET   scopolamine 1 MG/3DAYS Commonly known as: Transderm-Scop (1.5 MG)   tiZANidine 4 MG tablet Commonly known as: Zanaflex     TAKE these medications   acetaminophen 500 MG tablet Commonly known as: TYLENOL Take 1,000 mg by mouth every 4 (four) hours as needed for moderate pain.   ALPRAZolam 0.25 MG tablet Commonly known as: XANAX Take 0.25 mg by mouth at bedtime as needed for sleep.   aspirin EC 81 MG tablet Take 81 mg by mouth daily.   busPIRone 10 MG tablet Commonly known as: BUSPAR Take 2 tablets (20 mg total) by mouth 2 (two) times daily.   cyclobenzaprine 5 MG tablet Commonly known as: FLEXERIL Take 1 tablet (5 mg total) by mouth 3 (three) times daily as needed for muscle spasms. Notes to patient: Last dose at 10:46am today 02/07/2020   diclofenac sodium 1 % Gel Commonly known as: VOLTAREN Apply 4 g topically 4 (four)  times daily. What changed:   when to take this  reasons to take this   Ensure Max Protein Liqd Take 330 mLs (11 oz total) by mouth 2 (two) times daily for 10 days.  FLUoxetine 40 MG capsule Commonly known as: PROZAC Take 1 capsule (40 mg total) by mouth daily.   gabapentin 300 MG capsule Commonly known as: NEURONTIN Take 300 mg by mouth at bedtime.   hydrochlorothiazide 12.5 MG capsule Commonly known as: Microzide Take 1 capsule (12.5 mg total) by mouth daily.   hydrOXYzine 50 MG tablet Commonly known as: ATARAX/VISTARIL Take 1 tablet (50 mg total) by mouth 3 (three) times daily as needed. What changed:   how much to take  when to take this  additional instructions Notes to patient: Last dose was at 10:43am today 02/07/2020   lamoTRIgine 100 MG tablet Commonly known as: LAMICTAL Take 1 tablet (100 mg total) by mouth daily.   multivitamin with minerals Tabs tablet Take 1 tablet by mouth daily.   QUEtiapine 100 MG tablet Commonly known as: SEROQUEL Take 1 tablet (100 mg total) by mouth at bedtime.   traMADol 50 MG tablet Commonly known as: ULTRAM Take 1-2 tablets (50-100 mg total) by mouth every 6 (six) hours as needed for severe pain. What changed: how much to take Notes to patient: Last dose at 10:46am today 02/07/2020   VITAMIN B-12 PO Take 1 tablet by mouth daily.   VITAMIN D PO Take 1 tablet by mouth daily.        Contact information for after-discharge care    Destination    [facility] .   Service: Skilled Nursing Contact information: [address] [place] [address] [phone]                  Major procedures and Radiology Reports - PLEASE review detailed and final reports thoroughly  -      MR Lumbar Spine W Wo Contrast  Result Date: 01/25/2020 CLINICAL DATA:  New lumbar radiculopathy. History of prior surgery. History of L5-S1 fusion 10/05/2019. Fall from bed going to the bathroom with lower back  pain EXAM: MRI LUMBAR SPINE WITHOUT AND WITH CONTRAST TECHNIQUE: Multiplanar and multiecho pulse sequences of the lumbar spine were obtained without and with intravenous contrast. CONTRAST:  89mL GADAVIST GADOBUTROL 1 MMOL/ML IV SOLN COMPARISON:  12/29/2019 FINDINGS: Segmentation:  5 lumbar type vertebrae Alignment:  Physiologic Vertebrae: Recent L5-S1 fusion. No gross change in hardware positioning. The noncompressive fluid collection at the laminectomy defect and posterior to the left facetectomy has regressed. There is still perineural fat effacement at the left more than right L5-S1 foramina, likely perineural granulation tissue. Due to cage subsidence and residual ridging there is still bony foraminal narrowing on both sides. No convincing change in vertebral body marrow signal about the cage which is at least partially related to subsidence seen on 12/16/2019 radiograph. Would expect regression of marrow edema related to standard postoperative changes. There is mild presacral edema which is mildly increased. Remote L4 superior endplate fracture. No overt infection or acute fracture Conus medullaris and cauda equina: Conus extends to the L1-2 level. Conus and cauda equina appear normal. Paraspinal and other soft tissues: Decreased fluid collection as noted above at the level of surgery. There is stable posterior granulation tissue. Gallstone versus volume averaging of the gallbladder wall. Disc levels: Stable residual bony foraminal narrowings at L5-S1, as noted above. L4-5 asymmetric rightward disc collapse and endplate and facet ridging with right L4 foraminal impingement. L3-4 disc bulging and mild degenerative facet spurring without impingement. IMPRESSION: 1. No acute finding. 2. Recent L5-S1 fusion with regressing noncompressive fluid collection. There is still marrow edema about the intervertebral cage which is  likely related to the subsidence noted on a 12/16/2019 radiograph. 3. Unchanged presumed  exuberant granulation tissue around the left foraminal L5 nerve root. 4. L4-5 right foraminal impingement. Electronically Signed   By: Monte Fantasia M.D.   On: 01/25/2020 05:13    Micro Results    No results found for this or any previous visit (from the past 240 hour(s)).  Today   Subjective    Kristana Eberly feels much improved since admission.  Feels ready to go home.  Denies chest pain, shortness of breath or abdominal pain.  Feels they can take care of themselves with the resources they have at home.  Objective   Blood pressure 124/72, pulse 74, temperature 97.8 F (36.6 C), resp. rate 18, height 5\' 2"  (1.575 m), weight 112.5 kg, SpO2 95 %.  No intake or output data in the 24 hours ending 02/08/20 1739  Exam General: Patient appears well and in good spirits sitting up in bed in no acute distress.  Eyes: sclera anicteric, conjuctiva mild injection bilaterally CVS: S1-S2, regular  Respiratory:  decreased air entry bilaterally secondary to decreased inspiratory effort, rales at bases  GI: NABS, soft, NT  LE: No edema.  Neuro:  grossly nonfocal.  Psych: patient is logical and coherent, judgement and insight appear normal, mood and affect appropriate to situation.    Data Review   CBC w Diff:  Lab Results  Component Value Date   WBC 4.8 02/07/2020   HGB 9.1 (L) 02/07/2020   HGB 12.6 04/22/2016   HCT 29.8 (L) 02/07/2020   HCT 38.8 04/22/2016   PLT 446 (H) 02/07/2020   PLT 213 04/22/2016   LYMPHOPCT 6 01/25/2020   MONOPCT 6 01/25/2020   EOSPCT 0 01/25/2020   BASOPCT 0 01/25/2020    CMP:  Lab Results  Component Value Date   NA 140 02/07/2020   NA 143 04/22/2016   K 3.8 02/07/2020   CL 105 02/07/2020   CO2 25 02/07/2020   BUN 8 02/07/2020   BUN 22 04/22/2016   CREATININE 0.60 02/07/2020   CREATININE 0.65 11/07/2016   GLU 113 12/08/2015   PROT 5.2 (L) 01/26/2020   PROT 6.3 04/22/2016   ALBUMIN 2.2 (L) 01/26/2020   ALBUMIN 4.2 04/22/2016   BILITOT 1.0  01/26/2020   BILITOT 0.3 04/22/2016   ALKPHOS 140 (H) 01/26/2020   AST 51 (H) 01/26/2020   ALT 33 01/26/2020  .   Total Time in preparing paper work, data evaluation and todays exam - 35 minutes  Vashti Hey M.D on 02/08/2020 at 5:39 PM  [facility]   Office  [phone]

## 2020-02-10 ENCOUNTER — Ambulatory Visit: Payer: Medicare HMO | Admitting: Physical Therapy

## 2020-02-14 ENCOUNTER — Encounter: Payer: Self-pay | Admitting: Psychiatry

## 2020-02-14 ENCOUNTER — Telehealth (INDEPENDENT_AMBULATORY_CARE_PROVIDER_SITE_OTHER): Payer: Medicare HMO | Admitting: Psychiatry

## 2020-02-14 ENCOUNTER — Other Ambulatory Visit: Payer: Self-pay

## 2020-02-14 DIAGNOSIS — F3341 Major depressive disorder, recurrent, in partial remission: Secondary | ICD-10-CM | POA: Diagnosis not present

## 2020-02-14 DIAGNOSIS — F4310 Post-traumatic stress disorder, unspecified: Secondary | ICD-10-CM | POA: Diagnosis not present

## 2020-02-14 DIAGNOSIS — F0630 Mood disorder due to known physiological condition, unspecified: Secondary | ICD-10-CM

## 2020-02-14 MED ORDER — QUETIAPINE FUMARATE 100 MG PO TABS: 100.0000 mg | ORAL_TABLET | Freq: Every day | ORAL | 0 refills | Status: AC

## 2020-02-14 MED ORDER — HYDROXYZINE HCL 50 MG PO TABS: 50.0000 mg | ORAL_TABLET | Freq: Three times a day (TID) | ORAL | 0 refills | Status: AC | PRN

## 2020-02-14 MED ORDER — FLUOXETINE HCL 40 MG PO CAPS
40.0000 mg | ORAL_CAPSULE | Freq: Every day | ORAL | 0 refills | Status: AC
Start: 2020-02-22 — End: ?

## 2020-02-14 MED ORDER — BUSPIRONE HCL 10 MG PO TABS: 20.0000 mg | ORAL_TABLET | Freq: Two times a day (BID) | ORAL | 0 refills | Status: AC

## 2020-02-14 MED ORDER — LAMOTRIGINE 100 MG PO TABS
100.0000 mg | ORAL_TABLET | Freq: Every day | ORAL | 0 refills | Status: AC
Start: 2020-02-22 — End: ?
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
## 2020-02-14 NOTE — Patient Instructions (Signed)
1.Continuefluoxetine40 mg daily 2.Continue Buspar20 mg BID 3.Continuequetiapine100mg  at night 4.Continuelamotrigine 100 mg daily 5Continuehydroxyzine50 mgtwo to three timesa dayas needed for anxiety 6.Next appointment:4/11 at 1 PM

## 2020-02-17 ENCOUNTER — Other Ambulatory Visit: Payer: Self-pay

## 2020-02-17 ENCOUNTER — Ambulatory Visit: Payer: Medicare HMO | Attending: Internal Medicine | Admitting: Physical Therapy

## 2020-02-17 ENCOUNTER — Encounter: Payer: Self-pay | Admitting: Physical Therapy

## 2020-02-17 DIAGNOSIS — R2681 Unsteadiness on feet: Secondary | ICD-10-CM | POA: Diagnosis present

## 2020-02-17 DIAGNOSIS — M6281 Muscle weakness (generalized): Secondary | ICD-10-CM | POA: Insufficient documentation

## 2020-02-17 NOTE — Therapy (Signed)
[facility] [address] [place], [place], [address] Phone: [phone]   Fax:  [phone]  Physical Therapy Evaluation  Patient Details  Name: Tamara Mccullough MRN: [id number] Date of Birth: April 16, 1955 Referring Provider (PT): Pieter Partridge, MD   Encounter Date: 02/17/2020   PT End of Session - 02/17/20 1808    Visit Number 1    Number of Visits 12    Date for PT Re-Evaluation 04/06/20    Authorization Type [employer] Medicare (CQ modifier, KX modifier) Progress note every 10th visit    PT Start Time 1345    PT Stop Time 1428    PT Time Calculation (min) 43 min    Equipment Utilized During Treatment --   rollator   Activity Tolerance Patient tolerated treatment well    Behavior During Therapy [facility] for tasks assessed/performed           Past Medical History:  Diagnosis Date  . Allergy   . Anemia   . Arthritis    back   . Asthma    h/o  . Bipolar disorder (HCC)   . Chronic back pain   . Depression   . Depression   . Dyspnea    on exertion  . Edema   . Hyperlipidemia   . Hypertension   . Insomnia   . Morbid obesity (HCC)   . Neuromuscular disorder (HCC)   . Overdose of sleeping tabs 03/10/2013  . Panic attacks   . Pneumonia    h/o  . PONV (postoperative nausea and vomiting)   . Restless leg syndrome   . Schizophrenia (HCC)   . Seizures (HCC)    hx of seizure 05/2013; last seizure April 2019  . Sleep apnea    has not used CPAP in 3 years- not using CPAP  . Thyroid disease     Past Surgical History:  Procedure Laterality Date  . ANTERIOR CERVICAL DECOMP/DISCECTOMY FUSION N/A 11/09/2013   Procedure:  Anterior cervical decompression/diskectomy/fusion cervical four-five ,cervical five-six,cervical six-seven;  Surgeon: Karn Cassis, MD;  Location: [facility];  Service: Neurosurgery;  Laterality: N/A;  . BACK SURGERY    . COLONOSCOPY  2011  . CTR    . gastric by pass  6/11  . MINOR HEMORRHOIDECTOMY     . SPINE SURGERY    . THYROID LOBECTOMY Right 08/12/2013   Procedure: RIGHT THYROID LOBECTOMY;  Surgeon: Adolph Pollack, MD;  Location: [facility];  Service: General;  Laterality: Right;  . TUBAL LIGATION      There were no vitals filed for this visit.    Subjective Assessment - 02/17/20 1811    Subjective COVID-19 screening performed upon arrival. Patient arrives to physical therapy with reports of a history of falls, bilateral LE weakness, difficulties performing ADLs and walking secondary a hospitalization following a fall on 01/25/20. Patient reported staying in the hospital for 16 days secondary to weakness. Patient has since been discharged but has not received any home health PT. Patient reports difficulties with home activities and walking with her rollator around her home. Patient requires assistance for negotiating steps to enter her house. Patient is limited with standing activities secondary to weakness and chronic history of low back pain. Patient's goals are to improve walking and to walk without an AD, have less difficulties with ADLs, and improve strength.    Pertinent History "5 back surgeries", HTN, thyroid surgery, CTR, ACDF, bipolar depression, schizophrenia, history of seizures    Limitations Walking;House hold  activities    How long can you stand comfortably? short periods    How long can you walk comfortably? around home with rollator    Patient Stated Goals walk without AD    Currently in Pain? Yes    Pain Score 7     Pain Location Back    Pain Orientation Lower    Pain Descriptors / Indicators Discomfort    Pain Type Chronic pain    Pain Onset More than a month ago    Pain Frequency Constant    Aggravating Factors  movement, taking a shower    Pain Relieving Factors medication, resting    Effect of Pain on Daily Activities hard to perform everyday activities due to pain and weakness              [facility] PT Assessment - 02/17/20 0001      Assessment   Medical  Diagnosis Generalized Weakness    Referring Provider (PT) Dewaine Oats Derek Jack, MD    Onset Date/Surgical Date 01/25/20    Next MD Visit n/a    Prior Therapy in hospital      Precautions   Precautions Fall      Restrictions   Weight Bearing Restrictions No      Balance Screen   Has the patient fallen in the past 6 months Yes    How many times? 2    Has the patient had a decrease in activity level because of a fear of falling?  Yes    Is the patient reluctant to leave their home because of a fear of falling?  Yes      [place] residence    Living Arrangements Alone    Type of [place] Access Stairs to enter    Entrance Stairs-Number of Steps 3-4    Entrance Stairs-Rails Left    Home Layout One level      Prior Function   Level of Independence Independent with basic ADLs      Posture/Postural Control   Postural Limitations Rounded Shoulders;Forward head;Flexed trunk      ROM / Strength   AROM / PROM / Strength Strength      Strength   Strength Assessment Site Hip;Knee;Ankle    Right/Left Hip Right;Left    Right Hip Flexion 3-/5    Left Hip Flexion 3-/5    Right/Left Knee Right;Left    Right Knee Flexion 3+/5    Right Knee Extension 3+/5    Left Knee Flexion 3+/5    Left Knee Extension 3+/5    Right/Left Ankle Right;Left    Right Ankle Dorsiflexion 3+/5    Right Ankle Plantar Flexion 3/5    Right Ankle Inversion 3+/5    Right Ankle Eversion 3+/5    Left Ankle Dorsiflexion 3+/5    Left Ankle Plantar Flexion 3/5    Left Ankle Inversion 3+/5    Left Ankle Eversion 3+/5      Ambulation/Gait   Assistive device Rollator    Gait Pattern Step-to pattern;Decreased stride length;Right foot flat;Left foot flat;Shuffle;Trunk flexed;Poor foot clearance - left;Poor foot clearance - right      Standardized Balance Assessment   Standardized Balance Assessment Five Times Sit to Stand    Five times sit to stand comments  29.0  seconds with UE support in standard chair  Objective measurements completed on examination: See above findings.               PT Education - 02/17/20 2044    Education Details seated: HR/TR, LAQ, hip abd with yellow band, marching    Person(s) Educated Patient    Methods Explanation;Demonstration;Handout    Comprehension Verbalized understanding               PT Long Term Goals - 02/17/20 2029      PT LONG TERM GOAL #1   Title Patient will be independent with HEP and its progression.    Time 6    Period Weeks    Status New      PT LONG TERM GOAL #2   Title Patient will perform modified 5x sit to stand test in 25 seconds or less with UE support to improve balance and improve functional LE strength.    Time 6    Period Weeks    Status New      PT LONG TERM GOAL #3   Title Patient will demonstrate 4/5 or greater bilateral LE MMT to improve stability during functional tasks.    Time 6    Period Weeks    Status New      PT LONG TERM GOAL #4   Title Patient will negotiate steps with step to step pattern with one railing and distant supervision to safely enter and exit her home.    Time 6    Period Weeks    Status New      PT LONG TERM GOAL #5   Title Patient will report ability to stand for 5 minutes or greater with UE support to safely perform home activities and ADLs.    Time 6    Period Weeks    Status New                  Plan - 02/17/20 2033    Clinical Impression Statement Patient is a 65 year old female who presents to physical therapy with bilateral LE weakness and decreased balance secondary to a hospitalization due to a fall on 01/25/2020. Patient's modified 5x sit to stand time of 29.0 seconds categorizes her as a fall risk with decreased LE strength. Patient has difficulties with maintaining muscular contractions while performing MMT. Patient has difficulties with attaining positions for Romberg but is  able to maintain position for at least 30 seconds. Patient ambulates with a rollator with decreased stride lengths, poor foot clearance, and decreased heel strike. Patient and PT discussed plan of care and discussed HEP to which she reported understanding. Patient would benefit from skilled physical therapy to address deficits and address patient's goals.    Personal Factors and Comorbidities Comorbidity 1;Comorbidity 2    Comorbidities "5 back surgeries", HTN, thyroid surgery, CTR, ACDF, bipolar, schizophrenia, history of seizures    Examination-Activity Limitations Other;Transfers;Locomotion Level;Stairs;Stand;Hygiene/Grooming;Dressing;Squat;Bend    Examination-Participation Restrictions Cleaning;Meal Prep    Stability/Clinical Decision Making Evolving/Moderate complexity    Clinical Decision Making Moderate    Rehab Potential Fair    PT Frequency 2x / week    PT Duration 6 weeks    PT Treatment/Interventions ADLs/Self Care Home Management;Moist Heat;Gait training;Stair training;Functional mobility training;Therapeutic activities;Therapeutic exercise;Balance training;Neuromuscular re-education;Manual techniques;Patient/family education;Passive range of motion;Cryotherapy;Electrical Stimulation    PT Next Visit Plan nustep, LE strengthening, core stabilization, balance activities in sitting and standing.    PT Home Exercise Plan see patient education section    Consulted and Agree with Plan  of Care Patient           Patient will benefit from skilled therapeutic intervention in order to improve the following deficits and impairments:  Pain,Postural dysfunction,Decreased activity tolerance,Decreased strength,Decreased mobility,Abnormal gait,Decreased coordination,Decreased range of motion,Difficulty walking,Decreased balance  Visit Diagnosis: Muscle weakness (generalized) - Plan: PT plan of care cert/re-cert  Unsteadiness on feet - Plan: PT plan of care cert/re-cert     Problem  List Patient Active Problem List   Diagnosis Date Noted  . Malnutrition of moderate degree 01/28/2020  . Generalized weakness 01/26/2020  . Rhabdomyolysis 01/25/2020  . Lumbar nerve root impingement 01/25/2020  . Bradycardia 01/25/2020  . Hypokalemia 01/25/2020  . Anxiety 01/25/2020  . PTSD (post-traumatic stress disorder) 10/14/2017  . Seizure ([place]) 12/13/2016  . Lumbar disc disorder 11/07/2016  . Gastric bypass status for obesity 08/22/2016  . Seizures ([place]) 09/01/2015  . Lumbar foraminal stenosis 07/17/2015  . OA (osteoarthritis) of knee 07/17/2015  . Alcohol use disorder, severe, dependence ([place])   . Insomnia due to anxiety and fear 12/20/2013  . Cervical stenosis of spinal canal 11/09/2013  . RLS (restless legs syndrome) 08/09/2013  . Multinodular goiter 06/09/2013  . Alcoholism in remission ([place]) 03/15/2013  . Essential hypertension, benign 05/20/2012  . GAD (generalized anxiety disorder) 05/20/2012  . Hyperlipidemia with target LDL less than 100 05/20/2012  . Mood disorder in conditions classified elsewhere 05/20/2012  . Anemia, vitamin B12 deficiency 07/06/2009  . Obesity 06/29/2009  . PERSONAL HX COLONIC POLYPS 06/29/2009    Gabriela Eves, PT, DPT 02/17/2020, 8:47 PM  [facility] [address] [place], [place], [address] Phone: [phone]   Fax:  [phone]  Name: Tamara Mccullough MRN: [id number] Date of Birth: 1955-08-08

## 2020-02-18 ENCOUNTER — Telehealth: Payer: Self-pay

## 2020-02-18 NOTE — Telephone Encounter (Signed)
Medication management - Prior Authorization for patient's continued use of Hydroxyzine completed online with CoverMyMeds.  Pending approval from [facility]

## 2020-02-21 NOTE — Telephone Encounter (Signed)
received notice that hydroxyzine hcl was approved from 02-05-20 to 02-03-21

## 2020-02-22 ENCOUNTER — Ambulatory Visit: Payer: Medicare HMO | Admitting: Physical Therapy

## 2020-02-24 ENCOUNTER — Ambulatory Visit: Payer: Medicare HMO | Admitting: Physical Therapy

## 2020-02-29 ENCOUNTER — Ambulatory Visit: Payer: Medicare HMO | Admitting: Physical Therapy

## 2020-03-02 ENCOUNTER — Encounter: Payer: Medicare HMO | Admitting: Physical Therapy

## 2020-03-07 ENCOUNTER — Ambulatory Visit: Payer: Medicare HMO | Admitting: Physical Therapy

## 2020-03-09 ENCOUNTER — Encounter: Payer: Medicare HMO | Admitting: Physical Therapy

## 2020-03-16 ENCOUNTER — Other Ambulatory Visit: Payer: Self-pay | Admitting: Neurosurgery

## 2020-03-16 DIAGNOSIS — M48061 Spinal stenosis, lumbar region without neurogenic claudication: Secondary | ICD-10-CM

## 2020-03-20 ENCOUNTER — Other Ambulatory Visit: Payer: Medicare HMO

## 2020-03-25 ENCOUNTER — Ambulatory Visit
Admission: RE | Admit: 2020-03-25 | Discharge: 2020-03-25 | Disposition: A | Discharge status: Home / Self Care | Payer: Medicare HMO | Source: Ambulatory Visit | Attending: Neurosurgery | Admitting: Neurosurgery

## 2020-03-25 ENCOUNTER — Other Ambulatory Visit: Payer: Self-pay

## 2020-03-25 DIAGNOSIS — M48061 Spinal stenosis, lumbar region without neurogenic claudication: Secondary | ICD-10-CM

## 2020-04-03 ENCOUNTER — Other Ambulatory Visit: Payer: Medicare HMO

## 2020-04-06 ENCOUNTER — Other Ambulatory Visit: Payer: Self-pay | Admitting: Neurosurgery

## 2020-04-06 ENCOUNTER — Other Ambulatory Visit (HOSPITAL_COMMUNITY): Payer: Self-pay | Admitting: Neurosurgery

## 2020-04-06 DIAGNOSIS — S32009K Unspecified fracture of unspecified lumbar vertebra, subsequent encounter for fracture with nonunion: Secondary | ICD-10-CM

## 2020-04-07 ENCOUNTER — Encounter (HOSPITAL_COMMUNITY): Payer: Self-pay

## 2020-04-07 ENCOUNTER — Ambulatory Visit (HOSPITAL_COMMUNITY)
Admission: RE | Admit: 2020-04-07 | Discharge: 2020-04-07 | Disposition: A | Discharge status: Home / Self Care | Payer: Medicare HMO | Source: Ambulatory Visit | Attending: Neurosurgery | Admitting: Neurosurgery

## 2020-04-07 ENCOUNTER — Other Ambulatory Visit: Payer: Self-pay

## 2020-04-07 DIAGNOSIS — Y838 Other surgical procedures as the cause of abnormal reaction of the patient, or of later complication, without mention of misadventure at the time of the procedure: Secondary | ICD-10-CM | POA: Diagnosis not present

## 2020-04-07 DIAGNOSIS — Y793 Surgical instruments, materials and orthopedic devices (including sutures) associated with adverse incidents: Secondary | ICD-10-CM | POA: Insufficient documentation

## 2020-04-07 DIAGNOSIS — T84226A Displacement of internal fixation device of vertebrae, initial encounter: Secondary | ICD-10-CM | POA: Diagnosis not present

## 2020-04-07 DIAGNOSIS — S32009K Unspecified fracture of unspecified lumbar vertebra, subsequent encounter for fracture with nonunion: Secondary | ICD-10-CM | POA: Diagnosis not present

## 2020-04-11 ENCOUNTER — Other Ambulatory Visit: Payer: Self-pay | Admitting: Neurosurgery

## 2020-04-18 NOTE — Progress Notes (Signed)
Surgical Instructions   Your procedure is scheduled on Friday, March 18th.  Report to [facility] Main Entrance "A" at 6:00 A.M., then check in with the Admitting office.  Call this number if you have problems the morning of surgery:  [phone]   If you have any questions prior to your surgery date call [phone]: Open Monday-Friday 8am-4pm   Remember:  Do not eat or drink after midnight the night before your surgery    Take these medicines the morning of surgery with A SIP OF WATER  busPIRone (BUSPAR)  FLUoxetine (PROZAC)  lamoTRIgine (LAMICTAL)  If needed: acetaminophen (TYLENOL), ALPRAZolam (XANAX), cyclobenzaprine (FLEXERIL), hydrOXYzine (ATARAX/VISTARIL), oxyCODONE-acetaminophen (PERCOCET/ROXICET)      Follow your surgeon's instructions on when to stop Aspirin.  If no instructions were given by your surgeon then you will need to call the office to get those instructions.    As of today, STOP using diclofenac sodium (VOLTAREN), STOP taking Aleve, Naproxen, Ibuprofen, Motrin, Advil, Goody's, BC's, all herbal medications, fish oil, and all vitamins.                     Do not wear jewelry, make up, or nail polish            Do not wear lotions, powders, perfumes, or deodorant.            Do not shave 48 hours prior to surgery.              Do not bring valuables to the hospital.            [facility] is not responsible for any belongings or valuables.  Do NOT Smoke (Tobacco/Vaping) or drink Alcohol 24 hours prior to your procedure If you use a CPAP at night, you may bring all equipment for your overnight stay.   Contacts, glasses, dentures or bridgework may not be worn into surgery, please bring cases for these belongings   For patients admitted to the hospital, discharge time will be determined by your treatment team.   Patients discharged the day of surgery will not be allowed to drive home, and someone needs to stay with them for 24 hours.  Special instructions:    [facility]- Preparing For Surgery  Before surgery, you can play an important role. Because skin is not sterile, your skin needs to be as free of germs as possible. You can reduce the number of germs on your skin by washing with CHG (chlorahexidine gluconate) Soap before surgery.  CHG is an antiseptic cleaner which kills germs and bonds with the skin to continue killing germs even after washing.    Oral Hygiene is also important to reduce your risk of infection.  Remember - BRUSH YOUR TEETH THE MORNING OF SURGERY WITH YOUR REGULAR TOOTHPASTE  Please do not use if you have an allergy to CHG or antibacterial soaps. If your skin becomes reddened/irritated stop using the CHG.  Do not shave (including legs and underarms) for at least 48 hours prior to first CHG shower. It is OK to shave your face.  Please follow these instructions carefully.   1. Shower the NIGHT BEFORE SURGERY and the MORNING OF SURGERY  2. If you chose to wash your hair, wash your hair first as usual with your normal shampoo. After you shampoo, rinse your hair and body thoroughly to remove the shampoo.  3. Wash Face and genitals (private parts) with your normal soap.   4. Use CHG Soap as you  would any other liquid soap. You can apply CHG directly to the skin and wash gently with a scrungie or a clean washcloth.   5. Apply the CHG Soap to your body ONLY FROM THE NECK DOWN.  Do not use on open wounds or open sores. Avoid contact with your eyes, ears, mouth and genitals (private parts). Wash Face and genitals (private parts)  with your normal soap.   6. Wash thoroughly, paying special attention to the area where your surgery will be performed.  7. Thoroughly rinse your body with warm water from the neck down.  8. DO NOT shower/wash with your normal soap after using and rinsing off the CHG Soap.  9. Pat yourself dry with a CLEAN TOWEL.  10. Wear CLEAN PAJAMAS to bed the night before surgery  11. Place CLEAN SHEETS on your  bed the night before your surgery  12. DO NOT SLEEP WITH PETS.  Day of Surgery: Shower with CHG soap Wear Clean/Comfortable clothing the morning of surgery Do not apply any deodorants/lotions.   Remember to brush your teeth WITH YOUR REGULAR TOOTHPASTE.   Please read over the following fact sheets that you were given.

## 2020-04-19 ENCOUNTER — Encounter (HOSPITAL_COMMUNITY): Payer: Self-pay

## 2020-04-19 ENCOUNTER — Other Ambulatory Visit: Payer: Self-pay

## 2020-04-19 ENCOUNTER — Encounter (HOSPITAL_COMMUNITY)
Admission: RE | Admit: 2020-04-19 | Discharge: 2020-04-19 | Disposition: A | Discharge status: Home / Self Care | Payer: Medicare HMO | Source: Ambulatory Visit | Attending: Neurosurgery | Admitting: Neurosurgery

## 2020-04-19 DIAGNOSIS — Z20822 Contact with and (suspected) exposure to covid-19: Secondary | ICD-10-CM | POA: Insufficient documentation

## 2020-04-19 DIAGNOSIS — Z01812 Encounter for preprocedural laboratory examination: Secondary | ICD-10-CM | POA: Insufficient documentation

## 2020-04-19 LAB — BASIC METABOLIC PANEL
Anion gap: 9 (ref 5–15)
BUN: 13 mg/dL (ref 8–23)
CO2: 23 mmol/L (ref 22–32)
Calcium: 8.2 mg/dL — ABNORMAL LOW (ref 8.9–10.3)
Chloride: 106 mmol/L (ref 98–111)
Creatinine, Ser: 0.78 mg/dL (ref 0.44–1.00)
GFR, Estimated: >60 mL/min (ref >60)
Glucose, Bld: 96 mg/dL (ref 70–99)
Potassium: 3.4 mmol/L — ABNORMAL LOW (ref 3.5–5.1)
Sodium: 138 mmol/L (ref 135–145)

## 2020-04-19 LAB — CBC WITH DIFFERENTIAL/PLATELET
Abs Immature Granulocytes: 0.02 10*3/uL (ref 0.00–0.07)
Basophils Absolute: 0.1 10*3/uL (ref 0.0–0.1)
Basophils Relative: 1 %
Eosinophils Absolute: 0.2 10*3/uL (ref 0.0–0.5)
Eosinophils Relative: 3 %
HCT: 43.4 % (ref 36.0–46.0)
Hemoglobin: 13.5 g/dL (ref 12.0–15.0)
Immature Granulocytes: 0 %
Lymphocytes Relative: 33 %
Lymphs Abs: 2.2 10*3/uL (ref 0.7–4.0)
MCH: 26 pg (ref 26.0–34.0)
MCHC: 31.1 g/dL (ref 30.0–36.0)
MCV: 83.5 fL (ref 80.0–100.0)
Monocytes Absolute: 0.4 10*3/uL (ref 0.1–1.0)
Monocytes Relative: 6 %
Neutro Abs: 3.8 10*3/uL (ref 1.7–7.7)
Neutrophils Relative %: 57 %
Platelets: 264 10*3/uL (ref 150–400)
RBC: 5.2 MIL/uL — ABNORMAL HIGH (ref 3.87–5.11)
RDW: 16.1 % — ABNORMAL HIGH (ref 11.5–15.5)
WBC: 6.7 10*3/uL (ref 4.0–10.5)
nRBC: 0 % (ref 0.0–0.2)

## 2020-04-19 LAB — SURGICAL PCR SCREEN
MRSA, PCR: NEGATIVE
Staphylococcus aureus: POSITIVE — AB

## 2020-04-19 LAB — SARS CORONAVIRUS 2 (TAT 6-24 HRS): SARS Coronavirus 2: NEGATIVE

## 2020-04-19 NOTE — Progress Notes (Signed)
PCP - Clyde Lundborg Cardiologist - denies  PPM/ICD - denies   Chest x-ray - n/a EKG - 01/25/20 Stress Test - over 10 years ago, doesn't remember MD name who ordered it ECHO - 2008 Cardiac Cath - denies  Sleep Study - yes, over 10 years ago (doesn't remember who or where) CPAP - yes, does not wear and has not worn for several years   Follow your surgeon's instructions on when to stop Aspirin.  If no instructions were given by your surgeon then you will need to call the office to get those instructions.     ERAS Protcol -no   COVID TEST- 04/19/20   Anesthesia review: yes, copd. Pt has sleep apnea but has not worn her cpap in several years. Does not follow a pulmonologist, just her pcp.   Patient denies shortness of breath, fever, cough and chest pain at PAT appointment   All instructions explained to the patient, with a verbal understanding of the material. Patient agrees to go over the instructions while at home for a better understanding. Patient also instructed to self quarantine after being tested for COVID-19. The opportunity to ask questions was provided.

## 2020-04-20 NOTE — Anesthesia Preprocedure Evaluation (Addendum)
Anesthesia Evaluation  Patient identified by MRN, date of birth, ID band Patient awake    Reviewed: Allergy & Precautions, NPO status , Patient's Chart, lab work & pertinent test results  History of Anesthesia Complications (+) PONV and history of anesthetic complications  Airway Mallampati: II  TM Distance: >3 FB Neck ROM: Full    Dental  (+) Dental Advisory Given, Partial Upper   Pulmonary asthma , sleep apnea , former smoker,    Pulmonary exam normal        Cardiovascular hypertension, Pt. on medications Normal cardiovascular exam  ECG: SR, rate 74   Neuro/Psych Seizures -, Well Controlled,  PSYCHIATRIC DISORDERS Anxiety Depression Bipolar Disorder Schizophrenia Last 3 mos ago    GI/Hepatic negative GI ROS, Neg liver ROS,   Endo/Other  Morbid obesity  Renal/GU negative Renal ROS     Musculoskeletal  (+) Arthritis , Chronic back pain Restless leg syndrome   Abdominal   Peds  Hematology negative hematology ROS (+)   Anesthesia Other Findings Lumbar Stenosis  Reproductive/Obstetrics                           Anesthesia Physical  Anesthesia Plan  ASA: III  Anesthesia Plan: General   Post-op Pain Management:    Induction: Intravenous  PONV Risk Score and Plan: 4 or greater and Midazolam, Ondansetron, Aprepitant, TIVA and Scopolamine patch - Pre-op  Airway Management Planned: Oral ETT  Additional Equipment:   Intra-op Plan:   Post-operative Plan: Extubation in OR  Informed Consent: I have reviewed the patients History and Physical, chart, labs and discussed the procedure including the risks, benefits and alternatives for the proposed anesthesia with the patient or authorized representative who has indicated his/her understanding and acceptance.     Dental advisory given  Plan Discussed with: Anesthesiologist and CRNA  Anesthesia Plan Comments:        Anesthesia  Quick Evaluation

## 2020-04-21 ENCOUNTER — Inpatient Hospital Stay (HOSPITAL_COMMUNITY): Payer: Medicare HMO

## 2020-04-21 ENCOUNTER — Encounter (HOSPITAL_COMMUNITY): Payer: Self-pay | Admitting: Neurosurgery

## 2020-04-21 ENCOUNTER — Inpatient Hospital Stay (HOSPITAL_COMMUNITY)
Admission: RE | Admit: 2020-04-21 | Discharge: 2020-04-25 | DRG: 460 | Disposition: A | Discharge status: Home / Self Care | Payer: Medicare HMO | Attending: Neurosurgery | Admitting: Neurosurgery

## 2020-04-21 ENCOUNTER — Encounter (HOSPITAL_COMMUNITY)
Admission: RE | Disposition: A | Discharge status: Home / Self Care | Payer: Self-pay | Source: Home / Self Care | Attending: Neurosurgery

## 2020-04-21 ENCOUNTER — Inpatient Hospital Stay (HOSPITAL_COMMUNITY): Payer: Medicare HMO | Admitting: Anesthesiology

## 2020-04-21 ENCOUNTER — Inpatient Hospital Stay (HOSPITAL_COMMUNITY): Payer: Medicare HMO | Admitting: Physician Assistant

## 2020-04-21 ENCOUNTER — Other Ambulatory Visit: Payer: Self-pay

## 2020-04-21 DIAGNOSIS — M960 Pseudarthrosis after fusion or arthrodesis: Secondary | ICD-10-CM | POA: Diagnosis present

## 2020-04-21 DIAGNOSIS — R710 Precipitous drop in hematocrit: Secondary | ICD-10-CM | POA: Diagnosis not present

## 2020-04-21 DIAGNOSIS — Z87891 Personal history of nicotine dependence: Secondary | ICD-10-CM | POA: Diagnosis not present

## 2020-04-21 DIAGNOSIS — S32009K Unspecified fracture of unspecified lumbar vertebra, subsequent encounter for fracture with nonunion: Principal | ICD-10-CM | POA: Diagnosis present

## 2020-04-21 DIAGNOSIS — J45909 Unspecified asthma, uncomplicated: Secondary | ICD-10-CM | POA: Diagnosis present

## 2020-04-21 DIAGNOSIS — Z79899 Other long term (current) drug therapy: Secondary | ICD-10-CM | POA: Diagnosis not present

## 2020-04-21 DIAGNOSIS — I10 Essential (primary) hypertension: Secondary | ICD-10-CM | POA: Diagnosis present

## 2020-04-21 DIAGNOSIS — M9689 Other intraoperative and postprocedural complications and disorders of the musculoskeletal system: Secondary | ICD-10-CM

## 2020-04-21 DIAGNOSIS — Z833 Family history of diabetes mellitus: Secondary | ICD-10-CM

## 2020-04-21 DIAGNOSIS — I959 Hypotension, unspecified: Secondary | ICD-10-CM | POA: Diagnosis not present

## 2020-04-21 DIAGNOSIS — M4317 Spondylolisthesis, lumbosacral region: Secondary | ICD-10-CM | POA: Diagnosis present

## 2020-04-21 DIAGNOSIS — M48061 Spinal stenosis, lumbar region without neurogenic claudication: Secondary | ICD-10-CM | POA: Diagnosis present

## 2020-04-21 DIAGNOSIS — Z885 Allergy status to narcotic agent status: Secondary | ICD-10-CM | POA: Diagnosis not present

## 2020-04-21 DIAGNOSIS — Z7982 Long term (current) use of aspirin: Secondary | ICD-10-CM

## 2020-04-21 DIAGNOSIS — G4730 Sleep apnea, unspecified: Secondary | ICD-10-CM | POA: Diagnosis present

## 2020-04-21 DIAGNOSIS — Z20822 Contact with and (suspected) exposure to covid-19: Secondary | ICD-10-CM | POA: Diagnosis present

## 2020-04-21 DIAGNOSIS — M5416 Radiculopathy, lumbar region: Secondary | ICD-10-CM | POA: Diagnosis present

## 2020-04-21 DIAGNOSIS — Z888 Allergy status to other drugs, medicaments and biological substances status: Secondary | ICD-10-CM | POA: Diagnosis not present

## 2020-04-21 HISTORY — PX: LAMINECTOMY WITH POSTERIOR LATERAL ARTHRODESIS LEVEL 2: SHX6336

## 2020-04-21 HISTORY — PX: AORTOGRAM: SHX6300

## 2020-04-21 HISTORY — PX: ULTRASOUND GUIDANCE FOR VASCULAR ACCESS: SHX6516

## 2020-04-21 HISTORY — PX: APPLICATION OF ROBOTIC ASSISTANCE FOR SPINAL PROCEDURE: SHX6753

## 2020-04-21 LAB — POCT I-STAT 7, (LYTES, BLD GAS, ICA,H+H)
Acid-Base Excess: 0 mmol/L (ref 0.0–2.0)
Bicarbonate: 26 mmol/L (ref 20.0–28.0)
Calcium, Ion: 1.09 mmol/L — ABNORMAL LOW (ref 1.15–1.40)
HCT: 34 % — ABNORMAL LOW (ref 36.0–46.0)
Hemoglobin: 11.6 g/dL — ABNORMAL LOW (ref 12.0–15.0)
O2 Saturation: 85 %
Patient temperature: 35.4
Potassium: 4 mmol/L (ref 3.5–5.1)
Sodium: 142 mmol/L (ref 135–145)
TCO2: 27 mmol/L (ref 22–32)
pCO2 arterial: 43.4 mmHg (ref 32.0–48.0)
pH, Arterial: 7.378 (ref 7.350–7.450)
pO2, Arterial: 47 mmHg — ABNORMAL LOW (ref 83.0–108.0)

## 2020-04-21 LAB — HEMOGLOBIN AND HEMATOCRIT, BLOOD
HCT: 29.2 % — ABNORMAL LOW (ref 36.0–46.0)
Hemoglobin: 9.6 g/dL — ABNORMAL LOW (ref 12.0–15.0)

## 2020-04-21 LAB — POCT I-STAT EG7
Acid-base deficit: 4 mmol/L — ABNORMAL HIGH (ref 0.0–2.0)
Bicarbonate: 21.1 mmol/L (ref 20.0–28.0)
Calcium, Ion: 0.99 mmol/L — ABNORMAL LOW (ref 1.15–1.40)
HCT: 29 % — ABNORMAL LOW (ref 36.0–46.0)
Hemoglobin: 9.9 g/dL — ABNORMAL LOW (ref 12.0–15.0)
O2 Saturation: 62 %
Patient temperature: 35.5
Potassium: 3 mmol/L — ABNORMAL LOW (ref 3.5–5.1)
Sodium: 145 mmol/L (ref 135–145)
TCO2: 22 mmol/L (ref 22–32)
pCO2, Ven: 36.2 mmHg — ABNORMAL LOW (ref 44.0–60.0)
pH, Ven: 7.366 (ref 7.250–7.430)
pO2, Ven: 30 mmHg — CL (ref 32.0–45.0)

## 2020-04-21 LAB — PREPARE RBC (CROSSMATCH)

## 2020-04-21 SURGERY — LAMINECTOMY WITH POSTERIOR LATERAL ARTHRODESIS LEVEL 2
Anesthesia: General | Site: Groin | Laterality: Right

## 2020-04-21 MED ORDER — HYDROMORPHONE HCL 1 MG/ML IJ SOLN
1.0000 mg | INTRAMUSCULAR | Status: DC | PRN
  Administered 2020-04-21 – 2020-04-23 (×4): 1 mg via INTRAVENOUS
  Filled 2020-04-21 (×4): qty 1

## 2020-04-21 MED ORDER — PHENOL 1.4 % MT LIQD: 1.0000 | OROMUCOSAL | Status: DC | PRN

## 2020-04-21 MED ORDER — PROPOFOL 10 MG/ML IV BOLUS
INTRAVENOUS | Status: DC | PRN
  Administered 2020-04-21: 150 mg via INTRAVENOUS

## 2020-04-21 MED ORDER — KETAMINE HCL 50 MG/5ML IJ SOSY
PREFILLED_SYRINGE | INTRAMUSCULAR | Status: AC
  Filled 2020-04-21: qty 5

## 2020-04-21 MED ORDER — HYDROXYZINE HCL 25 MG PO TABS
100.0000 mg | ORAL_TABLET | Freq: Four times a day (QID) | ORAL | Status: DC | PRN
  Administered 2020-04-21 – 2020-04-25 (×6): 100 mg via ORAL
  Filled 2020-04-21 (×3): qty 2
  Filled 2020-04-21 (×2): qty 4
  Filled 2020-04-21 (×2): qty 2
  Filled 2020-04-21: qty 4

## 2020-04-21 MED ORDER — IODIXANOL 320 MG/ML IV SOLN
INTRAVENOUS | Status: DC | PRN
  Administered 2020-04-21: 100 mL

## 2020-04-21 MED ORDER — SODIUM CHLORIDE 0.9 % IV SOLN: 250.0000 mL | INTRAVENOUS | Status: DC

## 2020-04-21 MED ORDER — ROCURONIUM BROMIDE 10 MG/ML (PF) SYRINGE
PREFILLED_SYRINGE | INTRAVENOUS | Status: DC | PRN
  Administered 2020-04-21: 70 mg via INTRAVENOUS
  Administered 2020-04-21: 30 mg via INTRAVENOUS
  Administered 2020-04-21: 50 mg via INTRAVENOUS

## 2020-04-21 MED ORDER — CHLORHEXIDINE GLUCONATE 0.12 % MT SOLN
15.0000 mL | Freq: Once | OROMUCOSAL | Status: AC
  Administered 2020-04-21: 15 mL via OROMUCOSAL
  Filled 2020-04-21: qty 15

## 2020-04-21 MED ORDER — AMISULPRIDE (ANTIEMETIC) 5 MG/2ML IV SOLN
INTRAVENOUS | Status: AC
  Filled 2020-04-21: qty 4

## 2020-04-21 MED ORDER — FENTANYL CITRATE (PF) 100 MCG/2ML IJ SOLN
25.0000 ug | INTRAMUSCULAR | Status: DC | PRN
  Administered 2020-04-21 (×2): 50 ug via INTRAVENOUS

## 2020-04-21 MED ORDER — EPHEDRINE SULFATE 50 MG/ML IJ SOLN: INTRAMUSCULAR | Status: DC | PRN

## 2020-04-21 MED ORDER — DEXAMETHASONE SODIUM PHOSPHATE 10 MG/ML IJ SOLN
10.0000 mg | Freq: Once | INTRAMUSCULAR | Status: DC
  Filled 2020-04-21: qty 1

## 2020-04-21 MED ORDER — VITAMIN D 25 MCG (1000 UNIT) PO TABS
1000.0000 [IU] | ORAL_TABLET | Freq: Every day | ORAL | Status: DC
  Administered 2020-04-21 – 2020-04-25 (×5): 1000 [IU] via ORAL
  Filled 2020-04-21 (×5): qty 1

## 2020-04-21 MED ORDER — LACTATED RINGERS IV SOLN: INTRAVENOUS | Status: DC

## 2020-04-21 MED ORDER — 0.9 % SODIUM CHLORIDE (POUR BTL) OPTIME
TOPICAL | Status: DC | PRN
  Administered 2020-04-21: 550 mL
  Administered 2020-04-21: 1000 mL

## 2020-04-21 MED ORDER — VANCOMYCIN HCL 1000 MG IV SOLR
INTRAVENOUS | Status: AC
  Filled 2020-04-21: qty 1000

## 2020-04-21 MED ORDER — ONDANSETRON HCL 4 MG/2ML IJ SOLN
INTRAMUSCULAR | Status: DC | PRN
  Administered 2020-04-21: 4 mg via INTRAVENOUS

## 2020-04-21 MED ORDER — AMISULPRIDE (ANTIEMETIC) 5 MG/2ML IV SOLN
10.0000 mg | Freq: Once | INTRAVENOUS | Status: AC | PRN
  Administered 2020-04-21: 10 mg via INTRAVENOUS

## 2020-04-21 MED ORDER — BUPIVACAINE HCL (PF) 0.25 % IJ SOLN
INTRAMUSCULAR | Status: AC
  Filled 2020-04-21: qty 30

## 2020-04-21 MED ORDER — SODIUM CHLORIDE 0.9% FLUSH
3.0000 mL | Freq: Two times a day (BID) | INTRAVENOUS | Status: DC
  Administered 2020-04-21 – 2020-04-24 (×6): 3 mL via INTRAVENOUS

## 2020-04-21 MED ORDER — FENTANYL CITRATE (PF) 100 MCG/2ML IJ SOLN
INTRAMUSCULAR | Status: AC
  Filled 2020-04-21: qty 2

## 2020-04-21 MED ORDER — BUSPIRONE HCL 5 MG PO TABS
20.0000 mg | ORAL_TABLET | Freq: Two times a day (BID) | ORAL | Status: DC
  Administered 2020-04-21 – 2020-04-25 (×9): 20 mg via ORAL
  Filled 2020-04-21 (×3): qty 4
  Filled 2020-04-21 (×5): qty 2
  Filled 2020-04-21: qty 4

## 2020-04-21 MED ORDER — CEFAZOLIN SODIUM-DEXTROSE 1-4 GM/50ML-% IV SOLN
1.0000 g | Freq: Three times a day (TID) | INTRAVENOUS | Status: AC
Start: 2020-04-21 — End: 2020-04-22
  Administered 2020-04-21 – 2020-04-22 (×2): 1 g via INTRAVENOUS
  Filled 2020-04-21 (×2): qty 50

## 2020-04-21 MED ORDER — CEFAZOLIN SODIUM-DEXTROSE 2-4 GM/100ML-% IV SOLN
2.0000 g | INTRAVENOUS | Status: AC
  Administered 2020-04-21: 2 g via INTRAVENOUS
  Filled 2020-04-21: qty 100

## 2020-04-21 MED ORDER — DIAZEPAM 5 MG PO TABS
5.0000 mg | ORAL_TABLET | Freq: Four times a day (QID) | ORAL | Status: DC | PRN
  Administered 2020-04-22 – 2020-04-23 (×2): 10 mg via ORAL
  Administered 2020-04-24: 5 mg via ORAL
  Administered 2020-04-24 – 2020-04-25 (×2): 10 mg via ORAL
  Filled 2020-04-21 (×3): qty 2
  Filled 2020-04-21: qty 1
  Filled 2020-04-21: qty 2
  Filled 2020-04-21: qty 1

## 2020-04-21 MED ORDER — MIDAZOLAM HCL 2 MG/2ML IJ SOLN
INTRAMUSCULAR | Status: DC | PRN
  Administered 2020-04-21: 2 mg via INTRAVENOUS

## 2020-04-21 MED ORDER — FLEET ENEMA 7-19 GM/118ML RE ENEM: 1.0000 | ENEMA | Freq: Once | RECTAL | Status: DC | PRN

## 2020-04-21 MED ORDER — QUETIAPINE FUMARATE 100 MG PO TABS
100.0000 mg | ORAL_TABLET | Freq: Every day | ORAL | Status: DC
  Administered 2020-04-21 – 2020-04-24 (×4): 100 mg via ORAL
  Filled 2020-04-21 (×4): qty 1

## 2020-04-21 MED ORDER — ALBUMIN HUMAN 5 % IV SOLN: INTRAVENOUS | Status: DC | PRN

## 2020-04-21 MED ORDER — SCOPOLAMINE 1 MG/3DAYS TD PT72
MEDICATED_PATCH | TRANSDERMAL | Status: DC | PRN
  Administered 2020-04-21: 1 via TRANSDERMAL

## 2020-04-21 MED ORDER — APREPITANT 40 MG PO CAPS
40.0000 mg | ORAL_CAPSULE | Freq: Once | ORAL | Status: AC
  Administered 2020-04-21: 40 mg via ORAL
  Filled 2020-04-21: qty 1

## 2020-04-21 MED ORDER — HYDROCODONE-ACETAMINOPHEN 10-325 MG PO TABS: 1.0000 | ORAL_TABLET | ORAL | Status: DC | PRN

## 2020-04-21 MED ORDER — POLYETHYLENE GLYCOL 3350 17 G PO PACK
17.0000 g | PACK | Freq: Every day | ORAL | Status: DC | PRN
  Administered 2020-04-23: 17 g via ORAL
  Filled 2020-04-21: qty 1

## 2020-04-21 MED ORDER — CHLORHEXIDINE GLUCONATE CLOTH 2 % EX PADS: 6.0000 | MEDICATED_PAD | Freq: Once | CUTANEOUS | Status: DC

## 2020-04-21 MED ORDER — PROPOFOL 10 MG/ML IV BOLUS
INTRAVENOUS | Status: AC
  Filled 2020-04-21: qty 20

## 2020-04-21 MED ORDER — ORAL CARE MOUTH RINSE: 15.0000 mL | Freq: Once | OROMUCOSAL | Status: AC

## 2020-04-21 MED ORDER — LIDOCAINE 2% (20 MG/ML) 5 ML SYRINGE
INTRAMUSCULAR | Status: DC | PRN
  Administered 2020-04-21: 100 mg via INTRAVENOUS

## 2020-04-21 MED ORDER — DEXAMETHASONE SODIUM PHOSPHATE 10 MG/ML IJ SOLN
INTRAMUSCULAR | Status: DC | PRN
  Administered 2020-04-21: 10 mg via INTRAVENOUS

## 2020-04-21 MED ORDER — LAMOTRIGINE 100 MG PO TABS
100.0000 mg | ORAL_TABLET | Freq: Every day | ORAL | Status: DC
  Administered 2020-04-21 – 2020-04-25 (×5): 100 mg via ORAL
  Filled 2020-04-21 (×5): qty 1

## 2020-04-21 MED ORDER — ONDANSETRON HCL 4 MG/2ML IJ SOLN
4.0000 mg | Freq: Four times a day (QID) | INTRAMUSCULAR | Status: DC | PRN
  Administered 2020-04-24: 4 mg via INTRAVENOUS
  Filled 2020-04-21: qty 2

## 2020-04-21 MED ORDER — SODIUM CHLORIDE 0.9% IV SOLUTION: Freq: Once | INTRAVENOUS | Status: AC

## 2020-04-21 MED ORDER — PHENYLEPHRINE HCL-NACL 10-0.9 MG/250ML-% IV SOLN
INTRAVENOUS | Status: DC | PRN
  Administered 2020-04-21: 15 ug/min via INTRAVENOUS

## 2020-04-21 MED ORDER — ACETAMINOPHEN 650 MG RE SUPP: 650.0000 mg | RECTAL | Status: DC | PRN

## 2020-04-21 MED ORDER — HYDROMORPHONE HCL 1 MG/ML IJ SOLN
INTRAMUSCULAR | Status: DC | PRN
  Administered 2020-04-21: .5 mg via INTRAVENOUS

## 2020-04-21 MED ORDER — FLUOXETINE HCL 20 MG PO CAPS
40.0000 mg | ORAL_CAPSULE | Freq: Every day | ORAL | Status: DC
  Administered 2020-04-21 – 2020-04-25 (×5): 40 mg via ORAL
  Filled 2020-04-21 (×8): qty 2

## 2020-04-21 MED ORDER — KETAMINE INJECTION FOR OPTIME (MG/KG/HR) DOCUMENTATION
INTRAVENOUS | Status: DC | PRN
  Administered 2020-04-21 (×5): 10 mg via INTRAVENOUS

## 2020-04-21 MED ORDER — BISACODYL 10 MG RE SUPP
10.0000 mg | Freq: Every day | RECTAL | Status: DC | PRN
  Administered 2020-04-24: 10 mg via RECTAL
  Filled 2020-04-21 (×2): qty 1

## 2020-04-21 MED ORDER — CHLORHEXIDINE GLUCONATE CLOTH 2 % EX PADS
6.0000 | MEDICATED_PAD | Freq: Every day | CUTANEOUS | Status: DC
  Administered 2020-04-22 – 2020-04-23 (×2): 6 via TOPICAL

## 2020-04-21 MED ORDER — MIDAZOLAM HCL 2 MG/2ML IJ SOLN
INTRAMUSCULAR | Status: AC
  Filled 2020-04-21: qty 2

## 2020-04-21 MED ORDER — MENTHOL 3 MG MT LOZG
1.0000 | LOZENGE | OROMUCOSAL | Status: DC | PRN
  Filled 2020-04-21: qty 9

## 2020-04-21 MED ORDER — ACETAMINOPHEN 500 MG PO TABS
1000.0000 mg | ORAL_TABLET | Freq: Once | ORAL | Status: AC
  Administered 2020-04-21: 1000 mg via ORAL
  Filled 2020-04-21: qty 2

## 2020-04-21 MED ORDER — SUGAMMADEX SODIUM 200 MG/2ML IV SOLN
INTRAVENOUS | Status: DC | PRN
  Administered 2020-04-21: 200 mg via INTRAVENOUS

## 2020-04-21 MED ORDER — THROMBIN 20000 UNITS EX SOLR: CUTANEOUS | Status: DC | PRN

## 2020-04-21 MED ORDER — THROMBIN 20000 UNITS EX SOLR
CUTANEOUS | Status: AC
  Filled 2020-04-21: qty 20000

## 2020-04-21 MED ORDER — ONDANSETRON HCL 4 MG PO TABS
4.0000 mg | ORAL_TABLET | Freq: Four times a day (QID) | ORAL | Status: DC | PRN
  Administered 2020-04-24 – 2020-04-25 (×2): 4 mg via ORAL
  Filled 2020-04-21 (×2): qty 1

## 2020-04-21 MED ORDER — HYDROMORPHONE HCL 1 MG/ML IJ SOLN
INTRAMUSCULAR | Status: AC
  Filled 2020-04-21: qty 0.5

## 2020-04-21 MED ORDER — BUPIVACAINE HCL (PF) 0.25 % IJ SOLN
INTRAMUSCULAR | Status: DC | PRN
  Administered 2020-04-21: 20 mL

## 2020-04-21 MED ORDER — FENTANYL CITRATE (PF) 250 MCG/5ML IJ SOLN
INTRAMUSCULAR | Status: AC
  Filled 2020-04-21: qty 5

## 2020-04-21 MED ORDER — FENTANYL CITRATE (PF) 250 MCG/5ML IJ SOLN
INTRAMUSCULAR | Status: DC | PRN
  Administered 2020-04-21: 25 ug via INTRAVENOUS
  Administered 2020-04-21 (×2): 50 ug via INTRAVENOUS
  Administered 2020-04-21: 25 ug via INTRAVENOUS
  Administered 2020-04-21 (×2): 50 ug via INTRAVENOUS

## 2020-04-21 MED ORDER — OXYCODONE HCL 5 MG PO TABS
10.0000 mg | ORAL_TABLET | ORAL | Status: DC | PRN
  Administered 2020-04-22 – 2020-04-24 (×8): 10 mg via ORAL
  Filled 2020-04-21 (×10): qty 2

## 2020-04-21 MED ORDER — VANCOMYCIN HCL 500 MG IV SOLR
INTRAVENOUS | Status: DC | PRN
  Administered 2020-04-21: 1000 mg via TOPICAL

## 2020-04-21 MED ORDER — GABAPENTIN 300 MG PO CAPS
300.0000 mg | ORAL_CAPSULE | Freq: Every day | ORAL | Status: DC
  Administered 2020-04-21 – 2020-04-24 (×4): 300 mg via ORAL
  Filled 2020-04-21 (×5): qty 1

## 2020-04-21 MED ORDER — ACETAMINOPHEN 325 MG PO TABS: 650.0000 mg | ORAL_TABLET | ORAL | Status: DC | PRN

## 2020-04-21 MED ORDER — SODIUM CHLORIDE 0.9 % IV SOLN
INTRAVENOUS | Status: DC | PRN
  Administered 2020-04-21: 500 mL

## 2020-04-21 MED ORDER — VITAMIN B-12 100 MCG PO TABS
100.0000 ug | ORAL_TABLET | Freq: Every day | ORAL | Status: DC
  Administered 2020-04-21 – 2020-04-25 (×5): 100 ug via ORAL
  Filled 2020-04-21 (×5): qty 1

## 2020-04-21 MED ORDER — SODIUM CHLORIDE 0.9% FLUSH: 3.0000 mL | INTRAVENOUS | Status: DC | PRN

## 2020-04-21 MED ORDER — PROPOFOL 500 MG/50ML IV EMUL
INTRAVENOUS | Status: DC | PRN
  Administered 2020-04-21: 25 ug/kg/min via INTRAVENOUS

## 2020-04-21 MED ORDER — HYDROCHLOROTHIAZIDE 12.5 MG PO CAPS
12.5000 mg | ORAL_CAPSULE | Freq: Every day | ORAL | Status: DC
  Administered 2020-04-21 – 2020-04-23 (×3): 12.5 mg via ORAL
  Filled 2020-04-21 (×5): qty 1

## 2020-04-21 MED ORDER — SODIUM CHLORIDE 0.9 % IV SOLN
INTRAVENOUS | Status: AC
  Filled 2020-04-21: qty 1.2

## 2020-04-21 SURGICAL SUPPLY — 85 items
BAG DECANTER FOR FLEXI CONT (MISCELLANEOUS) ×5 IMPLANT
BENZOIN TINCTURE PRP APPL 2/3 (GAUZE/BANDAGES/DRESSINGS) ×5 IMPLANT
BIT DRILL LONG 3.0X30 (BIT) ×5 IMPLANT
BIT DRILL LONG 3X80 (BIT) IMPLANT
BIT DRILL LONG 4X80 (BIT) ×5 IMPLANT
BIT DRILL SHORT 3.0X30 (BIT) IMPLANT
BIT DRILL SHORT 3X80 (BIT) IMPLANT
BLADE CLIPPER SURG (BLADE) IMPLANT
BLADE SURG 11 STRL SS (BLADE) ×5 IMPLANT
BUR CUTTER 7.0 ROUND (BURR) ×5 IMPLANT
BUR MATCHSTICK NEURO 3.0 LAGG (BURR) ×5 IMPLANT
CANISTER SUCT 3000ML PPV (MISCELLANEOUS) ×5 IMPLANT
CARTRIDGE OIL MAESTRO DRILL (MISCELLANEOUS) ×4 IMPLANT
CATH OMNI FLUSH .035X70CM (CATHETERS) ×5 IMPLANT
CNTNR URN SCR LID CUP LEK RST (MISCELLANEOUS) ×4 IMPLANT
CONT SPEC 4OZ STRL OR WHT (MISCELLANEOUS) ×5
COVER BACK TABLE 60X90IN (DRAPES) ×5 IMPLANT
COVER WAND RF STERILE (DRAPES) ×10 IMPLANT
DECANTER SPIKE VIAL GLASS SM (MISCELLANEOUS) ×5 IMPLANT
DERMABOND ADVANCED (GAUZE/BANDAGES/DRESSINGS) ×2
DERMABOND ADVANCED .7 DNX12 (GAUZE/BANDAGES/DRESSINGS) ×8 IMPLANT
DIFFUSER DRILL AIR PNEUMATIC (MISCELLANEOUS) ×5 IMPLANT
DRAPE C-ARM 42X72 X-RAY (DRAPES) ×10 IMPLANT
DRAPE HALF SHEET 40X57 (DRAPES) IMPLANT
DRAPE INCISE IOBAN 66X45 STRL (DRAPES) ×5 IMPLANT
DRAPE LAPAROTOMY 100X72X124 (DRAPES) ×5 IMPLANT
DRAPE SHEET LG 3/4 BI-LAMINATE (DRAPES) ×5 IMPLANT
DRAPE SURG 17X23 STRL (DRAPES) ×20 IMPLANT
DRSG OPSITE POSTOP 4X6 (GAUZE/BANDAGES/DRESSINGS) ×5 IMPLANT
DRSG OPSITE POSTOP 4X8 (GAUZE/BANDAGES/DRESSINGS) ×5 IMPLANT
DURAPREP 26ML APPLICATOR (WOUND CARE) ×5 IMPLANT
ELECT BLADE 4.0 EZ CLEAN MEGAD (MISCELLANEOUS)
ELECT REM PT RETURN 9FT ADLT (ELECTROSURGICAL) ×5
ELECTRODE BLDE 4.0 EZ CLN MEGD (MISCELLANEOUS) IMPLANT
ELECTRODE REM PT RTRN 9FT ADLT (ELECTROSURGICAL) ×4 IMPLANT
EVACUATOR 1/8 PVC DRAIN (DRAIN) ×5 IMPLANT
GAUZE 4X4 16PLY RFD (DISPOSABLE) IMPLANT
GAUZE SPONGE 4X4 12PLY STRL (GAUZE/BANDAGES/DRESSINGS) ×5 IMPLANT
GLOVE ECLIPSE 9.0 STRL (GLOVE) ×10 IMPLANT
GLOVE EXAM NITRILE XL STR (GLOVE) IMPLANT
GOWN STRL REUS W/ TWL LRG LVL3 (GOWN DISPOSABLE) ×16 IMPLANT
GOWN STRL REUS W/ TWL XL LVL3 (GOWN DISPOSABLE) ×8 IMPLANT
GOWN STRL REUS W/TWL 2XL LVL3 (GOWN DISPOSABLE) IMPLANT
GOWN STRL REUS W/TWL LRG LVL3 (GOWN DISPOSABLE) ×20
GOWN STRL REUS W/TWL XL LVL3 (GOWN DISPOSABLE) ×10
GRAFT BN 5X1XSPNE CVD POST DBM (Bone Implant) ×4 IMPLANT
GRAFT BONE MAGNIFUSE 1X5CM (Bone Implant) ×5 IMPLANT
GUIDEWIRE 18IN BLUNT CD HORIZ (WIRE) ×20 IMPLANT
KIT BASIN OR (CUSTOM PROCEDURE TRAY) ×10 IMPLANT
KIT INFUSE SMALL (Orthopedic Implant) ×5 IMPLANT
KIT MICROPUNCTURE NIT STIFF (SHEATH) ×5 IMPLANT
KIT SPINE MAZOR X ROBO DISP (MISCELLANEOUS) ×5 IMPLANT
KIT TURNOVER KIT B (KITS) ×5 IMPLANT
NEEDLE HYPO 22GX1.5 SAFETY (NEEDLE) ×5 IMPLANT
NS IRRIG 1000ML POUR BTL (IV SOLUTION) ×5 IMPLANT
OIL CARTRIDGE MAESTRO DRILL (MISCELLANEOUS) ×5
PACK ENDOVASCULAR (PACKS) ×5 IMPLANT
PACK LAMINECTOMY NEURO (CUSTOM PROCEDURE TRAY) ×5 IMPLANT
PIN HEAD 2.5X60MM (PIN) IMPLANT
ROD SOLERA 70MM (Rod) ×10 IMPLANT
ROD SOLERA 70X5.5XNS TI (Rod) ×8 IMPLANT
SCREW BS CNCMA S 8.5X80 T/C (Screw) ×10 IMPLANT
SCREW MAS FEN STRT 65X35 (Screw) ×5 IMPLANT
SCREW SCHANZ SA 4.0MM (MISCELLANEOUS) ×5 IMPLANT
SCREW SET SOLERA (Screw) ×35 IMPLANT
SCREW SET SOLERA TI5.5 (Screw) ×28 IMPLANT
SCREW SOLERA 45X6.5XMA (Screw) ×16 IMPLANT
SCREW SOLERA 6.5X45MM (Screw) ×20 IMPLANT
SHEATH PINNACLE 5F 10CM (SHEATH) ×10 IMPLANT
SHEATH PINNACLE 8F 10CM (SHEATH) ×5 IMPLANT
SPONGE SURGIFOAM ABS GEL 100 (HEMOSTASIS) ×5 IMPLANT
STOPCOCK MORSE 400PSI 3WAY (MISCELLANEOUS) ×5 IMPLANT
STRIP CLOSURE SKIN 1/2X4 (GAUZE/BANDAGES/DRESSINGS) ×5 IMPLANT
SUT MNCRL AB 4-0 PS2 18 (SUTURE) ×5 IMPLANT
SUT VIC AB 0 CT1 18XCR BRD8 (SUTURE) ×8 IMPLANT
SUT VIC AB 0 CT1 8-18 (SUTURE) ×10
SUT VIC AB 2-0 CT1 18 (SUTURE) ×5 IMPLANT
SUT VIC AB 3-0 SH 8-18 (SUTURE) ×10 IMPLANT
TOWEL GREEN STERILE (TOWEL DISPOSABLE) ×10 IMPLANT
TOWEL GREEN STERILE FF (TOWEL DISPOSABLE) ×10 IMPLANT
TRAY FOLEY MTR SLVR 16FR STAT (SET/KITS/TRAYS/PACK) ×5 IMPLANT
TUBE MAZOR SA REDUCTION (TUBING) ×5 IMPLANT
TUBING INJECTOR 48 (MISCELLANEOUS) ×5 IMPLANT
WATER STERILE IRR 1000ML POUR (IV SOLUTION) ×5 IMPLANT
WIRE STARTER BENTSON 035X150 (WIRE) ×10 IMPLANT

## 2020-04-21 NOTE — Anesthesia Postprocedure Evaluation (Signed)
Anesthesia Post Note  Patient: Tamara Mccullough  Procedure(s) Performed: L5-S1 revision posterior lateral fusion with L5 -S2 alar/iliac fixation using Mazor (N/A Back) APPLICATION OF ROBOTIC ASSISTANCE FOR SPINAL PROCEDURE (N/A ) AORTOGRAM (N/A Abdomen) ULTRASOUND GUIDANCE FOR VASCULAR ACCESS OF RIGHT FEMORAL ARTERY WITH PERCLOSE (Right Groin)     Patient location during evaluation: PACU Anesthesia Type: General Level of consciousness: sedated Pain management: pain level controlled Vital Signs Assessment: post-procedure vital signs reviewed and stable Respiratory status: spontaneous breathing and respiratory function stable Cardiovascular status: stable Postop Assessment: no apparent nausea or vomiting Anesthetic complications: no   No complications documented.  Last Vitals:  Vitals:   04/21/20 1355 04/21/20 1410  BP: (!) 110/55 (!) 110/58  Pulse: (!) 58 (!) 52  Resp: 15 13  Temp: 36.4 C   SpO2: 96% 98%    Last Pain:  Vitals:   04/21/20 1355  TempSrc:   PainSc: 3                  SINGER,JAMES DANIEL

## 2020-04-21 NOTE — Progress Notes (Signed)
Orthopedic Tech Progress Note Patient Details:  SHALANE FLORENDO July 05, 1955 [id number] Patient has brace Patient ID: [name], female   DOB: [birth year], 65 y.o.   MRN: [id number]   Chip Boer 04/21/2020, 4:39 PM

## 2020-04-21 NOTE — Consult Note (Signed)
Hospital Consult    Reason for Consult: Intra-Op consult, suspected arterial bleeding during posterior instrumentation at L4 Referring Physician: Dr. Earnie Larsson neurosurgery MRN #:  [id number]  History of Present Illness: This is a 65 y.o. female that vascular surgery was consulted Intra-Op given concern for arterial bleeding during posterior instrumentation at L4.  Apparently at the time of the initial incident she did drop her blood pressure.  By the time I arrived to the room she is stable from an anesthesia standpoint.  The wound was packed off.  EBL was approximately 300 mL.  Given intraoperative consult no history was obtained from the patient.  Past Medical History:  Diagnosis Date  . Allergy   . Anemia   . Arthritis    back   . Asthma    h/o  . Bipolar disorder ([place])   . Chronic back pain   . Depression   . Depression   . Dyspnea    on exertion  . Edema   . Hyperlipidemia   . Hypertension   . Insomnia   . Morbid obesity ([place])   . Neuromuscular disorder ([place])   . Overdose of sleeping tabs 03/10/2013  . Panic attacks   . Pneumonia    h/o  . PONV (postoperative nausea and vomiting)   . Restless leg syndrome   . Schizophrenia ([place])   . Seizures ([place])    hx of seizure 05/2013; last seizure April 2019  . Sleep apnea    has not used CPAP in 3 years- not using CPAP  . Thyroid disease     Past Surgical History:  Procedure Laterality Date  . ANTERIOR CERVICAL DECOMP/DISCECTOMY FUSION N/A 11/09/2013   Procedure:  Anterior cervical decompression/diskectomy/fusion cervical four-five ,cervical five-six,cervical six-seven;  Surgeon: Floyce Stakes, MD;  Location: [facility];  Service: Neurosurgery;  Laterality: N/A;  . BACK SURGERY    . COLONOSCOPY  2011  . [facility]    . FRACTURE SURGERY     right wrist  . gastric by pass  6/11  . MINOR HEMORRHOIDECTOMY    . SPINE SURGERY    . THYROID LOBECTOMY Right 08/12/2013   Procedure: RIGHT THYROID LOBECTOMY;  Surgeon: Odis Hollingshead, MD;  Location: [facility];  Service: General;  Laterality: Right;  . TUBAL LIGATION      Allergies  Allergen Reactions  . Codeine Itching, Nausea And Vomiting, Rash and Other (See Comments)  . Hydrocodone Itching  . Melatonin Other (See Comments)    seizure  . Ambien [Zolpidem Tartrate] Other (See Comments)    forgetful  . Eszopiclone Rash  . Lansoprazole Palpitations  . Mirapex [Pramipexole] Other (See Comments)    Mouth dry  . Motrin [Ibuprofen] Nausea And Vomiting  . Prilosec [Omeprazole] Palpitations and Other (See Comments)    Makes heart beat fast.  . Trazodone And Nefazodone Other (See Comments)    fainted    Prior to Admission medications   Medication Sig Start Date End Date Taking? Authorizing Provider  acetaminophen (TYLENOL) 500 MG tablet Take 1,000 mg by mouth every 4 (four) hours as needed for moderate pain.    Yes [provider]  ALPRAZolam (XANAX) 0.25 MG tablet Take 0.25 mg by mouth at bedtime as needed for sleep. 08/11/18  Yes [provider]  aspirin EC 81 MG tablet Take 81 mg by mouth daily.   Yes [provider]  busPIRone (BUSPAR) 10 MG tablet Take 2 tablets (20 mg total) by mouth 2 (two) times  daily. 02/22/20  Yes Hisada, Elie Goody, MD  Cyanocobalamin (VITAMIN B-12 PO) Take 1 tablet by mouth daily.   Yes [provider]  diclofenac sodium (VOLTAREN) 1 % GEL Apply 4 g topically 4 (four) times daily. Patient taking differently: Apply 4 g topically 4 (four) times daily as needed (pain). 03/24/17  Yes Raylene Everts, MD  FLUoxetine (PROZAC) 40 MG capsule Take 1 capsule (40 mg total) by mouth daily. 02/22/20  Yes Hisada, Elie Goody, MD  gabapentin (NEURONTIN) 300 MG capsule Take 300 mg by mouth at bedtime. 09/17/19  Yes [provider]  hydrochlorothiazide (MICROZIDE) 12.5 MG capsule Take 1 capsule (12.5 mg total) by mouth daily. 08/22/16  Yes Raylene Everts, MD  hydrOXYzine (ATARAX/VISTARIL) 50 MG tablet Take 1 tablet  (50 mg total) by mouth 3 (three) times daily as needed. Patient taking differently: Take 100 mg by mouth in the morning and at bedtime. 02/22/20  Yes Norman Clay, MD  lamoTRIgine (LAMICTAL) 100 MG tablet Take 1 tablet (100 mg total) by mouth daily. 02/22/20  Yes Norman Clay, MD  QUEtiapine (SEROQUEL) 100 MG tablet Take 1 tablet (100 mg total) by mouth at bedtime. 02/22/20  Yes Norman Clay, MD  VITAMIN D PO Take 1 tablet by mouth daily.   Yes [provider]  cyclobenzaprine (FLEXERIL) 5 MG tablet Take 1 tablet (5 mg total) by mouth 3 (three) times daily as needed for muscle spasms. 02/07/20   Vashti Hey, MD  oxyCODONE-acetaminophen (PERCOCET/ROXICET) 5-325 MG tablet Take 1 tablet by mouth every 6 (six) hours as needed. 03/29/20   [provider]  traMADol (ULTRAM) 50 MG tablet Take 1-2 tablets (50-100 mg total) by mouth every 6 (six) hours as needed for severe pain. Patient not taking: No sig reported 10/07/19   Viona Gilmore D, NP    Social History   Socioeconomic History  . Marital status: Divorced    Spouse name: Not on file  . Number of children: 1  . Years of education: 110  . Highest education level: Not on file  Occupational History  . Occupation: disability    Comment: due to back, depression/bipolar  Tobacco Use  . Smoking status: Former Smoker    Quit date: 02/05/1996    Years since quitting: 24.2  . Smokeless tobacco: Never Used  Vaping Use  . Vaping Use: Never used  Substance and Sexual Activity  . Alcohol use: Not Currently    Comment: heavy liquor for the last year   . Drug use: Not Currently    Types: Benzodiazepines  . Sexual activity: Not Currently  Other Topics Concern  . 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 Congestive Heart Failure Mother   . Diabetes Mother        died at 24  . Cancer Mother        cervical  . Arthritis Mother   . Asthma Mother   . Depression Mother   . Heart disease Mother   . Hypertension Mother   .  Congestive Heart Failure Father   . Emphysema Father   . Alcohol abuse Father   . Diabetes Father   . Arthritis Father   . COPD Father   . Depression Father   . Heart disease Father        heart attack died at 52  . Hypertension Father   . Stroke Father   . Suicidality Sister   . Colon polyps Sister   . Breast cancer Sister   . Alcohol abuse Brother   . Alcohol abuse Paternal Grandfather   . Alcohol abuse Brother   . Alcohol abuse Brother   . Colon cancer Neg Hx   . Esophageal cancer Neg Hx   . Stomach cancer Neg Hx   . Rectal cancer Neg Hx     ROS: [x]  Positive   [ ]  Negative   [ ]  All sytems reviewed and are negative  Cardiovascular: []  chest pain/pressure []  palpitations []  SOB lying flat []  DOE []  pain in legs while walking []  pain in legs at rest []  pain in legs at night []  non-healing ulcers []  hx of DVT []  swelling in legs  Pulmonary: []  productive cough []  asthma/wheezing []  home O2  Neurologic: []  weakness in []  arms []  legs []  numbness in []  arms []  legs []  hx of CVA []  mini stroke [] difficulty speaking or slurred speech []  temporary loss of vision in one eye []  dizziness  Hematologic: []  hx of cancer []  bleeding problems []  problems with blood clotting easily  Endocrine:   []  diabetes []  thyroid disease  GI []  vomiting blood []  blood in stool  GU: []  CKD/renal failure []  HD--[]  M/W/F or []  T/T/S []  burning with urination []  blood in urine  Psychiatric: []  anxiety []  depression  Musculoskeletal: []  arthritis []  joint  pain  Integumentary: []  rashes []  ulcers  Constitutional: []  fever []  chills   Physical Examination  Vitals:   04/21/20 0634  BP: 116/60  Pulse: (!) 56  Resp: 17  Temp: 98.7 F (37.1 C)  SpO2: 99%   Body mass index is 32.62 kg/m.  Patient is prone in the OR under general anesthesia  CBC    Component Value Date/Time   WBC 6.7 04/19/2020 1500   RBC 5.20 (H) 04/19/2020 1500   HGB 13.5 04/19/2020 1500   HGB 12.6 04/22/2016 0904   HCT 43.4 04/19/2020 1500   HCT 38.8 04/22/2016 0904   PLT 264 04/19/2020 1500   PLT 213 04/22/2016 0904   MCV 83.5 04/19/2020 1500   MCV 91 04/22/2016 0904   MCH 26.0 04/19/2020 1500   MCHC 31.1 04/19/2020 1500   RDW 16.1 (H) 04/19/2020 1500   RDW 14.5 04/22/2016 0904   LYMPHSABS 2.2 04/19/2020 1500   LYMPHSABS 2.0 04/22/2016 0904   MONOABS 0.4 04/19/2020 1500   EOSABS 0.2 04/19/2020 1500   EOSABS 0.2 04/22/2016 0904   BASOSABS 0.1 04/19/2020 1500   BASOSABS 0.0 04/22/2016 0904    BMET    Component Value Date/Time   NA 138 04/19/2020 1500   NA 143 04/22/2016 0904   K 3.4 (L) 04/19/2020 1500   CL 106 04/19/2020 1500   CO2 23 04/19/2020 1500   GLUCOSE 96 04/19/2020 1500   BUN 13 04/19/2020 1500   BUN 22 04/22/2016 0904   CREATININE 0.78 04/19/2020 1500   CREATININE 0.65 11/07/2016 0851   CALCIUM 8.2 (L) 04/19/2020 1500   GFRNONAA >60 04/19/2020 1500   GFRNONAA 95 11/07/2016 0851   GFRAA >60 09/24/2019  [place] 110 11/07/2016 0851    COAGS: Lab Results  Component Value Date   INR 0.9 03/24/2017   INR 0.97 07/28/2013     Non-Invasive Vascular Imaging:    I have reviewed her previous CT pelvis without contrast and she has a circumferentially calcified infrarenal aorta   ASSESSMENT/PLAN: This is a 65 y.o. female that vascular surgery was consulted Intra-Op for suspected arterial bleeding during posterior instrumentation at L4.  At the time of my arrival for intraoperative consult she was hemodynamically stable.   The plan is to transfer her to OR 16 urgently for femoral access and aortogram.  If evidence of arterial injury will evalaute options for covered stent repair.  Certainly concern for aortic injury.  Certainly if she drops her blood pressure again she will need emergent laparotomy.     Marty Heck, MD [facility] Office: [place]

## 2020-04-21 NOTE — Op Note (Signed)
Date: April 21, 2020  Preoperative diagnosis: Concern for arterial bleeding during posterior instrumentation at L4  Postoperative diagnosis: Same  Procedure: 1.  Ultrasound-guided access of right common femoral artery with percutaneous closure 2.  Aortogram including catheter selection of aorta  Surgeon: Dr. Marty Heck, MD  Assistant: OR staff  Indication: Patient is a 65 year old female that was having posterior instrumentation with revision of a previous lumbar fusion.  During screw fixation at L4 there was noted to be bright arterial bleeding with approximate 300 mL blood loss.  Patient had a brief drop in her blood pressure but then this immediately recovered.  Vascular surgery was consulted intra-op.  Given her hemodynamic stability at that time when we arrived, we elected to take her to the OR 16 for aortogram to evaluate for arterial injury.    Findings: Aortogram was performed in both AP and lateral orientation as well as right and left anterior oblique.  After evaluating all the images we saw a widely patent infrarenal aorta as well as patent bilateral iliac arteries with no evidence of active blush or other evidence of arterial injury.  Of note she remained stable during the entire case.  Anesthesia: General  Details: Patient was taken to OR 16 urgently from OR 20.  She was placed on the table in supine position and remained under general anesthesia.  Bilateral groins and the abdominal wall were then prepped and draped in usual sterile fashion.  Timeout was performed even though this was emergent consent.  Initially the right common femoral artery was evaluated with ultrasound it was patent and image was saved.  I accessed the right common femoral artery with a micro access needle and placed a microwire into the right common femoral artery.  I made a small stab incision around the wire with 11 blade and dissected down with a hemostat.  I then placed a Bentson wire into the  infrarenal aorta.  I then predilated the arteriotomy with an 8 French sheath dilator.  I then deployed two Perclose devices at 10:00 and 2:00 in the instance that we needed to place a larger sheath for delivery of endograft.  That point in time, a Bentson wire and Omni Flush catheter were advanced into the infrarenal aorta.  We then got aortogram both in the AP as well as lateral positions and then in the right anterior and left anterior oblique positions.  We evaluated the imaging and she had a patent infrarenal aorta with no evidence of arterial injury or active blush.  We could visualize her lumbar branches with no active blush.  All of her iliacs also were widely patent with no evidence of active blush.  Given no injury identified and the patient was hemodynamically stable we elected not to place any endograft.  At that point time wires and catheters were removed.  I then deployed the two Perclose devices in the right common femoral artery with good hemostasis.  She had a palpable femoral pulse and a good signal in the right foot at completion.  The groin incision was then closed with a 4-0 Monocryl and Dermabond.  Complication: None  Condition: Stable  Marty Heck, MD [facility] Office: [place]

## 2020-04-21 NOTE — Anesthesia Procedure Notes (Signed)
Procedure Name: Intubation Date/Time: 04/21/2020 8:15 AM Performed by: Clearnce Sorrel, CRNA Pre-anesthesia Checklist: Patient identified, Emergency Drugs available, Suction available, Patient being monitored and Timeout performed Patient Re-evaluated:Patient Re-evaluated prior to induction Oxygen Delivery Method: Circle system utilized Preoxygenation: Pre-oxygenation with 100% oxygen Induction Type: IV induction Ventilation: Mask ventilation without difficulty Laryngoscope Size: Mac and 3 Grade View: Grade I Tube type: Oral Tube size: 7.0 mm Number of attempts: 1 Airway Equipment and Method: Stylet Placement Confirmation: ETT inserted through vocal cords under direct vision,  positive ETCO2 and breath sounds checked- equal and bilateral Secured at: 22 cm Tube secured with: Tape Dental Injury: Teeth and Oropharynx as per pre-operative assessment

## 2020-04-21 NOTE — Brief Op Note (Signed)
04/21/2020  11:01 AM  PATIENT:  Tamara Mccullough  65 y.o. female  PRE-OPERATIVE DIAGNOSIS:  Pseudoarthrosis  POST-OPERATIVE DIAGNOSIS:  pseudoarthrosis  PROCEDURE:  Procedure(s): L5-S1 revision posterior lateral fusion with L5 -S2 alar/iliac fixation using Mazor (N/A) APPLICATION OF ROBOTIC ASSISTANCE FOR SPINAL PROCEDURE (N/A) AORTOGRAM (N/A)  SURGEON:  Surgeon(s) and Role: Panel 1:    * Earnie Larsson, MD - Primary    * Ashok Pall, MD - Assisting Panel 2:    Marty Heck, MD - Primary  PHYSICIAN ASSISTANT:   ASSISTANTSMearl Latin   ANESTHESIA:   general  EBL:  300 mL   BLOOD ADMINISTERED:none  DRAINS: none   LOCAL MEDICATIONS USED:  MARCAINE     SPECIMEN:  No Specimen  DISPOSITION OF SPECIMEN:  N/A  COUNTS:  YES  TOURNIQUET:  * No tourniquets in log *  DICTATION: .Dragon Dictation  PLAN OF CARE: Admit to inpatient   PATIENT DISPOSITION:  PACU - hemodynamically stable.   Delay start of Pharmacological VTE agent (>24hrs) due to surgical blood loss or risk of bleeding: yes

## 2020-04-21 NOTE — Op Note (Signed)
Date of procedure: 04/21/2020  Date of dictation: Same  Service: Neurosurgery  Preoperative diagnosis: L5-S1 pseudoarthrosis with hardware failure  Postoperative diagnosis: Same  Procedure Name: Reexploration of lumbosacral fusion with revision lumbar fusion from L4-S2 with segmental pedicle screw fixation and sacral iliac screw fixation, bone neurogenic protein, and morselized allograft  Utilization of robot stereotaxis for screw placement  Surgeon:Xyla Leisner A.Ysela Hettinger, M.D.  Asst. Surgeon: Christella Noa, MD    Reinaldo Meeker, NP  Anesthesia: General  Indication: Six 65-year-old female status post prior L5-S1 decompression and fusion.  Patient has developed worsening back pain and right lower extremity pain.  Work-up demonstrates evidence of pseudoarthrosis at L5-S1 with hardware loosening and some anterior migration of her interbody cages.  No evidence of infection or other complicating feature.  Patient in poor overall health but is miserable with intractable pain.  Plan is for revision lumbosacral fusion utilizing intraoperative robot stereotaxis.  Operative note: After induction of anesthesia, patient position prone onto a Jackson table and appropriately padded.  Patient's lumbar and sacral region prepped and draped sterilely.  A pin was placed into her right superior iliac spine and a registration arm was attached to the [place] system.  The Mazor robot system was then initialized and was integrated with AP and lateral fluoroscopic images for navigation.  After navigation was confirmed to be good the wound was reopened and dissection was performed bilaterally exposing the prior instrumentation at L5 and S1.  The screws were disassembled and removed.  The spine was not grossly unstable but there is no evidence of solid bony fusion.  Using the [place] robot for guidance entry sites for S2 alar iliac screw placement were made first on the right and then on the left.  The channels were drilled and a guidewire  was placed.  The trajectories were then tapped with a screw tap and 8.5 mm x 13mm screws from [employer] were placed bilaterally at S2 ala and iliac wing with good bony purchase.  The robot was then used to redirect the screw into the S1 pedicle and ala on the left.  A 88mm x 6.5 mm screw was placed at that level.  The pedicle of L5 was then attempted to be revised.  The guidance was somewhat lateral but I was able to direct a screw in an acceptable position with decent bony purchase but I did not feel that the bony purchase was substantial enough to stand-alone.  With that in mind I decided to take her fusion up to the L4 level.  We attempted to use the robot for guidance to the L4 level.  The robot docked to the planned entry site.  A pilot hole was drilled.  A inner cannula was placed and felt solid within the bone.  A pin was placed and the introducers were removed.  The planned screw hole was tapped.  The screw hole was tapped and it appeared to be in bone although it did not feel completely solid.  I placed a 6.5 mm x 45 mm screw.  I was not happy with the tactile feedback I was getting from the screw and decided to remove the screw.  Upon removal of the screw there was brisk heavy arterial bleeding.  This was packed off immediately with Gelfoam and sponges and the screw track hole was controlled but I remained alarmed with the degree of bleeding.  Once again the bleeding appeared to be arterial and not venous.  The bleeding did not recur but the patient did begin  to have some hypotension.  The patient responded well to fluids.  I put in a call to vascular surgery.  I continue to work but watch her blood pressure continuously at that time.  I brought in fluoroscopy and then redirected a pedicle probe into the pedicle of L4 on the left side.  I tapped the all track and placed a 6.5 millimeter screw into the L4 pedicle on the left with good position confirmed by both AP and lateral fluoroscopy.  I attempted to  use the robot again on the right side but was not happy with the trajectory of the planned track and aborted that after somewhat medial passage as demonstrated by the fluoroscopy.  I identified a suitable entry point for the pedicle screw on the right at L4.  I passed an awl into the pedicle and then tapped the hole with a screw tap and then placed a 6.5 millimeter screw on the right at L4.  At L5 I used fluoroscopy and used an awl to redirect a screw into the pedicle of L5.  The screw tract was tapped and then a 6.5 millimeter screw was placed.  I decided against placing a screw and S1 on the right side as I did think it would not provide much fixation.  Short segment of titanium rods were then placed over the screw heads from L4 down to S2.  Locking caps were then placed over the screws.  Locking caps were then engaged sequentially.  Final images reveal good position of the hardware at the proper operative level with improved alignment of the spine.  The transverse processes of L5-S1 and L4 were decorticated.  Morselized allograft and bone morphogenic protein soaked sponges were placed posterior laterally.  Vancomycin powder was placed in the deep wound space.  Wounds and closed in layers of Vicryl sutures.  During this time I remained in communication with both the anesthesia service and the vascular surgery service.  We decided as that the patient was stable at this time that it was safe to complete the operation and that the plan was to do an arteriogram to look for possible vascular injury at the conclusion of the surgery.  The patient's hemoglobin level dropped somewhat from 11.1-9.9.  She did not have any hemodynamic instability.  She continued to make urine.  Steri-Strips and sterile dressing were applied.  Patient tolerated the procedure reasonably well.  She returns to the vascular operating room for intraoperative arteriogram.

## 2020-04-21 NOTE — H&P (Signed)
Tamara Mccullough is an 65 y.o. female.   Chief Complaint: Back pain HPI: 14 57-year-old female status post L5-S1 decompression and fusion surgery.  Patient with progressively worsening back pain and right lower extremity radicular pain failing conservative management her work-up demonstrates evidence of hardware loosening and worsening spondylolisthesis at L5-S1 with resultant foraminal stenosis and irritation of her right L5 nerve root.  Patient presents now for reexploration of her lumbar fusion with fusion and hardware revision.  Past Medical History:  Diagnosis Date  . Allergy   . Anemia   . Arthritis    back   . Asthma    h/o  . Bipolar disorder ([place])   . Chronic back pain   . Depression   . Depression   . Dyspnea    on exertion  . Edema   . Hyperlipidemia   . Hypertension   . Insomnia   . Morbid obesity ([place])   . Neuromuscular disorder ([place])   . Overdose of sleeping tabs 03/10/2013  . Panic attacks   . Pneumonia    h/o  . PONV (postoperative nausea and vomiting)   . Restless leg syndrome   . Schizophrenia ([place])   . Seizures ([place])    hx of seizure 05/2013; last seizure April 2019  . Sleep apnea    has not used CPAP in 3 years- not using CPAP  . Thyroid disease     Past Surgical History:  Procedure Laterality Date  . ANTERIOR CERVICAL DECOMP/DISCECTOMY FUSION N/A 11/09/2013   Procedure:  Anterior cervical decompression/diskectomy/fusion cervical four-five ,cervical five-six,cervical six-seven;  Surgeon: Floyce Stakes, MD;  Location: [facility];  Service: Neurosurgery;  Laterality: N/A;  . BACK SURGERY    . COLONOSCOPY  2011  . [facility]    . FRACTURE SURGERY     right wrist  . gastric by pass  6/11  . MINOR HEMORRHOIDECTOMY    . SPINE SURGERY    . THYROID LOBECTOMY Right 08/12/2013   Procedure: RIGHT THYROID LOBECTOMY;  Surgeon: Odis Hollingshead, MD;  Location: [facility];  Service: General;  Laterality: Right;  . TUBAL LIGATION      Family History  Problem  Relation Age of Onset  . Congestive Heart Failure Mother   . Diabetes Mother        died at 37  . Cancer Mother        cervical  . Arthritis Mother   . Asthma Mother   . Depression Mother   . Heart disease Mother   . Hypertension Mother   . Congestive Heart Failure Father   . Emphysema Father   . Alcohol abuse Father   . Diabetes Father   . Arthritis Father   . COPD Father   . Depression Father   . Heart disease Father        heart attack died at 71  . Hypertension Father   . Stroke Father   . Suicidality Sister   . Colon polyps Sister   . Breast cancer Sister   . Alcohol abuse Brother   . Alcohol abuse Paternal Grandfather   . Alcohol abuse Brother   . Alcohol abuse Brother   . Colon cancer Neg Hx   . Esophageal cancer Neg Hx   . Stomach cancer Neg Hx   . Rectal cancer Neg Hx    Social History:  reports that she quit smoking about 24 years ago. She has never used smokeless tobacco. She reports previous alcohol use. She reports  previous drug use. Drug: Benzodiazepines.  Allergies:  Allergies  Allergen Reactions  . Codeine Itching, Nausea And Vomiting, Rash and Other (See Comments)  . Hydrocodone Itching  . Melatonin Other (See Comments)    seizure  . Ambien [Zolpidem Tartrate] Other (See Comments)    forgetful  . Eszopiclone Rash  . Lansoprazole Palpitations  . Mirapex [Pramipexole] Other (See Comments)    Mouth dry  . Motrin [Ibuprofen] Nausea And Vomiting  . Prilosec [Omeprazole] Palpitations and Other (See Comments)    Makes heart beat fast.  . Trazodone And Nefazodone Other (See Comments)    fainted    Medications Prior to Admission  Medication Sig Dispense Refill  . acetaminophen (TYLENOL) 500 MG tablet Take 1,000 mg by mouth every 4 (four) hours as needed for moderate pain.     Marland Kitchen ALPRAZolam (XANAX) 0.25 MG tablet Take 0.25 mg by mouth at bedtime as needed for sleep.    Marland Kitchen aspirin EC 81 MG tablet Take 81 mg by mouth daily.    . busPIRone (BUSPAR) 10 MG  tablet Take 2 tablets (20 mg total) by mouth 2 (two) times daily. 360 tablet 0  . Cyanocobalamin (VITAMIN B-12 PO) Take 1 tablet by mouth daily.    . diclofenac sodium (VOLTAREN) 1 % GEL Apply 4 g topically 4 (four) times daily. (Patient taking differently: Apply 4 g topically 4 (four) times daily as needed (pain).) 100 g 2  . FLUoxetine (PROZAC) 40 MG capsule Take 1 capsule (40 mg total) by mouth daily. 90 capsule 0  . gabapentin (NEURONTIN) 300 MG capsule Take 300 mg by mouth at bedtime.    . hydrochlorothiazide (MICROZIDE) 12.5 MG capsule Take 1 capsule (12.5 mg total) by mouth daily. 90 capsule 3  . hydrOXYzine (ATARAX/VISTARIL) 50 MG tablet Take 1 tablet (50 mg total) by mouth 3 (three) times daily as needed. (Patient taking differently: Take 100 mg by mouth in the morning and at bedtime.) 270 tablet 0  . lamoTRIgine (LAMICTAL) 100 MG tablet Take 1 tablet (100 mg total) by mouth daily. 90 tablet 0  . QUEtiapine (SEROQUEL) 100 MG tablet Take 1 tablet (100 mg total) by mouth at bedtime. 90 tablet 0  . VITAMIN D PO Take 1 tablet by mouth daily.    . cyclobenzaprine (FLEXERIL) 5 MG tablet Take 1 tablet (5 mg total) by mouth 3 (three) times daily as needed for muscle spasms. 30 tablet 0  . oxyCODONE-acetaminophen (PERCOCET/ROXICET) 5-325 MG tablet Take 1 tablet by mouth every 6 (six) hours as needed.    . traMADol (ULTRAM) 50 MG tablet Take 1-2 tablets (50-100 mg total) by mouth every 6 (six) hours as needed for severe pain. (Patient not taking: No sig reported) 40 tablet 0    Results for orders placed or performed during the hospital encounter of 04/19/20 (from the past 48 hour(s))  Surgical pcr screen     Status: Abnormal   Collection Time: 04/19/20  2:02 PM   Specimen: Nasal Mucosa; Nasal Swab  Result Value Ref Range   MRSA, PCR NEGATIVE NEGATIVE   Staphylococcus aureus POSITIVE (A) NEGATIVE    Comment: (NOTE) The Xpert SA Assay (FDA approved for NASAL specimens in patients 30 years of age  and older), is one component of a comprehensive surveillance program. It is not intended to diagnose infection nor to guide or monitor treatment. Performed at [facility], [place] [address], [place], [place] [address]   SARS CORONAVIRUS 2 (TAT 6-24 HRS) Nasopharyngeal Nasopharyngeal  Swab     Status: None   Collection Time: 04/19/20  2:03 PM   Specimen: Nasopharyngeal Swab  Result Value Ref Range   SARS Coronavirus 2 NEGATIVE NEGATIVE    Comment: (NOTE) SARS-CoV-2 target nucleic acids are NOT DETECTED.  The SARS-CoV-2 RNA is generally detectable in upper and lower respiratory specimens during the acute phase of infection. Negative results do not preclude SARS-CoV-2 infection, do not rule out co-infections with other pathogens, and should not be used as the sole basis for treatment or other patient management decisions. Negative results must be combined with clinical observations, patient history, and epidemiological information. The expected result is Negative.  Fact Sheet for Patients: SugarRoll.be  Fact Sheet for Healthcare Providers: https://www.woods-mathews.com/  This test is not yet approved or cleared by the [place] FDA and  has been authorized for detection and/or diagnosis of SARS-CoV-2 by FDA under an Emergency Use Authorization (EUA). This EUA will remain  in effect (meaning this test can be used) for the duration of the COVID-19 declaration under Se ction 564(b)(1) of the Act, 21 U.S.C. section 360bbb-3(b)(1), unless the authorization is terminated or revoked sooner.  Performed at [facility], [place] [address], [place], [place] [address]   Type and screen     Status: None   Collection Time: 04/19/20  2:39 PM  Result Value Ref Range   ABO/RH(D) A NEG    Antibody Screen NEG    Sample Expiration 05/03/2020,2359    Extend sample reason      NO TRANSFUSIONS OR PREGNANCY IN THE PAST 3 MONTHS Performed at [facility], [place] [address] [place], [place] [address]   CBC WITH DIFFERENTIAL     Status: Abnormal   Collection Time: 04/19/20  3:00 PM  Result Value Ref Range   WBC 6.7 4.0 - 10.5 K/uL   RBC 5.20 (H) 3.87 - 5.11 MIL/uL   Hemoglobin 13.5 12.0 - 15.0 g/dL   HCT 43.4 36.0 - 46.0 %   MCV 83.5 80.0 - 100.0 fL   MCH 26.0 26.0 - 34.0 pg   MCHC 31.1 30.0 - 36.0 g/dL   RDW 16.1 (H) 11.5 - 15.5 %   Platelets 264 150 - 400 K/uL   nRBC 0.0 0.0 - 0.2 %   Neutrophils Relative % 57 %   Neutro Abs 3.8 1.7 - 7.7 K/uL   Lymphocytes Relative 33 %   Lymphs Abs 2.2 0.7 - 4.0 K/uL   Monocytes Relative 6 %   Monocytes Absolute 0.4 0.1 - 1.0 K/uL   Eosinophils Relative 3 %   Eosinophils Absolute 0.2 0.0 - 0.5 K/uL   Basophils Relative 1 %   Basophils Absolute 0.1 0.0 - 0.1 K/uL   Immature Granulocytes 0 %   Abs Immature Granulocytes 0.02 0.00 - 0.07 K/uL    Comment: Performed at [facility], [address], [place], [place] [address]  Basic metabolic panel per protocol     Status: Abnormal   Collection Time: 04/19/20  3:00 PM  Result Value Ref Range   Sodium 138 135 - 145 mmol/L   Potassium 3.4 (L) 3.5 - 5.1 mmol/L   Chloride 106 98 - 111 mmol/L   CO2 23 22 - 32 mmol/L   Glucose, Bld 96 70 - 99 mg/dL    Comment: Glucose reference range applies only to samples taken after fasting for at least 8 hours.   BUN 13 8 - 23 mg/dL   Creatinine, Ser 0.78  0.44 - 1.00 mg/dL   Calcium 8.2 (L) 8.9 - 10.3 mg/dL   GFR, Estimated >60 >60 mL/min    Comment: (NOTE) Calculated using the CKD-EPI Creatinine Equation (2021)    Anion gap 9 5 - 15    Comment: Performed at [place] [facility] [address] [place], [place] [address]   No results found.  Pertinent items noted in HPI and remainder of comprehensive ROS otherwise negative.  Blood pressure 116/60, pulse (!) 56, temperature 98.7 F (37.1 C), temperature source Oral, resp. rate 17, height 5\' 5"  (1.651 m), weight 88.9 kg, SpO2 99  %.  Patient is awake and alert.  She is oriented and appropriate.  Speech is fluent.  Judgment insight are intact.  Cranial nerve function normal motor examination reveals some mild weakness of dorsiflexion on the right side.  Straight raising positive on the right side.  Sensory lamination some decrease sensation pinprick light touch in her right L5 and S1 dermatomes.  Deep tender versus normal active.  No evidence of long track signs.  Gait antalgic.  Posture is flexed peer examination head ears eyes nose throat is unremarkable chest and abdomen benign. Assessment/Plan L5-S1 pseudoarthrosis with hardware failure.  Plan L5-S1 revision posterior fusion with sacroiliac fixation utilizing intraoperative stereotactic robot.  Risks and benefits of been explained.  Patient wishes proceed.  Mallie Mussel A Pool 04/21/2020, 8:00 AM

## 2020-04-21 NOTE — Anesthesia Procedure Notes (Signed)
Arterial Line Insertion Start/End3/18/2022 11:10 AM, 04/21/2020 11:15 AM Performed by: Oletta Lamas, CRNA  Patient location: Pre-op. Preanesthetic checklist: patient identified, IV checked, site marked, risks and benefits discussed, surgical consent, monitors and equipment checked, pre-op evaluation, timeout performed and anesthesia consent Lidocaine 1% used for infiltration Left, radial was placed Catheter size: 20 Fr Hand hygiene performed  and maximum sterile barriers used   Attempts: 1 Procedure performed without using ultrasound guided technique. Following insertion, dressing applied. Post procedure assessment: normal and unchanged

## 2020-04-21 NOTE — Evaluation (Addendum)
Physical Therapy Evaluation Patient Details Name: Tamara Mccullough MRN: [id number] DOB: [birth year] Today's Date: 04/21/2020   History of Present Illness  Pt is a 65 y.o. female s/p L5-S1 decompression and fusion surgery who presented 04/21/20 with worsening back pain and R lower extremity radicular pain failing conservative management. 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 Pt now s/p L5-S1 revision posterior lateral fusion with L5-S2 alar/iliac fixation and aortogram 04/21/20 as pt had some heavy arterial bleeding during procedure. PMH: thyroid disease, seizures, schizophrenia, restless leg syndrome, panic attacks, neuromuscular disorder, obese, insomnia, HTN, dyspnea, depression, chronic back pain, bipolar disorder, asthma, arthritis, and anemia.  Clinical Impression  RN cleared pt for bed evaluation, allowing pt to sit up EOB but restricting pt from coming to stand or ambulating at this time. Thus, evaluation limited and will need to assess pt's standing balance and gait safety once appropriate at a later date. At this time, pt requires minA to roll with a rail and modA to transition sidelying <> sitting due to decreased strength and coordination and increased pain from surgery. Pt demonstrates generalizes bil lower extremity weakness (R weaker than L), impaired R foot sensation, and delayed dynamic proprioception in bil legs that could impact her safety and independence with all functional mobility, placing her at risk for falls. Pt has a hx of falls. PTA, she was living alone and functioning mod I using a rollator. As pt is requiring extensive physical assistance for bed mobility at this time she would likely benefit from intensive therapy in the CIR setting prior to return home. Will continue to assess d/c plan as pt progresses. Will continue to follow acutely.    Follow Up Recommendations CIR  (may change with progression and once able to assess standing/gait capabilities)    Equipment Recommendations  None recommended by PT    Recommendations for Other Services Rehab consult     Precautions / Restrictions Precautions Precautions: Fall;Back Precaution Booklet Issued: No Precaution Comments: A-line; Reviewed precautions verbally, pt able to recall 2/3 Required Braces or Orthoses: Spinal Brace Spinal Brace: Applied in sitting position;Lumbar corset Spinal Brace Comments: Aspen lumbar brace Restrictions Weight Bearing Restrictions: No      Mobility  Bed Mobility Overal bed mobility: Needs Assistance Bed Mobility: Rolling;Sidelying to Sit;Sit to Sidelying Rolling: Min assist Sidelying to sit: Mod assist     Sit to sidelying: Mod assist General bed mobility comments: Cued pt for log roll technique, with success but minA at R UE and trunk to complete and reach rail. ModA to manage trunk and legs with sidelying <> sitting with extra time.    Transfers                 General transfer comment: Deferred due to pt being limited to bed at this time, per orders verbalized by RN.  Ambulation/Gait             General Gait Details: Deferred due to pt being limited to bed at this time, per orders verbalized by RN.  Stairs            Wheelchair Mobility    Modified Rankin (Stroke Patients Only)       Balance Overall balance assessment: Needs assistance Sitting-balance support: Bilateral upper extremity supported;Feet unsupported Sitting balance-Leahy Scale: Poor Sitting balance - Comments: Pt relies on UE support, min guard for safety.       Standing balance  comment: Deferred due to pt being limited to bed at this time, per orders verbalized by RN.                             Pertinent Vitals/Pain Pain Assessment: 0-10 Pain Score: 9  Pain Descriptors / Indicators: Aching;Guarding;Operative site guarding;Burning Pain  Intervention(s): Limited activity within patient's tolerance;Monitored during session;Repositioned (RN aware)    Home Living Family/patient expects to be discharged to:: Private residence Living Arrangements: Alone Available Help at Discharge: Friend(s);Available PRN/intermittently;Family Type of Home: House Home Access: Stairs to enter Entrance Stairs-Rails: Left (ascending) Entrance Stairs-Number of Steps: 3 Home Layout: Two level;Able to live on main level with bedroom/bathroom (upstairs is closed off) Home Equipment: Walker - 2 wheels;Walker - 4 wheels;Cane - single point;Bedside commode;Shower seat;Grab bars - tub/shower;Toilet riser Additional Comments: goes upstairs sideways; pt wears emergency call button around neck    Prior Function Level of Independence: Independent with assistive device(s)         Comments: independent utilizing rollator, sits in rollator to clean dishes     Hand Dominance   Dominant Hand: Right    Extremity/Trunk Assessment   Upper Extremity Assessment Upper Extremity Assessment: Defer to OT evaluation    Lower Extremity Assessment Lower Extremity Assessment: RLE deficits/detail;LLE deficits/detail RLE Deficits / Details: Difficulty maintaining contractions against gravity, with quick releases, no resistance applied today; R appears weaker than L with quicker and more constant releases RLE Sensation: decreased light touch;decreased proprioception (numbness noted throughout foot; slight delay in dynamic proprioception) LLE Deficits / Details: Difficulty maintaining contractions against gravity, with quick releases, no resistance applied today LLE Sensation:  (slight delay in dynamic proprioception; intact sensation to light touch)    Cervical / Trunk Assessment Cervical / Trunk Assessment: Other exceptions Cervical / Trunk Exceptions: Spinal procedure  Communication   Communication: No difficulties  Cognition Arousal/Alertness:  Awake/alert Behavior During Therapy: WFL for tasks assessed/performed Overall Cognitive Status: Within Functional Limits for tasks assessed                                        General Comments General comments (skin integrity, edema, etc.): Skin rash noted L abdominal fold and under L breast fold, RN made aware    Exercises     Assessment/Plan    PT Assessment Patient needs continued PT services  PT Problem List Decreased strength;Decreased range of motion;Decreased activity tolerance;Decreased balance;Decreased mobility;Decreased coordination;Decreased knowledge of precautions;Impaired sensation;Pain;Obesity       PT Treatment Interventions DME instruction;Gait training;Stair training;Functional mobility training;Therapeutic activities;Therapeutic exercise;Balance training;Neuromuscular re-education;Patient/family education    PT Goals (Current goals can be found in the Care Plan section)  Acute Rehab PT Goals Patient Stated Goal: to get better PT Goal Formulation: With patient Time For Goal Achievement: 05/05/20 Potential to Achieve Goals: Good    Frequency Min 5X/week   Barriers to discharge Decreased caregiver support      Co-evaluation               AM-PAC PT "6 Clicks" Mobility  Outcome Measure Help needed turning from your back to your side while in a flat bed without using bedrails?: A Lot Help needed moving from lying on your back to sitting on the side of a flat bed without using bedrails?: A Lot Help needed moving to and from a bed to a chair (including  a wheelchair)?: A Lot Help needed standing up from a chair using your arms (e.g., wheelchair or bedside chair)?: A Lot Help needed to walk in hospital room?: A Lot Help needed climbing 3-5 steps with a railing? : A Lot 6 Click Score: 12    End of Session   Activity Tolerance: Other (comment) (limited by orders to remain at bed) Patient left: in bed;with call bell/phone within  reach;with bed alarm set;with SCD's reapplied Nurse Communication: Mobility status;Other (comment) (skin rashes) PT Visit Diagnosis: Muscle weakness (generalized) (M62.81);History of falling (Z91.81);Difficulty in walking, not elsewhere classified (R26.2);Other symptoms and signs involving the nervous system (R29.898);Pain Pain - part of body:  (back)    Time: 8569-4370 PT Time Calculation (min) (ACUTE ONLY): 43 min   Charges:   PT Evaluation $PT Eval Moderate Complexity: 1 Mod PT Treatments $Therapeutic Activity: 23-37 mins        Moishe Spice, PT, DPT Acute Rehabilitation Services  Pager: [phone] Office: [place] 04/21/2020, 5:44 PM

## 2020-04-21 NOTE — Anesthesia Postprocedure Evaluation (Signed)
Anesthesia Post Note  Patient: TOMASINA KEASLING  Procedure(s) Performed: L5-S1 revision posterior lateral fusion with L5 -S2 alar/iliac fixation using Mazor (N/A Back) APPLICATION OF ROBOTIC ASSISTANCE FOR SPINAL PROCEDURE (N/A ) AORTOGRAM (N/A Abdomen) ULTRASOUND GUIDANCE FOR VASCULAR ACCESS OF RIGHT FEMORAL ARTERY WITH PERCLOSE (Right Groin)     Patient location during evaluation: PACU Anesthesia Type: General Level of consciousness: sedated Pain management: pain level controlled Vital Signs Assessment: post-procedure vital signs reviewed and stable Respiratory status: spontaneous breathing and respiratory function stable Cardiovascular status: stable Postop Assessment: no apparent nausea or vomiting Anesthetic complications: no   No complications documented.  Last Vitals:  Vitals:   04/21/20 1355 04/21/20 1410  BP: (!) 110/55 (!) 110/58  Pulse: (!) 58 (!) 52  Resp: 15 13  Temp: 36.4 C   SpO2: 96% 98%    Last Pain:  Vitals:   04/21/20 1355  TempSrc:   PainSc: 3                  SINGER,JAMES DANIEL

## 2020-04-21 NOTE — Progress Notes (Signed)
Rehab Admissions Coordinator Note:  Patient was screened by Cleatrice Burke for appropriateness for an Inpatient Acute Rehab Consult per therapy recommendations. Patient limited on eval. I will await further progress with therapy assessments and not place a rehab consult at this time.Cleatrice Burke RN MSN 04/21/2020, 6:39 PM  I can be reached at [phone].

## 2020-04-21 NOTE — Transfer of Care (Signed)
Immediate Anesthesia Transfer of Care Note  Patient: Tamara Mccullough  Procedure(s) Performed: L5-S1 revision posterior lateral fusion with L5 -S2 alar/iliac fixation using Mazor (N/A Back) APPLICATION OF ROBOTIC ASSISTANCE FOR SPINAL PROCEDURE (N/A ) AORTOGRAM (N/A Abdomen) ULTRASOUND GUIDANCE FOR VASCULAR ACCESS OF RIGHT FEMORAL ARTERY WITH PERCLOSE (Right Groin)  Patient Location: PACU  Anesthesia Type:General  Level of Consciousness: awake, alert  and oriented  Airway & Oxygen Therapy: Patient Spontanous Breathing  Post-op Assessment: Report given to RN and Post -op Vital signs reviewed and stable  Post vital signs: Reviewed and stable  Last Vitals:  Vitals Value Taken Time  BP 144/119 04/21/20 1221  Temp    Pulse    Resp 19 04/21/20 1224  SpO2    Vitals shown include unvalidated device data.  Last Pain:  Vitals:   04/21/20 0742  TempSrc:   PainSc: 6       Patients Stated Pain Goal: 3 (50/09/38 1829)  Complications: No complications documented.

## 2020-04-22 LAB — HEMOGLOBIN AND HEMATOCRIT, BLOOD
HCT: 25.1 % — ABNORMAL LOW (ref 36.0–46.0)
Hemoglobin: 8 g/dL — ABNORMAL LOW (ref 12.0–15.0)

## 2020-04-22 NOTE — Evaluation (Signed)
Occupational Therapy Evaluation Patient Details Name: Tamara Mccullough MRN: [id number] DOB: Sep 14, 1955 Today's Date: 04/22/2020    History of Present Illness Pt is a 65 y.o. female s/p L5-S1 decompression and fusion surgery who presented 04/21/20 with worsening back pain and R lower extremity radicular pain failing conservative management. 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 Pt now s/p L5-S1 revision posterior lateral fusion with L5-S2 alar/iliac fixation and aortogram 04/21/20 as pt had some heavy arterial bleeding during procedure. PMH: thyroid disease, seizures, schizophrenia, restless leg syndrome, panic attacks, neuromuscular disorder, obese, insomnia, HTN, dyspnea, depression, chronic back pain, bipolar disorder, asthma, arthritis, and anemia.   Clinical Impression   Patient is s/p back surgery surgery resulting in functional limitations due to the deficits listed below (see OT problem list).Pt with multiple back surgeries and increased admissions over last 6 months ( 10/2019-04/2020) Pt states that she has care worked out to have a neighbor with Alzheimers named "[name]" pt come every 20 minutes to check on her when she gets home. Pt reports brother checks on her daily and daughter orders groceries to be delivered inside the house onto table surface. Pt will be alone at night M-F. 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 Pt currently requires RW and mod (A) for basic transfers. Pt plans to d/c home.  Patient will benefit from skilled OT acutely to increase independence and safety with ADLS to allow discharge CIR.     Follow Up Recommendations  CIR    Equipment Recommendations  None recommended by OT    Recommendations for Other Services Rehab consult     Precautions / Restrictions  Precautions Precautions: Fall;Back Precaution Booklet Issued: No Precaution Comments: A-line; Reviewed precautions verbally, pt able to recall 3/3 Required Braces or Orthoses: Spinal Brace Spinal Brace: Applied in sitting position;Lumbar corset Spinal Brace Comments: Aspen lumbar brace Restrictions Weight Bearing Restrictions: No      Mobility Bed Mobility Overal bed mobility: Needs Assistance Bed Mobility: Rolling;Sidelying to Sit;Sit to Sidelying Rolling: Min assist Sidelying to sit: Min assist;HOB elevated       General bed mobility comments: minA to complete roll with cues for log roll technique. The pt was then able to push up from elevated HOB with minA to elelvate trunk    Transfers Overall transfer level: Needs assistance Equipment used: Rolling walker (2 wheeled) Transfers: Sit to/from [facility] Sit to Stand: Mod assist;Min assist Stand pivot transfers: Min assist       General transfer comment: modA initially to power up to standing. pt with increased assist needed to manage RW with pivoting    Balance Overall balance assessment: Needs assistance Sitting-balance support: Bilateral upper extremity supported;Feet unsupported Sitting balance-Leahy Scale: Fair Sitting balance - Comments: static sitting without UE support when feet supported   Standing balance support: Bilateral upper extremity supported Standing balance-Leahy Scale: Poor Standing balance comment: BUE support on RW                           ADL either performed or assessed with clinical judgement   ADL Overall ADL's : Needs assistance/impaired Eating/Feeding: Set up;Sitting   Grooming: Wash/dry hands;Wash/dry face;Set up;Sitting   Upper Body Bathing: Minimal assistance;Sitting   Lower Body Bathing: Moderate assistance;Sit to/from stand  Upper Body Dressing : Minimal assistance;Sitting   Lower Body Dressing: Moderate assistance;Sit to/from stand   Toilet  Transfer: +2 for physical assistance;Minimal assistance   Toileting- Clothing Manipulation and Hygiene: Moderate assistance;Sit to/from stand Toileting - Clothing Manipulation Details (indicate cue type and reason): holding RW wiping with L hand     Functional mobility during ADLs: Moderate assistance;Rolling walker General ADL Comments: pt was able to progress to bsc and then move with RW     Vision Baseline Vision/History: Wears glasses Wears Glasses: At all times Vision Assessment?: No apparent visual deficits     Perception     Praxis      Pertinent Vitals/Pain Pain Assessment: 0-10 Pain Score: 7  Pain Location: back Pain Descriptors / Indicators: Burning Pain Intervention(s): Limited activity within patient's tolerance;Monitored during session     Hand Dominance Right   Extremity/Trunk Assessment Upper Extremity Assessment Upper Extremity Assessment: Defer to OT evaluation   Lower Extremity Assessment Lower Extremity Assessment: Generalized weakness RLE Deficits / Details: pt able to move LE against gravity when cued, grossly 4/5 LLE Deficits / Details: pt able to move LE against gravity when cued, grossly 4/5   Cervical / Trunk Assessment Cervical / Trunk Assessment: Other exceptions Cervical / Trunk Exceptions: Spinal procedure   Communication Communication Communication: No difficulties   Cognition Arousal/Alertness: Awake/alert Behavior During Therapy: WFL for tasks assessed/performed Overall Cognitive Status: No family/caregiver present to determine baseline cognitive functioning Area of Impairment: Safety/judgement;Problem solving                         Safety/Judgement: Decreased awareness of safety;Decreased awareness of deficits Awareness: Emergent Problem Solving: Slow processing;Decreased initiation;Requires verbal cues General Comments: Pt very tangential, answering different questions than asked at times, but denies hearing deficits.  The pt demos poor safety awareness regarding deficits, assist needed at home, and safe plans regarding arranging assist.   General Comments  refusing SNF - [facility] - her mother and father passed in that SNF    Exercises     Shoulder Instructions      Home Living Family/patient expects to be discharged to:: Private residence Living Arrangements: Alone Available Help at Discharge: Friend(s);Available PRN/intermittently;Family Type of Home: House Home Access: Stairs to enter [employer] of Steps: 3 Entrance Stairs-Rails: Left Home Layout: Two level;Able to live on main level with bedroom/bathroom     Bathroom Shower/Tub: Tub/shower unit;Curtain   Bathroom Toilet: Standard     Home Equipment: Environmental consultant - 2 wheels;Walker - 4 wheels;Cane - single point;Bedside commode;Shower seat;Grab bars - tub/shower;Toilet riser;Other (comment) (medical alert)   Additional Comments: goes upstairs sideways; pt wears emergency call button around neck      Prior Functioning/Environment Level of Independence: Independent with assistive device(s)        Comments: independent utilizing rollator, sits in rollator to clean dishes Has a friend with Alzheimer that stays satruday/ sunday lives behing her ( name [name]) , Vaughan Basta friend pays her to take her doctor visits. brother and sister to give PRN (A) if she calls them        OT Problem List:        OT Treatment/Interventions: Self-care/ADL training;DME and/or AE instruction;Energy conservation    OT Goals(Current goals can be found in the care plan section) Acute Rehab OT Goals Patient Stated Goal: to get better OT Goal Formulation: With patient Time For Goal Achievement: 05/06/20 Potential to Achieve Goals: Good  OT Frequency: Min 2X/week  Barriers to D/C: Decreased caregiver support  pt reports (A) but the assist has Alzheimers. Pt reports sister and brother do anythign they can for her but does not provide a set time they  can (A)       Co-evaluation PT/OT/SLP Co-Evaluation/Treatment: Yes Reason for Co-Treatment: Necessary to address cognition/behavior during functional activity;For patient/therapist safety;To address functional/ADL transfers PT goals addressed during session: Mobility/safety with mobility;Balance        AM-PAC OT "6 Clicks" Daily Activity     Outcome Measure Help from another person eating meals?: A Little Help from another person taking care of personal grooming?: A Little Help from another person toileting, which includes using toliet, bedpan, or urinal?: A Little Help from another person bathing (including washing, rinsing, drying)?: A Little Help from another person to put on and taking off regular upper body clothing?: A Little Help from another person to put on and taking off regular lower body clothing?: A Lot 6 Click Score: 17   End of Session Equipment Utilized During Treatment: Rolling walker;Back brace Nurse Communication: Mobility status;Precautions  Activity Tolerance: Patient tolerated treatment well Patient left: in chair;with call bell/phone within reach;with chair alarm set  OT Visit Diagnosis: Unsteadiness on feet (R26.81);Muscle weakness (generalized) (M62.81);Pain                Time: 5038-8828 OT Time Calculation (min): 39 min Charges:  OT General Charges $OT Visit: 1 Visit OT Evaluation $OT Eval Moderate Complexity: 1 Mod OT Treatments $Self Care/Home Management : 8-22 mins   Brynn, OTR/L  Acute Rehabilitation Services Pager: [phone] Office: [phone] .   Jeri Modena 04/22/2020, 4:19 PM

## 2020-04-22 NOTE — Progress Notes (Signed)
Physical Therapy Treatment Patient Details Name: Tamara Mccullough MRN: [id number] DOB: [birth year] Today's Date: 04/22/2020    History of Present Illness Pt is a 65 y.o. female s/p L5-S1 decompression and fusion surgery who presented 04/21/20 with worsening back pain and R lower extremity radicular pain failing conservative management. 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 Pt now s/p L5-S1 revision posterior lateral fusion with L5-S2 alar/iliac fixation and aortogram 04/21/20 as pt had some heavy arterial bleeding during procedure. PMH: thyroid disease, seizures, schizophrenia, restless leg syndrome, panic attacks, neuromuscular disorder, obese, insomnia, HTN, dyspnea, depression, chronic back pain, bipolar disorder, asthma, arthritis, and anemia.    PT Comments    The pt was seen today by PT/OT for safe progression of OOB mobility at this time. The pt was able to complete bed mobility with minA of 1 for physical assist and second for line management, but was able to progress to minA of 1 for sit-stand transfers and pivot to recliner with RW. The pt presents with generalized weakness in BLE, but was able to stand and take steps without buckling and with minA to manage RW and balance. The pt was limited to short bout of ambulation in the room with RW at this time, but is eager to progress. The pt's course is further challenged by poor safety awareness and understanding of deficits/safety that requires max cues for safety and supervision/assist for all OOB mobility. The pt demos poor problem solving, and awareness of assist necessary at d/c. Will continue to progress with skilled PT, but will benefit from CIR level therapies at d/c to facilitate return to pt goal of return home with support from friends and family.    Follow Up Recommendations  CIR     Equipment Recommendations  None recommended  by PT    Recommendations for Other Services Rehab consult     Precautions / Restrictions Precautions Precautions: Fall;Back Precaution Booklet Issued: No Precaution Comments: A-line; Reviewed precautions verbally, pt able to recall 3/3 Required Braces or Orthoses: Spinal Brace Spinal Brace: Applied in sitting position;Lumbar corset Spinal Brace Comments: Aspen lumbar brace Restrictions Weight Bearing Restrictions: No    Mobility  Bed Mobility Overal bed mobility: Needs Assistance Bed Mobility: Rolling;Sidelying to Sit;Sit to Sidelying Rolling: Min assist Sidelying to sit: Min assist;HOB elevated       General bed mobility comments: minA to complete roll with cues for log roll technique. The pt was then able to push up from elevated HOB with minA to elelvate trunk    Transfers Overall transfer level: Needs assistance Equipment used: Rolling walker (2 wheeled) Transfers: Sit to/from [facility] Sit to Stand: Mod assist;Min assist Stand pivot transfers: Min assist       General transfer comment: modA initially to power up to standing. pt with increased assist needed to manage RW with pivoting  Ambulation/Gait Ambulation/Gait assistance: Min assist Gait Distance (Feet): 5 Feet Assistive device: Rolling walker (2 wheeled) Gait Pattern/deviations: Step-to pattern;Decreased stride length Gait velocity: decreased   General Gait Details: pt with very short steps, increased reliance on BUE on RW. minA to manage RW and to steady with gait         Balance Overall balance assessment: Needs assistance Sitting-balance support: Bilateral upper extremity supported;Feet unsupported Sitting balance-Leahy Scale: Fair Sitting balance - Comments: static sitting without UE support when feet supported   Standing balance support: Bilateral upper extremity supported  Standing balance-Leahy Scale: Poor Standing balance comment: BUE support on RW                             Cognition Arousal/Alertness: Awake/alert Behavior During Therapy: WFL for tasks assessed/performed Overall Cognitive Status: No family/caregiver present to determine baseline cognitive functioning Area of Impairment: Safety/judgement;Problem solving                         Safety/Judgement: Decreased awareness of safety;Decreased awareness of deficits   Problem Solving: Slow processing;Decreased initiation;Requires verbal cues General Comments: Pt very tangential, answering different questions than asked at times, but denies hearing deficits. The pt demos poor safety awareness regarding deficits, assist needed at home, and safe plans regarding arranging assist.         General Comments General comments (skin integrity, edema, etc.): refusing SNF - [facility] - her mother and father passed in that SNF      Pertinent Vitals/Pain Pain Assessment: 0-10 Pain Score: 7  Pain Location: back Pain Descriptors / Indicators: Burning Pain Intervention(s): Limited activity within patient's tolerance;Monitored during session    [place] expects to be discharged to:: Private residence Living Arrangements: Alone Available Help at Discharge: Friend(s);Available PRN/intermittently;Family Type of Home: House Home Access: Stairs to enter Entrance Stairs-Rails: Left Home Layout: Two level;Able to live on main level with bedroom/bathroom Home Equipment: Gilford Rile - 2 wheels;Walker - 4 wheels;Cane - single point;Bedside commode;Shower seat;Grab bars - tub/shower;Toilet riser;Other (comment) (medical alert) Additional Comments: goes upstairs sideways; pt wears emergency call button around neck    Prior Function Level of Independence: Independent with assistive device(s)      Comments: independent utilizing rollator, sits in rollator to clean dishes Has a friend with Alzheimer that stays satruday/ sunday lives behing her ( name [name]) , Vaughan Basta friend pays her  to take her doctor visits. brother and sister to give PRN (A) if she calls them   PT Goals (current goals can now be found in the care plan section) Acute Rehab PT Goals Patient Stated Goal: to get better PT Goal Formulation: With patient Time For Goal Achievement: 05/05/20 Potential to Achieve Goals: Good    Frequency    Min 5X/week           Co-evaluation PT/OT/SLP Co-Evaluation/Treatment: Yes Reason for Co-Treatment: Necessary to address cognition/behavior during functional activity;For patient/therapist safety;To address functional/ADL transfers PT goals addressed during session: Mobility/safety with mobility;Balance        AM-PAC PT "6 Clicks" Mobility   Outcome Measure  Help needed turning from your back to your side while in a flat bed without using bedrails?: A Little Help needed moving from lying on your back to sitting on the side of a flat bed without using bedrails?: A Lot Help needed moving to and from a bed to a chair (including a wheelchair)?: A Lot Help needed standing up from a chair using your arms (e.g., wheelchair or bedside chair)?: A Little Help needed to walk in hospital room?: A Lot Help needed climbing 3-5 steps with a railing? : A Lot 6 Click Score: 14    End of Session Equipment Utilized During Treatment: Gait belt;Back brace Activity Tolerance: Patient tolerated treatment well Patient left: in chair;with chair alarm set;with call bell/phone within reach Nurse Communication: Mobility status PT Visit Diagnosis: Muscle weakness (generalized) (M62.81);History of falling (Z91.81);Difficulty in walking, not elsewhere classified (R26.2);Other symptoms and signs involving the nervous  system (R29.898);Pain Pain - part of body:  (back)     Time: 1017-5102 PT Time Calculation (min) (ACUTE ONLY): 34 min  Charges:  $Gait Training: 8-22 mins                     Karma Ganja, PT, DPT   Acute Rehabilitation Department Pager #: [phone]   Otho Bellows 04/22/2020, 3:51 PM

## 2020-04-22 NOTE — Progress Notes (Signed)
Patient ID: Tamara Mccullough, female   DOB: [birth year], 65 y.o.   MRN: [id number] BP (!) 119/44   Pulse 87   Temp 98.1 F (36.7 C) (Oral)   Resp 19   Ht 5\' 5"  (1.651 m)   Wt 88.9 kg   SpO2 92%   BMI 32.62 kg/m  Alert and oriented x 4 Quite jittery, states this is baseline Moving all extremities well Dressing is dry, intact Doing well

## 2020-04-22 NOTE — Progress Notes (Signed)
[facility]  Subjective  - feels ok numbness in right leg better  Objective (!) 109/53 77 98.4 F (36.9 C) (Oral) (!) 22 95%  Intake/Output Summary (Last 24 hours) at 04/22/2020 0857 Last data filed at 04/22/2020 0700 Gross per 24 hour  Intake 2775.02 ml  Output 1910 ml  Net 865.02 ml   2+ DP pulses Mild ecchymosis right groin  Assessment/Planning: S/p fusion with intraop lumbar bleeding, negative agram Hgb 8 this morning but no tachycardia or hypotension Will recheck CBC in am, if stable no further intervention If continues to trend down or hemodynamic instability may need CTA   Ruta Hinds 04/22/2020 8:57 AM --  Laboratory Lab Results: Recent Labs    04/19/20 1500 04/21/20 1005 04/21/20 1728 04/22/20 0656  WBC 6.7  --   --   --   HGB 13.5   < > 9.6* 8.0*  HCT 43.4   < > 29.2* 25.1*  PLT 264  --   --   --    < > = values in this interval not displayed.   BMET Recent Labs    04/19/20 1500 04/21/20 1005 04/21/20 1039  NA 138 142 145  K 3.4* 4.0 3.0*  CL 106  --   --   CO2 23  --   --   GLUCOSE 96  --   --   BUN 13  --   --   CREATININE 0.78  --   --   CALCIUM 8.2*  --   --     COAG Lab Results  Component Value Date   INR 0.9 03/24/2017   INR 0.97 07/28/2013   No results found for: PTT

## 2020-04-23 LAB — CBC
HCT: 27.2 % — ABNORMAL LOW (ref 36.0–46.0)
Hemoglobin: 8.5 g/dL — ABNORMAL LOW (ref 12.0–15.0)
MCH: 26.5 pg (ref 26.0–34.0)
MCHC: 31.3 g/dL (ref 30.0–36.0)
MCV: 84.7 fL (ref 80.0–100.0)
Platelets: 176 10*3/uL (ref 150–400)
RBC: 3.21 MIL/uL — ABNORMAL LOW (ref 3.87–5.11)
RDW: 16.5 % — ABNORMAL HIGH (ref 11.5–15.5)
WBC: 8.3 10*3/uL (ref 4.0–10.5)
nRBC: 0 % (ref 0.0–0.2)

## 2020-04-23 NOTE — Progress Notes (Signed)
Patient transferred to 4NP at this time. Report given to Agricultural consultant. All belongings transferred with patient including pocket book, cell phone, clothes, tooth brush, and Aspen brace. VSS  Blood pressure 112/62, pulse 75, temperature 99.2 F (37.3 C), temperature source Oral, resp. rate 14, height 5\' 5"  (1.651 m), weight 88.9 kg, SpO2 94 %.

## 2020-04-23 NOTE — Progress Notes (Signed)
Patient ID: Tamara Mccullough, female   DOB: April 19, 1955, 65 y.o.   MRN: [id number] BP 124/61   Pulse 85   Temp 99 F (37.2 C) (Oral)   Resp 16   Ht 5\' 5"  (1.651 m)   Wt 88.9 kg   SpO2 95%   BMI 32.62 kg/m  Alert and oriented x 4, speech is clear and fluent Moving all extremities well Dressings dry, intact Will transfer to floor

## 2020-04-23 NOTE — Plan of Care (Signed)
  Problem: Education: Goal: Knowledge of General Education information will improve Description: Including pain rating scale, medication(s)/side effects and non-pharmacologic comfort measures Outcome: Completed/Met   Problem: Health Behavior/Discharge Planning: Goal: Ability to manage health-related needs will improve Outcome: Completed/Met   Problem: Clinical Measurements: Goal: Ability to maintain clinical measurements within normal limits will improve Outcome: Progressing Goal: Will remain free from infection Outcome: Adequate for Discharge Goal: Diagnostic test results will improve Outcome: Progressing   Problem: Coping: Goal: Level of anxiety will decrease Outcome: Progressing   Problem: Elimination: Goal: Will not experience complications related to bowel motility Outcome: Not Progressing Goal: Will not experience complications related to urinary retention Outcome: Completed/Met   Problem: Pain Managment: Goal: General experience of comfort will improve Outcome: Not Progressing   Problem: Safety: Goal: Ability to remain free from injury will improve Outcome: Adequate for Discharge   Problem: Skin Integrity: Goal: Risk for impaired skin integrity will decrease Outcome: Adequate for Discharge

## 2020-04-23 NOTE — Progress Notes (Signed)
Inpatient Rehabilitation Admissions Coordinator    I will place rehab consult per protocol for full rehab assessment.  Danne Baxter, RN, MSN Rehab Admissions Coordinator [phone] 04/23/2020 4:48 PM

## 2020-04-23 NOTE — Plan of Care (Signed)
Patient was able to work with staff to begin ambulation and seems excited about her progress so far. Pt continues to have burning/tingling/numbness in RLE which was happening prior to admission. She has equal sensation in all extremities and is able to move all extremities as well.   Pt remains free from infection, nursing staff continues to monitor and help control her surgical pain and anxiety. Pt is calmly resting, cooperative and quite the entertainer.   Will continue to monitor and maintain patient safety.

## 2020-04-23 NOTE — Progress Notes (Addendum)
[facility] [place]  Subjective  - Doing well without new complaints.   Objective 90/76 87 98.3 F (36.8 C) (Oral) 12 93%  Intake/Output Summary (Last 24 hours) at 04/23/2020 0903 Last data filed at 04/23/2020 0800 Gross per 24 hour  Intake 360 ml  Output 1200 ml  Net -840 ml    Palpable pedal and radial pulses, abdomin soft HGB improved 8.5 asymptomatic with ambulation in the room. Ecchymosis right groin mild.  Assessment/Planning: S/p fusion with intraop lumbar bleeding, negative angiogram HGB 8.5 this am and she is asymptomatic  Hemodynamically stable from a vascular point of view.  Roxy Horseman 04/23/2020 9:03 AM -- Agree with above Can go home from our standpoint Will sign off  Ruta Hinds, MD [facility] [place]: [phone]  Laboratory Lab Results: Recent Labs    04/22/20 0656 04/23/20 0152  WBC  --  8.3  HGB 8.0* 8.5*  HCT 25.1* 27.2*  PLT  --  176   BMET Recent Labs    04/21/20 1005 04/21/20 1039  NA 142 145  K 4.0 3.0*    COAG Lab Results  Component Value Date   INR 0.9 03/24/2017   INR 0.97 07/28/2013   No results found for: PTT

## 2020-04-24 ENCOUNTER — Encounter (HOSPITAL_COMMUNITY): Payer: Self-pay | Admitting: Neurosurgery

## 2020-04-24 NOTE — Progress Notes (Signed)
Physical Therapy Treatment Patient Details Name: Tamara Mccullough MRN: [id number] DOB: 1955-05-04 Today's Date: 04/24/2020    History of Present Illness Pt is a 65 y.o. female s/p L5-S1 decompression and fusion surgery who presented 04/21/20 with worsening back pain and R lower extremity radicular pain failing conservative management. 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 Pt now s/p L5-S1 revision posterior lateral fusion with L5-S2 alar/iliac fixation and aortogram 04/21/20 as pt had some heavy arterial bleeding during procedure. PMH: thyroid disease, seizures, schizophrenia, restless leg syndrome, panic attacks, neuromuscular disorder, obese, insomnia, HTN, dyspnea, depression, chronic back pain, bipolar disorder, asthma, arthritis, and anemia.    PT Comments    The pt was seen for progression of mobility, ambulation, and continued education regarding spinal precautions and use of brace. The pt was able to demo significant progress with ambulation distance and reduced assist needed for sit-stand transfers. The pt continues to present with decreased insight to deficits and safety awareness, requiring cues for sequencing and technique throughout the session. The pt was agreeable to all education this session, and will continue to benefit from skilled PT to progress functional mobility and safety with independence to allow for eventual return to prior level of independence and pt goal of eventual return home where she lives alone. The pt is highly motivated, and will benefit from CIR level therapies to facilitate safe return to independence.     Follow Up Recommendations  CIR     Equipment Recommendations  None recommended by PT    Recommendations for Other Services       Precautions / Restrictions Precautions Precautions: Fall;Back Precaution Booklet Issued: No Precaution Comments:  Reviewed precautions verbally, pt able to recall 3/3 Required Braces or Orthoses: Spinal Brace Spinal Brace: Applied in sitting position;Lumbar corset Spinal Brace Comments: Aspen lumbar brace Restrictions Weight Bearing Restrictions: No    Mobility  Bed Mobility Overal bed mobility: Needs Assistance Bed Mobility: Rolling;Sidelying to Sit;Sit to Sidelying Rolling: Min guard Sidelying to sit: Min guard;HOB elevated Supine to sit: Min guard   Sit to sidelying: Mod assist General bed mobility comments: minA    Transfers Overall transfer level: Needs assistance Equipment used: Rolling walker (2 wheeled) Transfers: Sit to/from [facility] Sit to Stand: Min guard Stand pivot transfers: Min guard       General transfer comment: cues for hand placement but able to power up  Ambulation/Gait Ambulation/Gait assistance: Min assist;Min guard Gait Distance (Feet): 55 Feet Assistive device: Rolling walker (2 wheeled) Gait Pattern/deviations: Step-through pattern;Decreased stride length;Trunk flexed Gait velocity: decreased   General Gait Details: Short steps with increased reliance on BUE and slight lateral sway with gait. cues for use of RW, few instances of miNA to steady       Balance Overall balance assessment: Needs assistance Sitting-balance support: Bilateral upper extremity supported Sitting balance-Leahy Scale: Good     Standing balance support: Bilateral upper extremity supported Standing balance-Leahy Scale: Good Standing balance comment: bil ue on rw but able to release to wash glasses with widen base of support                            Cognition Arousal/Alertness: Awake/alert Behavior During Therapy: WFL for tasks assessed/performed Overall Cognitive Status: No family/caregiver present to determine baseline cognitive functioning Area of Impairment: Safety/judgement;Problem solving;Awareness  Safety/Judgement: Decreased awareness of safety;Decreased awareness of deficits Awareness: Emergent Problem Solving: Slow processing;Decreased initiation;Requires verbal cues General Comments: Pt with improved ability to maintain conversation at this time, improved memory in regards to precautions, and able to demo some problem solving with cues for application of brace. The pt continues to demo poor insight to level of assist needed given deficits      Exercises Other Exercises Other Exercises: x5 sit-stand from EOB, no use of AD to steady in standing    General Comments General comments (skin integrity, edema, etc.): refusing SNF- pt states "i am going to go to your rehab here" pt mentions 3 additional people to assist upon d/c and not just [name]      Pertinent Vitals/Pain Pain Assessment: Faces (Simultaneous filing. User may not have seen previous data.) Faces Pain Scale: Hurts little more Pain Location: bottom after having BM Pain Descriptors / Indicators: Discomfort;Sore Pain Intervention(s): Limited activity within patient's tolerance;Monitored during session           PT Goals (current goals can now be found in the care plan section) Acute Rehab PT Goals Patient Stated Goal: to get better PT Goal Formulation: With patient Time For Goal Achievement: 05/05/20 Potential to Achieve Goals: Good Progress towards PT goals: Progressing toward goals    Frequency    Min 5X/week      PT Plan Current plan remains appropriate    Co-evaluation PT/OT/SLP Co-Evaluation/Treatment: Yes Reason for Co-Treatment: For patient/therapist safety;To address functional/ADL transfers PT goals addressed during session: Mobility/safety with mobility;Balance;Proper use of DME        AM-PAC PT "6 Clicks" Mobility   Outcome Measure  Help needed turning from your back to your side while in a flat bed without using bedrails?: A Little Help needed moving from lying on your back to sitting  on the side of a flat bed without using bedrails?: A Little Help needed moving to and from a bed to a chair (including a wheelchair)?: A Lot Help needed standing up from a chair using your arms (e.g., wheelchair or bedside chair)?: A Little Help needed to walk in hospital room?: A Little Help needed climbing 3-5 steps with a railing? : A Lot 6 Click Score: 16    End of Session Equipment Utilized During Treatment: Gait belt;Back brace Activity Tolerance: Patient tolerated treatment well Patient left: in bed;with bed alarm set;with call bell/phone within reach Nurse Communication: Mobility status PT Visit Diagnosis: Muscle weakness (generalized) (M62.81);History of falling (Z91.81);Difficulty in walking, not elsewhere classified (R26.2);Other symptoms and signs involving the nervous system (R29.898);Pain Pain - part of body:  (back)     Time: 8299-3716 PT Time Calculation (min) (ACUTE ONLY): 24 min  Charges:  $Gait Training: 8-22 mins                     Karma Ganja, PT, DPT   Acute Rehabilitation Department Pager #: [phone]   Otho Bellows 04/24/2020, 12:26 PM

## 2020-04-24 NOTE — Progress Notes (Signed)
Postop day 3.  Overall doing reasonably well.  Patient complains of incisional pain.  She is not having any radicular pain.  She is mobilizing slowly with therapy.  He denies any abdominal pain.  She have bowel movement this morning.  She is afebrile.  Her vital signs are stable.  Urine output is good.  She is awake and alert.  She is oriented and appropriate.  Abdomen is soft.  Motor and sensory examination are grossly intact.  Wound clean and dry.  Overall progressing well.  Continue efforts at mobilization.  Work towards discharge plan either with inpatient rehab versus skilled nursing facility closer to home.

## 2020-04-24 NOTE — Progress Notes (Signed)
Occupational Therapy Treatment Patient Details Name: Tamara Mccullough MRN: [id number] DOB: 05-24-1955 Today's Date: 04/24/2020    History of present illness Pt is a 65 y.o. female s/p L5-S1 decompression and fusion surgery who presented 04/21/20 with worsening back pain and R lower extremity radicular pain failing conservative management. 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 Pt now s/p L5-S1 revision posterior lateral fusion with L5-S2 alar/iliac fixation and aortogram 04/21/20 as pt had some heavy arterial bleeding during procedure. PMH: thyroid disease, seizures, schizophrenia, restless leg syndrome, panic attacks, neuromuscular disorder, obese, insomnia, HTN, dyspnea, depression, chronic back pain, bipolar disorder, asthma, arthritis, and anemia.   OT comments  Pt progressed to ability to complete mobility with dependence on bed rail and slight bed elevations this session without physical (A). Pt don brace with cues for positioning and adjusted to a size large from xlarge. Pt states "that does feel so much better" pt sink level grooming task and taking care of cleaning glasses. Pt educated on toilet aid. Recommendation for CIR for MOD I in 7 days.    Follow Up Recommendations  CIR    Equipment Recommendations  None recommended by OT    Recommendations for Other Services Rehab consult    Precautions / Restrictions Precautions Precautions: Fall;Back Precaution Comments: able to verbalize 3 out 3 precautions Required Braces or Orthoses: Spinal Brace Spinal Brace: Applied in sitting position;Lumbar corset Spinal Brace Comments: Aspen lumbar brace       Mobility Bed Mobility Overal bed mobility: Needs Assistance Bed Mobility: Rolling;Supine to Sit Rolling: Min guard Sidelying to sit: Min guard Supine to sit: Min guard     General bed mobility comments: hob 20 degrees with  use of bed rail bil UE with correct alignment    Transfers Overall transfer level: Needs assistance Equipment used: Rolling walker (2 wheeled) Transfers: Sit to/from [facility] Sit to Stand: Min guard Stand pivot transfers: Min guard       General transfer comment: cues for hand placement but able to power up    Balance Overall balance assessment: Needs assistance Sitting-balance support: Bilateral upper extremity supported Sitting balance-Leahy Scale: Good     Standing balance support: Bilateral upper extremity supported Standing balance-Leahy Scale: Good Standing balance comment: bil ue on rw but able to release to wash glasses with widen base of support                           ADL either performed or assessed with clinical judgement   ADL Overall ADL's : Needs assistance/impaired     Grooming: Supervision/safety;Standing Grooming Details (indicate cue type and reason): washing glasses at sink - glasses under soap dispenser, rubbing soap on lens under water and using paper towel on lens to dry         Upper Body Dressing : Min guard;Sitting Upper Body Dressing Details (indicate cue type and reason): min cues for brace application x2 as pt first attempted to don upside down then second attempt attempting to place R over left flap without it securing. Pt given cue wait something is wrong ith brace and pt state "oh its upside down " when she visually looked at the aspen label. pt able to self correct     Toilet Transfer: Min guard;RW   Toileting- Water quality scientist and Hygiene: Moderate assistance Toileting - Clothing Manipulation Details (indicate cue type  and reason): pt reports prior to admission using a back scratcher with wash cloth prior, pt educated on use of toilet aide     Functional mobility during ADLs: Min guard;Rolling walker General ADL Comments: pt educated not to lift the RW but to walk like  pushing grocery cart      Vision       Perception     Praxis      Cognition Arousal/Alertness: Awake/alert Behavior During Therapy: WFL for tasks assessed/performed Overall Cognitive Status: Within Functional Limits for tasks assessed                                          Exercises     Shoulder Instructions       General Comments refusing SNF- pt states "i am going to go to your rehab here" pt mentions 3 additional people to assist upon d/c and not just [name]    Pertinent Vitals/ Pain       Pain Assessment: No/denies pain  Home Living                                          Prior Functioning/Environment              Frequency  Min 2X/week        Progress Toward Goals  OT Goals(current goals can now be found in the care plan section)  Progress towards OT goals: Progressing toward goals  Acute Rehab OT Goals Patient Stated Goal: to go to your rehab here i am not going to no nursing home OT Goal Formulation: With patient Time For Goal Achievement: 05/06/20 Potential to Achieve Goals: Good ADL Goals Pt Will Perform Lower Body Dressing: with supervision;sit to/from stand;with adaptive equipment Pt Will Transfer to Toilet: with min guard assist;ambulating;bedside commode Additional ADL Goal #1: pt will complete bed mobility supervision level Additional ADL Goal #2: pt will don doff brace mod I as precursor to [place] Discharge plan remains appropriate    Co-evaluation                 AM-PAC OT "6 Clicks" Daily Activity     Outcome Measure   Help from another person eating meals?: A Little Help from another person taking care of personal grooming?: A Little Help from another person toileting, which includes using toliet, bedpan, or urinal?: A Little Help from another person bathing (including washing, rinsing, drying)?: A Little Help from another person to put on and taking off regular upper body clothing?: A Little Help from  another person to put on and taking off regular lower body clothing?: A Lot 6 Click Score: 17    End of Session Equipment Utilized During Treatment: Back brace;Rolling walker  OT Visit Diagnosis: Unsteadiness on feet (R26.81);Muscle weakness (generalized) (M62.81);Pain   Activity Tolerance Patient tolerated treatment well   Patient Left Other (comment) (up with PT)   Nurse Communication Mobility status;Precautions        Time: 0962-8366 OT Time Calculation (min): 14 min  Charges: OT General Charges $OT Visit: 1 Visit OT Treatments $Self Care/Home Management : 8-22 mins   Brynn, OTR/L  Acute Rehabilitation Services Pager: [phone] Office: [phone] .    Jeri Modena 04/24/2020, 12:11 PM

## 2020-04-24 NOTE — Progress Notes (Signed)
Inpatient Rehabilitation Admissions Coordinator  Inpatient rehab consult received. I met with patient at bedside for rehab assessment. I discussed that it is unlikely for her [place] [employer] to approve admit for this diagnosis. She prefers to discharge directly home at this time.   Danne Baxter, RN, MSN Rehab Admissions Coordinator [phone] 04/24/2020 1:57 PM

## 2020-04-25 MED ORDER — DIAZEPAM 5 MG PO TABS: 5.0000 mg | ORAL_TABLET | Freq: Four times a day (QID) | ORAL | 0 refills | Status: AC | PRN

## 2020-04-25 MED ORDER — OXYCODONE HCL 10 MG PO TABS: 10.0000 mg | ORAL_TABLET | ORAL | 0 refills | Status: AC | PRN

## 2020-04-25 MED FILL — Heparin Sodium (Porcine) Inj 1000 Unit/ML: INTRAMUSCULAR | Qty: 30 | Status: AC

## 2020-04-25 MED FILL — Sodium Chloride IV Soln 0.9%: INTRAVENOUS | Qty: 1000 | Status: AC

## 2020-04-25 NOTE — TOC Transition Note (Signed)
Transition of Care (TOC) - CM/SW Discharge Note Marvetta Gibbons RN,BSN Transitions of Care Unit 4NP (non trauma) - RN Case Manager See Treatment Team for direct Phone #  Patient Details  Name: Tamara Mccullough MRN: [id number] Date of Birth: 04/08/1955  Transition of Care [facility]) CM/SW Contact:  Dawayne Delfina, RN Phone Number: 04/25/2020, 1:54 PM   Clinical Narrative:    Plan for pt to transition home today, verbal order for outpt PT/OT received per MD. Pt does not want to pursue CIR and desires to return home and go to Outpt- preferred location per pt if for [facility] location in [place].  Per pt she has RW and 3n1 at home, no needed DME at this time.  Pt states she will have transportation home.   Referral made to [facility] Out[facility] therapy in [place] for outpt PT/OT via epic.   Final next level of care: OP Rehab Barriers to Discharge: No Barriers Identified   Patient Goals and CMS Choice Patient states their goals for this hospitalization and ongoing recovery are:: return home      Discharge Placement                 Home      Discharge Plan and Services   Discharge Planning Services: CM Consult            DME Arranged: N/A DME Agency: NA       HH Arranged: NA HH Agency: NA        Social Determinants of Health (SDOH) Interventions     Readmission Risk Interventions No flowsheet data found.

## 2020-04-25 NOTE — Care Management Important Message (Signed)
Important Message  Patient Details  Name: Tamara Mccullough MRN: [id number] Date of Birth: May 29, 1955   Medicare Important Message Given:  Yes  Patient left prior to IM delivery.  IM mailed to the patient home address.   Ambert Virrueta 04/25/2020, 3:45 PM

## 2020-04-25 NOTE — Progress Notes (Signed)
Pt given all d/c education and all questions answered. No IV to remove. No printed prescriptions to give or equipment to deliver. Pt taken to the car with all belongings.

## 2020-04-25 NOTE — Progress Notes (Signed)
Physical Therapy Treatment Patient Details Name: Tamara Mccullough MRN: [id number] DOB: 09-21-1955 Today's Date: 04/25/2020    History of Present Illness Pt is a 65 y.o. female s/p L5-S1 decompression and fusion surgery who presented 04/21/20 with worsening back pain and R lower extremity radicular pain failing conservative management. 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 Pt now s/p L5-S1 revision posterior lateral fusion with L5-S2 alar/iliac fixation and aortogram 04/21/20 as pt had some heavy arterial bleeding during procedure. PMH: thyroid disease, seizures, schizophrenia, restless leg syndrome, panic attacks, neuromuscular disorder, obese, insomnia, HTN, dyspnea, depression, chronic back pain, bipolar disorder, asthma, arthritis, and anemia.    PT Comments    Pt is making great progress towards her goals, performing all bed mobility, transfers, gait on even surfaces with RW, and stairs with rail with only min guard-supervision this date without overt LOB. However, she does display trunk sway and generalized weakness, especially in her lower extremities, particularly her R, that impact her speed and ease with all functional mobility. She is at risk for falls and has been educated on safe car transfers and guarding from someone with all mobility, especially on the stairs. Will continue to follow acutely. Changed d/c recommendations to Outpatient PT as pt reports she will have necessary level of care available at home. Spinal precautions handout provided and reviewed.     Follow Up Recommendations  Outpatient PT;Supervision for mobility/OOB     Equipment Recommendations  None recommended by PT    Recommendations for Other Services       Precautions / Restrictions Precautions Precautions: Fall;Back Precaution Booklet Issued: Yes (comment) Precaution Comments: Reviewed precautions,  pt able to recall 3/3 without cues Required Braces or Orthoses: Spinal Brace Spinal Brace: Applied in sitting position;Lumbar corset (by pt independently) Spinal Brace Comments: Aspen lumbar brace Restrictions Weight Bearing Restrictions: No    Mobility  Bed Mobility Overal bed mobility: Needs Assistance Bed Mobility: Rolling;Sidelying to Sit Rolling: Min guard Sidelying to sit: Min guard       General bed mobility comments: Bed flat with rails down to simulate home. Pt needing repeated cues to roll first prior to trying to sit up. Pt rolling with min guard and requiring increased time to push up from sidelying on R UE to sit EOB.    Transfers Overall transfer level: Needs assistance Equipment used: Rolling walker (2 wheeled) Transfers: Sit to/from Stand Sit to Stand: Supervision         General transfer comment: Pt able to come to stand without LOB, supervision for safety.  Ambulation/Gait Ambulation/Gait assistance: Min Gaffer (Feet): 125 Feet Assistive device: Rolling walker (2 wheeled) Gait Pattern/deviations: Step-through pattern;Decreased stride length;Trunk flexed;Decreased step length - left;Decreased stance time - right Gait velocity: decreased Gait velocity interpretation: <1.8 ft/sec, indicate of risk for recurrent falls General Gait Details: Pt ambulating without overt LOB, but mild unsteadiness noted. Decreased bil step length, especially with L, likely due to pt's reported R leg weakness and decreased R stance time. Cues to correct, min success.   Stairs Stairs: Yes Stairs assistance: Min guard Stair Management: One rail Left;Step to pattern;Sideways Number of Stairs: 4 General stair comments: Ascending with bil UEs on L rail and pt turned sideways towards rail, leading up with L foot (turning slightly to allow L foot to lead), with increased time and effort but no overt LOB, min guard for safety. Pt descending sideways  facing R rail  with bil UEs on rail, leading with L despite cues to lead down with R instead, no overt LOB but extra time and effort, min guard for safety.   Wheelchair Mobility    Modified Rankin (Stroke Patients Only)       Balance Overall balance assessment: Needs assistance Sitting-balance support: No upper extremity supported Sitting balance-Leahy Scale: Good     Standing balance support: No upper extremity supported Standing balance-Leahy Scale: Good Standing balance comment: Bil UEs on RW with mobility, but standing at sink and reaching off BOS min-moderately without overt LOB but mild trunk sway, supervision.                            Cognition Arousal/Alertness: Awake/alert Behavior During Therapy: WFL for tasks assessed/performed Overall Cognitive Status: No family/caregiver present to determine baseline cognitive functioning                                 General Comments: Pt with improved recall of precautions and memory. Pt with improved insight into deficits and awareness of safety concerns.      Exercises      General Comments General comments (skin integrity, edema, etc.): Educated pt on safe care transfers. Pt educated on having someone guard her with all standing mobility and stairs.      Pertinent Vitals/Pain Pain Assessment: 0-10 Pain Score: 5  Pain Location: back; surgical site Pain Descriptors / Indicators: Discomfort;Sore;Operative site guarding Pain Intervention(s): Limited activity within patient's tolerance;Monitored during session;Repositioned    Home Living                      Prior Function            PT Goals (current goals can now be found in the care plan section) Acute Rehab PT Goals Patient Stated Goal: to go home PT Goal Formulation: With patient Time For Goal Achievement: 05/05/20 Potential to Achieve Goals: Good Progress towards PT goals: Progressing toward goals    Frequency    Min  5X/week      PT Plan Discharge plan needs to be updated    Co-evaluation PT/OT/SLP Co-Evaluation/Treatment: Yes            AM-PAC PT "6 Clicks" Mobility   Outcome Measure  Help needed turning from your back to your side while in a flat bed without using bedrails?: A Little Help needed moving from lying on your back to sitting on the side of a flat bed without using bedrails?: A Little Help needed moving to and from a bed to a chair (including a wheelchair)?: A Little Help needed standing up from a chair using your arms (e.g., wheelchair or bedside chair)?: A Little Help needed to walk in hospital room?: A Little Help needed climbing 3-5 steps with a railing? : A Little 6 Click Score: 18    End of Session Equipment Utilized During Treatment: Gait belt;Back brace Activity Tolerance: Patient tolerated treatment well Patient left: with call bell/phone within reach;in chair;with chair alarm set Nurse Communication: Mobility status;Other (comment) (reports of nausea) PT Visit Diagnosis: Muscle weakness (generalized) (M62.81);History of falling (Z91.81);Difficulty in walking, not elsewhere classified (R26.2);Other symptoms and signs involving the nervous system (R29.898);Pain;Other abnormalities of gait and mobility (R26.89);Unsteadiness on feet (R26.81) Pain - part of body:  (back)     Time: 5852-7782 PT Time  Calculation (min) (ACUTE ONLY): 22 min  Charges:  $Therapeutic Activity: 8-22 mins                     Moishe Spice, PT, DPT Acute Rehabilitation Services  Pager: [phone] Office: [place] 04/25/2020, 11:41 AM

## 2020-04-25 NOTE — Discharge Instructions (Signed)

## 2020-04-25 NOTE — Discharge Summary (Signed)
Physician Discharge Summary  Patient ID: Tamara Mccullough MRN: [id number] DOB/AGE: [id number] y.o.  Admit date: 04/21/2020 Discharge date: 04/25/2020  Admission Diagnoses:  Discharge Diagnoses:  Active Problems:   Lumbar pseudoarthrosis   Pseudoarthrosis of lumbar spine   Discharged Condition: good  Hospital Course: Patient mated to the hospital for evaluation and treatment of a lumbosacral pseudoarthrosis with hardware failure.  Patient underwent revision lumbosacral fusion with extension of her fusion to L4 and down to her pelvis with pelvic fixation.  During the surgery there was concern as to whether the patient had a vascular injury.  She underwent a subsequent arteriogram and evaluation by vascular surgery which showed no evidence of significant vascular injury.  Patient was observed in the intensive care unit and her hemoglobin levels stabilized and began to improve.  She shows no signs of active bleeding.  She is progressing with therapy.  She feels ready for discharge home.  Her preoperative severe back and right lower extremity pain is much improved.  She is standing ambulating and voiding with minimal assistance.  She has normal bowel function.  Consults:   Significant Diagnostic Studies:   Treatments:   Discharge Exam: Blood pressure 99/68, pulse 78, temperature 98.3 F (36.8 C), resp. rate 18, height 5\' 5"  (1.651 m), weight 88.9 kg, SpO2 95 %. Awake and alert.  Oriented and appropriate.  Motor and sensory function intact.  Wound clean and dry.  Abdomen soft.  Chest benign.  Disposition: Discharge disposition: 01-Home or Self Care        Allergies as of 04/25/2020      Reactions   Codeine Itching, Nausea And Vomiting, Rash, Other (See Comments)   Hydrocodone Itching   Melatonin Other (See Comments)   seizure   Ambien [zolpidem Tartrate] Other (See Comments)   forgetful   Eszopiclone Rash   Lansoprazole Palpitations   Mirapex [pramipexole] Other (See  Comments)   Mouth dry   Motrin [ibuprofen] Nausea And Vomiting   Prilosec [omeprazole] Palpitations, Other (See Comments)   Makes heart beat fast.   Trazodone And Nefazodone Other (See Comments)   fainted      Medication List    STOP taking these medications   oxyCODONE-acetaminophen 5-325 MG tablet Commonly known as: PERCOCET/ROXICET     TAKE these medications   acetaminophen 500 MG tablet Commonly known as: TYLENOL Take 1,000 mg by mouth every 4 (four) hours as needed for moderate pain.   ALPRAZolam 0.25 MG tablet Commonly known as: XANAX Take 0.25 mg by mouth at bedtime as needed for sleep.   aspirin EC 81 MG tablet Take 81 mg by mouth daily.   busPIRone 10 MG tablet Commonly known as: BUSPAR Take 2 tablets (20 mg total) by mouth 2 (two) times daily.   cyclobenzaprine 5 MG tablet Commonly known as: FLEXERIL Take 1 tablet (5 mg total) by mouth 3 (three) times daily as needed for muscle spasms.   diazepam 5 MG tablet Commonly known as: VALIUM Take 1-2 tablets (5-10 mg total) by mouth every 6 (six) hours as needed for muscle spasms.   diclofenac sodium 1 % Gel Commonly known as: VOLTAREN Apply 4 g topically 4 (four) times daily. What changed:   when to take this  reasons to take this   FLUoxetine 40 MG capsule Commonly known as: PROZAC Take 1 capsule (40 mg total) by mouth daily.   gabapentin 300 MG capsule Commonly known as: NEURONTIN Take 300 mg by mouth at bedtime.   hydrochlorothiazide 12.5  MG capsule Commonly known as: Microzide Take 1 capsule (12.5 mg total) by mouth daily.   hydrOXYzine 50 MG tablet Commonly known as: ATARAX/VISTARIL Take 1 tablet (50 mg total) by mouth 3 (three) times daily as needed. What changed:   how much to take  when to take this   lamoTRIgine 100 MG tablet Commonly known as: LAMICTAL Take 1 tablet (100 mg total) by mouth daily.   Oxycodone HCl 10 MG Tabs Take 1 tablet (10 mg total) by mouth every 3 (three)  hours as needed for severe pain ((score 7 to 10)).   QUEtiapine 100 MG tablet Commonly known as: SEROQUEL Take 1 tablet (100 mg total) by mouth at bedtime.   traMADol 50 MG tablet Commonly known as: ULTRAM Take 1-2 tablets (50-100 mg total) by mouth every 6 (six) hours as needed for severe pain.   VITAMIN B-12 PO Take 1 tablet by mouth daily.   VITAMIN D PO Take 1 tablet by mouth daily.            Durable Medical Equipment  (From admission, onward)         Start     Ordered   04/21/20 1500  DME Walker rolling  Once       Question:  Patient needs a walker to treat with the following condition  Answer:  Lumbar pseudoarthrosis   04/21/20 1500   04/21/20 1500  DME 3 n 1  Once        04/21/20 1500          Follow-up Information    [facility] Follow up.   Specialty: Rehabilitation Why: referral made for outpt PT/OT eval and treat- they will contact you to follow up and schedule Contact information: [address] [id number] [place]              Signed: [place] 04/25/2020, 11:06 AM

## 2020-04-25 NOTE — Care Management Important Message (Signed)
Important Message  Patient Details  Name: Tamara Mccullough MRN: [id number] Date of Birth: 1955-06-04   Medicare Important Message Given:  Yes     [name] Montine Circle 04/25/2020, 3:43 PM

## 2020-04-27 LAB — TYPE AND SCREEN
ABO/RH(D): A NEG
Antibody Screen: NEGATIVE
Unit division: 0
Unit division: 0

## 2020-04-27 LAB — BPAM RBC
Blood Product Expiration Date: 202204112359
Blood Product Expiration Date: 202204122359
ISSUE DATE / TIME: 202203180957
ISSUE DATE / TIME: 202203180957
Unit Type and Rh: 600
Unit Type and Rh: 600

## 2020-05-02 ENCOUNTER — Ambulatory Visit: Payer: Medicare HMO | Admitting: Physical Therapy
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
## 2020-05-09 ENCOUNTER — Other Ambulatory Visit: Payer: Self-pay

## 2020-05-09 ENCOUNTER — Encounter: Payer: Self-pay | Admitting: Physical Therapy

## 2020-05-09 ENCOUNTER — Ambulatory Visit: Payer: Medicare HMO | Attending: Neurosurgery | Admitting: Physical Therapy

## 2020-05-09 DIAGNOSIS — M6281 Muscle weakness (generalized): Secondary | ICD-10-CM | POA: Insufficient documentation

## 2020-05-09 DIAGNOSIS — G8929 Other chronic pain: Secondary | ICD-10-CM | POA: Insufficient documentation

## 2020-05-09 DIAGNOSIS — M545 Low back pain, unspecified: Secondary | ICD-10-CM | POA: Diagnosis present

## 2020-05-09 NOTE — Therapy (Signed)
Seizure ([place]) 12/13/2016  . Lumbar disc disorder 11/07/2016  . Gastric bypass status for obesity 08/22/2016  . Seizures ([place]) 09/01/2015  . Lumbar foraminal stenosis 07/17/2015  . OA (osteoarthritis) of knee 07/17/2015  . Alcohol use disorder, severe, dependence ([place])   . Insomnia due  to anxiety and fear 12/20/2013  . Cervical stenosis of spinal canal 11/09/2013  . RLS (restless legs syndrome) 08/09/2013  . Multinodular goiter 06/09/2013  . Alcoholism in remission ([place]) 03/15/2013  . Essential hypertension, benign 05/20/2012  . GAD (generalized anxiety disorder) 05/20/2012  . Hyperlipidemia with target LDL less than 100 05/20/2012  . Mood disorder in conditions classified elsewhere 05/20/2012  . Anemia, vitamin B12 deficiency 07/06/2009  . Obesity 06/29/2009  . PERSONAL HX COLONIC POLYPS 06/29/2009    Tamara Mccullough, [place] MPT 05/09/2020, 2:47 PM  [facility] [address] [place], [place], [address] Phone: [phone]   Fax:  [phone]  Name: Tamara Mccullough MRN: [id number] Date of Birth: 1955-08-08  Seizure ([place]) 12/13/2016  . Lumbar disc disorder 11/07/2016  . Gastric bypass status for obesity 08/22/2016  . Seizures ([place]) 09/01/2015  . Lumbar foraminal stenosis 07/17/2015  . OA (osteoarthritis) of knee 07/17/2015  . Alcohol use disorder, severe, dependence ([place])   . Insomnia due  to anxiety and fear 12/20/2013  . Cervical stenosis of spinal canal 11/09/2013  . RLS (restless legs syndrome) 08/09/2013  . Multinodular goiter 06/09/2013  . Alcoholism in remission ([place]) 03/15/2013  . Essential hypertension, benign 05/20/2012  . GAD (generalized anxiety disorder) 05/20/2012  . Hyperlipidemia with target LDL less than 100 05/20/2012  . Mood disorder in conditions classified elsewhere 05/20/2012  . Anemia, vitamin B12 deficiency 07/06/2009  . Obesity 06/29/2009  . PERSONAL HX COLONIC POLYPS 06/29/2009    Tamara Mccullough, [place] MPT 05/09/2020, 2:47 PM  [facility] [address] [place], [place], [address] Phone: [phone]   Fax:  [phone]  Name: Tamara Mccullough MRN: [id number] Date of Birth: 1955-08-08  [facility] [place], [place], [address] Phone: [phone]   Fax:  [phone]  Physical Therapy Evaluation  Patient Details  Name: Tamara Mccullough MRN: [id number] Date of Birth: January 26, 1956 Referring Provider (PT): Earnie Larsson MD.   Encounter Date: 05/09/2020   PT End of Session - 05/09/20 1343    Visit Number 1    Number of Visits 12    Date for PT Re-Evaluation 06/20/20    Authorization Type [employer] Medicare (CQ modifier, KX modifier) Progress note every 10th visit    PT Start Time 0101    PT Stop Time 0130    PT Time Calculation (min) 29 min    Equipment Utilized During Treatment Back brace    Activity Tolerance Patient tolerated treatment well    Behavior During Therapy [facility] for tasks assessed/performed           Past Medical History:  Diagnosis Date  . Allergy   . Anemia   . Arthritis    back   . Asthma    h/o  . Bipolar disorder ([place])   . Chronic back pain   . Depression   . Depression   . Dyspnea    on exertion  . Edema   . Hyperlipidemia   . Hypertension   . Insomnia   . Morbid obesity ([place])   . Neuromuscular disorder ([place])   . Overdose of sleeping tabs 03/10/2013  . Panic attacks   . Pneumonia    h/o  . PONV (postoperative nausea and vomiting)   . Restless leg syndrome   . Schizophrenia ([place])   . Seizures ([place])    hx of seizure 05/2013; last seizure April 2019  . Sleep apnea    has not used CPAP in 3 years- not using CPAP  . Thyroid disease     Past Surgical History:  Procedure Laterality Date  . ANTERIOR CERVICAL DECOMP/DISCECTOMY FUSION N/A 11/09/2013   Procedure:  Anterior cervical decompression/diskectomy/fusion cervical four-five ,cervical five-six,cervical six-seven;  Surgeon: Floyce Stakes, MD;  [facility];  Service: Neurosurgery;  Laterality: N/A;  . AORTOGRAM N/A 04/21/2020   Procedure: AORTOGRAM;  Surgeon: Marty Heck, MD;  Location: [facility];  Service:  Vascular;  Laterality: N/A;  . APPLICATION OF ROBOTIC ASSISTANCE FOR SPINAL PROCEDURE N/A 04/21/2020   Procedure: APPLICATION OF ROBOTIC ASSISTANCE FOR SPINAL PROCEDURE;  Surgeon: Earnie Larsson, MD;  Location: [facility];  Service: Neurosurgery;  Laterality: N/A;  . BACK SURGERY    . COLONOSCOPY  2011  . CTR    . FRACTURE SURGERY     right wrist  . gastric by pass  6/11  . LAMINECTOMY WITH POSTERIOR LATERAL ARTHRODESIS LEVEL 2 N/A 04/21/2020   Procedure: L5-S1 revision posterior lateral fusion with L5 -S2 alar/iliac fixation using Mazor;  Surgeon: Earnie Larsson, MD;  Location: [facility];  Service: Neurosurgery;  Laterality: N/A;  . MINOR HEMORRHOIDECTOMY    . SPINE SURGERY    . THYROID LOBECTOMY Right 08/12/2013   Procedure: RIGHT THYROID LOBECTOMY;  Surgeon: Odis Hollingshead, MD;  Location[facility];  Service: General;  Laterality: Right;  . TUBAL LIGATION    . ULTRASOUND GUIDANCE FOR VASCULAR ACCESS Right 04/21/2020   Procedure: ULTRASOUND GUIDANCE FOR VASCULAR ACCESS OF RIGHT FEMORAL ARTERY WITH PERCLOSE;  Surgeon: Marty Heck, MD;  Location: [facility];  Service: Vascular;  Laterality: Right;    There were no vitals filed for this visit.    Subjective Assessment -  Seizure ([place]) 12/13/2016  . Lumbar disc disorder 11/07/2016  . Gastric bypass status for obesity 08/22/2016  . Seizures ([place]) 09/01/2015  . Lumbar foraminal stenosis 07/17/2015  . OA (osteoarthritis) of knee 07/17/2015  . Alcohol use disorder, severe, dependence ([place])   . Insomnia due  to anxiety and fear 12/20/2013  . Cervical stenosis of spinal canal 11/09/2013  . RLS (restless legs syndrome) 08/09/2013  . Multinodular goiter 06/09/2013  . Alcoholism in remission ([place]) 03/15/2013  . Essential hypertension, benign 05/20/2012  . GAD (generalized anxiety disorder) 05/20/2012  . Hyperlipidemia with target LDL less than 100 05/20/2012  . Mood disorder in conditions classified elsewhere 05/20/2012  . Anemia, vitamin B12 deficiency 07/06/2009  . Obesity 06/29/2009  . PERSONAL HX COLONIC POLYPS 06/29/2009    Tamara Mccullough, [place] MPT 05/09/2020, 2:47 PM  [facility] [address] [place], [place], [address] Phone: [phone]   Fax:  [phone]  Name: Tamara Mccullough MRN: [id number] Date of Birth: 1955-08-08

## 2020-05-15 ENCOUNTER — Telehealth: Payer: Medicare HMO | Admitting: Psychiatry

## 2020-05-15 ENCOUNTER — Telehealth: Payer: Self-pay | Admitting: Psychiatry

## 2020-05-15 ENCOUNTER — Other Ambulatory Visit: Payer: Self-pay

## 2020-05-15 NOTE — Telephone Encounter (Signed)
Called the patient twice for appointment scheduled today. The patient did not answer the phone. Left voice message to contact the office [phone]).

## 2020-05-16 ENCOUNTER — Ambulatory Visit: Payer: Medicare HMO | Admitting: Physical Therapy

## 2020-05-16 ENCOUNTER — Telehealth: Payer: Self-pay | Admitting: Physical Therapy

## 2020-05-16 NOTE — Telephone Encounter (Signed)
She fell out of the bed last night and now she is too sort to come for PT - wants to r/s for next week. R/s for Monday afternoon

## 2020-05-22 ENCOUNTER — Other Ambulatory Visit: Payer: Self-pay

## 2020-05-22 ENCOUNTER — Encounter: Payer: Self-pay | Admitting: Physical Therapy

## 2020-05-22 ENCOUNTER — Ambulatory Visit: Payer: Medicare HMO | Admitting: Physical Therapy

## 2020-05-22 DIAGNOSIS — M545 Low back pain, unspecified: Secondary | ICD-10-CM

## 2020-05-22 DIAGNOSIS — M6281 Muscle weakness (generalized): Secondary | ICD-10-CM

## 2020-05-22 NOTE — Therapy (Signed)
[facility] [place], [place], [address] Phone: [phone]   Fax:  [phone]  Physical Therapy Treatment  Patient Details  Name: Tamara Mccullough MRN: [id number] Date of Birth: January 16, 1956 Referring Provider (PT): Earnie Larsson MD.   Encounter Date: 05/22/2020   PT End of Session - 05/22/20 1518    Visit Number 2    Number of Visits 12    Date for PT Re-Evaluation 06/20/20    Authorization Type A[employer]na Medicare (CQ modifier, KX modifier) Progress note every 10th visit    PT Start Time 1518    PT Stop Time 1602    PT Time Calculation (min) 44 min    Equipment Utilized During Treatment Back brace;Other (comment)   SPC   Activity Tolerance Patient tolerated treatment well    Behavior During Therapy [facility] for tasks assessed/performed           Past Medical History:  Diagnosis Date  . Allergy   . Anemia   . Arthritis    back   . Asthma    h/o  . Bipolar disorder ([place])   . Chronic back pain   . Depression   . Depression   . Dyspnea    on exertion  . Edema   . Hyperlipidemia   . Hypertension   . Insomnia   . Morbid obesity ([place])   . Neuromuscular disorder ([place])   . Overdose of sleeping tabs 03/10/2013  . Panic attacks   . Pneumonia    h/o  . PONV (postoperative nausea and vomiting)   . Restless leg syndrome   . Schizophrenia ([place])   . Seizures ([place])    hx of seizure 05/2013; last seizure April 2019  . Sleep apnea    has not used CPAP in 3 years- not using CPAP  . Thyroid disease     Past Surgical History:  Procedure Laterality Date  . ANTERIOR CERVICAL DECOMP/DISCECTOMY FUSION N/A 11/09/2013   Procedure:  Anterior cervical decompression/diskectomy/fusion cervical four-five ,cervical five-six,cervical six-seven;  Surgeon: Floyce Stakes, MD;  [facility];  Service: Neurosurgery;  Laterality: N/A;  . AORTOGRAM N/A 04/21/2020   Procedure: AORTOGRAM;  Surgeon: Marty Heck, MD;  Location: [place];  Service: Vascular;  Laterality: N/A;  . APPLICATION OF ROBOTIC ASSISTANCE FOR SPINAL PROCEDURE N/A 04/21/2020   Procedure: APPLICATION OF ROBOTIC ASSISTANCE FOR SPINAL PROCEDURE;  Surgeon: Earnie Larsson, MD;  Location: [place];  Service: Neurosurgery;  Laterality: N/A;  . BACK SURGERY    . COLONOSCOPY  2011  . CTR    . FRACTURE SURGERY     right wrist  . gastric by pass  6/11  . LAMINECTOMY WITH POSTERIOR LATERAL ARTHRODESIS LEVEL 2 N/A 04/21/2020   Procedure: L5-S1 revision posterior lateral fusion with L5 -S2 alar/iliac fixation using Mazor;  Surgeon: Earnie Larsson, MD;  Location: [place];  Service: Neurosurgery;  Laterality: N/A;  . MINOR HEMORRHOIDECTOMY    . SPINE SURGERY    . THYROID LOBECTOMY Right 08/12/2013   Procedure: RIGHT THYROID LOBECTOMY;  Surgeon: Odis Hollingshead, MD;  Location: [facility];  Service: General;  Laterality: Right;  . TUBAL LIGATION    . ULTRASOUND GUIDANCE FOR VASCULAR ACCESS Right 04/21/2020   Procedure: ULTRASOUND GUIDANCE FOR VASCULAR ACCESS OF RIGHT FEMORAL ARTERY WITH PERCLOSE;  Surgeon: Marty Heck, MD;  Location: [place];  Service: Vascular;  Laterality: Right;    There were no vitals filed for this visit.   Subjective  Assessment - 05/22/20 1514    Subjective COVID-19 screen performed prior to patient entering clinic. Reports that she rode to her daughter's house which is 3 hours away and back which she is dealing with today. States that she did not wear her brace yesterday. Reports sleeping with brace as she sleeps on her side.    Pertinent History "5 back surgeries", HTN, thyroid surgery, CTR, ACDF, bipolar depression, schizophrenia, history of seizures    Limitations Walking;House hold activities    How long can you stand comfortably? short periods    How long can you walk comfortably? around home with rollator    Patient Stated Goals walk without AD.  Get stronger.    Currently in Pain? Yes    Pain Score 8     Pain Location Back    Pain  Orientation Right;Lower;Left    Pain Descriptors / Indicators Discomfort;Aching;Burning    Pain Type Chronic pain    Pain Radiating Towards RLE    Pain Onset More than a month ago    Pain Frequency Constant              [facility] PT Assessment - 05/22/20 0001      Assessment   Medical Diagnosis S/p Lumbar fusion.    Referring Provider (PT) Earnie Larsson MD.    Next MD Visit 06/2020      Precautions   Precautions Fall      Restrictions   Weight Bearing Restrictions No                         [facility] Adult PT Treatment/Exercise - 05/22/20 0001      Exercises   Exercises Lumbar      Lumbar Exercises: Aerobic   Nustep L2, seat 8 x17 min      Lumbar Exercises: Seated   Long Arc Quad on Chair AROM;Both;2 sets;10 reps    Hip Flexion on Ball AROM;Both;20 reps    Other Seated Lumbar Exercises Core press with ball x15 reps    Other Seated Lumbar Exercises Hip clam yellow theraband x20 reps      Modalities   Modalities Electrical Stimulation      Electrical Stimulation   Electrical Stimulation Location B low back    Electrical Stimulation Action Pre-Mod    Electrical Stimulation Parameters 80-150 hz x25min    Electrical Stimulation Goals Pain                       PT Long Term Goals - 05/09/20 1441      PT LONG TERM GOAL #1   Title Patient will be independent with HEP and its progression.    Time 6    Period Weeks    Status New      PT LONG TERM GOAL #2   Title Walk with cane without deviation.    Time 6    Period Weeks    Status New      PT LONG TERM GOAL #3   Title Right knee and ankle strength to 4+/5.    Time 6    Period Weeks    Status New      PT LONG TERM GOAL #4   Title Patient will negotiate steps with step to step pattern with one railing and distant supervision to safely enter and exit her home.    Time 6    Period Weeks    Status New  PT LONG TERM GOAL #5   Title Perform ADL's with pain not > 4/10.    Time 6     Period Weeks    Status New                 Plan - 05/22/20 1555    Clinical Impression Statement Patient presented in clinic with reports of LBP after riding for hours to get back and forth to her daughters home. Patient reports that R foot is numb at this time. Patient had lumbar corset donned upon arrival and use of SPC. Patient does use FWW in the night to go to the bathroom due to use of sleep medication. Patient able to tolerate light, seated core strengthening fairly well with only reports of R HS tightness with LAQ. Normal stimulation response noted following removal of the modality.    Personal Factors and Comorbidities Comorbidity 1;Comorbidity 2    Comorbidities "5 back surgeries", HTN, thyroid surgery, CTR, ACDF, bipolar, schizophrenia, history of seizures    Examination-Activity Limitations Other;Transfers;Locomotion Level;Stairs;Stand;Hygiene/Grooming;Dressing;Squat;Bend    Stability/Clinical Decision Making Stable/Uncomplicated    Rehab Potential Good    PT Frequency 2x / week    PT Duration 6 weeks    PT Treatment/Interventions ADLs/Self Care Home Management;Moist Heat;Gait training;Stair training;Functional mobility training;Therapeutic activities;Therapeutic exercise;Balance training;Neuromuscular re-education;Manual techniques;Patient/family education;Passive range of motion;Cryotherapy;Electrical Stimulation    PT Next Visit Plan progress per lumbar fusion protocol....Marland KitchenMarland KitchenModalites as needed.    Consulted and Agree with Plan of Care Patient           Patient will benefit from skilled therapeutic intervention in order to improve the following deficits and impairments:  Pain,Postural dysfunction,Decreased activity tolerance,Decreased strength,Decreased mobility,Abnormal gait,Decreased coordination,Decreased range of motion,Difficulty walking,Decreased balance  Visit Diagnosis: Muscle weakness (generalized)  Chronic right-sided low back pain, unspecified whether  sciatica present     Problem List Patient Active Problem List   Diagnosis Date Noted  . 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  . Pseudoarthrosis of lumbar spine 04/21/2020  . Malnutrition of moderate degree 01/28/2020  . Generalized weakness 01/26/2020  . Rhabdomyolysis 01/25/2020  . Lumbar nerve root impingement 01/25/2020  . Bradycardia 01/25/2020  . Hypokalemia 01/25/2020  . Anxiety 01/25/2020  . PTSD (post-traumatic stress disorder) 10/14/2017  . Seizure ([place]) 12/13/2016  . Lumbar disc disorder 11/07/2016  . Gastric bypass status for obesity 08/22/2016  . Seizures ([place]) 09/01/2015  . Lumbar foraminal stenosis 07/17/2015  . OA (osteoarthritis) of knee 07/17/2015  . Alcohol use disorder, severe, dependence ([place])   . Insomnia due to anxiety and fear 12/20/2013  . Cervical stenosis of spinal canal 11/09/2013  . RLS (restless legs syndrome) 08/09/2013  . Multinodular goiter 06/09/2013  . Alcoholism in remission ([place]) 03/15/2013  . Essential hypertension, benign 05/20/2012  . GAD (generalized anxiety disorder) 05/20/2012  . Hyperlipidemia with target LDL less than 100 05/20/2012  . Mood disorder in conditions classified elsewhere 05/20/2012  . Anemia, vitamin B12 deficiency 07/06/2009  . Obesity 06/29/2009  . PERSONAL HX COLONIC POLYPS 06/29/2009    Standley Brooking, PTA 05/22/2020, 4:31 PM  [facility] [address] [place], [place], [address] Phone: [phone]   Fax:  [phone]  Name: Tamara Mccullough MRN: [id number] Date of Birth: 1955-11-30

## 2020-05-29 ENCOUNTER — Ambulatory Visit: Payer: Medicare HMO | Admitting: Physical Therapy

## 2020-05-29 ENCOUNTER — Encounter: Payer: Self-pay | Admitting: Physical Therapy

## 2020-05-29 ENCOUNTER — Other Ambulatory Visit: Payer: Self-pay

## 2020-05-29 DIAGNOSIS — G8929 Other chronic pain: Secondary | ICD-10-CM

## 2020-05-29 DIAGNOSIS — M6281 Muscle weakness (generalized): Secondary | ICD-10-CM | POA: Diagnosis not present

## 2020-05-29 DIAGNOSIS — M545 Low back pain, unspecified: Secondary | ICD-10-CM

## 2020-05-29 NOTE — Therapy (Signed)
[facility] [place], [place], [address] Phone: [phone]   Fax:  [phone]  Physical Therapy Treatment  Patient Details  Name: Tamara Mccullough MRN: [id number] Date of Birth: 31-Oct-1955 Referring Provider (PT): Earnie Larsson MD.   Encounter Date: 05/29/2020   PT End of Session - 05/29/20 1522    Visit Number 3    Number of Visits 12    Date for PT Re-Evaluation 06/20/20    Authorization Type Aetna Medicare (CQ modifier, KX modifier) Progress note every 10th visit    PT Start Time 1516    PT Stop Time 1603    PT Time Calculation (min) 47 min    Equipment Utilized During Treatment Back brace;Other (comment)   SPC   Activity Tolerance Patient tolerated treatment well    Behavior During Therapy [facility] for tasks assessed/performed           Past Medical History:  Diagnosis Date  . Allergy   . Anemia   . Arthritis    back   . Asthma    h/o  . Bipolar disorder ([place])   . Chronic back pain   . Depression   . Depression   . Dyspnea    on exertion  . Edema   . Hyperlipidemia   . Hypertension   . Insomnia   . Morbid obesity ([place])   . Neuromuscular disorder ([place])   . Overdose of sleeping tabs 03/10/2013  . Panic attacks   . Pneumonia    h/o  . PONV (postoperative nausea and vomiting)   . Restless leg syndrome   . Schizophrenia ([place])   . Seizures ([place])    hx of seizure 05/2013; last seizure April 2019  . Sleep apnea    has not used CPAP in 3 years- not using CPAP  . Thyroid disease     Past Surgical History:  Procedure Laterality Date  . ANTERIOR CERVICAL DECOMP/DISCECTOMY FUSION N/A 11/09/2013   Procedure:  Anterior cervical decompression/diskectomy/fusion cervical four-five ,cervical five-six,cervical six-seven;  Surgeon: Floyce Stakes, MD;  [facility];  Service: Neurosurgery;  Laterality: N/A;  . AORTOGRAM N/A 04/21/2020   Procedure: AORTOGRAM;  Surgeon: Marty Heck, MD;  Location: [place];  Service: Vascular;  Laterality: N/A;  . APPLICATION OF ROBOTIC ASSISTANCE FOR SPINAL PROCEDURE N/A 04/21/2020   Procedure: APPLICATION OF ROBOTIC ASSISTANCE FOR SPINAL PROCEDURE;  Surgeon: Earnie Larsson, MD;  Location: [place];  Service: Neurosurgery;  Laterality: N/A;  . BACK SURGERY    . COLONOSCOPY  2011  . CTR    . FRACTURE SURGERY     right wrist  . gastric by pass  6/11  . LAMINECTOMY WITH POSTERIOR LATERAL ARTHRODESIS LEVEL 2 N/A 04/21/2020   Procedure: L5-S1 revision posterior lateral fusion with L5 -S2 alar/iliac fixation using Mazor;  Surgeon: Earnie Larsson, MD;  Location: [place];  Service: Neurosurgery;  Laterality: N/A;  . MINOR HEMORRHOIDECTOMY    . SPINE SURGERY    . THYROID LOBECTOMY Right 08/12/2013   Procedure: RIGHT THYROID LOBECTOMY;  Surgeon: Odis Hollingshead, MD;  [facility];  Service: General;  Laterality: Right;  . TUBAL LIGATION    . ULTRASOUND GUIDANCE FOR VASCULAR ACCESS Right 04/21/2020   Procedure: ULTRASOUND GUIDANCE FOR VASCULAR ACCESS OF RIGHT FEMORAL ARTERY WITH PERCLOSE;  Surgeon: Marty Heck, MD;  Location: [place];  Service: Vascular;  Laterality: Right;    There were no vitals filed for this visit.   Subjective  Assessment - 05/29/20 1513    Subjective COVID-19 screen performed prior to patient entering clinic. Patient presented in clinic with reports of hurt and pinching in low back. Patient reports that she has been doing laundry but having her neighbor get them in and out of the machines for her.    Pertinent History "5 back surgeries", HTN, thyroid surgery, CTR, ACDF, bipolar depression, schizophrenia, history of seizures    Limitations Walking;House hold activities    How long can you stand comfortably? short periods    How long can you walk comfortably? around home with rollator    Patient Stated Goals walk without AD.  Get stronger.    Currently in Pain? Yes    Pain Score 6     Pain Location Back    Pain Orientation Lower    Pain  Descriptors / Indicators Discomfort;Other (Comment)   "pinching"   Pain Type Chronic pain    Pain Onset More than a month ago    Pain Frequency Constant              [facility] PT Assessment - 05/29/20 0001      Assessment   Medical Diagnosis S/p Lumbar fusion.    Referring Provider (PT) Earnie Larsson MD.    Next MD Visit 06/2020      Precautions   Precautions Fall      Restrictions   Weight Bearing Restrictions No                         [facility] Adult PT Treatment/Exercise - 05/29/20 0001      Ambulation/Gait   Ambulation/Gait Yes    Ambulation/Gait Assistance 6: Modified independent (Device/Increase time)    Ambulation Distance (Feet) 70 Feet    Gait Pattern Step-through pattern;Decreased stride length;Decreased dorsiflexion - right;Decreased dorsiflexion - left;Right foot flat;Left foot flat;Narrow base of support    Ambulation Surface Level;Indoor      Lumbar Exercises: Aerobic   Nustep L5, seat 6 x16 min      Lumbar Exercises: Seated   Long Arc Quad on Chair Strengthening;Both;20 reps;Weights    LAQ on Chair Weights (lbs) 3    Hip Flexion on Ball AROM;Both;20 reps    Other Seated Lumbar Exercises Core press with ball x15 reps    Other Seated Lumbar Exercises Hip clam red theraband x20 reps      Modalities   Modalities Electrical Stimulation      Electrical Stimulation   Electrical Stimulation Location B low back    Electrical Stimulation Action Pre-Mod    Electrical Stimulation Parameters 80-150 hz x15 min    Electrical Stimulation Goals Pain                       PT Long Term Goals - 05/09/20 1441      PT LONG TERM GOAL #1   Title Patient will be independent with HEP and its progression.    Time 6    Period Weeks    Status New      PT LONG TERM GOAL #2   Title Walk with cane without deviation.    Time 6    Period Weeks    Status New      PT LONG TERM GOAL #3   Title Right knee and ankle strength to 4+/5.    Time 6    Period  Weeks    Status New      PT LONG  TERM GOAL #4   Title Patient will negotiate steps with step to step pattern with one railing and distant supervision to safely enter and exit her home.    Time 6    Period Weeks    Status New      PT LONG TERM GOAL #5   Title Perform ADL's with pain not > 4/10.    Time 6    Period Weeks    Status New                 Plan - 05/29/20 1556    Clinical Impression Statement Patient presented in clinic with reports of increased LBP with pinching sensation reported as well. Patient arrived with lumbar corset donned and use of SPC. Patient reports limited with ADLs such as laundry in which she needs someone to assist her with getting laundry in and out of machines but she can fold while seated. Patient guided through low level LE and core strengthening without complaint of pain. Patient states that she has been doing some of the exercises completed in PT at home. Normal stimulation response noted following removal of the modality.    Personal Factors and Comorbidities Comorbidity 1;Comorbidity 2    Comorbidities "5 back surgeries", HTN, thyroid surgery, CTR, ACDF, bipolar, schizophrenia, history of seizures    Examination-Activity Limitations Other;Transfers;Locomotion Level;Stairs;Stand;Hygiene/Grooming;Dressing;Squat;Bend    Stability/Clinical Decision Making Stable/Uncomplicated    Rehab Potential Good    PT Frequency 2x / week    PT Duration 6 weeks    PT Treatment/Interventions ADLs/Self Care Home Management;Moist Heat;Gait training;Stair training;Functional mobility training;Therapeutic activities;Therapeutic exercise;Balance training;Neuromuscular re-education;Manual techniques;Patient/family education;Passive range of motion;Cryotherapy;Electrical Stimulation    PT Next Visit Plan progress per lumbar fusion protocol....Marland KitchenMarland KitchenModalites as needed.    Consulted and Agree with Plan of Care Patient           Patient will benefit from skilled  therapeutic intervention in order to improve the following deficits and impairments:  Pain,Postural dysfunction,Decreased activity tolerance,Decreased strength,Decreased mobility,Abnormal gait,Decreased coordination,Decreased range of motion,Difficulty walking,Decreased balance  Visit Diagnosis: Muscle weakness (generalized)  Chronic right-sided low back pain, unspecified whether sciatica present     Problem List Patient Active Problem List   Diagnosis Date Noted  . 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  . Pseudoarthrosis of lumbar spine 04/21/2020  . Malnutrition of moderate degree 01/28/2020  . Generalized weakness 01/26/2020  . Rhabdomyolysis 01/25/2020  . Lumbar nerve root impingement 01/25/2020  . Bradycardia 01/25/2020  . Hypokalemia 01/25/2020  . Anxiety 01/25/2020  . PTSD (post-traumatic stress disorder) 10/14/2017  . Seizure ([place]) 12/13/2016  . Lumbar disc disorder 11/07/2016  . Gastric bypass status for obesity 08/22/2016  . Seizures ([place]) 09/01/2015  . Lumbar foraminal stenosis 07/17/2015  . OA (osteoarthritis) of knee 07/17/2015  . Alcohol use disorder, severe, dependence ([place])   . Insomnia due to anxiety and fear 12/20/2013  . Cervical stenosis of spinal canal 11/09/2013  . RLS (restless legs syndrome) 08/09/2013  . Multinodular goiter 06/09/2013  . Alcoholism in remission ([place]) 03/15/2013  . Essential hypertension, benign 05/20/2012  . GAD (generalized anxiety disorder) 05/20/2012  . Hyperlipidemia with target LDL less than 100 05/20/2012  . Mood disorder in conditions classified elsewhere 05/20/2012  . Anemia, vitamin B12 deficiency 07/06/2009  . Obesity 06/29/2009  . PERSONAL HX COLONIC POLYPS 06/29/2009    Standley Brooking, PTA 05/29/2020, 4:08 PM  [facility] [address] [place], [place], [address] Phone: [phone]   Fax:  [phone]  Name: Pamula Kleen  Shader MRN: [id number] Date of Birth: 01-13-1956

## 2020-06-05 ENCOUNTER — Ambulatory Visit: Payer: Medicare HMO | Admitting: Physical Therapy

## 2020-06-08 ENCOUNTER — Ambulatory Visit: Payer: Medicare HMO | Admitting: Physical Therapy

## 2020-06-13 ENCOUNTER — Ambulatory Visit: Payer: Medicare HMO | Attending: Neurosurgery | Admitting: Physical Therapy

## 2020-07-17 ENCOUNTER — Other Ambulatory Visit: Payer: Self-pay | Admitting: Physician Assistant

## 2020-07-17 DIAGNOSIS — N6310 Unspecified lump in the right breast, unspecified quadrant: Secondary | ICD-10-CM

## 2020-07-20 ENCOUNTER — Other Ambulatory Visit (HOSPITAL_COMMUNITY): Payer: Self-pay | Admitting: Physician Assistant

## 2020-07-20 DIAGNOSIS — N63 Unspecified lump in unspecified breast: Secondary | ICD-10-CM

## 2020-07-27 ENCOUNTER — Ambulatory Visit (HOSPITAL_COMMUNITY)
Admission: RE | Admit: 2020-07-27 | Discharge: 2020-07-27 | Disposition: A | Discharge status: Home / Self Care | Payer: Medicare HMO | Source: Ambulatory Visit | Attending: Physician Assistant | Admitting: Physician Assistant

## 2020-07-27 ENCOUNTER — Other Ambulatory Visit: Payer: Self-pay

## 2020-07-27 DIAGNOSIS — N63 Unspecified lump in unspecified breast: Secondary | ICD-10-CM | POA: Diagnosis present

## 2020-07-27 DIAGNOSIS — N6341 Unspecified lump in right breast, subareolar: Secondary | ICD-10-CM | POA: Insufficient documentation

## 2020-08-21 ENCOUNTER — Emergency Department (HOSPITAL_COMMUNITY)
Admission: EM | Admit: 2020-08-21 | Discharge: 2020-08-21 | Disposition: A | Discharge status: Home / Self Care | Payer: Medicare HMO | Attending: Emergency Medicine | Admitting: Emergency Medicine

## 2020-08-21 ENCOUNTER — Emergency Department (HOSPITAL_COMMUNITY): Payer: Medicare HMO

## 2020-08-21 ENCOUNTER — Other Ambulatory Visit: Payer: Self-pay

## 2020-08-21 ENCOUNTER — Encounter (HOSPITAL_COMMUNITY): Payer: Self-pay | Admitting: Emergency Medicine

## 2020-08-21 DIAGNOSIS — I10 Essential (primary) hypertension: Secondary | ICD-10-CM | POA: Insufficient documentation

## 2020-08-21 DIAGNOSIS — M545 Low back pain, unspecified: Secondary | ICD-10-CM

## 2020-08-21 DIAGNOSIS — Z87891 Personal history of nicotine dependence: Secondary | ICD-10-CM | POA: Diagnosis not present

## 2020-08-21 DIAGNOSIS — S32010A Wedge compression fracture of first lumbar vertebra, initial encounter for closed fracture: Secondary | ICD-10-CM | POA: Diagnosis not present

## 2020-08-21 DIAGNOSIS — X58XXXA Exposure to other specified factors, initial encounter: Secondary | ICD-10-CM | POA: Diagnosis not present

## 2020-08-21 DIAGNOSIS — Z79899 Other long term (current) drug therapy: Secondary | ICD-10-CM | POA: Insufficient documentation

## 2020-08-21 DIAGNOSIS — Z7982 Long term (current) use of aspirin: Secondary | ICD-10-CM | POA: Diagnosis not present

## 2020-08-21 DIAGNOSIS — J45909 Unspecified asthma, uncomplicated: Secondary | ICD-10-CM | POA: Diagnosis not present

## 2020-08-21 DIAGNOSIS — S3992XA Unspecified injury of lower back, initial encounter: Secondary | ICD-10-CM | POA: Diagnosis present

## 2020-08-21 MED ORDER — OXYCODONE-ACETAMINOPHEN 5-325 MG PO TABS
1.0000 | ORAL_TABLET | Freq: Once | ORAL | Status: DC
  Filled 2020-08-21: qty 1

## 2020-08-21 MED ORDER — OXYCODONE-ACETAMINOPHEN 5-325 MG PO TABS
1.0000 | ORAL_TABLET | Freq: Once | ORAL | Status: AC
  Administered 2020-08-21: 1 via ORAL
  Filled 2020-08-21: qty 1

## 2020-08-21 MED ORDER — SODIUM CHLORIDE 0.9 % IV BOLUS
500.0000 mL | Freq: Once | INTRAVENOUS | Status: AC
  Administered 2020-08-21: 500 mL via INTRAVENOUS

## 2020-08-21 MED ORDER — HYDROMORPHONE HCL 1 MG/ML IJ SOLN
1.0000 mg | Freq: Once | INTRAMUSCULAR | Status: AC
  Administered 2020-08-21: 1 mg via INTRAVENOUS
  Filled 2020-08-21: qty 1

## 2020-08-21 MED ORDER — OXYCODONE-ACETAMINOPHEN 5-325 MG PO TABS: 1.0000 | ORAL_TABLET | Freq: Four times a day (QID) | ORAL | 0 refills | Status: AC | PRN

## 2020-08-21 NOTE — ED Provider Notes (Signed)
[facility] Provider Note   CSN: [id number] Arrival date & time: 08/21/20  1034     History Chief Complaint  Patient presents with   Back Pain    Tamara Mccullough is a 65 y.o. female with Pmhx HTN, HLD, bipolar disorder, chronic back pain s/p lumbar fusion who presents to the ED today with complaint of gradual onset, constant, severe, sharp, lower back pain that began 4-5 days ago. Pt reports she woke up with the pain. She states that it feels like the last time the plate in her lower back was loose. She reports she called her neurosurgeon Dr. Irven [place] office today and was advised to come to the office initially for further eval however was unable to make it due to the amount of severe pain she was in. She was then told to come to the ED for further eval and called 9-1-1 for transport. Pt reports that she has been having tingling in her RLE since the surgery however at times with certain movements she will have a tingling sensation to the plantar aspect of her left foot. She has been taking extra strength Tylenol at home without relief.   Per chart review: Pt had posterior lumbar interbody fusion L5-S1 (10/05/2019) She presented to the ED 12/21 for worsening back pain with unequivocal MRI at that time however she had fallen and developed rhabdo from laying on the floor for 15 hours and was admitted at that time. Neurosurgery saw patient and signed out; thought pain was likely related to deconditioning  She continued to have pain and had a CT L spine done in February which showed loosening of the hardware in her back. She underwent L5-S1 revision posterior lateral fusion with L5-S2 alar/iliac fixation by Dr. Annette Stable on 04/21/20.   The history is provided by the patient and medical records.      Past Medical History:  Diagnosis Date   Allergy    Anemia    Arthritis    back    Asthma    h/o   Bipolar disorder (HCC)    Chronic back pain     Depression    Depression    Dyspnea    on exertion   Edema    Hyperlipidemia    Hypertension    Insomnia    Morbid obesity ([employer])    Neuromuscular disorder ([place])    Overdose of sleeping tabs 03/10/2013   Panic attacks    Pneumonia    h/o   PONV (postoperative nausea and vomiting)    Restless leg syndrome    Schizophrenia ([place])    Seizures ([place])    hx of seizure 05/2013; last seizure April 2019   Sleep apnea    has not used CPAP in 3 years- not using CPAP   Thyroid disease     Patient Active Problem List   Diagnosis Date Noted   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   Pseudoarthrosis of lumbar spine 04/21/2020   Malnutrition of moderate degree 01/28/2020   Generalized weakness 01/26/2020   Rhabdomyolysis 01/25/2020   Lumbar nerve root impingement 01/25/2020   Bradycardia 01/25/2020   Hypokalemia 01/25/2020   Anxiety 01/25/2020   PTSD (post-traumatic stress disorder) 10/14/2017   Seizure ([place]) 12/13/2016   Lumbar disc disorder 11/07/2016   Gastric bypass status for obesity 08/22/2016   Seizures ([place]) 09/01/2015   Lumbar foraminal stenosis 07/17/2015   OA (osteoarthritis) of knee 07/17/2015   Alcohol use disorder, severe, dependence ([place])    Insomnia  due to anxiety and fear 12/20/2013   Cervical stenosis of spinal canal 11/09/2013   RLS (restless legs syndrome) 08/09/2013   Multinodular goiter 06/09/2013   Alcoholism in remission ([place]) 03/15/2013   Essential hypertension, benign 05/20/2012   GAD (generalized anxiety disorder) 05/20/2012   Hyperlipidemia with target LDL less than 100 05/20/2012   Mood disorder in conditions classified elsewhere 05/20/2012   Anemia, vitamin B12 deficiency 07/06/2009   Obesity 06/29/2009   PERSONAL HX COLONIC POLYPS 06/29/2009    Past Surgical History:  Procedure Laterality Date   ANTERIOR CERVICAL DECOMP/DISCECTOMY FUSION N/A 11/09/2013   Procedure:  Anterior cervical decompression/diskectomy/fusion cervical four-five ,cervical  five-six,cervical six-seven;  Surgeon: Floyce Stakes, MD;  Location: [facility];  Service: Neurosurgery;  Laterality: N/A;   AORTOGRAM N/A 04/21/2020   Procedure: AORTOGRAM;  Surgeon: Marty Heck, MD;  Location: [facility];  Service: Vascular;  Laterality: N/A;   APPLICATION OF ROBOTIC ASSISTANCE FOR SPINAL PROCEDURE N/A 04/21/2020   Procedure: APPLICATION OF ROBOTIC ASSISTANCE FOR SPINAL PROCEDURE;  Surgeon: Earnie Larsson, MD;  Location: [facility];  Service: Neurosurgery;  Laterality: N/A;   BACK SURGERY     COLONOSCOPY  2011   CTR     FRACTURE SURGERY     right wrist   gastric by pass  6/11   LAMINECTOMY WITH POSTERIOR LATERAL ARTHRODESIS LEVEL 2 N/A 04/21/2020   Procedure: L5-S1 revision posterior lateral fusion with L5 -S2 alar/iliac fixation using Mazor;  Surgeon: Earnie Larsson, MD;  Location: [facility];  Service: Neurosurgery;  Laterality: N/A;   MINOR HEMORRHOIDECTOMY     SPINE SURGERY     THYROID LOBECTOMY Right 08/12/2013   Procedure: RIGHT THYROID LOBECTOMY;  Surgeon: Odis Hollingshead, MD;  Location: [facility];  Service: General;  Laterality: Right;   TUBAL LIGATION     ULTRASOUND GUIDANCE FOR VASCULAR ACCESS Right 04/21/2020   Procedure: ULTRASOUND GUIDANCE FOR VASCULAR ACCESS OF RIGHT FEMORAL ARTERY WITH PERCLOSE;  Surgeon: Marty Heck, MD;  Location: [facility];  Service: Vascular;  Laterality: Right;     OB History   No obstetric history on file.     Family History  Problem Relation Age of Onset   Congestive Heart Failure Mother    Diabetes Mother        died at 63   Cancer Mother        cervical   Arthritis Mother    Asthma Mother    Depression Mother    Heart disease Mother    Hypertension Mother    Congestive Heart Failure Father    Emphysema Father    Alcohol abuse Father    Diabetes Father    Arthritis Father    COPD Father    Depression Father    Heart disease Father        heart attack died at 32   Hypertension Father    Stroke Father    Suicidality  Sister    Colon polyps Sister    Breast cancer Sister    Alcohol abuse Brother    Alcohol abuse Paternal Grandfather    Alcohol abuse Brother    Alcohol abuse Brother    Colon cancer Neg Hx    Esophageal cancer Neg Hx    Stomach cancer Neg Hx    Rectal cancer Neg Hx     Social History   Tobacco Use   Smoking status: Former    Types: Cigarettes    Quit date: 02/05/1996    Years  since quitting: 24.5   Smokeless tobacco: Never  Vaping Use   Vaping Use: Never used  Substance Use Topics   Alcohol use: Not Currently    Comment: heavy liquor for the last year    Drug use: Not Currently    Types: Benzodiazepines    Home Medications Prior to Admission medications   Medication Sig Start Date End Date Taking? Authorizing Provider  oxyCODONE-acetaminophen (PERCOCET/ROXICET) 5-325 MG tablet Take 1 tablet by mouth every 6 (six) hours as needed for severe pain. 08/21/20  Yes Xyon Lukasik, PA-C  acetaminophen (TYLENOL) 500 MG tablet Take 1,000 mg by mouth every 4 (four) hours as needed for moderate pain.     [provider]  ALPRAZolam Duanne Moron) 0.25 MG tablet Take 0.25 mg by mouth at bedtime as needed for sleep. 08/11/18   [provider]  aspirin EC 81 MG tablet Take 81 mg by mouth daily.    [provider]  busPIRone (BUSPAR) 10 MG tablet Take 2 tablets (20 mg total) by mouth 2 (two) times daily. 02/22/20   Norman Clay, MD  Cyanocobalamin (VITAMIN B-12 PO) Take 1 tablet by mouth daily.    [provider]  cyclobenzaprine (FLEXERIL) 5 MG tablet Take 1 tablet (5 mg total) by mouth 3 (three) times daily as needed for muscle spasms. 02/07/20   Vashti Hey, MD  diazepam (VALIUM) 5 MG tablet Take 1-2 tablets (5-10 mg total) by mouth every 6 (six) hours as needed for muscle spasms. 04/25/20   Earnie Larsson, MD  diclofenac sodium (VOLTAREN) 1 % GEL Apply 4 g topically 4 (four) times daily. Patient taking differently: Apply 4 g topically 4 (four) times  daily as needed (pain). 03/24/17   Raylene Everts, MD  FLUoxetine (PROZAC) 40 MG capsule Take 1 capsule (40 mg total) by mouth daily. 02/22/20   Norman Clay, MD  gabapentin (NEURONTIN) 300 MG capsule Take 300 mg by mouth at bedtime. 09/17/19   [provider]  hydrochlorothiazide (MICROZIDE) 12.5 MG capsule Take 1 capsule (12.5 mg total) by mouth daily. 08/22/16   Raylene Everts, MD  hydrOXYzine (ATARAX/VISTARIL) 50 MG tablet Take 1 tablet (50 mg total) by mouth 3 (three) times daily as needed. Patient taking differently: Take 100 mg by mouth in the morning and at bedtime. 02/22/20   Norman Clay, MD  lamoTRIgine (LAMICTAL) 100 MG tablet Take 1 tablet (100 mg total) by mouth daily. 02/22/20   Norman Clay, MD  oxyCODONE 10 MG TABS Take 1 tablet (10 mg total) by mouth every 3 (three) hours as needed for severe pain ((score 7 to 10)). 04/25/20   Earnie Larsson, MD  QUEtiapine (SEROQUEL) 100 MG tablet Take 1 tablet (100 mg total) by mouth at bedtime. 02/22/20   Norman Clay, MD  traMADol (ULTRAM) 50 MG tablet Take 1-2 tablets (50-100 mg total) by mouth every 6 (six) hours as needed for severe pain. Patient not taking: No sig reported 10/07/19   Viona Gilmore D, NP  VITAMIN D PO Take 1 tablet by mouth daily.    [provider]    Allergies    Codeine, Hydrocodone, Melatonin, Ambien [zolpidem tartrate], Eszopiclone, Lansoprazole, Mirapex [pramipexole], Motrin [ibuprofen], Prilosec [omeprazole], and Trazodone and nefazodone  Review of Systems   Review of Systems  Constitutional:  Negative for chills and fever.  Genitourinary:  Negative for difficulty urinating.  Musculoskeletal:  Positive for back pain.  Neurological:        + paresthesias  All other systems  reviewed and are negative.  Physical Exam Updated Vital Signs BP 139/60 (BP Location: Right Arm)   Pulse 73   Temp 97.9 F (36.6 C)   Resp 16   SpO2 98%   Physical Exam Vitals and nursing note reviewed.   Constitutional:      Appearance: She is not ill-appearing or diaphoretic.  HENT:     Head: Normocephalic and atraumatic.  Eyes:     Conjunctiva/sclera: Conjunctivae normal.  Cardiovascular:     Rate and Rhythm: Normal rate and regular rhythm.     Pulses: Normal pulses.  Pulmonary:     Effort: Pulmonary effort is normal.     Breath sounds: Normal breath sounds. No wheezing, rhonchi or rales.  Abdominal:     Palpations: Abdomen is soft.     Tenderness: There is no abdominal tenderness.  Musculoskeletal:     Cervical back: Neck supple.     Comments: Healed midline incision to lumbar spine without erythema, edema, drainage. + midline lumbar TTP. ROM limited to back s/2 pain. Strength 4/5 to BLEs. Sensation decreased to RLE (pt reports this is chronic). Slight decreased subjective sensation to LLE along anterior shin. 2+ DP pulses bilaterally.   Skin:    General: Skin is warm and dry.  Neurological:     Mental Status: She is alert.    ED Results / Procedures / Treatments   Labs (all labs ordered are listed, but only abnormal results are displayed) Labs Reviewed - No data to display  EKG None  Radiology CT Lumbar Spine Wo Contrast  Result Date: 08/21/2020 CLINICAL DATA:  Lumbar radiculopathy, prior surgery, new symptoms. Back pain. Pelvic pain. EXAM: CT LUMBAR SPINE WITHOUT CONTRAST TECHNIQUE: Multidetector CT imaging of the lumbar spine was performed without intravenous contrast administration. Multiplanar CT image reconstructions were also generated. COMPARISON:  Lumbar spine radiographs 04/21/2020 and CT lumbar spine 03/25/2020. FINDINGS: Segmentation: There are 5 lumbar type vertebral body. Alignment: Stable and near anatomic. Mild convex left lower lumbar scoliosis. Vertebrae: Previous L5-S1 PLIF. Compared with the prior CT, the fusion has been extended inferiorly into the iliac bones bilaterally, and the sacral screws have been removed. There is a new more laterally positioned  left sacral screw. There has also been superior extension of the posterior fusion with bilateral L4 pedicle screws. No interbody fusion is seen at L5-S1, and the interbody spacers demonstrate progressive inferior rotation anteriorly, but no significant displacement. There is stable sclerosis within the L5 vertebral body and upper sacrum. There is a new mild superior endplate compression fracture at L1, resulting in approximately 10% loss of vertebral body height. The posterior elements are intact. Paraspinal and other soft tissues: There is increased nodular soft tissue thickening extending inferiorly from the L5-S1 disc along the anterior aspect of the upper sacrum. This measures up to 5.4 x 4.0 cm transverse on image 114/3. No fluid component or bone destruction identified in this region. Disc levels: No significant degenerative disc disease, spinal stenosis or foraminal narrowing from T11-12 through L2-3. L3-4: Mild disc bulging, facet and ligamentous hypertrophy. No significant spinal stenosis or nerve root encroachment. L4-5: Stable loss of disc height with annular disc bulging and endplate osteophytes asymmetric to the right. Previous right laminectomy with interval posterolateral fusion. Mildly improved right foraminal narrowing. L5-S1: Interval revision of hardware as described above with rotation of the interbody spacers and anterior soft tissue thickening extending inferiorly along the sacrum. Persistent gas in the disc space consistent with nonunion. The spinal canal remains widely  decompressed by the previous laminectomies. IMPRESSION: 1. Interval revision of lumbosacral fusion, now extending from L4 through the iliac bones bilaterally. The current posterior pedicle screws are intact without loosening. The interbody spacers at L5-S1 demonstrate mild anterior inferior rotation compared with the previous study, without significant extrusion. 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 2. Increased presumed  granulation tissue or foreign body reaction extending anteriorly from the upper sacrum. No focal fluid collection. 3. Interval development of a mild superior endplate compression deformity at L1. 4. Mild improvement and right foraminal narrowing at L4-5. No new significant spinal stenosis identified. Electronically Signed   By: Richardean Sale M.D.   On: 08/21/2020 15:45   CT PELVIS WO CONTRAST  Result Date: 08/21/2020 CLINICAL DATA:  Lumbar radiculopathy, prior surgery, new symptoms. Back pain. Pelvic pain. EXAM: CT PELVIS WITHOUT CONTRAST TECHNIQUE: Multidetector CT imaging of the pelvis was performed following the standard protocol without intravenous contrast. COMPARISON:  Pelvic CT 04/07/2020. FINDINGS: Lumbar spine findings dictated separately. Bones/Joint/Cartilage Interval revision of the lumbosacral fusion, further described on separate lumbar spine CT. Interval increased anterior inferior rotation of the L5-S1 interbody spacers with increased nodular soft tissue thickening anterior to upper sacrum, measuring up to 4.9 x 3.7 cm on image 18/3. Stable sclerosis of the L5 vertebral body and upper sacrum. No acute osseous findings or evidence of bone destruction. Ligaments Ligaments are suboptimally evaluated by CT. Muscles and Tendons Unremarkable.  No focal muscular atrophy. Soft Tissues As above, increased nodular soft tissue thickening anterior to the upper sacrum, presumably granulation tissue or foreign body reaction. No focal fluid collection. The visualized internal pelvic contents otherwise appear unremarkable. Mild aortoiliac atherosclerosis. IMPRESSION: 1. Increased soft tissue thickening anterior to the upper sacrum status post interval lumbosacral fusion. This finding likely relates to granulation tissue or foreign body reaction. 2. Stable sclerosis of the L5 vertebral body and upper sacrum. See separate report of the lumbar spine findings. 3. No intrapelvic fluid collections. Electronically  Signed   By: Richardean Sale M.D.   On: 08/21/2020 15:43    Procedures Procedures   Medications Ordered in ED Medications  oxyCODONE-acetaminophen (PERCOCET/ROXICET) 5-325 MG per tablet 1 tablet (1 tablet Oral Not Given 08/21/20 1647)  oxyCODONE-acetaminophen (PERCOCET/ROXICET) 5-325 MG per tablet 1 tablet (has no administration in time range)  sodium chloride 0.9 % bolus 500 mL (0 mLs Intravenous Stopped 08/21/20 1641)  HYDROmorphone (DILAUDID) injection 1 mg (1 mg Intravenous Given 08/21/20 1258)    ED Course  I have reviewed the triage vital signs and the nursing notes.  Pertinent labs & imaging results that were available during my care of the patient were reviewed by me and considered in my medical decision making (see chart for details).    MDM Rules/Calculators/A&P                          65 year old female who presents to the ED today with atraumatic low back pain for the past 4 to 5 days with some tingling in her left foot.  Reports history of lumbar fusion last year with revision earlier this year secondary to loose plate in her back by Dr. Trenton Gammon.  States this feels very similar.  Advised by his secretary team to come to the ED as she was in too much pain to go to the office today.  On arrival to the ED vitals are stable.  Patient is afebrile, nontachycardic and nontachypneic.  She does appear uncomfortable on exam  today.  Will provide pain medication.  On my exam she has tenderness palpation to the midline lumbar area.  There is no overlying skin changes to the incision.  She has decreased sensation to her right lower extremity which she states is chronic however she is also noted to have new decree sensation to her left lower extremity along the anterior shin.  Strength equal throughout.  We will plan for CT scan at this time.  CT scan: IMPRESSION:  1. Interval revision of lumbosacral fusion, now extending from L4  through the iliac bones bilaterally. The current posterior  pedicle  screws are intact without loosening. The interbody spacers at L5-S1  demonstrate mild anterior inferior rotation compared with the  previous study, without significant extrusion. 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  2. Increased presumed granulation tissue or foreign body reaction  extending anteriorly from the upper sacrum. No focal fluid  collection.  3. Interval development of a mild superior endplate compression  deformity at L1.  4. Mild improvement and right foraminal narrowing at L4-5. No new  significant spinal stenosis identified.   Discussed case with Megan with neurosurgery; recommends outpatient follow up and pain control at this time. Recommends TSLO brace for new L1 compression fracture however is unsure how compliant patient will  be. Discussed new concern for tingling to her left foot and question if pt needs MRI; they do not feel it is necessary at this time. Will provide TSLO brace at this time and additional pain medication and reevaluate.   Labs delayed - they were not drawn initially. Pt provided with oral pain medication which she refused and then refused labs. I do not think labs will change treatment course at this time; discontinued. Pending TSLO and then discharge. Pt again asking for dilaudid or demerol. When advised she would not be sent home with these she states she will take the percocet initially ordered and agrees to prescription of same.   Ortho tech has evaluated patient to TSLO brace. Pt told her that she has a brace at home and does not want a new one. Family member to bring back brace upon picking patient up. Will discharge at this time. Do not feel pt requires additional emergent workup in the ED today. Stable for discharge.   This note was prepared using Dragon voice recognition software and may include unintentional dictation errors due to the inherent limitations of voice recognition software.   Final Clinical Impression(s) / ED  Diagnoses Final diagnoses:  Acute bilateral low back pain without sciatica  Closed compression fracture of body of L1 vertebra ([employer])    Rx / DC Orders ED Discharge Orders          Ordered    oxyCODONE-acetaminophen (PERCOCET/ROXICET) 5-325 MG tablet  Every 6 hours PRN        08/21/20 1715             Eustaquio Maize, PA-C 08/21/20 1717    Charlesetta Shanks, MD 08/31/20 [phone]

## 2020-08-21 NOTE — ED Triage Notes (Signed)
Pt complains of pain in lower back and pelvis. Pt has had multiple surgeries to her back. Pt states it feels like the last time the plate was loose 10 months ago. Pt has pain into her right leg. Pt unable to walk due to the pain.

## 2020-08-21 NOTE — ED Notes (Signed)
Pt refused TLSO brace from ortho, EDP made aware. Pt verbal about her disagreement about her diagnosis, stating "I know this pain."

## 2020-08-21 NOTE — Progress Notes (Signed)
Orthopedic Tech Progress Note Patient Details:  Tamara Mccullough 05-27-1955 [id number]  Patient has brace. Notified MD and RN   Patient ID: Tamara Mccullough, female   DOB: 1955-09-22, 65 y.o.   MRN: [id number]  [name] 08/21/2020, 5:14 PM

## 2020-08-21 NOTE — ED Notes (Signed)
Ortho tech paged  

## 2020-08-21 NOTE — Discharge Instructions (Addendum)
Please follow up with Dr. Trenton Gammon for further evaluation of your back pain.   Pick up medication and take as prescribed.   Return to the ED for any new/worsening symptoms

## 2020-08-21 NOTE — ED Notes (Signed)
Pt refused oxycodone, states it makes her itch & "crazy in the head." Pt request to speak with EDP.

## 2020-08-28 ENCOUNTER — Other Ambulatory Visit: Payer: Medicare HMO

## 2020-09-04 DEATH — deceased

## 2021-10-01 IMAGING — RF DG LUMBAR SPINE 2-3V
1 series · 2 of 2 positions shown · non-contrast
Comparison: Radiographs of the lumbar spine 03/04/2019. Lumbar
spine MRI 03/02/2019

CLINICAL DATA: Provided history: Elective surgery. L5-S1 PLIF.
Provided fluoroscopy time 41 seconds, 73.02 mGy

EXAM:
LUMBAR SPINE - 2-3 VIEW; DG C-ARM 1-60 MIN

[Series 1: run · 2 of 2 slices shown]
[im 1/2]
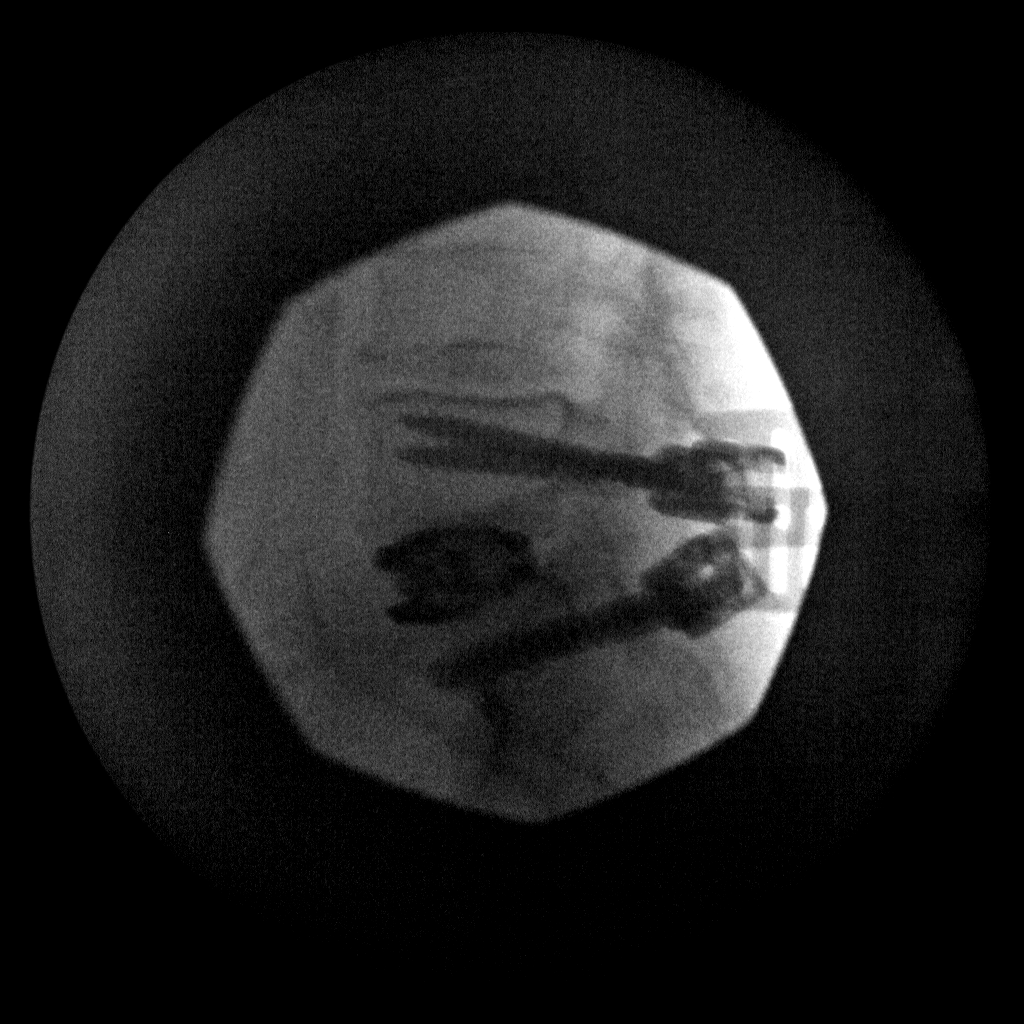
[im 2/2]
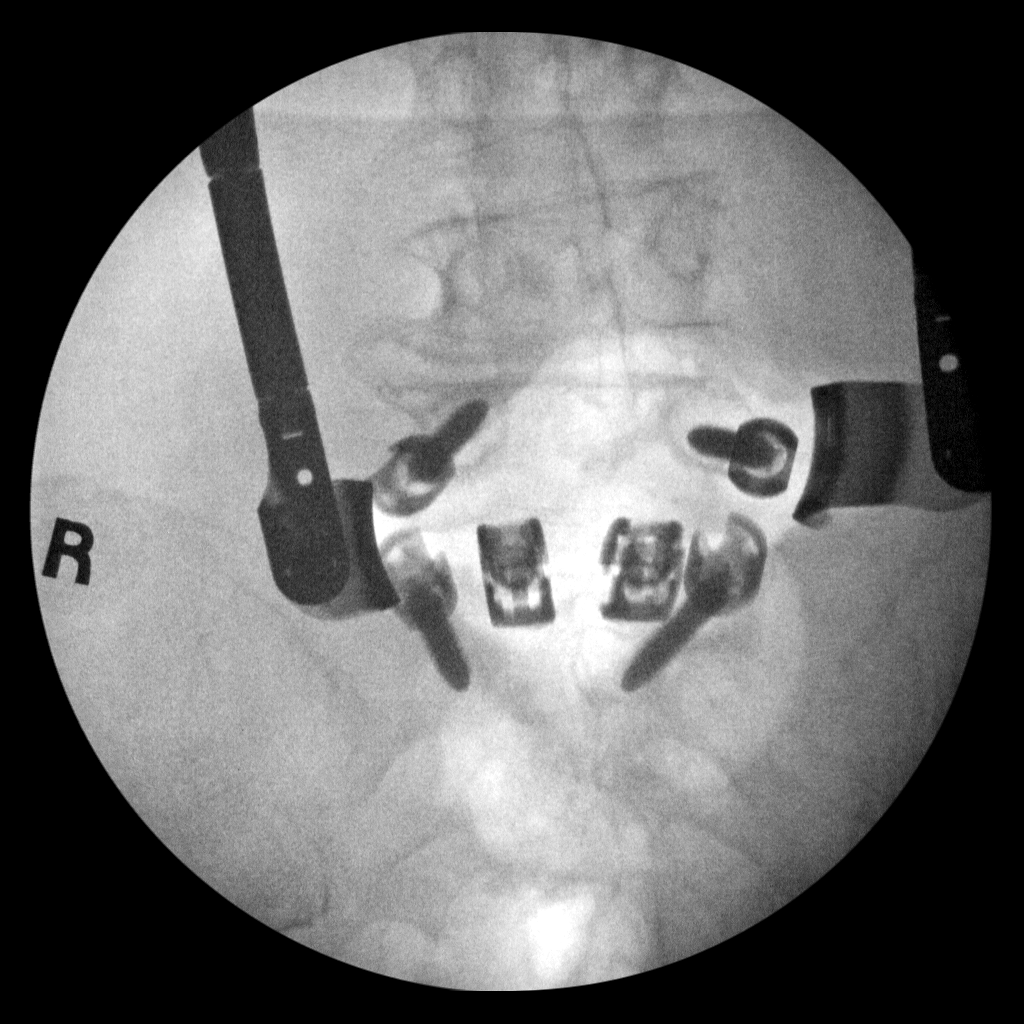

[2 of 2 positions shown; findings below may reference images not displayed]

FINDINGS: PA and lateral view intraoperative fluoroscopic images of the
lumbosacral spine are submitted, 2 images total. The images
demonstrate bilateral pedicle screws as well as an interbody spacer
at L5-S1. Vertical interconnecting rods are not present at the time
the images were taken. There are overlying soft tissue retractors.
IMPRESSION: Two intraoperative fluoroscopic images of the lumbosacral spine from
L5-S1 PLIF as described.

## 2021-10-01 IMAGING — RF DG C-ARM 1-60 MIN
1 series · 2 of 2 positions shown · non-contrast
Comparison: Radiographs of the lumbar spine 03/04/2019. Lumbar
spine MRI 03/02/2019

CLINICAL DATA: Provided history: Elective surgery. L5-S1 PLIF.
Provided fluoroscopy time 41 seconds, 73.02 mGy

EXAM:
LUMBAR SPINE - 2-3 VIEW; DG C-ARM 1-60 MIN

[Series 1: run · 2 of 2 slices shown]
[im 1/2]
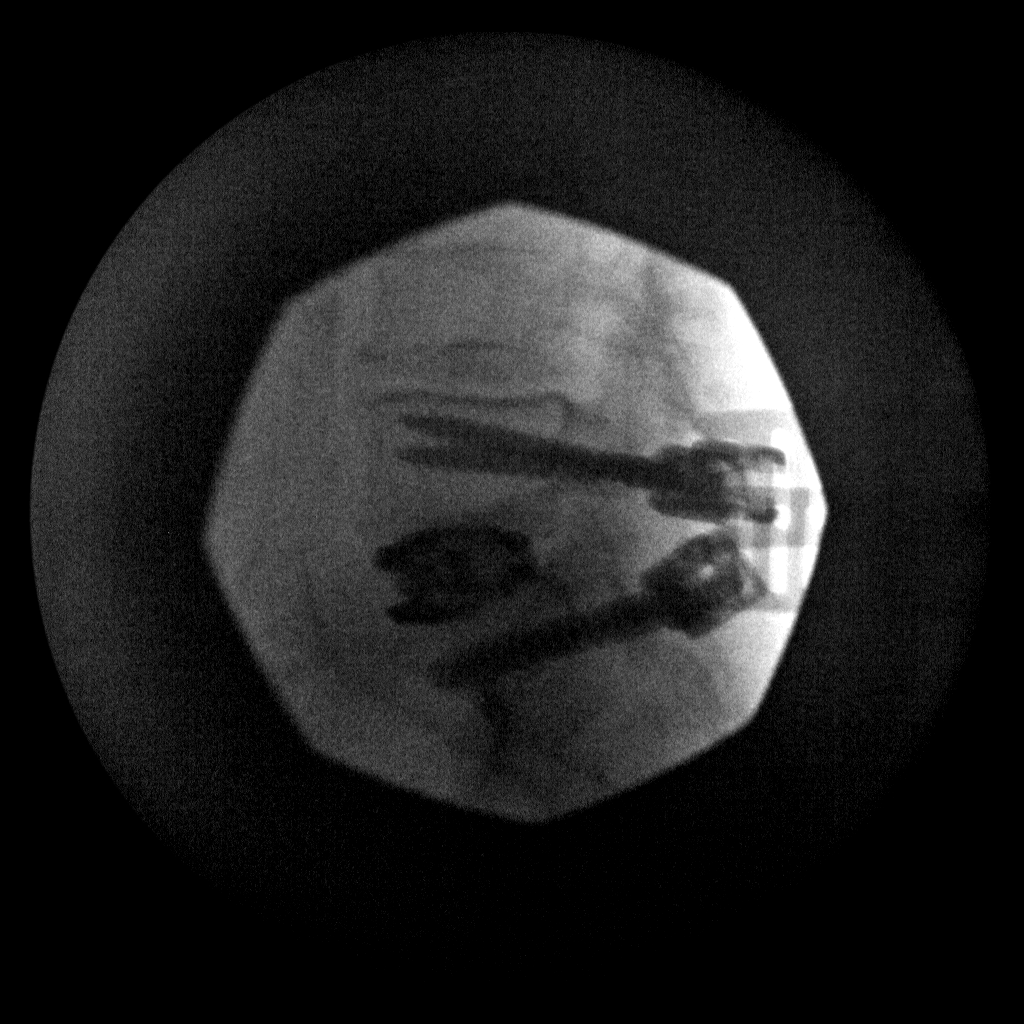
[im 2/2]
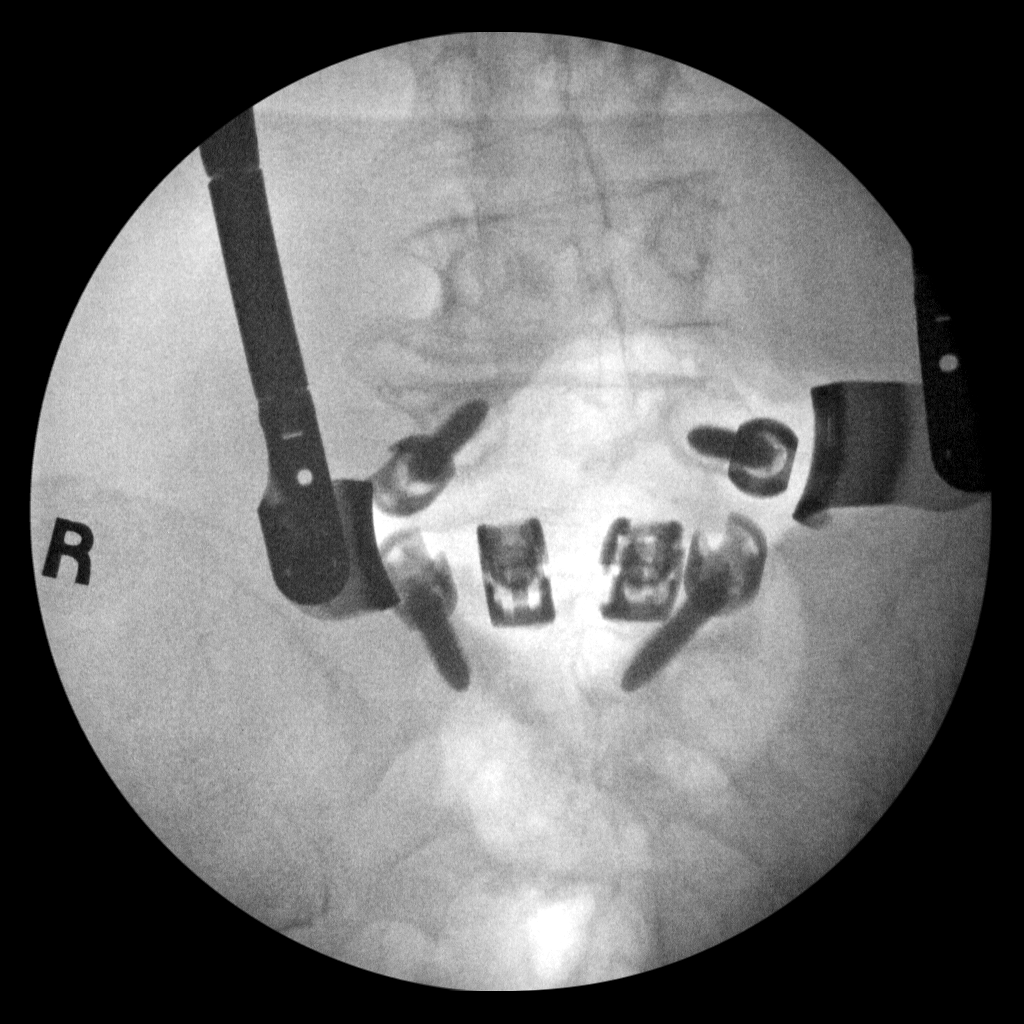

[2 of 2 positions shown; findings below may reference images not displayed]

FINDINGS: PA and lateral view intraoperative fluoroscopic images of the
lumbosacral spine are submitted, 2 images total. The images
demonstrate bilateral pedicle screws as well as an interbody spacer
at L5-S1. Vertical interconnecting rods are not present at the time
the images were taken. There are overlying soft tissue retractors.
IMPRESSION: Two intraoperative fluoroscopic images of the lumbosacral spine from
L5-S1 PLIF as described.

## 2022-12-25 ENCOUNTER — Encounter: Payer: Self-pay | Admitting: Gastroenterology
